# Patient Record
Sex: Female | Born: 1940 | Race: Black or African American | Hispanic: No | Smoking: Former smoker
Health system: Southern US, Community
[De-identification: ages and names within clinical notes are randomized; demographics above are authoritative.]

## PROBLEM LIST (undated history)

## (undated) DIAGNOSIS — J189 Pneumonia, unspecified organism: Secondary | ICD-10-CM

## (undated) DIAGNOSIS — E559 Vitamin D deficiency, unspecified: Secondary | ICD-10-CM

## (undated) DIAGNOSIS — D649 Anemia, unspecified: Secondary | ICD-10-CM

## (undated) DIAGNOSIS — M1990 Unspecified osteoarthritis, unspecified site: Secondary | ICD-10-CM

## (undated) DIAGNOSIS — I700 Atherosclerosis of aorta: Secondary | ICD-10-CM

## (undated) DIAGNOSIS — J302 Other seasonal allergic rhinitis: Secondary | ICD-10-CM

## (undated) DIAGNOSIS — C50919 Malignant neoplasm of unspecified site of unspecified female breast: Secondary | ICD-10-CM

## (undated) DIAGNOSIS — I10 Essential (primary) hypertension: Secondary | ICD-10-CM

## (undated) DIAGNOSIS — IMO0002 Reserved for concepts with insufficient information to code with codable children: Secondary | ICD-10-CM

## (undated) DIAGNOSIS — IMO0001 Reserved for inherently not codable concepts without codable children: Secondary | ICD-10-CM

## (undated) DIAGNOSIS — C569 Malignant neoplasm of unspecified ovary: Secondary | ICD-10-CM

## (undated) HISTORY — DX: Malignant neoplasm of unspecified site of unspecified female breast: C50.919

## (undated) HISTORY — PX: APPENDECTOMY: SHX54

## (undated) HISTORY — DX: Malignant neoplasm of unspecified ovary: C56.9

## (undated) HISTORY — DX: Vitamin D deficiency, unspecified: E55.9

## (undated) HISTORY — PX: TONSILLECTOMY: SUR1361

## (undated) HISTORY — DX: Other seasonal allergic rhinitis: J30.2

## (undated) HISTORY — PX: BLADDER SURGERY: SHX569

## (undated) HISTORY — PX: BREAST LUMPECTOMY: SHX2

## (undated) HISTORY — DX: Atherosclerosis of aorta: I70.0

---

## 1998-04-05 ENCOUNTER — Other Ambulatory Visit: Admission: RE | Admit: 1998-04-05 | Discharge: 1998-04-05 | Payer: Self-pay

## 1999-03-14 ENCOUNTER — Other Ambulatory Visit: Admission: RE | Admit: 1999-03-14 | Discharge: 1999-03-14 | Payer: Self-pay | Admitting: Internal Medicine

## 1999-06-13 ENCOUNTER — Encounter: Payer: Self-pay | Admitting: Internal Medicine

## 1999-06-13 ENCOUNTER — Ambulatory Visit (HOSPITAL_COMMUNITY): Admission: RE | Admit: 1999-06-13 | Discharge: 1999-06-13 | Payer: Self-pay | Admitting: Internal Medicine

## 2000-02-06 ENCOUNTER — Emergency Department (HOSPITAL_COMMUNITY): Admission: EM | Admit: 2000-02-06 | Discharge: 2000-02-06 | Payer: Self-pay | Admitting: Emergency Medicine

## 2000-02-07 ENCOUNTER — Encounter: Payer: Self-pay | Admitting: Emergency Medicine

## 2000-02-16 ENCOUNTER — Ambulatory Visit (HOSPITAL_COMMUNITY): Admission: RE | Admit: 2000-02-16 | Discharge: 2000-02-16 | Payer: Self-pay | Admitting: Gastroenterology

## 2000-03-13 ENCOUNTER — Other Ambulatory Visit: Admission: RE | Admit: 2000-03-13 | Discharge: 2000-03-13 | Payer: Self-pay | Admitting: Internal Medicine

## 2000-03-14 ENCOUNTER — Observation Stay (HOSPITAL_COMMUNITY): Admission: RE | Admit: 2000-03-14 | Discharge: 2000-03-15 | Payer: Self-pay | Admitting: Surgery

## 2000-03-14 ENCOUNTER — Encounter (INDEPENDENT_AMBULATORY_CARE_PROVIDER_SITE_OTHER): Payer: Self-pay | Admitting: Specialist

## 2000-03-14 ENCOUNTER — Encounter: Payer: Self-pay | Admitting: Surgery

## 2000-03-17 ENCOUNTER — Encounter: Payer: Self-pay | Admitting: Emergency Medicine

## 2000-03-17 ENCOUNTER — Inpatient Hospital Stay (HOSPITAL_COMMUNITY): Admission: EM | Admit: 2000-03-17 | Discharge: 2000-03-20 | Payer: Self-pay | Admitting: Emergency Medicine

## 2000-03-18 ENCOUNTER — Encounter: Payer: Self-pay | Admitting: Cardiovascular Disease

## 2000-03-18 ENCOUNTER — Encounter: Payer: Self-pay | Admitting: Internal Medicine

## 2000-03-19 ENCOUNTER — Encounter: Payer: Self-pay | Admitting: Internal Medicine

## 2000-05-15 ENCOUNTER — Ambulatory Visit (HOSPITAL_COMMUNITY): Admission: RE | Admit: 2000-05-15 | Discharge: 2000-05-15 | Payer: Self-pay | Admitting: Gastroenterology

## 2000-12-06 ENCOUNTER — Encounter: Payer: Self-pay | Admitting: Internal Medicine

## 2000-12-06 ENCOUNTER — Ambulatory Visit (HOSPITAL_COMMUNITY): Admission: RE | Admit: 2000-12-06 | Discharge: 2000-12-06 | Payer: Self-pay | Admitting: Internal Medicine

## 2001-04-15 ENCOUNTER — Other Ambulatory Visit: Admission: RE | Admit: 2001-04-15 | Discharge: 2001-04-15 | Payer: Self-pay | Admitting: Internal Medicine

## 2001-10-09 ENCOUNTER — Inpatient Hospital Stay (HOSPITAL_COMMUNITY): Admission: EM | Admit: 2001-10-09 | Discharge: 2001-10-12 | Payer: Self-pay | Admitting: Emergency Medicine

## 2001-10-10 ENCOUNTER — Encounter: Payer: Self-pay | Admitting: Internal Medicine

## 2002-04-03 ENCOUNTER — Ambulatory Visit (HOSPITAL_COMMUNITY): Admission: RE | Admit: 2002-04-03 | Discharge: 2002-04-03 | Payer: Self-pay | Admitting: Internal Medicine

## 2002-04-03 ENCOUNTER — Encounter: Payer: Self-pay | Admitting: Internal Medicine

## 2002-04-30 ENCOUNTER — Ambulatory Visit (HOSPITAL_COMMUNITY): Admission: RE | Admit: 2002-04-30 | Discharge: 2002-04-30 | Payer: Self-pay | Admitting: Gastroenterology

## 2002-04-30 ENCOUNTER — Encounter (INDEPENDENT_AMBULATORY_CARE_PROVIDER_SITE_OTHER): Payer: Self-pay | Admitting: Specialist

## 2004-09-18 HISTORY — PX: ABDOMINAL HYSTERECTOMY: SHX81

## 2005-03-13 ENCOUNTER — Inpatient Hospital Stay (HOSPITAL_COMMUNITY): Admission: AD | Admit: 2005-03-13 | Discharge: 2005-03-14 | Payer: Self-pay | Admitting: Internal Medicine

## 2005-03-15 ENCOUNTER — Ambulatory Visit: Payer: Self-pay | Admitting: Oncology

## 2005-04-04 ENCOUNTER — Ambulatory Visit: Admission: RE | Admit: 2005-04-04 | Discharge: 2005-04-04 | Payer: Self-pay | Admitting: Gynecology

## 2005-04-18 ENCOUNTER — Ambulatory Visit (HOSPITAL_COMMUNITY): Admission: RE | Admit: 2005-04-18 | Discharge: 2005-04-18 | Payer: Self-pay | Admitting: Surgery

## 2005-04-18 ENCOUNTER — Ambulatory Visit (HOSPITAL_BASED_OUTPATIENT_CLINIC_OR_DEPARTMENT_OTHER): Admission: RE | Admit: 2005-04-18 | Discharge: 2005-04-18 | Payer: Self-pay | Admitting: Surgery

## 2005-04-25 ENCOUNTER — Ambulatory Visit: Admission: RE | Admit: 2005-04-25 | Discharge: 2005-04-25 | Payer: Self-pay | Admitting: Gynecology

## 2005-05-02 ENCOUNTER — Ambulatory Visit: Payer: Self-pay | Admitting: Oncology

## 2005-06-19 ENCOUNTER — Ambulatory Visit: Payer: Self-pay | Admitting: Oncology

## 2005-06-30 ENCOUNTER — Ambulatory Visit: Admission: RE | Admit: 2005-06-30 | Discharge: 2005-06-30 | Payer: Self-pay | Admitting: Gynecology

## 2005-08-14 ENCOUNTER — Ambulatory Visit: Payer: Self-pay | Admitting: Oncology

## 2005-09-05 ENCOUNTER — Ambulatory Visit (HOSPITAL_COMMUNITY): Admission: RE | Admit: 2005-09-05 | Discharge: 2005-09-05 | Payer: Self-pay | Admitting: Oncology

## 2005-09-06 ENCOUNTER — Ambulatory Visit: Admission: RE | Admit: 2005-09-06 | Discharge: 2005-09-06 | Payer: Self-pay | Admitting: Gynecology

## 2005-10-04 ENCOUNTER — Ambulatory Visit (HOSPITAL_COMMUNITY): Admission: RE | Admit: 2005-10-04 | Discharge: 2005-10-04 | Payer: Self-pay | Admitting: Oncology

## 2005-10-04 ENCOUNTER — Ambulatory Visit: Payer: Self-pay | Admitting: Oncology

## 2005-12-22 ENCOUNTER — Ambulatory Visit: Admission: RE | Admit: 2005-12-22 | Discharge: 2005-12-22 | Payer: Self-pay | Admitting: Gynecology

## 2005-12-26 ENCOUNTER — Ambulatory Visit: Payer: Self-pay | Admitting: Oncology

## 2005-12-27 LAB — COMPREHENSIVE METABOLIC PANEL
ALT: 8 U/L (ref 0–40)
AST: 13 U/L (ref 0–37)
Albumin: 4 g/dL (ref 3.5–5.2)
Alkaline Phosphatase: 40 U/L (ref 39–117)
BUN: 14 mg/dL (ref 6–23)
CO2: 25 mEq/L (ref 19–32)
Calcium: 8.4 mg/dL (ref 8.4–10.5)
Chloride: 109 mEq/L (ref 96–112)
Creatinine, Ser: 0.8 mg/dL (ref 0.4–1.2)
Glucose, Bld: 94 mg/dL (ref 70–99)
Potassium: 3.4 mEq/L — ABNORMAL LOW (ref 3.5–5.3)
Sodium: 142 mEq/L (ref 135–145)
Total Bilirubin: 0.5 mg/dL (ref 0.3–1.2)
Total Protein: 6.9 g/dL (ref 6.0–8.3)

## 2005-12-27 LAB — CBC WITH DIFFERENTIAL/PLATELET
BASO%: 0.5 % (ref 0.0–2.0)
Basophils Absolute: 0 10*3/uL (ref 0.0–0.1)
EOS%: 1.1 % (ref 0.0–7.0)
Eosinophils Absolute: 0.1 10*3/uL (ref 0.0–0.5)
HCT: 33.6 % — ABNORMAL LOW (ref 34.8–46.6)
HGB: 11.5 g/dL — ABNORMAL LOW (ref 11.6–15.9)
LYMPH%: 33.2 % (ref 14.0–48.0)
MCH: 33.6 pg (ref 26.0–34.0)
MCHC: 34.2 g/dL (ref 32.0–36.0)
MCV: 98.1 fL (ref 81.0–101.0)
MONO#: 0.3 10*3/uL (ref 0.1–0.9)
MONO%: 5.3 % (ref 0.0–13.0)
NEUT#: 3 10*3/uL (ref 1.5–6.5)
NEUT%: 59.9 % (ref 39.6–76.8)
Platelets: 131 10*3/uL — ABNORMAL LOW (ref 145–400)
RBC: 3.43 10*6/uL — ABNORMAL LOW (ref 3.70–5.32)
RDW: 13 % (ref 11.3–14.5)
WBC: 5.1 10*3/uL (ref 3.9–10.0)
lymph#: 1.7 10*3/uL (ref 0.9–3.3)

## 2005-12-27 LAB — CA 125: CA 125: 7.7 U/mL (ref 0.0–30.2)

## 2006-02-08 ENCOUNTER — Ambulatory Visit: Payer: Self-pay | Admitting: Oncology

## 2006-02-13 LAB — COMPREHENSIVE METABOLIC PANEL
ALT: 10 U/L (ref 0–40)
AST: 15 U/L (ref 0–37)
Albumin: 4.1 g/dL (ref 3.5–5.2)
Alkaline Phosphatase: 43 U/L (ref 39–117)
BUN: 11 mg/dL (ref 6–23)
CO2: 25 mEq/L (ref 19–32)
Calcium: 8.7 mg/dL (ref 8.4–10.5)
Chloride: 105 mEq/L (ref 96–112)
Creatinine, Ser: 0.7 mg/dL (ref 0.4–1.2)
Glucose, Bld: 95 mg/dL (ref 70–99)
Potassium: 3.4 mEq/L — ABNORMAL LOW (ref 3.5–5.3)
Sodium: 139 mEq/L (ref 135–145)
Total Bilirubin: 0.5 mg/dL (ref 0.3–1.2)
Total Protein: 7.3 g/dL (ref 6.0–8.3)

## 2006-02-13 LAB — CBC WITH DIFFERENTIAL/PLATELET
BASO%: 0.7 % (ref 0.0–2.0)
Basophils Absolute: 0 10*3/uL (ref 0.0–0.1)
EOS%: 1 % (ref 0.0–7.0)
Eosinophils Absolute: 0.1 10*3/uL (ref 0.0–0.5)
HCT: 35 % (ref 34.8–46.6)
HGB: 12.2 g/dL (ref 11.6–15.9)
LYMPH%: 32.9 % (ref 14.0–48.0)
MCH: 34 pg (ref 26.0–34.0)
MCHC: 34.9 g/dL (ref 32.0–36.0)
MCV: 97.3 fL (ref 81.0–101.0)
MONO#: 0.4 10*3/uL (ref 0.1–0.9)
MONO%: 6.8 % (ref 0.0–13.0)
NEUT#: 3.2 10*3/uL (ref 1.5–6.5)
NEUT%: 58.6 % (ref 39.6–76.8)
Platelets: 127 10*3/uL — ABNORMAL LOW (ref 145–400)
RBC: 3.6 10*6/uL — ABNORMAL LOW (ref 3.70–5.32)
RDW: 13.1 % (ref 11.3–14.5)
WBC: 5.4 10*3/uL (ref 3.9–10.0)
lymph#: 1.8 10*3/uL (ref 0.9–3.3)

## 2006-02-13 LAB — CA 125: CA 125: 7.5 U/mL (ref 0.0–30.2)

## 2006-04-04 ENCOUNTER — Ambulatory Visit: Payer: Self-pay | Admitting: Oncology

## 2006-04-09 LAB — COMPREHENSIVE METABOLIC PANEL
ALT: 9 U/L (ref 0–40)
AST: 12 U/L (ref 0–37)
Albumin: 4 g/dL (ref 3.5–5.2)
Alkaline Phosphatase: 41 U/L (ref 39–117)
BUN: 15 mg/dL (ref 6–23)
CO2: 26 mEq/L (ref 19–32)
Calcium: 8.7 mg/dL (ref 8.4–10.5)
Chloride: 106 mEq/L (ref 96–112)
Creatinine, Ser: 0.75 mg/dL (ref 0.40–1.20)
Glucose, Bld: 85 mg/dL (ref 70–99)
Potassium: 3.7 mEq/L (ref 3.5–5.3)
Sodium: 143 mEq/L (ref 135–145)
Total Bilirubin: 0.5 mg/dL (ref 0.3–1.2)
Total Protein: 7.2 g/dL (ref 6.0–8.3)

## 2006-04-09 LAB — CBC WITH DIFFERENTIAL/PLATELET
BASO%: 0.5 % (ref 0.0–2.0)
Basophils Absolute: 0 10*3/uL (ref 0.0–0.1)
EOS%: 0.7 % (ref 0.0–7.0)
Eosinophils Absolute: 0 10*3/uL (ref 0.0–0.5)
HCT: 34.3 % — ABNORMAL LOW (ref 34.8–46.6)
HGB: 12 g/dL (ref 11.6–15.9)
LYMPH%: 31.7 % (ref 14.0–48.0)
MCH: 33.8 pg (ref 26.0–34.0)
MCHC: 35 g/dL (ref 32.0–36.0)
MCV: 96.6 fL (ref 81.0–101.0)
MONO#: 0.4 10*3/uL (ref 0.1–0.9)
MONO%: 6.7 % (ref 0.0–13.0)
NEUT#: 3.5 10*3/uL (ref 1.5–6.5)
NEUT%: 60.4 % (ref 39.6–76.8)
Platelets: 141 10*3/uL — ABNORMAL LOW (ref 145–400)
RBC: 3.56 10*6/uL — ABNORMAL LOW (ref 3.70–5.32)
RDW: 12.7 % (ref 11.3–14.5)
WBC: 5.7 10*3/uL (ref 3.9–10.0)
lymph#: 1.8 10*3/uL (ref 0.9–3.3)

## 2006-04-09 LAB — CA 125: CA 125: 8.5 U/mL (ref 0.0–30.2)

## 2006-07-06 ENCOUNTER — Ambulatory Visit: Payer: Self-pay | Admitting: Oncology

## 2006-07-13 ENCOUNTER — Ambulatory Visit: Admission: RE | Admit: 2006-07-13 | Discharge: 2006-07-13 | Payer: Self-pay | Admitting: Gynecology

## 2006-08-31 ENCOUNTER — Ambulatory Visit: Payer: Self-pay | Admitting: Oncology

## 2006-10-02 LAB — CBC WITH DIFFERENTIAL/PLATELET
BASO%: 1.1 % (ref 0.0–2.0)
Basophils Absolute: 0.1 10*3/uL (ref 0.0–0.1)
EOS%: 1.6 % (ref 0.0–7.0)
Eosinophils Absolute: 0.1 10*3/uL (ref 0.0–0.5)
HCT: 35.7 % (ref 34.8–46.6)
HGB: 12.5 g/dL (ref 11.6–15.9)
LYMPH%: 38.9 % (ref 14.0–48.0)
MCH: 33.1 pg (ref 26.0–34.0)
MCHC: 34.9 g/dL (ref 32.0–36.0)
MCV: 94.8 fL (ref 81.0–101.0)
MONO#: 0.4 10*3/uL (ref 0.1–0.9)
MONO%: 5.9 % (ref 0.0–13.0)
NEUT#: 3.1 10*3/uL (ref 1.5–6.5)
NEUT%: 52.5 % (ref 39.6–76.8)
Platelets: 155 10*3/uL (ref 145–400)
RBC: 3.76 10*6/uL (ref 3.70–5.32)
RDW: 11.6 % (ref 11.3–14.5)
WBC: 6 10*3/uL (ref 3.9–10.0)
lymph#: 2.3 10*3/uL (ref 0.9–3.3)

## 2006-10-02 LAB — COMPREHENSIVE METABOLIC PANEL
ALT: 10 U/L (ref 0–35)
AST: 12 U/L (ref 0–37)
Albumin: 3.9 g/dL (ref 3.5–5.2)
Alkaline Phosphatase: 46 U/L (ref 39–117)
BUN: 13 mg/dL (ref 6–23)
CO2: 26 mEq/L (ref 19–32)
Calcium: 9 mg/dL (ref 8.4–10.5)
Chloride: 107 mEq/L (ref 96–112)
Creatinine, Ser: 0.8 mg/dL (ref 0.40–1.20)
Glucose, Bld: 102 mg/dL — ABNORMAL HIGH (ref 70–99)
Potassium: 3.4 mEq/L — ABNORMAL LOW (ref 3.5–5.3)
Sodium: 141 mEq/L (ref 135–145)
Total Bilirubin: 0.5 mg/dL (ref 0.3–1.2)
Total Protein: 7.3 g/dL (ref 6.0–8.3)

## 2006-10-02 LAB — CA 125: CA 125: 9.5 U/mL (ref 0.0–30.2)

## 2006-10-19 ENCOUNTER — Encounter: Admission: RE | Admit: 2006-10-19 | Discharge: 2006-10-19 | Payer: Self-pay | Admitting: Internal Medicine

## 2006-11-22 ENCOUNTER — Ambulatory Visit: Payer: Self-pay | Admitting: Oncology

## 2007-01-07 ENCOUNTER — Ambulatory Visit: Payer: Self-pay | Admitting: Oncology

## 2007-01-08 LAB — CBC WITH DIFFERENTIAL/PLATELET
BASO%: 1 % (ref 0.0–2.0)
Basophils Absolute: 0.1 10*3/uL (ref 0.0–0.1)
EOS%: 1.8 % (ref 0.0–7.0)
Eosinophils Absolute: 0.1 10*3/uL (ref 0.0–0.5)
HCT: 34.6 % — ABNORMAL LOW (ref 34.8–46.6)
HGB: 12.3 g/dL (ref 11.6–15.9)
LYMPH%: 33.1 % (ref 14.0–48.0)
MCH: 33.2 pg (ref 26.0–34.0)
MCHC: 35.5 g/dL (ref 32.0–36.0)
MCV: 93.5 fL (ref 81.0–101.0)
MONO#: 0.4 10*3/uL (ref 0.1–0.9)
MONO%: 5.8 % (ref 0.0–13.0)
NEUT#: 3.8 10*3/uL (ref 1.5–6.5)
NEUT%: 58.2 % (ref 39.6–76.8)
Platelets: 156 10*3/uL (ref 145–400)
RBC: 3.7 10*6/uL (ref 3.70–5.32)
RDW: 11.8 % (ref 11.3–14.5)
WBC: 6.6 10*3/uL (ref 3.9–10.0)
lymph#: 2.2 10*3/uL (ref 0.9–3.3)

## 2007-01-08 LAB — COMPREHENSIVE METABOLIC PANEL
ALT: 10 U/L (ref 0–35)
AST: 12 U/L (ref 0–37)
Albumin: 4 g/dL (ref 3.5–5.2)
Alkaline Phosphatase: 47 U/L (ref 39–117)
BUN: 15 mg/dL (ref 6–23)
CO2: 24 mEq/L (ref 19–32)
Calcium: 8.8 mg/dL (ref 8.4–10.5)
Chloride: 107 mEq/L (ref 96–112)
Creatinine, Ser: 0.83 mg/dL (ref 0.40–1.20)
Glucose, Bld: 105 mg/dL — ABNORMAL HIGH (ref 70–99)
Potassium: 3.4 mEq/L — ABNORMAL LOW (ref 3.5–5.3)
Sodium: 141 mEq/L (ref 135–145)
Total Bilirubin: 0.5 mg/dL (ref 0.3–1.2)
Total Protein: 7.2 g/dL (ref 6.0–8.3)

## 2007-01-08 LAB — CA 125: CA 125: 7.9 U/mL (ref 0.0–30.2)

## 2007-02-08 ENCOUNTER — Ambulatory Visit: Admission: RE | Admit: 2007-02-08 | Discharge: 2007-02-08 | Payer: Self-pay | Admitting: Gynecology

## 2007-03-05 ENCOUNTER — Ambulatory Visit: Payer: Self-pay | Admitting: Oncology

## 2007-08-09 ENCOUNTER — Ambulatory Visit: Payer: Self-pay | Admitting: Oncology

## 2007-09-17 LAB — CBC WITH DIFFERENTIAL/PLATELET
BASO%: 1 % (ref 0.0–2.0)
Basophils Absolute: 0.1 10*3/uL (ref 0.0–0.1)
EOS%: 1.2 % (ref 0.0–7.0)
Eosinophils Absolute: 0.1 10*3/uL (ref 0.0–0.5)
HCT: 33.8 % — ABNORMAL LOW (ref 34.8–46.6)
HGB: 11.9 g/dL (ref 11.6–15.9)
LYMPH%: 39 % (ref 14.0–48.0)
MCH: 32.6 pg (ref 26.0–34.0)
MCHC: 35.4 g/dL (ref 32.0–36.0)
MCV: 92 fL (ref 81.0–101.0)
MONO#: 0.4 10*3/uL (ref 0.1–0.9)
MONO%: 6.2 % (ref 0.0–13.0)
NEUT#: 3.3 10*3/uL (ref 1.5–6.5)
NEUT%: 52.6 % (ref 39.6–76.8)
Platelets: 133 10*3/uL — ABNORMAL LOW (ref 145–400)
RBC: 3.67 10*6/uL — ABNORMAL LOW (ref 3.70–5.32)
RDW: 10.9 % — ABNORMAL LOW (ref 11.3–14.5)
WBC: 6.3 10*3/uL (ref 3.9–10.0)
lymph#: 2.5 10*3/uL (ref 0.9–3.3)

## 2007-09-17 LAB — COMPREHENSIVE METABOLIC PANEL
ALT: 8 U/L (ref 0–35)
AST: 13 U/L (ref 0–37)
Albumin: 3.8 g/dL (ref 3.5–5.2)
Alkaline Phosphatase: 52 U/L (ref 39–117)
BUN: 19 mg/dL (ref 6–23)
CO2: 21 mEq/L (ref 19–32)
Calcium: 8.7 mg/dL (ref 8.4–10.5)
Chloride: 109 mEq/L (ref 96–112)
Creatinine, Ser: 0.81 mg/dL (ref 0.40–1.20)
Glucose, Bld: 112 mg/dL — ABNORMAL HIGH (ref 70–99)
Potassium: 3.5 mEq/L (ref 3.5–5.3)
Sodium: 143 mEq/L (ref 135–145)
Total Bilirubin: 0.3 mg/dL (ref 0.3–1.2)
Total Protein: 7.2 g/dL (ref 6.0–8.3)

## 2007-09-17 LAB — CA 125: CA 125: 10.4 U/mL (ref 0.0–30.2)

## 2007-09-18 ENCOUNTER — Ambulatory Visit: Payer: Self-pay | Admitting: Oncology

## 2007-11-07 ENCOUNTER — Ambulatory Visit: Payer: Self-pay | Admitting: Oncology

## 2007-11-12 LAB — CBC WITH DIFFERENTIAL/PLATELET
BASO%: 0.4 % (ref 0.0–2.0)
Basophils Absolute: 0 10*3/uL (ref 0.0–0.1)
EOS%: 2 % (ref 0.0–7.0)
Eosinophils Absolute: 0.1 10*3/uL (ref 0.0–0.5)
HCT: 36 % (ref 34.8–46.6)
HGB: 12.6 g/dL (ref 11.6–15.9)
LYMPH%: 29.3 % (ref 14.0–48.0)
MCH: 32.6 pg (ref 26.0–34.0)
MCHC: 34.9 g/dL (ref 32.0–36.0)
MCV: 93.5 fL (ref 81.0–101.0)
MONO#: 0.3 10*3/uL (ref 0.1–0.9)
MONO%: 5.7 % (ref 0.0–13.0)
NEUT#: 3.6 10*3/uL (ref 1.5–6.5)
NEUT%: 62.6 % (ref 39.6–76.8)
Platelets: 124 10*3/uL — ABNORMAL LOW (ref 145–400)
RBC: 3.85 10*6/uL (ref 3.70–5.32)
RDW: 13.4 % (ref 11.3–14.5)
WBC: 5.7 10*3/uL (ref 3.9–10.0)
lymph#: 1.7 10*3/uL (ref 0.9–3.3)

## 2008-01-02 ENCOUNTER — Ambulatory Visit: Payer: Self-pay | Admitting: Oncology

## 2008-01-13 LAB — CBC WITH DIFFERENTIAL/PLATELET
BASO%: 0.8 % (ref 0.0–2.0)
Basophils Absolute: 0 10*3/uL (ref 0.0–0.1)
EOS%: 1.4 % (ref 0.0–7.0)
Eosinophils Absolute: 0.1 10*3/uL (ref 0.0–0.5)
HCT: 37.1 % (ref 34.8–46.6)
HGB: 12.5 g/dL (ref 11.6–15.9)
LYMPH%: 36.9 % (ref 14.0–48.0)
MCH: 31.7 pg (ref 26.0–34.0)
MCHC: 33.7 g/dL (ref 32.0–36.0)
MCV: 94.1 fL (ref 81.0–101.0)
MONO#: 0.4 10*3/uL (ref 0.1–0.9)
MONO%: 7.1 % (ref 0.0–13.0)
NEUT#: 3.3 10*3/uL (ref 1.5–6.5)
NEUT%: 53.9 % (ref 39.6–76.8)
Platelets: 130 10*3/uL — ABNORMAL LOW (ref 145–400)
RBC: 3.94 10*6/uL (ref 3.70–5.32)
RDW: 12.8 % (ref 11.3–14.5)
WBC: 6.1 10*3/uL (ref 3.9–10.0)
lymph#: 2.3 10*3/uL (ref 0.9–3.3)

## 2008-02-27 ENCOUNTER — Ambulatory Visit: Payer: Self-pay | Admitting: Oncology

## 2008-03-06 ENCOUNTER — Ambulatory Visit: Admission: RE | Admit: 2008-03-06 | Discharge: 2008-03-06 | Payer: Self-pay | Admitting: Gynecology

## 2008-03-06 LAB — CBC WITH DIFFERENTIAL/PLATELET
BASO%: 0.4 % (ref 0.0–2.0)
Basophils Absolute: 0 10*3/uL (ref 0.0–0.1)
EOS%: 1.7 % (ref 0.0–7.0)
Eosinophils Absolute: 0.1 10*3/uL (ref 0.0–0.5)
HCT: 35.2 % (ref 34.8–46.6)
HGB: 12.4 g/dL (ref 11.6–15.9)
LYMPH%: 30.9 % (ref 14.0–48.0)
MCH: 32.5 pg (ref 26.0–34.0)
MCHC: 35.1 g/dL (ref 32.0–36.0)
MCV: 92.6 fL (ref 81.0–101.0)
MONO#: 0.4 10*3/uL (ref 0.1–0.9)
MONO%: 5.9 % (ref 0.0–13.0)
NEUT#: 3.6 10*3/uL (ref 1.5–6.5)
NEUT%: 61.1 % (ref 39.6–76.8)
Platelets: 128 10*3/uL — ABNORMAL LOW (ref 145–400)
RBC: 3.8 10*6/uL (ref 3.70–5.32)
RDW: 13.4 % (ref 11.3–14.5)
WBC: 6 10*3/uL (ref 3.9–10.0)
lymph#: 1.8 10*3/uL (ref 0.9–3.3)

## 2008-03-06 LAB — COMPREHENSIVE METABOLIC PANEL
ALT: 9 U/L (ref 0–35)
AST: 14 U/L (ref 0–37)
Albumin: 4 g/dL (ref 3.5–5.2)
Alkaline Phosphatase: 55 U/L (ref 39–117)
BUN: 11 mg/dL (ref 6–23)
CO2: 22 mEq/L (ref 19–32)
Calcium: 8.7 mg/dL (ref 8.4–10.5)
Chloride: 108 mEq/L (ref 96–112)
Creatinine, Ser: 0.68 mg/dL (ref 0.40–1.20)
Glucose, Bld: 99 mg/dL (ref 70–99)
Potassium: 3.8 mEq/L (ref 3.5–5.3)
Sodium: 141 mEq/L (ref 135–145)
Total Bilirubin: 0.6 mg/dL (ref 0.3–1.2)
Total Protein: 7.4 g/dL (ref 6.0–8.3)

## 2008-03-06 LAB — CA 125: CA 125: 12.2 U/mL (ref 0.0–30.2)

## 2008-04-09 ENCOUNTER — Ambulatory Visit: Payer: Self-pay | Admitting: Oncology

## 2008-04-28 ENCOUNTER — Encounter: Admission: RE | Admit: 2008-04-28 | Discharge: 2008-04-28 | Payer: Self-pay | Admitting: Internal Medicine

## 2008-06-05 ENCOUNTER — Ambulatory Visit: Payer: Self-pay | Admitting: Oncology

## 2008-07-24 ENCOUNTER — Ambulatory Visit: Payer: Self-pay | Admitting: Oncology

## 2008-07-28 LAB — CBC WITH DIFFERENTIAL/PLATELET
BASO%: 0.4 % (ref 0.0–2.0)
Basophils Absolute: 0 10*3/uL (ref 0.0–0.1)
EOS%: 1.3 % (ref 0.0–7.0)
Eosinophils Absolute: 0.1 10*3/uL (ref 0.0–0.5)
HCT: 35.8 % (ref 34.8–46.6)
HGB: 12.6 g/dL (ref 11.6–15.9)
LYMPH%: 32.7 % (ref 14.0–48.0)
MCH: 33 pg (ref 26.0–34.0)
MCHC: 35.2 g/dL (ref 32.0–36.0)
MCV: 93.6 fL (ref 81.0–101.0)
MONO#: 0.4 10*3/uL (ref 0.1–0.9)
MONO%: 6.3 % (ref 0.0–13.0)
NEUT#: 3.6 10*3/uL (ref 1.5–6.5)
NEUT%: 59.3 % (ref 39.6–76.8)
Platelets: 150 10*3/uL (ref 145–400)
RBC: 3.83 10*6/uL (ref 3.70–5.32)
RDW: 12.8 % (ref 11.3–14.5)
WBC: 6.1 10*3/uL (ref 3.9–10.0)
lymph#: 2 10*3/uL (ref 0.9–3.3)

## 2008-07-28 LAB — COMPREHENSIVE METABOLIC PANEL
ALT: 9 U/L (ref 0–35)
AST: 13 U/L (ref 0–37)
Albumin: 3.9 g/dL (ref 3.5–5.2)
Alkaline Phosphatase: 53 U/L (ref 39–117)
BUN: 15 mg/dL (ref 6–23)
CO2: 21 mEq/L (ref 19–32)
Calcium: 8.7 mg/dL (ref 8.4–10.5)
Chloride: 108 mEq/L (ref 96–112)
Creatinine, Ser: 0.97 mg/dL (ref 0.40–1.20)
Glucose, Bld: 96 mg/dL (ref 70–99)
Potassium: 3.6 mEq/L (ref 3.5–5.3)
Sodium: 143 mEq/L (ref 135–145)
Total Bilirubin: 0.6 mg/dL (ref 0.3–1.2)
Total Protein: 7.5 g/dL (ref 6.0–8.3)

## 2008-07-28 LAB — CA 125: CA 125: 8 U/mL (ref 0.0–30.2)

## 2008-07-28 LAB — TECHNOLOGIST REVIEW

## 2008-08-26 ENCOUNTER — Ambulatory Visit (HOSPITAL_COMMUNITY): Admission: RE | Admit: 2008-08-26 | Discharge: 2008-08-26 | Payer: Self-pay | Admitting: Oncology

## 2008-09-21 ENCOUNTER — Ambulatory Visit: Payer: Self-pay | Admitting: Oncology

## 2008-11-04 ENCOUNTER — Encounter: Admission: RE | Admit: 2008-11-04 | Discharge: 2008-11-04 | Payer: Self-pay | Admitting: Radiology

## 2008-11-16 ENCOUNTER — Ambulatory Visit: Payer: Self-pay | Admitting: Oncology

## 2008-11-25 ENCOUNTER — Ambulatory Visit (HOSPITAL_COMMUNITY): Admission: RE | Admit: 2008-11-25 | Discharge: 2008-11-25 | Payer: Self-pay | Admitting: General Surgery

## 2008-11-25 ENCOUNTER — Encounter (INDEPENDENT_AMBULATORY_CARE_PROVIDER_SITE_OTHER): Payer: Self-pay | Admitting: General Surgery

## 2008-12-07 LAB — CBC WITH DIFFERENTIAL/PLATELET
BASO%: 0.3 % (ref 0.0–2.0)
Basophils Absolute: 0 10*3/uL (ref 0.0–0.1)
EOS%: 2.8 % (ref 0.0–7.0)
Eosinophils Absolute: 0.2 10*3/uL (ref 0.0–0.5)
HCT: 38.4 % (ref 34.8–46.6)
HGB: 13.1 g/dL (ref 11.6–15.9)
LYMPH%: 32.1 % (ref 14.0–49.7)
MCH: 31.3 pg (ref 25.1–34.0)
MCHC: 34.1 g/dL (ref 31.5–36.0)
MCV: 91.9 fL (ref 79.5–101.0)
MONO#: 0.4 10*3/uL (ref 0.1–0.9)
MONO%: 6.6 % (ref 0.0–14.0)
NEUT#: 3.6 10*3/uL (ref 1.5–6.5)
NEUT%: 58.2 % (ref 38.4–76.8)
Platelets: 117 10*3/uL — ABNORMAL LOW (ref 145–400)
RBC: 4.18 10*6/uL (ref 3.70–5.45)
RDW: 13.7 % (ref 11.2–14.5)
WBC: 6.2 10*3/uL (ref 3.9–10.3)
lymph#: 2 10*3/uL (ref 0.9–3.3)
nRBC: 0 % (ref 0–0)

## 2008-12-10 ENCOUNTER — Encounter (INDEPENDENT_AMBULATORY_CARE_PROVIDER_SITE_OTHER): Payer: Self-pay | Admitting: General Surgery

## 2008-12-10 ENCOUNTER — Ambulatory Visit (HOSPITAL_BASED_OUTPATIENT_CLINIC_OR_DEPARTMENT_OTHER): Admission: RE | Admit: 2008-12-10 | Discharge: 2008-12-10 | Payer: Self-pay | Admitting: General Surgery

## 2008-12-21 ENCOUNTER — Ambulatory Visit (HOSPITAL_COMMUNITY): Admission: RE | Admit: 2008-12-21 | Discharge: 2008-12-21 | Payer: Self-pay | Admitting: Oncology

## 2008-12-25 LAB — MORPHOLOGY: PLT EST: DECREASED

## 2008-12-25 LAB — CBC WITH DIFFERENTIAL/PLATELET
BASO%: 0.2 % (ref 0.0–2.0)
Basophils Absolute: 0 10*3/uL (ref 0.0–0.1)
EOS%: 1.2 % (ref 0.0–7.0)
Eosinophils Absolute: 0.1 10*3/uL (ref 0.0–0.5)
HCT: 34.2 % — ABNORMAL LOW (ref 34.8–46.6)
HGB: 11.9 g/dL (ref 11.6–15.9)
LYMPH%: 16.7 % (ref 14.0–49.7)
MCH: 32.6 pg (ref 25.1–34.0)
MCHC: 34.9 g/dL (ref 31.5–36.0)
MCV: 93.4 fL (ref 79.5–101.0)
MONO#: 0.4 10*3/uL (ref 0.1–0.9)
MONO%: 3.9 % (ref 0.0–14.0)
NEUT#: 8.3 10*3/uL — ABNORMAL HIGH (ref 1.5–6.5)
NEUT%: 78 % — ABNORMAL HIGH (ref 38.4–76.8)
Platelets: 122 10*3/uL — ABNORMAL LOW (ref 145–400)
RBC: 3.66 10*6/uL — ABNORMAL LOW (ref 3.70–5.45)
RDW: 13.6 % (ref 11.2–14.5)
WBC: 10.6 10*3/uL — ABNORMAL HIGH (ref 3.9–10.3)
lymph#: 1.8 10*3/uL (ref 0.9–3.3)

## 2008-12-27 LAB — COMPREHENSIVE METABOLIC PANEL
ALT: 8 U/L (ref 0–35)
AST: 11 U/L (ref 0–37)
Albumin: 3.7 g/dL (ref 3.5–5.2)
Alkaline Phosphatase: 58 U/L (ref 39–117)
BUN: 10 mg/dL (ref 6–23)
CO2: 23 mEq/L (ref 19–32)
Calcium: 8.6 mg/dL (ref 8.4–10.5)
Chloride: 107 mEq/L (ref 96–112)
Creatinine, Ser: 0.69 mg/dL (ref 0.40–1.20)
Glucose, Bld: 112 mg/dL — ABNORMAL HIGH (ref 70–99)
Potassium: 3.5 mEq/L (ref 3.5–5.3)
Sodium: 140 mEq/L (ref 135–145)
Total Bilirubin: 0.4 mg/dL (ref 0.3–1.2)
Total Protein: 6.8 g/dL (ref 6.0–8.3)

## 2008-12-27 LAB — CANCER ANTIGEN 27.29: CA 27.29: 27 U/mL (ref 0–39)

## 2008-12-27 LAB — PLATELET ANTIBODIES, DIRECT: Platelet IgG Ab, Direct: NEGATIVE

## 2008-12-29 ENCOUNTER — Ambulatory Visit: Admission: RE | Admit: 2008-12-29 | Discharge: 2009-02-14 | Payer: Self-pay | Admitting: Radiation Oncology

## 2009-01-25 ENCOUNTER — Ambulatory Visit: Payer: Self-pay | Admitting: Oncology

## 2009-01-27 LAB — MORPHOLOGY: PLT EST: DECREASED

## 2009-01-27 LAB — CBC WITH DIFFERENTIAL/PLATELET
BASO%: 0.4 % (ref 0.0–2.0)
Basophils Absolute: 0 10*3/uL (ref 0.0–0.1)
EOS%: 2.6 % (ref 0.0–7.0)
Eosinophils Absolute: 0.1 10*3/uL (ref 0.0–0.5)
HCT: 35.2 % (ref 34.8–46.6)
HGB: 12 g/dL (ref 11.6–15.9)
LYMPH%: 24.8 % (ref 14.0–49.7)
MCH: 31.3 pg (ref 25.1–34.0)
MCHC: 34.1 g/dL (ref 31.5–36.0)
MCV: 91.9 fL (ref 79.5–101.0)
MONO#: 0.5 10*3/uL (ref 0.1–0.9)
MONO%: 8.9 % (ref 0.0–14.0)
NEUT#: 3.5 10*3/uL (ref 1.5–6.5)
NEUT%: 63.3 % (ref 38.4–76.8)
Platelets: 107 10*3/uL — ABNORMAL LOW (ref 145–400)
RBC: 3.83 10*6/uL (ref 3.70–5.45)
RDW: 14.3 % (ref 11.2–14.5)
WBC: 5.5 10*3/uL (ref 3.9–10.3)
lymph#: 1.4 10*3/uL (ref 0.9–3.3)
nRBC: 0 % (ref 0–0)

## 2009-02-18 LAB — MORPHOLOGY: PLT EST: DECREASED

## 2009-02-18 LAB — CBC WITH DIFFERENTIAL/PLATELET
BASO%: 0.4 % (ref 0.0–2.0)
Basophils Absolute: 0 10*3/uL (ref 0.0–0.1)
EOS%: 2.5 % (ref 0.0–7.0)
Eosinophils Absolute: 0.1 10*3/uL (ref 0.0–0.5)
HCT: 33.8 % — ABNORMAL LOW (ref 34.8–46.6)
HGB: 11.6 g/dL (ref 11.6–15.9)
LYMPH%: 23.4 % (ref 14.0–49.7)
MCH: 31.5 pg (ref 25.1–34.0)
MCHC: 34.3 g/dL (ref 31.5–36.0)
MCV: 91.8 fL (ref 79.5–101.0)
MONO#: 0.4 10*3/uL (ref 0.1–0.9)
MONO%: 8.5 % (ref 0.0–14.0)
NEUT#: 2.9 10*3/uL (ref 1.5–6.5)
NEUT%: 65.2 % (ref 38.4–76.8)
Platelets: 111 10*3/uL — ABNORMAL LOW (ref 145–400)
RBC: 3.68 10*6/uL — ABNORMAL LOW (ref 3.70–5.45)
RDW: 14.3 % (ref 11.2–14.5)
WBC: 4.5 10*3/uL (ref 3.9–10.3)
lymph#: 1.1 10*3/uL (ref 0.9–3.3)

## 2009-02-18 LAB — COMPREHENSIVE METABOLIC PANEL
ALT: 10 U/L (ref 0–35)
AST: 13 U/L (ref 0–37)
Albumin: 3.7 g/dL (ref 3.5–5.2)
Alkaline Phosphatase: 49 U/L (ref 39–117)
BUN: 16 mg/dL (ref 6–23)
CO2: 26 mEq/L (ref 19–32)
Calcium: 8.6 mg/dL (ref 8.4–10.5)
Chloride: 110 mEq/L (ref 96–112)
Creatinine, Ser: 0.88 mg/dL (ref 0.40–1.20)
Glucose, Bld: 99 mg/dL (ref 70–99)
Potassium: 3.4 mEq/L — ABNORMAL LOW (ref 3.5–5.3)
Sodium: 142 mEq/L (ref 135–145)
Total Bilirubin: 0.5 mg/dL (ref 0.3–1.2)
Total Protein: 7.1 g/dL (ref 6.0–8.3)

## 2009-02-18 LAB — CA 125: CA 125: 8.1 U/mL (ref 0.0–30.2)

## 2009-03-03 ENCOUNTER — Ambulatory Visit: Admission: RE | Admit: 2009-03-03 | Discharge: 2009-03-03 | Payer: Self-pay | Admitting: Gynecology

## 2009-04-12 ENCOUNTER — Ambulatory Visit: Payer: Self-pay | Admitting: Oncology

## 2009-06-04 ENCOUNTER — Ambulatory Visit: Payer: Self-pay | Admitting: Oncology

## 2009-06-08 LAB — COMPREHENSIVE METABOLIC PANEL
ALT: 11 U/L (ref 0–35)
AST: 14 U/L (ref 0–37)
Albumin: 3.8 g/dL (ref 3.5–5.2)
Alkaline Phosphatase: 58 U/L (ref 39–117)
BUN: 14 mg/dL (ref 6–23)
CO2: 24 mEq/L (ref 19–32)
Calcium: 9 mg/dL (ref 8.4–10.5)
Chloride: 108 mEq/L (ref 96–112)
Creatinine, Ser: 0.77 mg/dL (ref 0.40–1.20)
Glucose, Bld: 96 mg/dL (ref 70–99)
Potassium: 3.7 mEq/L (ref 3.5–5.3)
Sodium: 143 mEq/L (ref 135–145)
Total Bilirubin: 0.6 mg/dL (ref 0.3–1.2)
Total Protein: 7.1 g/dL (ref 6.0–8.3)

## 2009-06-08 LAB — MORPHOLOGY
PLT EST: DECREASED
RBC Comments: NORMAL

## 2009-06-08 LAB — CBC WITH DIFFERENTIAL/PLATELET
BASO%: 0.3 % (ref 0.0–2.0)
Basophils Absolute: 0 10*3/uL (ref 0.0–0.1)
EOS%: 1.8 % (ref 0.0–7.0)
Eosinophils Absolute: 0.1 10*3/uL (ref 0.0–0.5)
HCT: 35.7 % (ref 34.8–46.6)
HGB: 12.4 g/dL (ref 11.6–15.9)
LYMPH%: 21.7 % (ref 14.0–49.7)
MCH: 33.5 pg (ref 25.1–34.0)
MCHC: 34.6 g/dL (ref 31.5–36.0)
MCV: 96.6 fL (ref 79.5–101.0)
MONO#: 0.3 10*3/uL (ref 0.1–0.9)
MONO%: 7.7 % (ref 0.0–14.0)
NEUT#: 3 10*3/uL (ref 1.5–6.5)
NEUT%: 68.5 % (ref 38.4–76.8)
Platelets: 123 10*3/uL — ABNORMAL LOW (ref 145–400)
RBC: 3.7 10*6/uL (ref 3.70–5.45)
RDW: 13.3 % (ref 11.2–14.5)
WBC: 4.4 10*3/uL (ref 3.9–10.3)
lymph#: 1 10*3/uL (ref 0.9–3.3)

## 2009-06-08 LAB — CA 125: CA 125: 8.4 U/mL (ref 0.0–30.2)

## 2009-06-15 ENCOUNTER — Encounter: Admission: RE | Admit: 2009-06-15 | Discharge: 2009-08-18 | Payer: Self-pay | Admitting: Oncology

## 2009-07-14 ENCOUNTER — Ambulatory Visit: Payer: Self-pay | Admitting: Oncology

## 2009-08-03 ENCOUNTER — Ambulatory Visit (HOSPITAL_COMMUNITY): Admission: RE | Admit: 2009-08-03 | Discharge: 2009-08-03 | Payer: Self-pay | Admitting: Oncology

## 2009-08-06 ENCOUNTER — Ambulatory Visit: Admission: RE | Admit: 2009-08-06 | Discharge: 2009-09-01 | Payer: Self-pay | Admitting: Radiation Oncology

## 2009-08-19 ENCOUNTER — Ambulatory Visit: Payer: Self-pay | Admitting: Oncology

## 2009-09-07 LAB — COMPREHENSIVE METABOLIC PANEL
ALT: 11 U/L (ref 0–35)
AST: 13 U/L (ref 0–37)
Albumin: 3.8 g/dL (ref 3.5–5.2)
Alkaline Phosphatase: 60 U/L (ref 39–117)
BUN: 14 mg/dL (ref 6–23)
CO2: 26 mEq/L (ref 19–32)
Calcium: 9.7 mg/dL (ref 8.4–10.5)
Chloride: 105 mEq/L (ref 96–112)
Creatinine, Ser: 0.84 mg/dL (ref 0.40–1.20)
Glucose, Bld: 109 mg/dL — ABNORMAL HIGH (ref 70–99)
Potassium: 3.7 mEq/L (ref 3.5–5.3)
Sodium: 141 mEq/L (ref 135–145)
Total Bilirubin: 0.5 mg/dL (ref 0.3–1.2)
Total Protein: 7.1 g/dL (ref 6.0–8.3)

## 2009-09-07 LAB — CBC WITH DIFFERENTIAL/PLATELET
BASO%: 0.2 % (ref 0.0–2.0)
Basophils Absolute: 0 10*3/uL (ref 0.0–0.1)
EOS%: 1.9 % (ref 0.0–7.0)
Eosinophils Absolute: 0.1 10*3/uL (ref 0.0–0.5)
HCT: 37 % (ref 34.8–46.6)
HGB: 12.6 g/dL (ref 11.6–15.9)
LYMPH%: 30 % (ref 14.0–49.7)
MCH: 32.6 pg (ref 25.1–34.0)
MCHC: 34.1 g/dL (ref 31.5–36.0)
MCV: 95.9 fL (ref 79.5–101.0)
MONO#: 0.4 10*3/uL (ref 0.1–0.9)
MONO%: 9.2 % (ref 0.0–14.0)
NEUT#: 2.4 10*3/uL (ref 1.5–6.5)
NEUT%: 58.7 % (ref 38.4–76.8)
Platelets: 134 10*3/uL — ABNORMAL LOW (ref 145–400)
RBC: 3.86 10*6/uL (ref 3.70–5.45)
RDW: 14 % (ref 11.2–14.5)
WBC: 4.1 10*3/uL (ref 3.9–10.3)
lymph#: 1.2 10*3/uL (ref 0.9–3.3)
nRBC: 0 % (ref 0–0)

## 2009-09-07 LAB — CA 125: CA 125: 10.7 U/mL (ref 0.0–30.2)

## 2009-09-10 LAB — WOUND CULTURE

## 2009-09-16 ENCOUNTER — Ambulatory Visit: Admission: RE | Admit: 2009-09-16 | Discharge: 2009-09-17 | Payer: Self-pay | Admitting: Radiation Oncology

## 2009-09-20 ENCOUNTER — Ambulatory Visit: Admission: RE | Admit: 2009-09-20 | Discharge: 2009-09-21 | Payer: Self-pay | Admitting: Radiation Oncology

## 2009-09-24 ENCOUNTER — Ambulatory Visit: Payer: Self-pay | Admitting: Oncology

## 2009-09-28 LAB — COMPREHENSIVE METABOLIC PANEL
ALT: 9 U/L (ref 0–35)
AST: 12 U/L (ref 0–37)
Albumin: 3.7 g/dL (ref 3.5–5.2)
Alkaline Phosphatase: 56 U/L (ref 39–117)
BUN: 18 mg/dL (ref 6–23)
CO2: 24 mEq/L (ref 19–32)
Calcium: 8.5 mg/dL (ref 8.4–10.5)
Chloride: 109 mEq/L (ref 96–112)
Creatinine, Ser: 0.83 mg/dL (ref 0.40–1.20)
Glucose, Bld: 104 mg/dL — ABNORMAL HIGH (ref 70–99)
Potassium: 3.3 mEq/L — ABNORMAL LOW (ref 3.5–5.3)
Sodium: 143 mEq/L (ref 135–145)
Total Bilirubin: 0.4 mg/dL (ref 0.3–1.2)
Total Protein: 6.5 g/dL (ref 6.0–8.3)

## 2009-09-28 LAB — CBC WITH DIFFERENTIAL/PLATELET
BASO%: 0.3 % (ref 0.0–2.0)
Basophils Absolute: 0 10*3/uL (ref 0.0–0.1)
EOS%: 1.3 % (ref 0.0–7.0)
Eosinophils Absolute: 0.1 10*3/uL (ref 0.0–0.5)
HCT: 38.2 % (ref 34.8–46.6)
HGB: 13.1 g/dL (ref 11.6–15.9)
LYMPH%: 16.5 % (ref 14.0–49.7)
MCH: 32.6 pg (ref 25.1–34.0)
MCHC: 34.3 g/dL (ref 31.5–36.0)
MCV: 95 fL (ref 79.5–101.0)
MONO#: 0.4 10*3/uL (ref 0.1–0.9)
MONO%: 5.5 % (ref 0.0–14.0)
NEUT#: 5.1 10*3/uL (ref 1.5–6.5)
NEUT%: 76.4 % (ref 38.4–76.8)
Platelets: 113 10*3/uL — ABNORMAL LOW (ref 145–400)
RBC: 4.02 10*6/uL (ref 3.70–5.45)
RDW: 13.6 % (ref 11.2–14.5)
WBC: 6.7 10*3/uL (ref 3.9–10.3)
lymph#: 1.1 10*3/uL (ref 0.9–3.3)

## 2009-09-28 LAB — CA 125: CA 125: 9.2 U/mL (ref 0.0–30.2)

## 2009-10-05 ENCOUNTER — Encounter: Admission: RE | Admit: 2009-10-05 | Discharge: 2009-10-05 | Payer: Self-pay | Admitting: Oncology

## 2009-10-05 ENCOUNTER — Encounter (HOSPITAL_BASED_OUTPATIENT_CLINIC_OR_DEPARTMENT_OTHER): Admission: RE | Admit: 2009-10-05 | Discharge: 2009-10-18 | Payer: Self-pay | Admitting: Internal Medicine

## 2009-11-22 ENCOUNTER — Ambulatory Visit: Payer: Self-pay | Admitting: Oncology

## 2010-01-17 ENCOUNTER — Ambulatory Visit: Payer: Self-pay | Admitting: Oncology

## 2010-01-18 LAB — CBC WITH DIFFERENTIAL/PLATELET
BASO%: 0.5 % (ref 0.0–2.0)
Basophils Absolute: 0 10*3/uL (ref 0.0–0.1)
EOS%: 2.3 % (ref 0.0–7.0)
Eosinophils Absolute: 0.1 10*3/uL (ref 0.0–0.5)
HCT: 36.3 % (ref 34.8–46.6)
HGB: 12.5 g/dL (ref 11.6–15.9)
LYMPH%: 34.2 % (ref 14.0–49.7)
MCH: 32.7 pg (ref 25.1–34.0)
MCHC: 34.4 g/dL (ref 31.5–36.0)
MCV: 95 fL (ref 79.5–101.0)
MONO#: 0.3 10*3/uL (ref 0.1–0.9)
MONO%: 7.6 % (ref 0.0–14.0)
NEUT#: 2.4 10*3/uL (ref 1.5–6.5)
NEUT%: 55.4 % (ref 38.4–76.8)
Platelets: 104 10*3/uL — ABNORMAL LOW (ref 145–400)
RBC: 3.82 10*6/uL (ref 3.70–5.45)
RDW: 13.8 % (ref 11.2–14.5)
WBC: 4.3 10*3/uL (ref 3.9–10.3)
lymph#: 1.5 10*3/uL (ref 0.9–3.3)
nRBC: 0 % (ref 0–0)

## 2010-01-18 LAB — COMPREHENSIVE METABOLIC PANEL
ALT: 10 U/L (ref 0–35)
AST: 13 U/L (ref 0–37)
Albumin: 3.9 g/dL (ref 3.5–5.2)
Alkaline Phosphatase: 55 U/L (ref 39–117)
BUN: 15 mg/dL (ref 6–23)
CO2: 22 mEq/L (ref 19–32)
Calcium: 8.9 mg/dL (ref 8.4–10.5)
Chloride: 107 mEq/L (ref 96–112)
Creatinine, Ser: 0.78 mg/dL (ref 0.40–1.20)
Glucose, Bld: 98 mg/dL (ref 70–99)
Potassium: 3.6 mEq/L (ref 3.5–5.3)
Sodium: 140 mEq/L (ref 135–145)
Total Bilirubin: 0.6 mg/dL (ref 0.3–1.2)
Total Protein: 6.8 g/dL (ref 6.0–8.3)

## 2010-01-18 LAB — CA 125: CA 125: 8.2 U/mL (ref 0.0–30.2)

## 2010-02-25 ENCOUNTER — Ambulatory Visit: Payer: Self-pay | Admitting: Oncology

## 2010-03-01 LAB — CBC WITH DIFFERENTIAL/PLATELET
BASO%: 0.4 % (ref 0.0–2.0)
Basophils Absolute: 0 10*3/uL (ref 0.0–0.1)
EOS%: 1.3 % (ref 0.0–7.0)
Eosinophils Absolute: 0.1 10*3/uL (ref 0.0–0.5)
HCT: 37.5 % (ref 34.8–46.6)
HGB: 12.9 g/dL (ref 11.6–15.9)
LYMPH%: 24.7 % (ref 14.0–49.7)
MCH: 32.7 pg (ref 25.1–34.0)
MCHC: 34.4 g/dL (ref 31.5–36.0)
MCV: 94.9 fL (ref 79.5–101.0)
MONO#: 0.4 10*3/uL (ref 0.1–0.9)
MONO%: 6.6 % (ref 0.0–14.0)
NEUT#: 3.6 10*3/uL (ref 1.5–6.5)
NEUT%: 67 % (ref 38.4–76.8)
Platelets: 124 10*3/uL — ABNORMAL LOW (ref 145–400)
RBC: 3.95 10*6/uL (ref 3.70–5.45)
RDW: 13.3 % (ref 11.2–14.5)
WBC: 5.4 10*3/uL (ref 3.9–10.3)
lymph#: 1.3 10*3/uL (ref 0.9–3.3)
nRBC: 0 % (ref 0–0)

## 2010-03-01 LAB — MORPHOLOGY: PLT EST: DECREASED

## 2010-03-09 ENCOUNTER — Ambulatory Visit: Admission: RE | Admit: 2010-03-09 | Discharge: 2010-03-09 | Payer: Self-pay | Admitting: Gynecology

## 2010-04-14 ENCOUNTER — Encounter: Admission: RE | Admit: 2010-04-14 | Discharge: 2010-04-14 | Payer: Self-pay | Admitting: Internal Medicine

## 2010-04-22 ENCOUNTER — Ambulatory Visit: Payer: Self-pay | Admitting: Oncology

## 2010-04-26 LAB — CBC WITH DIFFERENTIAL/PLATELET
BASO%: 0.2 % (ref 0.0–2.0)
Basophils Absolute: 0 10*3/uL (ref 0.0–0.1)
EOS%: 2.4 % (ref 0.0–7.0)
Eosinophils Absolute: 0.1 10*3/uL (ref 0.0–0.5)
HCT: 37.8 % (ref 34.8–46.6)
HGB: 12.9 g/dL (ref 11.6–15.9)
LYMPH%: 24.8 % (ref 14.0–49.7)
MCH: 32.5 pg (ref 25.1–34.0)
MCHC: 34.1 g/dL (ref 31.5–36.0)
MCV: 95.2 fL (ref 79.5–101.0)
MONO#: 0.3 10*3/uL (ref 0.1–0.9)
MONO%: 6.3 % (ref 0.0–14.0)
NEUT#: 3.4 10*3/uL (ref 1.5–6.5)
NEUT%: 66.3 % (ref 38.4–76.8)
Platelets: 111 10*3/uL — ABNORMAL LOW (ref 145–400)
RBC: 3.97 10*6/uL (ref 3.70–5.45)
RDW: 13.1 % (ref 11.2–14.5)
WBC: 5.1 10*3/uL (ref 3.9–10.3)
lymph#: 1.3 10*3/uL (ref 0.9–3.3)
nRBC: 0 % (ref 0–0)

## 2010-04-26 LAB — COMPREHENSIVE METABOLIC PANEL
ALT: 12 U/L (ref 0–35)
AST: 17 U/L (ref 0–37)
Albumin: 4.1 g/dL (ref 3.5–5.2)
Alkaline Phosphatase: 56 U/L (ref 39–117)
BUN: 12 mg/dL (ref 6–23)
CO2: 24 mEq/L (ref 19–32)
Calcium: 9.1 mg/dL (ref 8.4–10.5)
Chloride: 108 mEq/L (ref 96–112)
Creatinine, Ser: 0.84 mg/dL (ref 0.40–1.20)
Glucose, Bld: 103 mg/dL — ABNORMAL HIGH (ref 70–99)
Potassium: 3.7 mEq/L (ref 3.5–5.3)
Sodium: 141 mEq/L (ref 135–145)
Total Bilirubin: 0.6 mg/dL (ref 0.3–1.2)
Total Protein: 7.1 g/dL (ref 6.0–8.3)

## 2010-06-17 ENCOUNTER — Ambulatory Visit: Payer: Self-pay | Admitting: Oncology

## 2010-06-21 LAB — CBC WITH DIFFERENTIAL/PLATELET
BASO%: 0.4 % (ref 0.0–2.0)
Basophils Absolute: 0 10*3/uL (ref 0.0–0.1)
EOS%: 3.8 % (ref 0.0–7.0)
Eosinophils Absolute: 0.2 10*3/uL (ref 0.0–0.5)
HCT: 35.1 % (ref 34.8–46.6)
HGB: 11.9 g/dL (ref 11.6–15.9)
LYMPH%: 33.6 % (ref 14.0–49.7)
MCH: 32.6 pg (ref 25.1–34.0)
MCHC: 33.9 g/dL (ref 31.5–36.0)
MCV: 96.2 fL (ref 79.5–101.0)
MONO#: 0.3 10*3/uL (ref 0.1–0.9)
MONO%: 5.8 % (ref 0.0–14.0)
NEUT#: 2.7 10*3/uL (ref 1.5–6.5)
NEUT%: 56.4 % (ref 38.4–76.8)
Platelets: 112 10*3/uL — ABNORMAL LOW (ref 145–400)
RBC: 3.65 10*6/uL — ABNORMAL LOW (ref 3.70–5.45)
RDW: 13.9 % (ref 11.2–14.5)
WBC: 4.8 10*3/uL (ref 3.9–10.3)
lymph#: 1.6 10*3/uL (ref 0.9–3.3)
nRBC: 0 % (ref 0–0)

## 2010-06-21 LAB — COMPREHENSIVE METABOLIC PANEL
ALT: 8 U/L (ref 0–35)
AST: 13 U/L (ref 0–37)
Albumin: 3.8 g/dL (ref 3.5–5.2)
Alkaline Phosphatase: 52 U/L (ref 39–117)
BUN: 18 mg/dL (ref 6–23)
CO2: 26 mEq/L (ref 19–32)
Calcium: 8.4 mg/dL (ref 8.4–10.5)
Chloride: 110 mEq/L (ref 96–112)
Creatinine, Ser: 0.79 mg/dL (ref 0.40–1.20)
Glucose, Bld: 99 mg/dL (ref 70–99)
Potassium: 3.8 mEq/L (ref 3.5–5.3)
Sodium: 143 mEq/L (ref 135–145)
Total Bilirubin: 0.4 mg/dL (ref 0.3–1.2)
Total Protein: 6.6 g/dL (ref 6.0–8.3)

## 2010-06-21 LAB — CA 125: CA 125: 6.9 U/mL (ref 0.0–30.2)

## 2010-07-11 ENCOUNTER — Encounter: Admission: RE | Admit: 2010-07-11 | Discharge: 2010-07-11 | Payer: Self-pay | Admitting: Rheumatology

## 2010-08-12 ENCOUNTER — Ambulatory Visit: Payer: Self-pay | Admitting: Oncology

## 2010-08-16 LAB — COMPREHENSIVE METABOLIC PANEL
ALT: 8 U/L (ref 0–35)
AST: 12 U/L (ref 0–37)
Albumin: 3.7 g/dL (ref 3.5–5.2)
Alkaline Phosphatase: 50 U/L (ref 39–117)
BUN: 15 mg/dL (ref 6–23)
CO2: 27 mEq/L (ref 19–32)
Calcium: 8.8 mg/dL (ref 8.4–10.5)
Chloride: 110 mEq/L (ref 96–112)
Creatinine, Ser: 0.83 mg/dL (ref 0.40–1.20)
Glucose, Bld: 111 mg/dL — ABNORMAL HIGH (ref 70–99)
Potassium: 3.9 mEq/L (ref 3.5–5.3)
Sodium: 145 mEq/L (ref 135–145)
Total Bilirubin: 0.5 mg/dL (ref 0.3–1.2)
Total Protein: 6.7 g/dL (ref 6.0–8.3)

## 2010-08-16 LAB — CBC WITH DIFFERENTIAL/PLATELET
BASO%: 0.5 % (ref 0.0–2.0)
Basophils Absolute: 0 10*3/uL (ref 0.0–0.1)
EOS%: 2.3 % (ref 0.0–7.0)
Eosinophils Absolute: 0.1 10*3/uL (ref 0.0–0.5)
HCT: 36.2 % (ref 34.8–46.6)
HGB: 12.3 g/dL (ref 11.6–15.9)
LYMPH%: 32.4 % (ref 14.0–49.7)
MCH: 32.6 pg (ref 25.1–34.0)
MCHC: 34 g/dL (ref 31.5–36.0)
MCV: 96 fL (ref 79.5–101.0)
MONO#: 0.3 10*3/uL (ref 0.1–0.9)
MONO%: 6.3 % (ref 0.0–14.0)
NEUT#: 2.6 10*3/uL (ref 1.5–6.5)
NEUT%: 58.5 % (ref 38.4–76.8)
Platelets: 112 10*3/uL — ABNORMAL LOW (ref 145–400)
RBC: 3.77 10*6/uL (ref 3.70–5.45)
RDW: 13.1 % (ref 11.2–14.5)
WBC: 4.4 10*3/uL (ref 3.9–10.3)
lymph#: 1.4 10*3/uL (ref 0.9–3.3)
nRBC: 0 % (ref 0–0)

## 2010-10-07 ENCOUNTER — Ambulatory Visit (HOSPITAL_BASED_OUTPATIENT_CLINIC_OR_DEPARTMENT_OTHER): Payer: Medicare Other | Admitting: Oncology

## 2010-10-11 LAB — CBC WITH DIFFERENTIAL/PLATELET
BASO%: 0.4 % (ref 0.0–2.0)
Basophils Absolute: 0 10*3/uL (ref 0.0–0.1)
EOS%: 2.1 % (ref 0.0–7.0)
Eosinophils Absolute: 0.1 10*3/uL (ref 0.0–0.5)
HCT: 35.8 % (ref 34.8–46.6)
HGB: 12.2 g/dL (ref 11.6–15.9)
LYMPH%: 25.7 % (ref 14.0–49.7)
MCH: 32.6 pg (ref 25.1–34.0)
MCHC: 34 g/dL (ref 31.5–36.0)
MCV: 95.9 fL (ref 79.5–101.0)
MONO#: 0.3 10*3/uL (ref 0.1–0.9)
MONO%: 7.4 % (ref 0.0–14.0)
NEUT#: 2.7 10*3/uL (ref 1.5–6.5)
NEUT%: 64.4 % (ref 38.4–76.8)
Platelets: 101 10*3/uL — ABNORMAL LOW (ref 145–400)
RBC: 3.74 10*6/uL (ref 3.70–5.45)
RDW: 14.6 % — ABNORMAL HIGH (ref 11.2–14.5)
WBC: 4.3 10*3/uL (ref 3.9–10.3)
lymph#: 1.1 10*3/uL (ref 0.9–3.3)

## 2010-12-13 ENCOUNTER — Encounter (HOSPITAL_BASED_OUTPATIENT_CLINIC_OR_DEPARTMENT_OTHER): Payer: Medicare Other | Admitting: Oncology

## 2010-12-13 DIAGNOSIS — J189 Pneumonia, unspecified organism: Secondary | ICD-10-CM

## 2010-12-13 DIAGNOSIS — Z8543 Personal history of malignant neoplasm of ovary: Secondary | ICD-10-CM

## 2010-12-13 DIAGNOSIS — D696 Thrombocytopenia, unspecified: Secondary | ICD-10-CM

## 2010-12-13 DIAGNOSIS — C50419 Malignant neoplasm of upper-outer quadrant of unspecified female breast: Secondary | ICD-10-CM

## 2010-12-13 DIAGNOSIS — M19049 Primary osteoarthritis, unspecified hand: Secondary | ICD-10-CM

## 2010-12-13 LAB — CBC WITH DIFFERENTIAL/PLATELET
BASO%: 0.2 % (ref 0.0–2.0)
Basophils Absolute: 0 10*3/uL (ref 0.0–0.1)
EOS%: 2 % (ref 0.0–7.0)
Eosinophils Absolute: 0.1 10*3/uL (ref 0.0–0.5)
HCT: 36.2 % (ref 34.8–46.6)
HGB: 12.5 g/dL (ref 11.6–15.9)
LYMPH%: 32.1 % (ref 14.0–49.7)
MCH: 31.8 pg (ref 25.1–34.0)
MCHC: 34.5 g/dL (ref 31.5–36.0)
MCV: 92.1 fL (ref 79.5–101.0)
MONO#: 0.3 10*3/uL (ref 0.1–0.9)
MONO%: 5.6 % (ref 0.0–14.0)
NEUT#: 2.8 10*3/uL (ref 1.5–6.5)
NEUT%: 60.1 % (ref 38.4–76.8)
Platelets: 110 10*3/uL — ABNORMAL LOW (ref 145–400)
RBC: 3.93 10*6/uL (ref 3.70–5.45)
RDW: 13.8 % (ref 11.2–14.5)
WBC: 4.6 10*3/uL (ref 3.9–10.3)
lymph#: 1.5 10*3/uL (ref 0.9–3.3)
nRBC: 0 % (ref 0–0)

## 2010-12-13 LAB — MORPHOLOGY: PLT EST: DECREASED

## 2010-12-29 LAB — DIFFERENTIAL
Basophils Absolute: 0 10*3/uL (ref 0.0–0.1)
Basophils Relative: 1 % (ref 0–1)
Eosinophils Absolute: 0.1 10*3/uL (ref 0.0–0.7)
Eosinophils Relative: 3 % (ref 0–5)
Lymphocytes Relative: 38 % (ref 12–46)
Lymphs Abs: 1.9 10*3/uL (ref 0.7–4.0)
Monocytes Absolute: 0.3 10*3/uL (ref 0.1–1.0)
Monocytes Relative: 6 % (ref 3–12)
Neutro Abs: 2.7 10*3/uL (ref 1.7–7.7)
Neutrophils Relative %: 53 % (ref 43–77)

## 2010-12-29 LAB — CBC
HCT: 36 % (ref 36.0–46.0)
Hemoglobin: 12.7 g/dL (ref 12.0–15.0)
MCHC: 35.3 g/dL (ref 30.0–36.0)
MCV: 94.5 fL (ref 78.0–100.0)
Platelets: 112 10*3/uL — ABNORMAL LOW (ref 150–400)
RBC: 3.81 MIL/uL — ABNORMAL LOW (ref 3.87–5.11)
RDW: 13.8 % (ref 11.5–15.5)
WBC: 5.1 10*3/uL (ref 4.0–10.5)

## 2010-12-29 LAB — BASIC METABOLIC PANEL
BUN: 10 mg/dL (ref 6–23)
CO2: 25 mEq/L (ref 19–32)
Calcium: 8.7 mg/dL (ref 8.4–10.5)
Chloride: 109 mEq/L (ref 96–112)
Creatinine, Ser: 0.64 mg/dL (ref 0.4–1.2)
GFR calc Af Amer: >60 mL/min (ref >60)
GFR calc non Af Amer: >60 mL/min (ref >60)
Glucose, Bld: 99 mg/dL (ref 70–99)
Potassium: 3.4 mEq/L — ABNORMAL LOW (ref 3.5–5.1)
Sodium: 140 mEq/L (ref 135–145)

## 2010-12-29 LAB — POCT HEMOGLOBIN-HEMACUE: Hemoglobin: 13.6 g/dL (ref 12.0–15.0)

## 2010-12-29 LAB — APTT: aPTT: 32 seconds (ref 24–37)

## 2010-12-29 LAB — PROTIME-INR
INR: 0.9 (ref 0.00–1.49)
Prothrombin Time: 12.5 seconds (ref 11.6–15.2)

## 2011-01-31 NOTE — Consult Note (Signed)
NAME:  Monique Watson, VENARD NO.:  [id number]   MEDICAL RECORD NO.:  [id number]          PATIENT TYPE:  OUT   LOCATION:  GYN                          FACILITY:  [facility]   PHYSICIAN:  De Blanch, M.D.DATE OF BIRTH:  April 18, 1941   DATE OF CONSULTATION:  DATE OF DISCHARGE:                                 CONSULTATION   CHIEF COMPLAINT:  Ovarian cancer.   INTERVAL HISTORY:  Since her last visit, the patient has done well.  She  denies any GI or GU symptoms.  Has no pelvic pain, pressure, vaginal  bleeding or discharge.  She has been followed with normal CA125 as the  last being in December which was 10 units per mL.  Follow up CA125 is  scheduled for later today.  Since her last visit, she has had no  complaint.  Functional status is excellent.  She is working part-time.   HISTORY OF PRESENT ILLNESS:  Stage III C ovarian cancer, undergoing  initial debulking surgery in June 2006.  She had a complete surgical  resection with no gross residual disease and was then treated with 6  cycles of carboplatin and Taxol chemotherapy.  Preoperatively, her CA125  value was 2,665 units per mL.  On completion of chemotherapy, it was 8  units per mL and has remained in this low range since that time.   PAST MEDICAL HISTORY/MEDICAL ILLNESSES:  Hypertension.   PAST SURGICAL HISTORY:  1. Appendectomy.  2. Tubal ligation.  3. Bladder suspension.  4. Laparoscopic cholecystectomy.  5. Ovarian cancer debulking including TAH/BSO omentectomy 2006.   DRUG ALLERGIES:  NONE.   CURRENT MEDICATIONS:  1. Premarin 0.9 mg daily.  2. Diovan.   OBSTETRICAL HISTORY:  Gravida 2.   FAMILY HISTORY:  Mother with ovarian cancer.  The patient has a number  of paternal aunts with breast cancer.  Colon cancer is noted in the  patient's brother and maternal aunts.   SOCIAL HISTORY:  The patient is widowed.  She smokes 1 pack per day.  Does not drink.   REVIEW OF SYSTEMS:  A 10-point  comprehensive review of systems negative  except as noted above.   PHYSICAL EXAMINATION:  VITAL SIGNS:  Weight 181 pounds, blood pressure  130/70, pulse 80, respiratory rate 20.  GENERAL:  The patient is a healthy African-American female in no acute  distress.  HEENT:  Negative.  NECK:  Supple without thyromegaly.  There is no supraclavicular or  inguinal adenopathy.  ABDOMEN:  Soft, nontender.  No mass, organomegaly, ascites, hernia is  noted.  PELVIC:  EGBUS, vagina and urethrea are normal.  Cervix and uterus  surgically absent.  Adnexa without masses.  RECTAL/VAGINAL:  Confirmed.  LOWER EXTREMITIES:  Without edema or varicosities.   IMPRESSION:  Stage III C completely resected ovarian cancer 2006,  clinically free of disease.   PLAN:  1. CA125 will be obtained today.  2. The patient will return to see Dr. Darrold Span in 6 months.  3. Return to see [place] in 1 year.      De Blanch, M.D.  Electronically Signed  DC/MEDQ  D:  03/06/2008  T:  03/06/2008  Job:  [id number]   cc:   Lennis P. Darrold Span, M.D.  Fax: [phone]   Merlene Laughter. Renae Gloss, M.D.  Fax: [phone]   Telford Nab, R.N.  [address]  [place], [place] [address]

## 2011-01-31 NOTE — Op Note (Signed)
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   PROCEDURE:  Excision of left breast inferior and anterior margin,  sentinel lymph node.   SURGEON:  Lennie Muckle, MD   General endotracheal anesthesia.   FINDINGS:  Sentinel lymph node #1 with blue and measured 92, sentinel #2  is measured 25.  Touch prep of sentinel node was negative.   SPECIMEN:  Left breast tissue and sentinel lymph node x2.  Reexcision of  inferior margin and anterior margin.   No immediate complications.   No drains were placed.   INDICATION FOR PROCEDURE:  Monique Watson is a 70 year old female who had  findings of DCIS on biopsy and performed a needle excisional biopsy on  March 10, the margin was positive for a DCIS component in the inferior  margin.  The anterior margin was closed at 0.2 cm.  There was also  invasive ductal carcinoma.  I discussed with the patient, she needed  reexcision of the inferior margin as well as the sentinel lymph node due  to the invasive component.  Informed consent was obtained prior to the  procedure.   DETAILS OF PROCEDURE:  Monique Watson was identified in the preoperative  holding area.  I marked her left breast.  She was taken to operating  suite and placed in supine position.  After administration of general  endotracheal anesthesia, the left breast and axilla were prepped and  draped in usual sterile fashion.  A time-out procedure indicating the  patient and procedure were performed.  She received 2 g of Kefzol.  I  had injected blue dye just beneath the areola approximately 2 mL per  each  quadrant.  I then gently massaged the breast for approximately 4  minutes.  Using the Neoprobe, I located an area in the left axilla.  I  placed the incision just beneath the hairline.  I bluntly dissected with  electrocautery.  I continued the blunt dissection with a hemostat.  I  was able to find the lymph node and actually, I performed excision with  approximately 3 nodes.  The first node measured at 92 and was blue.  The  other node only measured approximately 25.  This was sent to pathology.  Touch prep revealed no evidence of malignant cells in sentinel #1.  The  base measured less than 4 on  count with a Neoprobe.  The area was  irrigated.  There was no bleeding and closed in layered fashion using a  3-0 Vicryl and then 4-0 Monocryl was used for skin.   I then turned my attention to the left breast.  I performed an excision  of the previous skin area due to some skin breakdown.  Then, a #15 blade  was utilized to excise the skin.  I then was able to find the previous  dissection pocket and I encountered the seroma.  Dark fluid was  encountered.  This was suctioned.  I then grasped the inferior margin of  the previous biopsy cavity and using electrocautery excised this intact.  I went deep on to the pectoralis muscle.  This was ornated with a long  lateral, short superior, and double short deep.  I then chose to  reexcise the anterior margin.  Using the mammogram as a guide, I  reexcised the area of 2'o clock superiorly.  This was also ornated with  a long lateral, short, superior, and double short deep.  The wound bed  was irrigated.  There was no evidence of bleeding.  I reapproximated  some of the deeper tissues with steri stips for final dressing.  A 30 mL  Marcaine was injected into the breast line and in the axilla.  The  patient was extubated and transported to postanesthesia care unit in  stable condition.  She will follow up with me in approximately 2-3  weeks.  I call her  with the final pathology.      Lennie Muckle, MD  Electronically Signed     ALA/MEDQ  D:  12/10/2008  T:  12/11/2008  Job:  [id number]   cc:   Jeralyn Ruths, MD  Linna Darner

## 2011-01-31 NOTE — Op Note (Signed)
NAMESITARA, CASHWELL                ACCOUNT NO.:  [id number]   MEDICAL RECORD NO.:  [id number]          PATIENT TYPE:  AMB   LOCATION:  SDS                          FACILITY:  [facility]   PHYSICIAN:  Lennie Muckle, MD      DATE OF BIRTH:  10/07/1940   DATE OF PROCEDURE:  11/25/2008  DATE OF DISCHARGE:  11/25/2008                               OPERATIVE REPORT   PREOPERATIVE DIAGNOSIS:  Ductal carcinoma in situ.   POSTOPERATIVE DIAGNOSIS:  Ductal carcinoma in situ.   PROCEDURE:  Needle-localized lumpectomy.   SURGEON:  Lennie Muckle, MD   No assistants.   General endotracheal anesthesia.   FINDINGS:  Wire within the specimen.  She did have a re-excision of the  superior portion of the breast of the resection margin.  Ms. Morrie Sheldon is a 70-  year-old female I had seen due to findings concerning on a recent  mammogram.  Biopsy did reveal high-grade DCIS.  There was a question of  an invasive component, but the pathology was not consistent with  invasive component.  I talked her about performing a needle-localized  lumpectomy.  Informed consent had been obtained prior to procedure.   DETAILS OF THE PROCEDURE:  Ms. Morrie Sheldon was identified in the preoperative  holding area.  She received IV antibiotics and the breast was marked by  me.  She was taken the operating room.  She had received wire  localization by Dr. Yolanda Bonine preoperatively.  This was trimmed to size  and her left breast was prepped and draped in usual sterile fashion.  Time-out procedure indicating patient and procedure was performed.  Using the mammogram and the needle as a guide, I placed an incision on  the breast in the vicinity of the wire.  I dissected and created flaps  and brought the wire into the operative view.  I then followed the wire  and performed excision of the area.  The specimen was oriented with  suture short superior, long lateral, and double short with deep.  Imaging did reveal the wire within the specimen but  closed margin  superiorly.  Therefore, I excised superior margin.  This was sent for  final pathology.  I then irrigated the tissues.  Electrocautery was used  to control small amount of oozing.  I then reapproximated the breast  after placing clips within the biopsy site.  The tissues were  reapproximated using 3-0  Vicryl, skin was closed with 4-0 Monocryl, and Dermabond was placed as  final dressing.  The patient was extubated, transferred to the  Postanesthesia Care Unit in stable condition.  I will call her with the  final pathology results.      Lennie Muckle, MD  Electronically Signed     ALA/MEDQ  D:  12/22/2008  T:  12/23/2008  Job:  [id number]   cc:   Jeralyn Ruths, MD

## 2011-01-31 NOTE — Consult Note (Signed)
NAME:  Monique Watson, Monique Watson NO.:  [id number]   MEDICAL RECORD NO.:  [id number]          PATIENT TYPE:  OUT   LOCATION:  GYN                          FACILITY:  [facility]   PHYSICIAN:  De Blanch, M.D.DATE OF BIRTH:  May 28, 1941   DATE OF CONSULTATION:  02/08/2007  DATE OF DISCHARGE:                                 CONSULTATION   CHIEF COMPLAINT:  Ovarian cancer.   HISTORY:  Since her last visit, the patient has done well.  She denies  any GI or GU symptoms.  She has no pelvic pain, pressure, vaginal bleed  or discharge.  Her CA-125 value on January 08, 2007 was 8 units per mL.  This is stable in comparison with CA-125 values dating back to August  2006.   Overall, the patient's functional status is good.  She is somewhat  depressed because of the loss of her husband unexpectedly in January  2008.   HISTORY OF PRESENT ILLNESS:  Stage IIIC ovarian cancer.  The patient  underwent initial debulking in June 2006.  She had complete surgical  resection with no gross residual disease.  She was then treated with six  cycles of carboplatin and Taxol chemotherapy.  Preoperatively, her CA-  125 value was 2,665 and at the completion of chemotherapy, it was 8  units per mL.   PAST MEDICAL HISTORY/MEDICAL ILLNESSES:  Hypertension.   PAST SURGICAL HISTORY:  1. Appendectomy.  2. Tubal ligation.  3. Bladder suspension.  4. Laparoscopic cholecystectomy.  5. Ovarian cancer debulking in 2006.   DRUG ALLERGIES:  None.   CURRENT MEDICATIONS:  1. Premarin 0.9 mg daily.  2. Diovan.   OBSTETRICAL HISTORY:  Gravida 2.   FAMILY HISTORY:  Mother with ovarian cancer.  Ten paternal aunts with  breast cancer.  Colon cancer diagnosed in the patient's brother and  maternal aunt.   SOCIAL HISTORY:  The patient is married but retired.  She is now a  widow.  She smokes one pack per day.   REVIEW OF SYSTEMS:  A 10-point comprehensive review of systems was  negative except as noted  above.   PHYSICAL EXAMINATION:  VITAL SIGNS:  Weight 182 pounds.  Blood pressure  130/70.  Pulse 80.  Respiratory rate 20.  GENERAL APPEARANCE:  The patient is a healthy African-American female in  no acute distress.  HEENT:  Negative.  NECK:  Supple without thyromegaly.  There is no supraclavicular or  inguinal adenopathy.  ABDOMEN:  Soft and nontender.  No mass, organomegaly, ascites or hernias  are noted.  PELVIC EXAM:  EG/BUS, vagina and urethra are normal.  Cervix and uterus  are surgically absent.  Adnexa without masses.  Rectovaginal exam  confirms.  LOWER EXTREMITIES:  Without edema or varicosities.   IMPRESSION:  Stage IIIC ovarian cancer, clinically free of disease.   PLAN:  The patient is now at two years of follow-up.  We will lengthen  her visits to six-month intervals.  She will return to see Dr. Melvyn Neth in  six months and return to see me in one year.  De Blanch, M.D.  Electronically Signed     DC/MEDQ  D:  02/08/2007  T:  02/08/2007  Job:  [id number]   cc:   Telford Nab, R.N.  [address]  [place], [place] [address]   Lennis P. Darrold Span, M.D.  Fax: [phone]   Merlene Laughter. Renae Gloss, M.D.  Fax: [phone]

## 2011-01-31 NOTE — Consult Note (Signed)
Monique Watson, TOM NO.:  [id number]   MEDICAL RECORD NO.:  [id number]          PATIENT TYPE:  OUT   LOCATION:  GYN                          [facility]ACILITY:  [facility]   PHYSICIAN:  De Blanch, M.D.DATE OF BIRTH:  03-20-1941   DATE OF CONSULTATION:  03/03/2009  DATE OF DISCHARGE:                                 CONSULTATION   CHIEF COMPLAINT:  Ovarian cancer.   INTERVAL HISTORY:  The patient returns for continuing followup of an  ovarian cancer initially diagnosed in 02/2005.  Since her last visit,  she has done well from a gynecologic point of view.  She denies any GI  or GU symptoms.  There is no pelvic pain, pressure, vaginal bleeding, or  discharge.  Her functional status is excellent.   On the other hand, she has recently been diagnosed with a microinvasive  breast cancer; undergoing lumpectomy and radiation therapy.  The patient  is currently receiving Arimidex.   Her CA-125 on 06/03 was 8.1 units/mL, which is stable from prior visits.  Overall she is doing quite well.   HISTORY OF PRESENT ILLNESS:  Stage IIIC ovarian cancer.  The patient  underwent initial debulking in 02/2005.  She had a complete surgical  resection and then was treated with 6 cycles of carboplatin and Taxol  chemotherapy.  Preoperatively her CA-125 was 2665, and at completion of  chemotherapy it was 8 units/mL.  She remains in remission as of this  visit.   PAST MEDICAL HISTORY/MEDICAL ILLNESSES:  1. Hypertension.  2. Microinvasive breast cancer.   PAST SURGICAL HISTORY:  1. Appendectomy.  2. Tubal ligation.  3. Bladder suspension.  4. Laparoscopic cholecystectomy.  5. Ovarian cancer debulking including TAH/BSO and omentectomy in 2006.  6. A 2010 lumpectomy.   DRUG ALLERGIES:  None.   CURRENT MEDICATIONS:  1. Arimidex.  2. Diovan.   OBSTETRICAL HISTORY:  Gravida 2.   FAMILY HISTORY:  Mother with ovarian cancer and aunts with breast  cancer.  Colon cancer is known  in the patient's brother and maternal  aunts.   SOCIAL HISTORY:  The patient is widowed.  She smokes 1 pack per day.  Does not drink.   REVIEW OF SYSTEMS:  A 10-point comprehensive review of systems is  negative except as noted above.   PHYSICAL EXAMINATION:  VITAL SIGNS:  Weight 184 pounds, blood pressure  138.70.  GENERAL:  The patient is a healthy African American female in no acute  distress.  HEENT:  Negative.  NECK:  Supple without thyromegaly.  There is no supraclavicular or  inguinal adenopathy.  ABDOMEN:  Soft, nontender.  No mass, organomegaly, or hernias noted.  PELVIC:  EG, BUS, vagina, bladder, and urethra are normal.  Cervix and  uterus surgically absent.  Adnexa without masses.  Rectovaginal exam  confirms.  EXTREMITIES:  Lower extremities without edema or varicosities.   IMPRESSION:  Stage IIIC ovarian cancer, 02/2005.  Clinically free of  disease.   PLAN:  The patient will see Dr. Darrold Span in 6 months and have a CA-125  obtained at that time.  She will return  to see me in 1 year.      De Blanch, M.D.  Electronically Signed     DC/MEDQ  D:  03/03/2009  T:  03/03/2009  Job:  [id number]   cc:   Merlene Laughter. Renae Gloss, M.D.  Fax: [phone]   Telford Nab, R.N.  [address]  [place], [place] [address]   Lennis P. Darrold Span, M.D.  Fax: [phone]

## 2011-02-03 NOTE — Discharge Summary (Signed)
Monique Watson, Monique Watson                ACCOUNT NO.:  [id number]   MEDICAL RECORD NO.:  [id number]          PATIENT TYPE:  INP   LOCATION:  6740                         FACILITY:  [facility]   PHYSICIAN:  Isidor Holts, M.D.  DATE OF BIRTH:  [birth year]   DATE OF ADMISSION:  03/13/2005  DATE OF DISCHARGE:  03/14/2005                                 DISCHARGE SUMMARY   PRIMARY MEDICAL DOCTOR:  Merlene Laughter. Renae Gloss, M.D.   GYNECOLOGIST/ONCOLOGIST:  De Blanch, M.D.   DISCHARGE DIAGNOSES:  1.  Ovarian neoplasm with elevated CA-125 tumor marker and ascites/possible      peritoneal involvement.  2.  History of hypertension.  3.  History of dyslipidemia.  4.  Anemia.  5.  Smoking history.  6.  Chronic obstructive pulmonary disease/bronchiectasis.   DISCHARGE MEDICATIONS:  1.  Diovan/HCT (160/25) one pill p.o. every day.  2.  Percocet (5/325) one pill p.o. p.r.n. q.6h. for pain, total of eight      pills dispensed.  3.  Estratab to discontinue Premarin.   PROCEDURES:  1.  Abdominal CT scan, dated March 09, 2005, at [facility].  This was      reported as showing large cystic/solid abdominal pelvic mass, favor to      reflect ovarian neoplasm, associated ascites suggested metastatic      peritoneal involvement, also small ventral uterine fundal leiomyoma.  2.  Head CT scan, dated March 14, 2005.  This showed no evidence of brain or      skull metastasis.  No acute intracranial abnormalities.  Minimal      insignificant bilateral ethmoid sinus disease.  3.  Chest CT scan, dated March 14, 2005.  This showed bilateral axillary      lymphadenopathy - nonspecific, mildly enlarged sub centimeter right      paracardiac lymph nodes worrisome for metastatic disease, moderate      ascites in the upper abdomen, omental soft tissue in upper abdomen which      is consistent with metastatic disease from ovarian cancer, emphysema,      bronchiectasis in both lungs - worse in right middle lobe,  nodular      interstitial disease particularly in the right upper lobe, no definite      pulmonary current hilar metastasis.  There is also nonspecific bilateral      pleural thickening.  No pleural effusions.   CONSULTATIONS:  De Blanch, M.D. gynecologist/oncologist.   ADMISSION HISTORY:  This is a 70 year old female, with known history of  hypertension, dyslipidemia, and anemia, who presented via direct admission  from her PMD's office, with complaints of increasing fatigue, lower  abdominal pain, weight loss of approximately 15-20 pounds over the past 1-2  months.  She was subsequently evaluated by her PMD and abdominal/pelvic CT  scan was arranged which was carried out, on March 09, 2005, and showed a  large pelvic tumor thought to be ovarian neoplasm.  She was referred for  workup and management and was subsequently admitted for further  evaluation/investigation and management.   CLINICAL COURSE:  1.  Ovarian neoplasm.  The  patient was admitted directly, with confirmed,      large heterogenous cystic/solid ovarian mass with significant ascites as      evidenced by CT scan findings of March 09, 2005, done at [facility].      These findings were buttressed by findings of clinical examination,      which revealed a large lower abdominal mass which was firm, nontender,      and approximately 18/52 gestational size, arising out of the pelvis.      There was no palpable hepatoegaly-splenomegaly.  The patient underwent      head CT and pelvic CT scan, on March 14, 2005, essentially to rule out      metastatic disease.  The findings are elucidated in detail in the      procedure list above.  GYN oncology consultation was deemed indicated,      and this was requested from Dr. De Blanch who evaluated the      patient in his office at [facility] on March 14, 2005.  Conclusion is the patient does indeed have ovarian cancer,      especially, in  view of the fact that her CA-125 tumor marker is elevated      at 2,665.0.  Recommendation, is that she will require exploratory      laparotomy and tumor debulking.  This,  because of practical constraints      will be performed at [facility].  This has been discussed with the      patient and she is agreeable.  As a matter of fact, she has already been      scheduled for pre-op workup at 10 a.m. on March 15, 2005.   1.  Hypertension.  This was adequately controlled with antihypertensive      medication, i.e., combined ARB and HCTZ during the course of her      hospital stay.   1.  Smoking history.  The patient is a smoker and chest CT scan findings      have demonstrated chronic changes secondary to cigarette smoking.  She      has been counseled about this, and while in the hospital she was      commenced on a Nicoderm patch.  She was, however, not short of breath      during the course of her hospital stay and therefore, did not require      treatment with bronchodilator nebulizers.   1.  History of anemia.  CBC at the time of initial evaluation revealed the      following findings:  WBC 8.5, hemoglobin 10.6, hematocrit 31.7,      platelets 241.   DISPOSITION:  The patient was discharged in satisfactory condition on March 14, 2005.   DIET:  No added salt.   ACTIVITY:  As tolerated.   WOUND CARE:  Not applicable.   PAIN MANAGEMENT:  See discharge medications above.   FOLLOWUP INSTRUCTIONS:  This will be per GYN oncology/surgery.  However, the  patient is to follow up routinely, with her PMD Dr. Andi Devon  following surgery.  She is to call for an appointment.  All of this has been  communicated to the patient and she has verbalized understanding.       CO/MEDQ  D:  03/14/2005  T:  03/14/2005  Job:  [id number]   cc:   Merlene Laughter. Renae Gloss, M.D.  [address]  [address]  [place]  [place] [address] Fax: [phone]   De Blanch, M.D.

## 2011-02-03 NOTE — Procedures (Signed)
[place]. [facility]  Patient:    Monique Watson, Monique Watson                       MRN: [id number] Proc. Date: 05/15/00 Adm. Date:  [id number] Attending:  Nelda Marseille CC:         Merlene Laughter. Renae Gloss, M.D.  Eulah Pont, M.D.   Procedure Report  OPERATION PERFORMED:  Esphagogastroduodenoscopy with stent removal.  INDICATIONS:  Patient with a bile leak status post stent placement doing fairly well with only some minimal upper tract symptoms; wants to repeat endoscopy and remove stent  Consent was signed after risks, benefits, methods and options were thoroughly discussed, both in the office and prior to any premeds given.  MEDICINES USED:  Demerol 60, Versed 6.  DESCRIPTION OF PROCEDURE:  The video endoscope was inserted by direct vision. The esophagus was normal.  The scope was inserted into the stomach and, unfortunately, some old food was seen which obscured almost entirely the greater curve.  The scope was inserted through a normal antrum and a normal pylorus and the old scar from previous ulcer disease was seen in the duodenal bulb and no active ulceration.  The scope, at this juncture, was withdrawn back to the stomach and quick retroflexion confirmed a hiatal hernia in the cardia.  The fundus was normal.  The rest of the stomach was obscured by the food.  It was too thick to wash in sections, so we elected to advance into the duodenum around the C-loop where the stent was seen, snared and the stent was withdrawn in the customary fashion, destroying it to the head of the scope and using both the snare and suction to maintain position.  The scope was withdrawn with the stent in tandem, then recovered.  The patient tolerated the procedure well and there was no obvious immediate complication.  ENDOSCOPIC SURGERY: 1. Small hiatal hernia. 2. Old food in the stomach, stomach not well evaluated. 3. Bulb with a questionable scar from previous ulcer disease,  probably, but    no active ulceration. 4. Status post stent removal using the snare.  PLAN: 1. Continue Prevacid p.r.n., since that seems to be helping. 2. GI follow up in two months to recheck symptoms.  We will give her some    hiatal hernia information and slowly advance diet. DD:  05/15/00 TD:  05/16/00 Job: [id number]

## 2011-02-07 ENCOUNTER — Other Ambulatory Visit: Payer: Self-pay | Admitting: Oncology

## 2011-02-07 ENCOUNTER — Encounter (HOSPITAL_BASED_OUTPATIENT_CLINIC_OR_DEPARTMENT_OTHER): Payer: Medicare Other | Admitting: Oncology

## 2011-02-07 DIAGNOSIS — C50419 Malignant neoplasm of upper-outer quadrant of unspecified female breast: Secondary | ICD-10-CM

## 2011-02-07 DIAGNOSIS — C569 Malignant neoplasm of unspecified ovary: Secondary | ICD-10-CM

## 2011-02-07 DIAGNOSIS — Z452 Encounter for adjustment and management of vascular access device: Secondary | ICD-10-CM

## 2011-02-07 LAB — CBC WITH DIFFERENTIAL/PLATELET
BASO%: 0.4 % (ref 0.0–2.0)
Basophils Absolute: 0 10*3/uL (ref 0.0–0.1)
EOS%: 2.9 % (ref 0.0–7.0)
Eosinophils Absolute: 0.1 10*3/uL (ref 0.0–0.5)
HCT: 34.5 % — ABNORMAL LOW (ref 34.8–46.6)
HGB: 12.1 g/dL (ref 11.6–15.9)
LYMPH%: 29.4 % (ref 14.0–49.7)
MCH: 33.4 pg (ref 25.1–34.0)
MCHC: 34.9 g/dL (ref 31.5–36.0)
MCV: 95.7 fL (ref 79.5–101.0)
MONO#: 0.3 10*3/uL (ref 0.1–0.9)
MONO%: 7.7 % (ref 0.0–14.0)
NEUT#: 2.5 10*3/uL (ref 1.5–6.5)
NEUT%: 59.6 % (ref 38.4–76.8)
Platelets: 107 10*3/uL — ABNORMAL LOW (ref 145–400)
RBC: 3.6 10*6/uL — ABNORMAL LOW (ref 3.70–5.45)
RDW: 13.2 % (ref 11.2–14.5)
WBC: 4.3 10*3/uL (ref 3.9–10.3)
lymph#: 1.3 10*3/uL (ref 0.9–3.3)

## 2011-02-07 LAB — CA 125: CA 125: 8.6 U/mL (ref 0.0–30.2)

## 2011-03-14 ENCOUNTER — Encounter: Payer: Medicare Other | Admitting: Oncology

## 2011-03-24 ENCOUNTER — Ambulatory Visit: Payer: Medicare Other | Attending: Gynecology | Admitting: Gynecology

## 2011-03-24 DIAGNOSIS — C569 Malignant neoplasm of unspecified ovary: Secondary | ICD-10-CM | POA: Insufficient documentation

## 2011-03-24 DIAGNOSIS — Z79899 Other long term (current) drug therapy: Secondary | ICD-10-CM | POA: Insufficient documentation

## 2011-03-24 DIAGNOSIS — F17200 Nicotine dependence, unspecified, uncomplicated: Secondary | ICD-10-CM | POA: Insufficient documentation

## 2011-03-24 DIAGNOSIS — I10 Essential (primary) hypertension: Secondary | ICD-10-CM | POA: Insufficient documentation

## 2011-03-24 DIAGNOSIS — Z853 Personal history of malignant neoplasm of breast: Secondary | ICD-10-CM | POA: Insufficient documentation

## 2011-03-28 NOTE — Consult Note (Signed)
  NAMECASI, WESTERFELD NO.:  [id number]  MEDICAL RECORD NO.:  [id number]  LOCATION:  GYN                          FACILITY:  [facility]  PHYSICIAN:  De Blanch, M.D.DATE OF BIRTH:  Sep 13, 1941  DATE OF CONSULTATION:  03/24/2011 DATE OF DISCHARGE:                                CONSULTATION   CHIEF COMPLAINT:  Ovarian cancer.  INTERVAL HISTORY:  The patient returns today for continuing followup. She is now 6 years since initial diagnosis of a stage IIIC ovarian cancer and remains free of disease.  Since her last visit, she has done well.  She denies any GI or GU symptoms.  She has no pelvic pain, pressure, vaginal bleeding, or discharge.  Functional status is excellent.  She had been followed by Dr. Darrold Span for breast cancer which apparently is also in remission.  Her CA-125 on Feb 07, 2011 was 9 units per mL.  HISTORY OF PRESENT ILLNESS:  The patient underwent initial debulking of a stage IIIC ovarian cancer in June of 2006.  She was optimally debulked, with complete resection of all visible tumor.  She subsequently received six cycles of carboplatin and Taxol chemotherapy. Preoperatively, her CA-125 was 2665 and fell to 8 units per mL at the completion of her therapy.  PAST MEDICAL HISTORY/MEDICAL ILLNESSES: 1. Hypertension. 2. Microinvasive breast cancer. 3. Ovarian cancer.  PAST SURGICAL HISTORY: 1. Appendectomy. 2. Tubal ligation. 3. Bladder suspension. 4. Laparoscopic cholecystectomy. 5. Ovarian cancer debulking. 6. Breast lumpectomy.  DRUG ALLERGIES:  None.  CURRENT MEDICATIONS:  Arimidex and Diovan.  OBSTETRICAL HISTORY:  Gravida 2.  FAMILY HISTORY:  Mother with ovarian, and aunts with breast cancer, colon cancers including the patient's brother and maternal aunts.  SOCIAL HISTORY:  The patient is widowed.  She smokes one pack per day. She does not drink.  REVIEW OF SYSTEMS:  Ten-point comprehensive review of systems  negative except as noted above.  PHYSICAL EXAMINATION:  VITAL SIGNS:  Weight 181 pounds, blood pressure 138/72, pulse 84. GENERAL:  The patient is a healthy African-American female, in no acute distress. HEENT:  Negative. NECK:  Supple without thyromegaly.  There is no supraclavicular or inguinal adenopathy. ABDOMEN:  Soft, nontender.  No mass, organomegaly, ascites, or hernias are noted. PELVIC:  EGBUS, vagina, bladder, urethra are normal.  Cervix and uterus are surgically absent.  No lesions noted.  Bimanual and rectovaginal exam reveal no masses, induration, or nodularity. LOWER EXTREMITIES:  Without edema or varicosities.  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 No evidence of recurrent disease.  PLAN:  The patient will continue to be followed by Dr. Darrold Span for her breast cancer and will have a CA-125 done at least annually.  I will plan on seeing the patient back again in 1 year for continuing followup.     De Blanch, M.D.     DC/MEDQ  D:  03/24/2011  T:  03/24/2011  Job:  [id number]  cc:   Telford Nab, R.N. [address] [place], [place] [address]  Merlene Laughter. Renae Gloss, M.D. Fax: [phone]  Reece Packer, M.D. Fax: [phone]  Electronically Signed by De Blanch M.D. on 03/28/2011 01:59:20 PM

## 2011-04-04 ENCOUNTER — Encounter (HOSPITAL_BASED_OUTPATIENT_CLINIC_OR_DEPARTMENT_OTHER): Payer: Medicare Other | Admitting: Oncology

## 2011-04-04 DIAGNOSIS — C569 Malignant neoplasm of unspecified ovary: Secondary | ICD-10-CM

## 2011-04-04 DIAGNOSIS — C50419 Malignant neoplasm of upper-outer quadrant of unspecified female breast: Secondary | ICD-10-CM

## 2011-04-04 DIAGNOSIS — Z452 Encounter for adjustment and management of vascular access device: Secondary | ICD-10-CM

## 2011-07-24 ENCOUNTER — Other Ambulatory Visit: Payer: Self-pay | Admitting: Oncology

## 2011-07-24 DIAGNOSIS — C569 Malignant neoplasm of unspecified ovary: Secondary | ICD-10-CM

## 2011-07-24 MED ORDER — HEPARIN SOD (PORK) LOCK FLUSH 100 UNIT/ML IV SOLN
500.0000 [IU] | Freq: Once | INTRAVENOUS | Status: DC
  Filled 2011-07-24: qty 5

## 2011-07-24 MED ORDER — SODIUM CHLORIDE 0.9 % IJ SOLN
10.0000 mL | INTRAMUSCULAR | Status: DC | PRN
  Filled 2011-07-24: qty 10

## 2011-07-30 ENCOUNTER — Other Ambulatory Visit: Payer: Self-pay | Admitting: Oncology

## 2011-07-30 DIAGNOSIS — C569 Malignant neoplasm of unspecified ovary: Secondary | ICD-10-CM

## 2011-07-30 MED ORDER — HEPARIN SOD (PORK) LOCK FLUSH 100 UNIT/ML IV SOLN
500.0000 [IU] | Freq: Once | INTRAVENOUS | Status: DC
  Filled 2011-07-30: qty 5

## 2011-07-30 MED ORDER — SODIUM CHLORIDE 0.9 % IJ SOLN
10.0000 mL | INTRAMUSCULAR | Status: DC | PRN
  Filled 2011-07-30: qty 10

## 2011-08-01 ENCOUNTER — Ambulatory Visit (HOSPITAL_BASED_OUTPATIENT_CLINIC_OR_DEPARTMENT_OTHER): Payer: Medicare Other

## 2011-08-01 DIAGNOSIS — Z452 Encounter for adjustment and management of vascular access device: Secondary | ICD-10-CM

## 2011-08-01 DIAGNOSIS — C50419 Malignant neoplasm of upper-outer quadrant of unspecified female breast: Secondary | ICD-10-CM

## 2011-08-01 DIAGNOSIS — C569 Malignant neoplasm of unspecified ovary: Secondary | ICD-10-CM

## 2011-08-01 MED ORDER — SODIUM CHLORIDE 0.9 % IJ SOLN
10.0000 mL | Freq: Once | INTRAMUSCULAR | Status: AC
  Administered 2011-08-01: 10 mL via INTRAVENOUS
  Filled 2011-08-01: qty 10

## 2011-08-01 MED ORDER — HEPARIN SOD (PORK) LOCK FLUSH 100 UNIT/ML IV SOLN
500.0000 [IU] | Freq: Once | INTRAVENOUS | Status: AC
  Administered 2011-08-01: 500 [IU] via INTRAVENOUS
  Filled 2011-08-01: qty 5

## 2011-09-25 ENCOUNTER — Other Ambulatory Visit: Payer: Self-pay | Admitting: *Deleted

## 2011-09-25 DIAGNOSIS — C50419 Malignant neoplasm of upper-outer quadrant of unspecified female breast: Secondary | ICD-10-CM

## 2011-09-25 MED ORDER — CLONIDINE HCL 0.1 MG PO TABS: 0.1000 mg | ORAL_TABLET | Freq: Every day | ORAL | Status: DC

## 2011-09-26 ENCOUNTER — Other Ambulatory Visit: Payer: Self-pay | Admitting: Oncology

## 2011-09-26 ENCOUNTER — Ambulatory Visit (HOSPITAL_BASED_OUTPATIENT_CLINIC_OR_DEPARTMENT_OTHER): Payer: Medicare Other

## 2011-09-26 ENCOUNTER — Other Ambulatory Visit (HOSPITAL_BASED_OUTPATIENT_CLINIC_OR_DEPARTMENT_OTHER): Payer: Medicare Other | Admitting: Lab

## 2011-09-26 DIAGNOSIS — C50419 Malignant neoplasm of upper-outer quadrant of unspecified female breast: Secondary | ICD-10-CM

## 2011-09-26 DIAGNOSIS — C569 Malignant neoplasm of unspecified ovary: Secondary | ICD-10-CM

## 2011-09-26 DIAGNOSIS — Z452 Encounter for adjustment and management of vascular access device: Secondary | ICD-10-CM

## 2011-09-26 LAB — CBC WITH DIFFERENTIAL/PLATELET
BASO%: 0.3 % (ref 0.0–2.0)
Basophils Absolute: 0 10*3/uL (ref 0.0–0.1)
EOS%: 2.1 % (ref 0.0–7.0)
Eosinophils Absolute: 0.1 10*3/uL (ref 0.0–0.5)
HCT: 42 % (ref 34.8–46.6)
HGB: 11.5 g/dL — ABNORMAL LOW (ref 11.6–15.9)
LYMPH%: 25 % (ref 14.0–49.7)
MCH: 32.2 pg (ref 25.1–34.0)
MCHC: 27.4 g/dL — ABNORMAL LOW (ref 31.5–36.0)
MCV: 117.6 fL — ABNORMAL HIGH (ref 79.5–101.0)
MONO#: 0.5 10*3/uL (ref 0.1–0.9)
MONO%: 8.2 % (ref 0.0–14.0)
NEUT#: 4.3 10*3/uL (ref 1.5–6.5)
NEUT%: 64.4 % (ref 38.4–76.8)
Platelets: 135 10*3/uL — ABNORMAL LOW (ref 145–400)
RBC: 3.57 10*6/uL — ABNORMAL LOW (ref 3.70–5.45)
RDW: 15.7 % — ABNORMAL HIGH (ref 11.2–14.5)
WBC: 6.6 10*3/uL (ref 3.9–10.3)
lymph#: 1.7 10*3/uL (ref 0.9–3.3)
nRBC: 0 % (ref 0–0)

## 2011-09-26 LAB — COMPREHENSIVE METABOLIC PANEL
ALT: 11 U/L (ref 0–35)
AST: 14 U/L (ref 0–37)
Albumin: 3.5 g/dL (ref 3.5–5.2)
Alkaline Phosphatase: 48 U/L (ref 39–117)
BUN: 11 mg/dL (ref 6–23)
CO2: 25 mEq/L (ref 19–32)
Calcium: 8.3 mg/dL — ABNORMAL LOW (ref 8.4–10.5)
Chloride: 109 mEq/L (ref 96–112)
Creatinine, Ser: 0.9 mg/dL (ref 0.50–1.10)
Glucose, Bld: 87 mg/dL (ref 70–99)
Potassium: 3.3 mEq/L — ABNORMAL LOW (ref 3.5–5.3)
Sodium: 145 mEq/L (ref 135–145)
Total Bilirubin: 0.4 mg/dL (ref 0.3–1.2)
Total Protein: 6.7 g/dL (ref 6.0–8.3)

## 2011-09-26 LAB — CA 125: CA 125: 10.4 U/mL (ref 0.0–30.2)

## 2011-09-26 MED ORDER — SODIUM CHLORIDE 0.9 % IJ SOLN
10.0000 mL | INTRAMUSCULAR | Status: DC | PRN
  Administered 2011-09-26: 10 mL via INTRAVENOUS
  Filled 2011-09-26: qty 10

## 2011-09-26 MED ORDER — HEPARIN SOD (PORK) LOCK FLUSH 100 UNIT/ML IV SOLN
500.0000 [IU] | Freq: Once | INTRAVENOUS | Status: AC
  Administered 2011-09-26: 500 [IU] via INTRAVENOUS
  Filled 2011-09-26: qty 5

## 2011-09-26 NOTE — Patient Instructions (Signed)
Call MD for problems 

## 2011-09-26 NOTE — Progress Notes (Signed)
Portacath: Right chest. Unable to get blood return.  Placement checked by 2 RN's.  Lab drawn from left antecubital.Right

## 2011-09-29 ENCOUNTER — Encounter: Payer: Self-pay | Admitting: Physician Assistant

## 2011-09-29 ENCOUNTER — Telehealth: Payer: Self-pay | Admitting: Oncology

## 2011-09-29 ENCOUNTER — Ambulatory Visit (HOSPITAL_BASED_OUTPATIENT_CLINIC_OR_DEPARTMENT_OTHER): Payer: Medicare Other | Admitting: Physician Assistant

## 2011-09-29 DIAGNOSIS — C50919 Malignant neoplasm of unspecified site of unspecified female breast: Secondary | ICD-10-CM | POA: Insufficient documentation

## 2011-09-29 DIAGNOSIS — C569 Malignant neoplasm of unspecified ovary: Secondary | ICD-10-CM | POA: Diagnosis not present

## 2011-09-29 DIAGNOSIS — Z23 Encounter for immunization: Secondary | ICD-10-CM

## 2011-09-29 DIAGNOSIS — E876 Hypokalemia: Secondary | ICD-10-CM | POA: Diagnosis not present

## 2011-09-29 HISTORY — DX: Malignant neoplasm of unspecified site of unspecified female breast: C50.919

## 2011-09-29 HISTORY — DX: Malignant neoplasm of unspecified ovary: C56.9

## 2011-09-29 MED ORDER — INFLUENZA VIRUS VACC SPLIT PF IM SUSP
0.5000 mL | INTRAMUSCULAR | Status: AC | PRN
  Administered 2011-09-29: 0.5 mL via INTRAMUSCULAR

## 2011-09-29 NOTE — Telephone Encounter (Signed)
Pt will be having mammogram @ [facility] on 10/31/11 0945am pt aware of appt.

## 2011-10-02 NOTE — Progress Notes (Signed)
No images are attached to the encounter. No scans are attached to the encounter. No scans are attached to the encounter. [facility] OFFICE PROGRESS NOTE  No primary provider on file. No primary provider on file.  DIAGNOSIS: 1. 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 IIIC ovarian carcinoma  PRIOR THERAPY: 1. status post lumpectomy followed by local radiation. She was intolerant to Femara and Aromasin due to severe arthralgias 2. status post surgical treatment for her ovarian carcinoma followed by adjuvant Taxol carboplatinum, status post 6 cycles  CURRENT THERAPY: Tamoxifen 20 mg by mouth every other day  INTERVAL HISTORY: Monique Watson 71 y.o. female returns for a scheduled regular office visit for followup of her history of breast cancer and history of ovarian carcinoma. Overall she's been doing well since her last office visit. She requests a flu shot today. She continues on over-the-counter potassium 1 tablet daily as instructed by her primary care physician, Dr. Andi Devon. She is due for her mammograms in early February. She still has her Port-A-Cath in place and is of a mind to keep it for now.   MEDICAL HISTORY: Past Medical History  Diagnosis Date  . Breast cancer 09/29/2011  . Ovarian cancer 09/29/2011    ALLERGIES:  is allergic to diovan.  MEDICATIONS:  Current Outpatient Prescriptions  Medication Sig Dispense Refill  . cloNIDine (CATAPRES) 0.1 MG tablet Take 1 tablet (0.1 mg total) by mouth daily.  30 tablet  0    SURGICAL HISTORY: History reviewed. No pertinent past surgical history.  REVIEW OF SYSTEMS:  A comprehensive review of systems was negative.   PHYSICAL EXAMINATION: General appearance: alert, cooperative, appears stated age and no distress Head: Normocephalic, without obvious abnormality, atraumatic Neck: no adenopathy, no carotid bruit, no JVD, supple, symmetrical, trachea midline and thyroid not  enlarged, symmetric, no tenderness/mass/nodules Lymph nodes: Cervical, supraclavicular, and axillary nodes normal. Resp: clear to auscultation bilaterally Cardio: regular rate and rhythm, S1, S2 normal, no murmur, click, rub or gallop GI: soft, non-tender; bowel sounds normal; no masses,  no organomegaly Extremities: extremities normal, atraumatic, no cyanosis or edema Neurologic: Alert and oriented X 3, normal strength and tone. Normal symmetric reflexes. Normal coordination and gait  ECOG PERFORMANCE STATUS: 0 - Asymptomatic  Blood pressure 131/73, pulse 87, temperature 98.6 F (37 C), height 5\' 6"  (1.676 m), weight 179 lb 4.8 oz (81.33 kg).  LABORATORY DATA: Lab Results  Component Value Date   WBC 6.6 09/26/2011   HGB 11.5* 09/26/2011   HCT 42.0 09/26/2011   MCV 117.6* 09/26/2011   PLT 135 Large & giant platelets* 09/26/2011      Chemistry      Component Value Date/Time   NA 145 09/26/2011 0927   K 3.3* 09/26/2011 0927   CL 109 09/26/2011 0927   CO2 25 09/26/2011 0927   BUN 11 09/26/2011 0927   CREATININE 0.90 09/26/2011 0927      Component Value Date/Time   CALCIUM 8.3* 09/26/2011 0927   ALKPHOS 48 09/26/2011 0927   AST 14 09/26/2011 0927   ALT 11 09/26/2011 0927   BILITOT 0.4 09/26/2011 0927       RADIOGRAPHIC STUDIES:  No results found.   ASSESSMENT/PLAN: The patient is a very pleasant 71 year old African American female with a history of multifocal T1 N0 ER/PR positive HER-2 negative left breast cancer as well as a history of IIIc ovarian carcinoma the patient was discussed with Dr. Darrold Span. 1. For her history of  breast cancer she will continue on tamoxifen. She will be scheduled for bilateral mammograms at [facility] approximately 10/28/2011. She will need MRI of the breasts within 3 months after her mammograms we will set this up from our office.  2. Regarding her history of her ovarian carcinoma treated adjuvantly with the chemotherapy as described above. She'll followup with Dr. Jimmey Ralph sooner  one of his associates in July of 2013. 3.We'll continue her Port-A-Cath flushes. We will schedule this for March 5, April 30, and June 25 labs from her Port-A-Cath consisting of a CBC differential C. met and CA 125. These labs will be available for 6 month followup for GYN oncology. 4. She'll followup with Dr. Darrold Span in 6 months 5. She is given a flu vaccine today. 6. regarding her hypokalemia she is asked to increase your over-the-counter potassium tablets to 2 a day for the next 10 days and then resume her usual 1 tablet daily. She is to followup with her primary care physician as scheduled for further followup     Dvid Pendry E, PA-C     All questions were answered. The patient knows to call the clinic with any problems, questions or concerns. We can certainly see the patient much sooner if necessary.

## 2011-10-25 ENCOUNTER — Telehealth: Payer: Self-pay | Admitting: Oncology

## 2011-10-25 NOTE — Telephone Encounter (Signed)
Talked to Monique Watson with [facility], pt will be schedule in July and she will call pt to notifiy her when schedule comes out.

## 2011-10-28 DIAGNOSIS — F17200 Nicotine dependence, unspecified, uncomplicated: Secondary | ICD-10-CM | POA: Diagnosis present

## 2011-10-28 DIAGNOSIS — R079 Chest pain, unspecified: Secondary | ICD-10-CM | POA: Diagnosis not present

## 2011-10-28 DIAGNOSIS — Z8543 Personal history of malignant neoplasm of ovary: Secondary | ICD-10-CM

## 2011-10-28 DIAGNOSIS — E876 Hypokalemia: Secondary | ICD-10-CM | POA: Diagnosis present

## 2011-10-28 DIAGNOSIS — I10 Essential (primary) hypertension: Secondary | ICD-10-CM | POA: Diagnosis not present

## 2011-10-28 DIAGNOSIS — J680 Bronchitis and pneumonitis due to chemicals, gases, fumes and vapors: Principal | ICD-10-CM | POA: Diagnosis present

## 2011-10-28 DIAGNOSIS — D696 Thrombocytopenia, unspecified: Secondary | ICD-10-CM | POA: Diagnosis present

## 2011-10-28 DIAGNOSIS — R0602 Shortness of breath: Secondary | ICD-10-CM | POA: Diagnosis not present

## 2011-10-28 DIAGNOSIS — J209 Acute bronchitis, unspecified: Secondary | ICD-10-CM | POA: Diagnosis present

## 2011-10-28 DIAGNOSIS — C50919 Malignant neoplasm of unspecified site of unspecified female breast: Secondary | ICD-10-CM | POA: Diagnosis present

## 2011-10-28 DIAGNOSIS — J440 Chronic obstructive pulmonary disease with acute lower respiratory infection: Secondary | ICD-10-CM | POA: Diagnosis not present

## 2011-10-28 DIAGNOSIS — T5991XA Toxic effect of unspecified gases, fumes and vapors, accidental (unintentional), initial encounter: Secondary | ICD-10-CM | POA: Diagnosis present

## 2011-10-29 ENCOUNTER — Encounter (HOSPITAL_COMMUNITY): Payer: Self-pay | Admitting: Emergency Medicine

## 2011-10-29 ENCOUNTER — Emergency Department (HOSPITAL_COMMUNITY): Payer: Medicare Other

## 2011-10-29 ENCOUNTER — Inpatient Hospital Stay (HOSPITAL_COMMUNITY)
Admission: EM | Admit: 2011-10-29 | Discharge: 2011-10-31 | DRG: 206 | Disposition: A | Discharge status: Home / Self Care | Payer: Medicare Other | Attending: Internal Medicine | Admitting: Internal Medicine

## 2011-10-29 ENCOUNTER — Other Ambulatory Visit: Payer: Self-pay

## 2011-10-29 DIAGNOSIS — F17200 Nicotine dependence, unspecified, uncomplicated: Secondary | ICD-10-CM | POA: Diagnosis not present

## 2011-10-29 DIAGNOSIS — C50919 Malignant neoplasm of unspecified site of unspecified female breast: Secondary | ICD-10-CM | POA: Diagnosis present

## 2011-10-29 DIAGNOSIS — J441 Chronic obstructive pulmonary disease with (acute) exacerbation: Secondary | ICD-10-CM | POA: Diagnosis not present

## 2011-10-29 DIAGNOSIS — J680 Bronchitis and pneumonitis due to chemicals, gases, fumes and vapors: Secondary | ICD-10-CM

## 2011-10-29 DIAGNOSIS — J84114 Acute interstitial pneumonitis: Secondary | ICD-10-CM | POA: Diagnosis not present

## 2011-10-29 DIAGNOSIS — J209 Acute bronchitis, unspecified: Secondary | ICD-10-CM

## 2011-10-29 DIAGNOSIS — E876 Hypokalemia: Secondary | ICD-10-CM | POA: Diagnosis present

## 2011-10-29 DIAGNOSIS — J438 Other emphysema: Secondary | ICD-10-CM | POA: Diagnosis not present

## 2011-10-29 DIAGNOSIS — R0602 Shortness of breath: Secondary | ICD-10-CM | POA: Diagnosis not present

## 2011-10-29 DIAGNOSIS — J449 Chronic obstructive pulmonary disease, unspecified: Secondary | ICD-10-CM

## 2011-10-29 DIAGNOSIS — D696 Thrombocytopenia, unspecified: Secondary | ICD-10-CM | POA: Diagnosis present

## 2011-10-29 DIAGNOSIS — C569 Malignant neoplasm of unspecified ovary: Secondary | ICD-10-CM

## 2011-10-29 DIAGNOSIS — R079 Chest pain, unspecified: Secondary | ICD-10-CM | POA: Diagnosis not present

## 2011-10-29 DIAGNOSIS — C50419 Malignant neoplasm of upper-outer quadrant of unspecified female breast: Secondary | ICD-10-CM

## 2011-10-29 DIAGNOSIS — J470 Bronchiectasis with acute lower respiratory infection: Secondary | ICD-10-CM | POA: Diagnosis present

## 2011-10-29 DIAGNOSIS — Z8543 Personal history of malignant neoplasm of ovary: Secondary | ICD-10-CM | POA: Diagnosis not present

## 2011-10-29 DIAGNOSIS — I10 Essential (primary) hypertension: Secondary | ICD-10-CM | POA: Diagnosis present

## 2011-10-29 HISTORY — DX: Essential (primary) hypertension: I10

## 2011-10-29 HISTORY — DX: Unspecified osteoarthritis, unspecified site: M19.90

## 2011-10-29 HISTORY — DX: Pneumonia, unspecified organism: J18.9

## 2011-10-29 LAB — DIFFERENTIAL
Basophils Absolute: 0 10*3/uL (ref 0.0–0.1)
Basophils Relative: 0 % (ref 0–1)
Eosinophils Absolute: 0 10*3/uL (ref 0.0–0.7)
Eosinophils Relative: 0 % (ref 0–5)
Lymphocytes Relative: 11 % — ABNORMAL LOW (ref 12–46)
Lymphs Abs: 1.5 10*3/uL (ref 0.7–4.0)
Monocytes Absolute: 0.9 10*3/uL (ref 0.1–1.0)
Monocytes Relative: 7 % (ref 3–12)
Neutro Abs: 10.8 10*3/uL — ABNORMAL HIGH (ref 1.7–7.7)
Neutrophils Relative %: 81 % — ABNORMAL HIGH (ref 43–77)

## 2011-10-29 LAB — CBC
HCT: 35.7 % — ABNORMAL LOW (ref 36.0–46.0)
Hemoglobin: 12.4 g/dL (ref 12.0–15.0)
MCH: 32.4 pg (ref 26.0–34.0)
MCHC: 34.7 g/dL (ref 30.0–36.0)
MCV: 93.2 fL (ref 78.0–100.0)
Platelets: 120 10*3/uL — ABNORMAL LOW (ref 150–400)
RBC: 3.83 MIL/uL — ABNORMAL LOW (ref 3.87–5.11)
RDW: 13.9 % (ref 11.5–15.5)
WBC: 13.3 10*3/uL — ABNORMAL HIGH (ref 4.0–10.5)

## 2011-10-29 LAB — BASIC METABOLIC PANEL
BUN: 10 mg/dL (ref 6–23)
CO2: 22 mEq/L (ref 19–32)
Calcium: 9.3 mg/dL (ref 8.4–10.5)
Chloride: 105 mEq/L (ref 96–112)
Creatinine, Ser: 0.72 mg/dL (ref 0.50–1.10)
GFR calc Af Amer: >90 mL/min (ref >90)
GFR calc non Af Amer: 84 mL/min — ABNORMAL LOW (ref >90)
Glucose, Bld: 103 mg/dL — ABNORMAL HIGH (ref 70–99)
Potassium: 3.2 mEq/L — ABNORMAL LOW (ref 3.5–5.1)
Sodium: 139 mEq/L (ref 135–145)

## 2011-10-29 LAB — PRO B NATRIURETIC PEPTIDE: Pro B Natriuretic peptide (BNP): 78.4 pg/mL (ref 0–125)

## 2011-10-29 LAB — LACTIC ACID, PLASMA: Lactic Acid, Venous: 2.2 mmol/L (ref 0.5–2.2)

## 2011-10-29 LAB — POCT I-STAT TROPONIN I: Troponin i, poc: 0 ng/mL (ref 0.00–0.08)

## 2011-10-29 LAB — D-DIMER, QUANTITATIVE (NOT AT ARMC): D-Dimer, Quant: 0.42 ug/mL-FEU (ref 0.00–0.48)

## 2011-10-29 MED ORDER — SODIUM CHLORIDE 0.9 % IV SOLN: 250.0000 mL | INTRAVENOUS | Status: DC | PRN

## 2011-10-29 MED ORDER — POTASSIUM CHLORIDE CRYS ER 20 MEQ PO TBCR
40.0000 meq | EXTENDED_RELEASE_TABLET | Freq: Once | ORAL | Status: AC
  Administered 2011-10-29: 40 meq via ORAL
  Filled 2011-10-29: qty 2

## 2011-10-29 MED ORDER — METHYLPREDNISOLONE SODIUM SUCC 125 MG IJ SOLR
60.0000 mg | Freq: Four times a day (QID) | INTRAMUSCULAR | Status: DC
  Administered 2011-10-29 – 2011-10-30 (×4): 60 mg via INTRAVENOUS
  Filled 2011-10-29 (×6): qty 0.96

## 2011-10-29 MED ORDER — DEXTROSE 5 % IV SOLN
1.0000 g | INTRAVENOUS | Status: DC
  Administered 2011-10-29: 1 g via INTRAVENOUS
  Filled 2011-10-29: qty 10

## 2011-10-29 MED ORDER — ACETAMINOPHEN 325 MG PO TABS
650.0000 mg | ORAL_TABLET | Freq: Four times a day (QID) | ORAL | Status: DC | PRN
  Administered 2011-10-31: 650 mg via ORAL
  Filled 2011-10-29: qty 2

## 2011-10-29 MED ORDER — ALUM & MAG HYDROXIDE-SIMETH 200-200-20 MG/5ML PO SUSP
30.0000 mL | Freq: Four times a day (QID) | ORAL | Status: DC | PRN
  Filled 2011-10-29: qty 30

## 2011-10-29 MED ORDER — METHYLPREDNISOLONE SODIUM SUCC 125 MG IJ SOLR
125.0000 mg | Freq: Once | INTRAMUSCULAR | Status: AC
  Administered 2011-10-29: 125 mg via INTRAVENOUS
  Filled 2011-10-29: qty 2

## 2011-10-29 MED ORDER — ALBUTEROL SULFATE (5 MG/ML) 0.5% IN NEBU
5.0000 mg | INHALATION_SOLUTION | Freq: Once | RESPIRATORY_TRACT | Status: AC
  Administered 2011-10-29: 5 mg via RESPIRATORY_TRACT
  Filled 2011-10-29: qty 1

## 2011-10-29 MED ORDER — SODIUM CHLORIDE 0.9 % IJ SOLN
3.0000 mL | Freq: Two times a day (BID) | INTRAMUSCULAR | Status: DC
  Administered 2011-10-29: 3 mL via INTRAVENOUS

## 2011-10-29 MED ORDER — FLUTICASONE PROPIONATE 50 MCG/ACT NA SUSP
2.0000 | Freq: Every day | NASAL | Status: DC | PRN
  Filled 2011-10-29: qty 16

## 2011-10-29 MED ORDER — FERROUS FUMARATE 325 (106 FE) MG PO TABS
1.0000 | ORAL_TABLET | ORAL | Status: DC
  Administered 2011-10-29: 106 mg via ORAL
  Filled 2011-10-29 (×2): qty 1

## 2011-10-29 MED ORDER — SODIUM CHLORIDE 0.9 % IJ SOLN
3.0000 mL | Freq: Two times a day (BID) | INTRAMUSCULAR | Status: DC
  Administered 2011-10-30 – 2011-10-31 (×3): 3 mL via INTRAVENOUS

## 2011-10-29 MED ORDER — SODIUM CHLORIDE 0.9 % IV SOLN
Freq: Once | INTRAVENOUS | Status: AC
  Administered 2011-10-29: 01:00:00 via INTRAVENOUS

## 2011-10-29 MED ORDER — TAMOXIFEN CITRATE 20 MG PO TABS
20.0000 mg | ORAL_TABLET | Freq: Every day | ORAL | Status: DC
  Administered 2011-10-29 – 2011-10-31 (×3): 20 mg via ORAL
  Filled 2011-10-29 (×5): qty 1

## 2011-10-29 MED ORDER — AMLODIPINE BESYLATE 2.5 MG PO TABS
2.5000 mg | ORAL_TABLET | Freq: Every day | ORAL | Status: DC
  Administered 2011-10-29 – 2011-10-31 (×3): 2.5 mg via ORAL
  Filled 2011-10-29 (×3): qty 1

## 2011-10-29 MED ORDER — ACETAMINOPHEN 650 MG RE SUPP: 650.0000 mg | Freq: Four times a day (QID) | RECTAL | Status: DC | PRN

## 2011-10-29 MED ORDER — CLONIDINE HCL 0.1 MG PO TABS
0.1000 mg | ORAL_TABLET | Freq: Every day | ORAL | Status: DC
  Administered 2011-10-29 – 2011-10-31 (×3): 0.1 mg via ORAL
  Filled 2011-10-29 (×3): qty 1

## 2011-10-29 MED ORDER — CHLORHEXIDINE GLUCONATE 0.12 % MT SOLN
15.0000 mL | Freq: Two times a day (BID) | OROMUCOSAL | Status: DC
  Administered 2011-10-29 – 2011-10-31 (×6): 15 mL via OROMUCOSAL
  Filled 2011-10-29 (×9): qty 15

## 2011-10-29 MED ORDER — ZOLPIDEM TARTRATE 5 MG PO TABS: 5.0000 mg | ORAL_TABLET | Freq: Every evening | ORAL | Status: DC | PRN

## 2011-10-29 MED ORDER — POTASSIUM CHLORIDE CRYS ER 10 MEQ PO TBCR
10.0000 meq | EXTENDED_RELEASE_TABLET | Freq: Every day | ORAL | Status: DC
  Administered 2011-10-29 – 2011-10-31 (×3): 10 meq via ORAL
  Filled 2011-10-29 (×3): qty 1

## 2011-10-29 MED ORDER — SODIUM CHLORIDE 0.9 % IJ SOLN
3.0000 mL | INTRAMUSCULAR | Status: DC | PRN
  Administered 2011-10-29: 3 mL via INTRAVENOUS

## 2011-10-29 MED ORDER — ALBUTEROL SULFATE (5 MG/ML) 0.5% IN NEBU
2.5000 mg | INHALATION_SOLUTION | Freq: Four times a day (QID) | RESPIRATORY_TRACT | Status: DC
  Administered 2011-10-29 – 2011-10-31 (×8): 2.5 mg via RESPIRATORY_TRACT
  Filled 2011-10-29 (×8): qty 0.5

## 2011-10-29 MED ORDER — IPRATROPIUM BROMIDE 0.02 % IN SOLN
0.5000 mg | Freq: Once | RESPIRATORY_TRACT | Status: AC
  Administered 2011-10-29: 0.5 mg via RESPIRATORY_TRACT
  Filled 2011-10-29: qty 2.5

## 2011-10-29 MED ORDER — ONDANSETRON HCL 4 MG PO TABS: 4.0000 mg | ORAL_TABLET | Freq: Four times a day (QID) | ORAL | Status: DC | PRN

## 2011-10-29 MED ORDER — AMLODIPINE BESYLATE 2.5 MG PO TABS
2.5000 mg | ORAL_TABLET | ORAL | Status: DC
  Filled 2011-10-29: qty 1

## 2011-10-29 MED ORDER — DEXTROSE 5 % IV SOLN
500.0000 mg | INTRAVENOUS | Status: DC
  Administered 2011-10-29: 500 mg via INTRAVENOUS
  Filled 2011-10-29: qty 500

## 2011-10-29 MED ORDER — METHYLPREDNISOLONE SODIUM SUCC 125 MG IJ SOLR: 125.0000 mg | Freq: Two times a day (BID) | INTRAMUSCULAR | Status: DC

## 2011-10-29 MED ORDER — AZITHROMYCIN 500 MG IV SOLR
500.0000 mg | INTRAVENOUS | Status: DC
  Administered 2011-10-29 – 2011-10-31 (×3): 500 mg via INTRAVENOUS
  Filled 2011-10-29 (×5): qty 500

## 2011-10-29 MED ORDER — VITAMIN E 180 MG (400 UNIT) PO CAPS
400.0000 [IU] | ORAL_CAPSULE | Freq: Two times a day (BID) | ORAL | Status: DC
  Administered 2011-10-29 – 2011-10-31 (×5): 400 [IU] via ORAL
  Filled 2011-10-29 (×6): qty 1

## 2011-10-29 MED ORDER — VITAMIN D 1000 UNITS PO TABS
2000.0000 [IU] | ORAL_TABLET | Freq: Every day | ORAL | Status: DC
  Administered 2011-10-29 – 2011-10-31 (×3): 2000 [IU] via ORAL
  Filled 2011-10-29 (×3): qty 2

## 2011-10-29 MED ORDER — HYDROMORPHONE HCL PF 1 MG/ML IJ SOLN: 0.5000 mg | INTRAMUSCULAR | Status: DC | PRN

## 2011-10-29 MED ORDER — BIOTENE DRY MOUTH MT LIQD
15.0000 mL | Freq: Two times a day (BID) | OROMUCOSAL | Status: DC
  Administered 2011-10-29 – 2011-10-31 (×6): 15 mL via OROMUCOSAL

## 2011-10-29 MED ORDER — OMEGA-3-ACID ETHYL ESTERS 1 G PO CAPS
1.0000 g | ORAL_CAPSULE | Freq: Every day | ORAL | Status: DC
  Administered 2011-10-29 – 2011-10-30 (×2): 1 g via ORAL
  Filled 2011-10-29 (×4): qty 1

## 2011-10-29 MED ORDER — CALCIUM CARBONATE-VITAMIN D 500-200 MG-UNIT PO TABS
1.0000 | ORAL_TABLET | Freq: Two times a day (BID) | ORAL | Status: DC
  Administered 2011-10-29 – 2011-10-31 (×5): 1 via ORAL
  Filled 2011-10-29 (×8): qty 1

## 2011-10-29 MED ORDER — ALBUTEROL SULFATE (5 MG/ML) 0.5% IN NEBU
2.5000 mg | INHALATION_SOLUTION | RESPIRATORY_TRACT | Status: DC | PRN
Start: 2011-10-29 — End: 2011-10-31

## 2011-10-29 MED ORDER — METHYLPREDNISOLONE SODIUM SUCC 125 MG IJ SOLR
125.0000 mg | Freq: Four times a day (QID) | INTRAMUSCULAR | Status: DC
  Administered 2011-10-29: 125 mg via INTRAVENOUS
  Filled 2011-10-29: qty 2

## 2011-10-29 MED ORDER — DEXTROSE 5 % IV SOLN
1.0000 g | INTRAVENOUS | Status: DC
  Administered 2011-10-30 – 2011-10-31 (×2): 1 g via INTRAVENOUS
  Filled 2011-10-29 (×3): qty 10

## 2011-10-29 MED ORDER — ONDANSETRON HCL 4 MG/2ML IJ SOLN: 4.0000 mg | Freq: Four times a day (QID) | INTRAMUSCULAR | Status: DC | PRN

## 2011-10-29 MED ORDER — OXYCODONE HCL 5 MG PO TABS: 5.0000 mg | ORAL_TABLET | ORAL | Status: DC | PRN

## 2011-10-29 MED ORDER — NICOTINE 14 MG/24HR TD PT24
14.0000 mg | MEDICATED_PATCH | Freq: Every day | TRANSDERMAL | Status: DC
  Administered 2011-10-29 – 2011-10-31 (×3): 14 mg via TRANSDERMAL
  Filled 2011-10-29 (×4): qty 1

## 2011-10-29 NOTE — ED Notes (Signed)
Patient complaining of shortness of breath; states that it started this morning and that it progressively has gotten worse.  Patient complaining of chest pain, which radiates down her sides and to her abdomen.  Describes pain as "soreness" and that it occurs only during inspiration.  Patient's respirations even, labored, and shallow.

## 2011-10-29 NOTE — ED Provider Notes (Signed)
History     CSN: [id number]  Arrival date & time 10/28/11  2357   First MD Initiated Contact with Patient 10/29/11 0044      Chief Complaint  Patient presents with  . Shortness of Breath    (Consider location/radiation/quality/duration/timing/severity/associated sxs/prior treatment) Patient is a 71 y.o. female presenting with shortness of breath. The history is provided by the patient and the spouse.  Shortness of Breath  Associated symptoms include shortness of breath.   Patient here with shortness of breath x24 hours has gotten progressively worse. Patient notes cough has been productive of yellow sputum. Also describes sharp chest pain worse with cough and inspiration dyspnea. on exertion, denies any lower extremity edema. No medications used prior to arrival. No prior history of heart failure. Denies any urinary symptoms. Past Medical History  Diagnosis Date  . Breast cancer 09/29/2011  . Ovarian cancer 09/29/2011    History reviewed. No pertinent past surgical history.  History reviewed. No pertinent family history.  History  Substance Use Topics  . Smoking status: Current Everyday Smoker -- 0.2 packs/day for 58 years  . Smokeless tobacco: Not on file  . Alcohol Use:     OB History    Grav Para Term Preterm Abortions TAB SAB Ect Mult Living                  Review of Systems  Respiratory: Positive for shortness of breath.   All other systems reviewed and are negative.    Allergies  Diovan and Uncoded nonscreenable allergen  Home Medications   Current Outpatient Rx  Name Route Sig Dispense Refill  . AMLODIPINE BESYLATE 2.5 MG PO TABS Oral Take 2.5 mg by mouth 2 (two) times a week.    Marland Kitchen CALCIUM 600 + D PO Oral Take 1 tablet by mouth 2 (two) times daily.    Marland Kitchen VITAMIN D3 2000 UNITS PO TABS Oral Take 1 capsule by mouth daily.    Marland Kitchen CLONIDINE HCL 0.1 MG PO TABS Oral Take 1 tablet (0.1 mg total) by mouth daily. 30 tablet 0  . FERROUS FUMARATE 325 (106 FE) MG PO  TABS Oral Take 1 tablet by mouth every 7 (seven) days. No certain day    . FLUTICASONE PROPIONATE 50 MCG/ACT NA SUSP Nasal Place 2 sprays into the nose daily as needed. For nasal drainage    . IBUPROFEN 800 MG PO TABS Oral Take 800 mg by mouth every 8 (eight) hours as needed. For pain    . LIDOCAINE-PRILOCAINE 2.5-2.5 % EX CREA Topical Apply 1 application topically as needed. For port    . LOSARTAN POTASSIUM 50 MG PO TABS Oral Take 50 mg by mouth daily.    Marland Kitchen FISH OIL CONCENTRATE 1000 MG PO CAPS Oral Take 1 capsule by mouth daily.    Marland Kitchen POTASSIUM CHLORIDE ER 8 MEQ PO CPCR Oral Take 8 mEq by mouth daily. 99 mg (OTC preparation)    . TAMOXIFEN CITRATE 20 MG PO TABS Oral Take 20 mg by mouth daily. One tablet by mouth every other day    . VITAMIN E 400 UNITS PO CAPS Oral Take 400 Units by mouth 2 (two) times daily.      BP 168/72  Pulse 99  Temp(Src) 98.5 F (36.9 C) (Oral)  Resp 22  Ht 5\' 6"  (1.676 m)  Wt 173 lb (78.472 kg)  BMI 27.92 kg/m2  SpO2 100%  Physical Exam  Nursing note and vitals reviewed. Constitutional: She is oriented to person,  place, and time. She appears well-developed and well-nourished.  Non-toxic appearance. No distress.  HENT:  Head: Normocephalic and atraumatic.  Eyes: Conjunctivae, EOM and lids are normal. Pupils are equal, round, and reactive to light.  Neck: Normal range of motion. Neck supple. No tracheal deviation present. No mass present.  Cardiovascular: Regular rhythm and normal heart sounds.  Tachycardia present.  Exam reveals no gallop.   No murmur heard. Pulmonary/Chest: Effort normal. No stridor. No respiratory distress. She has decreased breath sounds. She has wheezes. She has rhonchi. She has no rales.  Abdominal: Soft. Normal appearance and bowel sounds are normal. She exhibits no distension. There is no tenderness. There is no rebound and no CVA tenderness.  Musculoskeletal: Normal range of motion. She exhibits no edema and no tenderness.    Neurological: She is alert and oriented to person, place, and time. She has normal strength. No cranial nerve deficit or sensory deficit. GCS eye subscore is 4. GCS verbal subscore is 5. GCS motor subscore is 6.  Skin: Skin is warm and dry. No abrasion and no rash noted.  Psychiatric: Her behavior is normal. Her mood appears anxious. Her speech is rapid and/or pressured.    ED Course  Procedures (including critical care time)  Labs Reviewed - No data to display No results found.   No diagnosis found.    MDM  Patient given albuterol and Atrovent treatment. Also given Solu-Medrol. Suspect patient might have pneumonia she has low-grade temperature, light blood cell count is elevated--Will start patient on empiric antibiotics and patient will be admitted        Toy Baker, MD 10/29/11 [phone]

## 2011-10-29 NOTE — H&P (Addendum)
DATE OF ADMISSION:  10/29/2011  PCP:   Andi Devon MD  Chief Complaint:  SOB  HPI: Monique Watson is an 71 y.o. female who presents to the ED with complaint of worsening SOB after being exposed to fumes from a cleaning solution while the floors were being cleaned at her church.  When she left church afterward she could not catch her breath and had burning pain with each breath.  She reports having increased chest congestion and coughing for the past 3 days,but her symptoms worsened after being around the fumes.  She is a smoker and smoke 1/2 pack of cigarettes daily and has smoked sin the age of 42.  She was evaluated in the ED and diagnosed with a pneumonitis, and was placed on empiric antibiotic therapy and IV steroids and supplemental oxygen and referred for medical admission.    She has had the seasonal Influenzae vaccine and is up to date with her pneumovax vaccine.    Past Medical History  Diagnosis Date  . Breast cancer 09/29/2011  . Ovarian cancer 09/29/2011  . Arthritis   . Pneumonia   . Hypertension 10/29/2011    History reviewed. No pertinent past surgical history.  Medications:  HOME MEDS: Prior to Admission medications   Medication Sig Start Date End Date Taking? Authorizing Provider  amLODipine (NORVASC) 2.5 MG tablet Take 2.5 mg by mouth 2 (two) times a week.   Yes Historical Provider, MD  Calcium Carbonate-Vitamin D (CALCIUM 600 + D PO) Take 1 tablet by mouth 2 (two) times daily.   Yes Historical Provider, MD  Cholecalciferol (VITAMIN D3) 2000 UNITS TABS Take 1 capsule by mouth daily.   Yes Historical Provider, MD  cloNIDine (CATAPRES) 0.1 MG tablet Take 1 tablet (0.1 mg total) by mouth daily. 09/25/11 09/24/12 Yes Lennis Buzzy Han, MD  ferrous fumarate (HEMOCYTE - 106 MG FE) 325 (106 FE) MG TABS Take 1 tablet by mouth every 7 (seven) days. No certain day   Yes Historical Provider, MD  fluticasone (FLONASE) 50 MCG/ACT nasal spray Place 2 sprays into the nose daily as  needed. For nasal drainage   Yes Historical Provider, MD  ibuprofen (ADVIL,MOTRIN) 800 MG tablet Take 800 mg by mouth every 8 (eight) hours as needed. For pain   Yes Historical Provider, MD  lidocaine-prilocaine (EMLA) cream Apply 1 application topically as needed. For port   Yes Historical Provider, MD  losartan (COZAAR) 50 MG tablet Take 50 mg by mouth daily.   Yes Historical Provider, MD  Omega-3 Fatty Acids (FISH OIL CONCENTRATE) 1000 MG CAPS Take 1 capsule by mouth daily.   Yes Historical Provider, MD  Potassium Chloride CR (MICRO-K) 8 MEQ CPCR Take 8 mEq by mouth daily. 99 mg (OTC preparation)   Yes Historical Provider, MD  tamoxifen (NOLVADEX) 20 MG tablet Take 20 mg by mouth daily. One tablet by mouth every other day   Yes Historical Provider, MD  vitamin E (VITAMIN E) 400 UNIT capsule Take 400 Units by mouth 2 (two) times daily.   Yes Historical Provider, MD    Allergies:  Allergies  Allergen Reactions  . Diovan (Valsartan) Other (See Comments)    arthralgias  . Uncoded Nonscreenable Allergen     Blood pressure medication patient stated that she has a low tolerance for    Social History:   reports that she has been smoking.  She does not have any smokeless tobacco history on file. She reports that she drinks about .6 ounces of alcohol  per week. She reports that she does not use illicit drugs.  Family History: History reviewed. No pertinent family history.  Review of Systems:  The patient denies anorexia, fever, weight loss,, vision loss, decreased hearing, hoarseness, chest pain, syncope, dyspnea on exertion, peripheral edema, balance deficits, hemoptysis, abdominal pain, melena, hematochezia, severe indigestion/heartburn, hematuria, incontinence, genital sores, muscle weakness, suspicious skin lesions, transient blindness, difficulty walking, depression, unusual weight change, abnormal bleeding, enlarged lymph nodes, angioedema, and breast masses.   Physical Exam:  GEN:   Pleasant 71 year old well nourished and well developed African American Female examined  and in no acute distress; cooperative with exam Filed Vitals:   10/29/11 0300 10/29/11 0300 10/29/11 0400 10/29/11 0550  BP: 143/80 143/80 161/71 160/73  Pulse: 102 102 97 101  Temp:    97.3 F (36.3 C)  TempSrc:    Oral  Resp: 31 22 17 20   Height:      Weight:      SpO2: 97% 97% 100% 100%   Blood pressure 160/73, pulse 101, temperature 97.3 F (36.3 C), temperature source Oral, resp. rate 20, height 5\' 6"  (1.676 m), weight 78.472 kg (173 lb), SpO2 100.00%. PSYCH: She is alert and oriented x4; does not appear anxious does not appear depressed; affect is normal HEENT: Normocephalic and Atraumatic, Mucous membranes pink; PERRLA; EOM intact; Fundi:  Benign;  No scleral icterus, Nares: Patent, Oropharynx: Clear, Neck:  FROM, no cervical lymphadenopathy nor thyromegaly or carotid bruit; no JVD; Breasts:: Not examined CHEST WALL: No tenderness CHEST: Normal respiration, clear to auscultation bilaterally HEART: Regular rate and rhythm; no murmurs rubs or gallops BACK: No kyphosis or scoliosis; no CVA tenderness ABDOMEN: Positive Bowel Sounds, soft non-tender; no masses, no organomegaly.   Rectal Exam: Not done EXTREMITIES: No cyanosis, clubbing or edema; no ulcerations. Genitalia: not examined PULSES: 2+ and symmetric SKIN: Normal hydration no rash or ulceration CNS: Cranial nerves 2-12 grossly intact no focal neurologic deficit   Labs & Imaging Results for orders placed during the hospital encounter of 10/29/11 (from the past 48 hour(s))  LACTIC ACID, PLASMA     Status: Normal   Collection Time   10/29/11  1:06 AM      Component Value Range Comment   Lactic Acid, Venous 2.2  0.5 - 2.2 (mmol/L)   CBC     Status: Abnormal   Collection Time   10/29/11  1:07 AM      Component Value Range Comment   WBC 13.3 (*) 4.0 - 10.5 (K/uL)    RBC 3.83 (*) 3.87 - 5.11 (MIL/uL)    Hemoglobin 12.4  12.0 - 15.0  (g/dL)    HCT 21.3 (*) 08.6 - 46.0 (%)    MCV 93.2  78.0 - 100.0 (fL)    MCH 32.4  26.0 - 34.0 (pg)    MCHC 34.7  30.0 - 36.0 (g/dL)    RDW 57.8  46.9 - 62.9 (%)    Platelets 120 (*) 150 - 400 (K/uL)   DIFFERENTIAL     Status: Abnormal   Collection Time   10/29/11  1:07 AM      Component Value Range Comment   Neutrophils Relative 81 (*) 43 - 77 (%)    Neutro Abs 10.8 (*) 1.7 - 7.7 (K/uL)    Lymphocytes Relative 11 (*) 12 - 46 (%)    Lymphs Abs 1.5  0.7 - 4.0 (K/uL)    Monocytes Relative 7  3 - 12 (%)    Monocytes Absolute  0.9  0.1 - 1.0 (K/uL)    Eosinophils Relative 0  0 - 5 (%)    Eosinophils Absolute 0.0  0.0 - 0.7 (K/uL)    Basophils Relative 0  0 - 1 (%)    Basophils Absolute 0.0  0.0 - 0.1 (K/uL)   BASIC METABOLIC PANEL     Status: Abnormal   Collection Time   10/29/11  1:07 AM      Component Value Range Comment   Sodium 139  135 - 145 (mEq/L)    Potassium 3.2 (*) 3.5 - 5.1 (mEq/L)    Chloride 105  96 - 112 (mEq/L)    CO2 22  19 - 32 (mEq/L)    Glucose, Bld 103 (*) 70 - 99 (mg/dL)    BUN 10  6 - 23 (mg/dL)    Creatinine, Ser 4.09  0.50 - 1.10 (mg/dL)    Calcium 9.3  8.4 - 10.5 (mg/dL)    GFR calc non Af Amer 84 (*) >90 (mL/min)    GFR calc Af Amer >90  >90 (mL/min)   PRO B NATRIURETIC PEPTIDE     Status: Normal   Collection Time   10/29/11  1:07 AM      Component Value Range Comment   Pro B Natriuretic peptide (BNP) 78.4  0 - 125 (pg/mL)   D-DIMER, QUANTITATIVE     Status: Normal   Collection Time   10/29/11  1:07 AM      Component Value Range Comment   D-Dimer, Quant 0.42  0.00 - 0.48 (ug/mL-FEU)   POCT I-STAT TROPONIN I     Status: Normal   Collection Time   10/29/11  1:13 AM      Component Value Range Comment   Troponin i, poc 0.00  0.00 - 0.08 (ng/mL)    Comment 3             Dg Chest Port 1 View  10/29/2011  *RADIOLOGY REPORT*  Clinical Data: Chest pain on inspiration.  Shortness of breath.  PORTABLE CHEST - 1 VIEW  Comparison: 07/11/2010  Findings: Shallow  inspiration.  Heart size and pulmonary vascularity are normal for technique.  Since the previous study, there appears to be some interval development of nodular interstitial changes in the lung bases which might represent early pneumonitis or edema.  No focal airspace consolidation.  No blunting of costophrenic angles.  No pneumothorax.  Right central venous catheter remains unchanged in position.  Surgical clips in the left axilla.  IMPRESSION: Shallow inspiration.  Suggestion of developing interstitial changes in the lung bases which might represent interstitial pneumonitis or edema.  Original Report Authenticated By: Marlon Pel, M.D.      Assessment: Present on Admission:  .SOB (shortness of breath) .Acute bronchitis .Tobacco use disorder .Hypertension  Pneumonitis  Plan:    Telemetry monitoring IV Steroid Taper Empiric Antibiotics IV rocephin and Azithromycin Nebs, and Supplemental Oxygen Home Medications reconciled.   DVT prophylaxis Other plans as per orders.    CODE STATUS:      FULL CODE         Reef Achterberg C 10/29/2011, 8:21 AM

## 2011-10-29 NOTE — Progress Notes (Signed)
Subjective:   Chart reviewed. Patient indicates that she feels much better than on admission and is able to breathe better. She feels her dyspnea is 75% improved. She smokes half a pack of cigarettes per day and is requesting a nicotine patch.  Objective  Vital signs in last 24 hours: Filed Vitals:   10/29/11 0300 10/29/11 0400 10/29/11 0550 10/29/11 0919  BP: 143/80 161/71 160/73   Pulse: 102 97 101   Temp:   97.3 F (36.3 C)   TempSrc:   Oral   Resp: 22 17 20    Height:      Weight:      SpO2: 97% 100% 100% 100%   Weight change:   Intake/Output Summary (Last 24 hours) at 10/29/11 1332 Last data filed at 10/29/11 0900  Gross per 24 hour  Intake    240 ml  Output      0 ml  Net    240 ml    Physical Exam:  General Exam: Comfortable.  Respiratory System: Decreased breath sounds bilaterally with few bilateral expiratory rhonchi. No increased work of breathing.  Cardiovascular System: First and second heart sounds heard. Regular rate and rhythm. No JVD/murmurs. Telemetry shows sinus rhythm. Gastrointestinal System: Abdomen is non distended, soft and normal bowel sounds heard.  Central Nervous System: Alert and oriented. No focal neurological deficits.  Labs:  Basic Metabolic Panel:  Lab 10/29/11 1610  NA 139  K 3.2*  CL 105  CO2 22  GLUCOSE 103*  BUN 10  CREATININE 0.72  CALCIUM 9.3  ALB --  PHOS --   Liver Function Tests: No results found for this basename: AST:3,ALT:3,ALKPHOS:3,BILITOT:3,PROT:3,ALBUMIN:3 in the last 168 hours No results found for this basename: LIPASE:3,AMYLASE:3 in the last 168 hours No results found for this basename: AMMONIA:3 in the last 168 hours CBC:  Lab 10/29/11 0107  WBC 13.3*  NEUTROABS 10.8*  HGB 12.4  HCT 35.7*  MCV 93.2  PLT 120*   Cardiac Enzymes: No results found for this basename: CKTOTAL:5,CKMB:5,CKMBINDEX:5,TROPONINI:5 in the last 168 hours CBG: No results found for this basename: GLUCAP:5 in the last 168  hours  Iron Studies: No results found for this basename: IRON,TIBC,TRANSFERRIN,FERRITIN in the last 72 hours Studies/Results: Dg Chest Port 1 View  10/29/2011  *RADIOLOGY REPORT*  Clinical Data: Chest pain on inspiration.  Shortness of breath.  PORTABLE CHEST - 1 VIEW  Comparison: 07/11/2010  Findings: Shallow inspiration.  Heart size and pulmonary vascularity are normal for technique.  Since the previous study, there appears to be some interval development of nodular interstitial changes in the lung bases which might represent early pneumonitis or edema.  No focal airspace consolidation.  No blunting of costophrenic angles.  No pneumothorax.  Right central venous catheter remains unchanged in position.  Surgical clips in the left axilla.  IMPRESSION: Shallow inspiration.  Suggestion of developing interstitial changes in the lung bases which might represent interstitial pneumonitis or edema.  Original Report Authenticated By: Marlon Pel, M.D.   Medications:      . sodium chloride   Intravenous Once  . albuterol  2.5 mg Nebulization Q6H  . albuterol  5 mg Nebulization Once  . albuterol  5 mg Nebulization Once  . amLODipine  2.5 mg Oral 2 times weekly  . antiseptic oral rinse  15 mL Mouth Rinse q12n4p  . azithromycin  500 mg Intravenous Q24H  . azithromycin  500 mg Intravenous Q24H  . calcium-vitamin D  1 tablet Oral BID  . cefTRIAXone (  ROCEPHIN)  IV  1 g Intravenous Q24H  . cefTRIAXone (ROCEPHIN)  IV  1 g Intravenous Q24H  . chlorhexidine  15 mL Mouth Rinse BID  . cholecalciferol  2,000 Units Oral Daily  . cloNIDine  0.1 mg Oral Daily  . ferrous fumarate  1 tablet Oral Q7 days  . ipratropium  0.5 mg Nebulization Once  . methylPREDNISolone (SOLU-MEDROL) injection  125 mg Intravenous Once  . methylPREDNISolone (SOLU-MEDROL) injection  125 mg Intravenous Q6H  . methylPREDNISolone (SOLU-MEDROL) injection  125 mg Intravenous Q12H  . omega-3 acid ethyl esters  1 g Oral QHS  .  potassium chloride  10 mEq Oral Daily  . potassium chloride  40 mEq Oral Once  . sodium chloride  3 mL Intravenous Q12H  . sodium chloride  3 mL Intravenous Q12H  . tamoxifen  20 mg Oral Daily  . vitamin E  400 Units Oral BID    I  have reviewed scheduled and prn medications.     Problem/Plan: Principal Problem:  *SOB (shortness of breath) Active Problems:  Pneumonitis due to fumes and vapors  Acute bronchitis  Tobacco use disorder  Hypertension  1. Dyspnea secondary to acute exacerbation of COPD and? Pneumonitis from chemical inhalation: Clinically improved. Continue IV Solu-Medrol, bronchodilator nebulizations, IV Rocephin and Zithromax and oxygen. Monitor. 2. Hypokalemia: Repleted. Follow BMP tomorrow. 3. Tobacco abuse: Cessation counseled. Patient requests nicotine patch and will be provided. 4. Uncontrolled hypertension: Continue by mouth clonidine. We'll change amlodipine 2 daily. 5. Thrombocytopenia: Unclear etiology. Follow CBCs tomorrow. 6. History of breast and ovarian cancer: Continue tamoxifen.  Monique Watson 10/29/2011,1:32 PM  LOS: 0 days

## 2011-10-29 NOTE — ED Notes (Signed)
Per Dr. Freida Busman, we are holding the patient in the ER until the admitting MD sees the patient and writes admission orders.  Charge RN is aware.

## 2011-10-29 NOTE — ED Notes (Signed)
Called and gave report to [place].

## 2011-10-29 NOTE — ED Notes (Signed)
Admitting MD at bedside.

## 2011-10-30 ENCOUNTER — Other Ambulatory Visit: Payer: Self-pay

## 2011-10-30 DIAGNOSIS — R0602 Shortness of breath: Secondary | ICD-10-CM | POA: Diagnosis not present

## 2011-10-30 DIAGNOSIS — F17200 Nicotine dependence, unspecified, uncomplicated: Secondary | ICD-10-CM | POA: Diagnosis not present

## 2011-10-30 DIAGNOSIS — J441 Chronic obstructive pulmonary disease with (acute) exacerbation: Secondary | ICD-10-CM | POA: Diagnosis not present

## 2011-10-30 DIAGNOSIS — J84114 Acute interstitial pneumonitis: Secondary | ICD-10-CM | POA: Diagnosis not present

## 2011-10-30 DIAGNOSIS — C50919 Malignant neoplasm of unspecified site of unspecified female breast: Secondary | ICD-10-CM

## 2011-10-30 LAB — BASIC METABOLIC PANEL
BUN: 13 mg/dL (ref 6–23)
CO2: 22 mEq/L (ref 19–32)
Calcium: 10 mg/dL (ref 8.4–10.5)
Chloride: 109 mEq/L (ref 96–112)
Creatinine, Ser: 0.6 mg/dL (ref 0.50–1.10)
GFR calc Af Amer: >90 mL/min (ref >90)
GFR calc non Af Amer: 89 mL/min — ABNORMAL LOW (ref >90)
Glucose, Bld: 155 mg/dL — ABNORMAL HIGH (ref 70–99)
Potassium: 3.6 mEq/L (ref 3.5–5.1)
Sodium: 141 mEq/L (ref 135–145)

## 2011-10-30 LAB — CBC
HCT: 32.6 % — ABNORMAL LOW (ref 36.0–46.0)
Hemoglobin: 11.2 g/dL — ABNORMAL LOW (ref 12.0–15.0)
MCH: 32 pg (ref 26.0–34.0)
MCHC: 34.4 g/dL (ref 30.0–36.0)
MCV: 93.1 fL (ref 78.0–100.0)
Platelets: 124 10*3/uL — ABNORMAL LOW (ref 150–400)
RBC: 3.5 MIL/uL — ABNORMAL LOW (ref 3.87–5.11)
RDW: 14.1 % (ref 11.5–15.5)
WBC: 9.8 10*3/uL (ref 4.0–10.5)

## 2011-10-30 MED ORDER — PREDNISONE 50 MG PO TABS
50.0000 mg | ORAL_TABLET | Freq: Every day | ORAL | Status: DC
  Administered 2011-10-30 – 2011-10-31 (×2): 50 mg via ORAL
  Filled 2011-10-30 (×3): qty 1

## 2011-10-30 MED ORDER — TAMOXIFEN CITRATE 20 MG PO TABS: 20.0000 mg | ORAL_TABLET | Freq: Every day | ORAL | Status: DC

## 2011-10-30 NOTE — Progress Notes (Signed)
Patient ambulated on room air - o2 sats stayed 96-97% tolerated well.  Mervin Hack rn

## 2011-10-30 NOTE — Progress Notes (Signed)
Subjective:   Feels better, but still has some dyspnea on exertion and breathing is not yet at baseline. No chest pain. Cough productive of mild clear sputum.  Objective  Vital signs in last 24 hours: Filed Vitals:   10/30/11 0916 10/30/11 1000 10/30/11 1400 10/30/11 1449  BP:  144/63 134/56   Pulse:  100 73   Temp:  98.6 F (37 C) 98.9 F (37.2 C)   TempSrc:  Oral Oral   Resp:  20 20   Height:      Weight:      SpO2: 95% 98% 100% 97%   Weight change: 1.728 kg (3 lb 12.9 oz)  Intake/Output Summary (Last 24 hours) at 10/30/11 1607 Last data filed at 10/30/11 1500  Gross per 24 hour  Intake   1250 ml  Output   2800 ml  Net  -1550 ml    Physical Exam:  General Exam: Comfortable.  Respiratory System: Improving breath sounds bilaterally with few bilateral expiratory rhonchi. No increased work of breathing. Occasional basal crackles the  Cardiovascular System: First and second heart sounds heard. Regular rate and rhythm. No JVD/murmurs. Telemetry shows sinus rhythm. Gastrointestinal System: Abdomen is non distended, soft and normal bowel sounds heard.  Central Nervous System: Alert and oriented. No focal neurological deficits.  Labs:  Basic Metabolic Panel:  Lab 10/30/11 7829 10/29/11 0107  NA 141 139  K 3.6 3.2*  CL 109 105  CO2 22 22  GLUCOSE 155* 103*  BUN 13 10  CREATININE 0.60 0.72  CALCIUM 10.0 9.3  ALB -- --  PHOS -- --   Liver Function Tests: No results found for this basename: AST:3,ALT:3,ALKPHOS:3,BILITOT:3,PROT:3,ALBUMIN:3 in the last 168 hours No results found for this basename: LIPASE:3,AMYLASE:3 in the last 168 hours No results found for this basename: AMMONIA:3 in the last 168 hours CBC:  Lab 10/30/11 0530 10/29/11 0107  WBC 9.8 13.3*  NEUTROABS -- 10.8*  HGB 11.2* 12.4  HCT 32.6* 35.7*  MCV 93.1 93.2  PLT 124* 120*   Cardiac Enzymes: No results found for this basename: CKTOTAL:5,CKMB:5,CKMBINDEX:5,TROPONINI:5 in the last 168  hours CBG: No results found for this basename: GLUCAP:5 in the last 168 hours  Iron Studies: No results found for this basename: IRON,TIBC,TRANSFERRIN,FERRITIN in the last 72 hours Studies/Results: Dg Chest Port 1 View  10/29/2011  *RADIOLOGY REPORT*  Clinical Data: Chest pain on inspiration.  Shortness of breath.  PORTABLE CHEST - 1 VIEW  Comparison: 07/11/2010  Findings: Shallow inspiration.  Heart size and pulmonary vascularity are normal for technique.  Since the previous study, there appears to be some interval development of nodular interstitial changes in the lung bases which might represent early pneumonitis or edema.  No focal airspace consolidation.  No blunting of costophrenic angles.  No pneumothorax.  Right central venous catheter remains unchanged in position.  Surgical clips in the left axilla.  IMPRESSION: Shallow inspiration.  Suggestion of developing interstitial changes in the lung bases which might represent interstitial pneumonitis or edema.  Original Report Authenticated By: Marlon Pel, M.D.   Medications:      . albuterol  2.5 mg Nebulization Q6H  . amLODipine  2.5 mg Oral Daily  . antiseptic oral rinse  15 mL Mouth Rinse q12n4p  . azithromycin  500 mg Intravenous Q24H  . calcium-vitamin D  1 tablet Oral BID  . cefTRIAXone (ROCEPHIN)  IV  1 g Intravenous Q24H  . chlorhexidine  15 mL Mouth Rinse BID  . cholecalciferol  2,000 Units  Oral Daily  . cloNIDine  0.1 mg Oral Daily  . ferrous fumarate  1 tablet Oral Q7 days  . methylPREDNISolone (SOLU-MEDROL) injection  60 mg Intravenous Q6H  . nicotine  14 mg Transdermal Daily  . omega-3 acid ethyl esters  1 g Oral QHS  . potassium chloride  10 mEq Oral Daily  . sodium chloride  3 mL Intravenous Q12H  . sodium chloride  3 mL Intravenous Q12H  . tamoxifen  20 mg Oral Daily  . vitamin E  400 Units Oral BID    I  have reviewed scheduled and prn medications.     Problem/Plan: Principal Problem:  *SOB  (shortness of breath) Active Problems:  Pneumonitis due to fumes and vapors  Acute bronchitis  Tobacco use disorder  Hypertension  1. Dyspnea secondary to acute exacerbation of COPD and? Pneumonitis from chemical inhalation: Clinically improved. We'll change steroids to by mouth and continue bronchodilator nebulizations, IV Rocephin and Zithromax and oxygen. Monitor. 2. Hypokalemia: Repleted. 3. Tobacco abuse: Cessation counseled. Continue nicotine patch 4. Uncontrolled hypertension: Continue by mouth clonidine and amlodipine. 5. Thrombocytopenia: Unclear etiology. Stable.? Secondary to tamoxifen. 6. Anemia: Follow CBC tomorrow 7. History of breast and ovarian cancer: Continue tamoxifen.  Disposition: Possible discharge in the next 24-48 hours.   HONGALGI,ANAND 10/30/2011,4:07 PM  LOS: 1 day

## 2011-10-31 ENCOUNTER — Other Ambulatory Visit: Payer: Self-pay | Admitting: Physician Assistant

## 2011-10-31 DIAGNOSIS — R0602 Shortness of breath: Secondary | ICD-10-CM | POA: Diagnosis not present

## 2011-10-31 DIAGNOSIS — F17200 Nicotine dependence, unspecified, uncomplicated: Secondary | ICD-10-CM | POA: Diagnosis not present

## 2011-10-31 DIAGNOSIS — J441 Chronic obstructive pulmonary disease with (acute) exacerbation: Secondary | ICD-10-CM | POA: Diagnosis not present

## 2011-10-31 LAB — CBC
HCT: 34.1 % — ABNORMAL LOW (ref 36.0–46.0)
Hemoglobin: 11.5 g/dL — ABNORMAL LOW (ref 12.0–15.0)
MCH: 31.7 pg (ref 26.0–34.0)
MCHC: 33.7 g/dL (ref 30.0–36.0)
MCV: 93.9 fL (ref 78.0–100.0)
Platelets: 135 10*3/uL — ABNORMAL LOW (ref 150–400)
RBC: 3.63 MIL/uL — ABNORMAL LOW (ref 3.87–5.11)
RDW: 14 % (ref 11.5–15.5)
WBC: 10.7 10*3/uL — ABNORMAL HIGH (ref 4.0–10.5)

## 2011-10-31 MED ORDER — TAMOXIFEN CITRATE 20 MG PO TABS: 20.0000 mg | ORAL_TABLET | Freq: Every day | ORAL | Status: DC

## 2011-10-31 MED ORDER — PREDNISONE 10 MG PO TABS: ORAL_TABLET | ORAL | Status: AC

## 2011-10-31 MED ORDER — NICOTINE 14 MG/24HR TD PT24: 1.0000 | MEDICATED_PATCH | Freq: Every day | TRANSDERMAL | Status: AC

## 2011-10-31 MED ORDER — ALBUTEROL 90 MCG/ACT IN AERS: 2.0000 | INHALATION_SPRAY | RESPIRATORY_TRACT | Status: DC | PRN

## 2011-10-31 NOTE — Progress Notes (Signed)
Discharge instructions were given,questions were answer,telemetry box and intravenous were d/c.pt. Ready to go home

## 2011-10-31 NOTE — Discharge Summary (Signed)
Discharge Summary  Monique Watson MR#: [id number]  DOB:December 15, 1940  Date of Admission: 10/29/2011 Date of Discharge: 10/31/2011  Patient's PCP: Alva Garnet., MD, MD  Attending Physician:Katara Griner  Consults: none.  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 She however complained of increased chest congestion and coughing 3 days prior to that but symptoms were made worse by the fumes. She presented to the emergency room where chest x-ray was suggestive of pneumonitis. Patient was admitted for further evaluation and management.  Discharge Medications Current Discharge Medication List    START taking these medications   Details  albuterol (PROVENTIL,VENTOLIN) 90 MCG/ACT inhaler Inhale 2 puffs into the lungs every 4 (four) hours as needed for wheezing or shortness of breath. Qty: 17 g, Refills: 1    nicotine (NICODERM CQ - DOSED IN MG/24 HOURS) 14 mg/24hr patch Place 1 patch onto the skin daily. Qty: 28 patch, Refills: 0    predniSONE (DELTASONE) 10 MG tablet 4 tablets daily for 2 days, then 3 tablets daily for 2 days, then 2 tablets daily for 2 days, then 1 tablet daily for 2 days, then stop. Qty: 20 tablet, Refills: 0    tamoxifen (NOLVADEX) 20 MG tablet Take 1 tablet (20 mg total) by mouth daily.   Associated Diagnoses: Breast cancer      CONTINUE these medications which have NOT CHANGED   Details  Calcium Carbonate-Vitamin D (CALCIUM 600 + D PO) Take 1 tablet by mouth 2 (two) times daily.    Cholecalciferol (VITAMIN D3) 2000 UNITS TABS  Take 1 capsule by mouth daily.    cloNIDine (CATAPRES) 0.1 MG tablet Take 1 tablet (0.1 mg total) by mouth daily. Qty: 30 tablet, Refills: 0   Associated Diagnoses: Malignant neoplasm of upper-outer quadrant of female breast    ferrous fumarate (HEMOCYTE - 106 MG FE) 325 (106 FE) MG TABS Take 1 tablet by mouth every 7 (seven) days. No certain day    fluticasone (FLONASE) 50 MCG/ACT nasal spray Place 2 sprays into the nose daily as needed. For nasal drainage    ibuprofen (ADVIL,MOTRIN) 800 MG tablet Take 800 mg by mouth every 8 (eight) hours as needed. For pain    lidocaine-prilocaine (EMLA) cream Apply 1 application topically as needed. For port    losartan (COZAAR) 50 MG tablet Take 50 mg by mouth daily.    Omega-3 Fatty Acids (FISH OIL CONCENTRATE) 1000 MG CAPS Take 1 capsule by mouth daily.    Potassium Chloride CR (MICRO-K) 8 MEQ CPCR Take 8 mEq by mouth daily. 99 mg (OTC preparation)    vitamin E (VITAMIN E) 400 UNIT capsule Take 400 Units by mouth 2 (two) times daily.      STOP taking these medications     amLODipine (NORVASC) 2.5 MG tablet Comments:  Reason for Stopping:          Hospital Course: 1. Dyspnea secondary to acute exacerbation of COPD and? Pneumonitis from chemical inhalation: Clinically improved. Patient will complete a total of 7 days of antibiotics and a steroid taper. She is advised to seek immediate medical attention if there is any decline in her condition.  2. Hypokalemia: Repleted. 3. Tobacco abuse: Cessation counseled. Continue nicotine patch as per patient's request. 4. Uncontrolled hypertension: Continue by mouth clonidine and ARB. Patient says that she has not been taking amlodipine because it causes joint pains. 5. 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 Stable.? Secondary to tamoxifen. 6. Anemia: Stable 7. History of breast and ovarian cancer: Continue tamoxifen which she says she takes 20 mg daily.   Day of Discharge BP 126/65   Pulse 85  Temp(Src) 98.5 F (36.9 C) (Oral)  Resp 20  Ht 5\' 6"  (1.676 m)  Wt 80.2 kg (176 lb 12.9 oz)  BMI 28.54 kg/m2  SpO2 95%  General Exam: Comfortable.  Respiratory System: Improving breath sounds bilaterally with few bilateral expiratory rhonchi. No increased work of breathing. Occasional basal crackles the  Cardiovascular System: First and second heart sounds heard. Regular rate and rhythm. No JVD/murmurs. Telemetry shows sinus rhythm.  Gastrointestinal System: Abdomen is non distended, soft and normal bowel sounds heard.  Central Nervous System: Alert and oriented. No focal neurological deficits.  Results for orders placed during the hospital encounter of 10/29/11 (from the past 48 hour(s))  BASIC METABOLIC PANEL     Status: Abnormal   Collection Time   10/30/11  5:30 AM      Component Value Range Comment   Sodium 141  135 - 145 (mEq/L)    Potassium 3.6  3.5 - 5.1 (mEq/L)    Chloride 109  96 - 112 (mEq/L)    CO2 22  19 - 32 (mEq/L)    Glucose, Bld 155 (*) 70 - 99 (mg/dL)    BUN 13  6 - 23 (mg/dL)    Creatinine, Ser 1.61  0.50 - 1.10 (mg/dL)    Calcium 09.6  8.4 - 10.5 (mg/dL)    GFR calc non Af Amer 89 (*) >90 (mL/min)    GFR calc Af Amer >90  >90 (mL/min)   CBC     Status: Abnormal   Collection Time   10/30/11  5:30 AM      Component Value Range Comment   WBC 9.8  4.0 - 10.5 (K/uL)    RBC 3.50 (*) 3.87 - 5.11 (MIL/uL)    Hemoglobin 11.2 (*) 12.0 - 15.0 (g/dL)    HCT 04.5 (*) 40.9 - 46.0 (%)    MCV 93.1  78.0 - 100.0 (fL)    MCH 32.0  26.0 - 34.0 (pg)    MCHC 34.4  30.0 - 36.0 (g/dL)    RDW 81.1  91.4 - 78.2 (%)    Platelets 124 (*) 150 - 400 (K/uL)   CBC     Status: Abnormal   Collection Time   10/31/11  5:55 AM      Component Value Range Comment   WBC 10.7 (*) 4.0 - 10.5 (K/uL)    RBC 3.63 (*) 3.87 - 5.11 (MIL/uL)    Hemoglobin 11.5 (*) 12.0 - 15.0 (g/dL)    HCT 95.6 (*) 21.3 - 46.0 (%)    MCV 93.9  78.0 - 100.0 (fL)    MCH 31.7  26.0 - 34.0 (pg)    MCHC  33.7  30.0 - 36.0 (g/dL)    RDW 08.6  57.8 - 46.9 (%)    Platelets 135 (*) 150 - 400 (K/uL)    Labs: 1. ProBNP 78.4. 2. Venous lactate 2.2 3. Blood cultures are negative to date.  Dg Chest Port 1 View  10/29/2011  *RADIOLOGY REPORT*  Clinical Data: Chest pain on inspiration.  Shortness of breath.  PORTABLE CHEST - 1 VIEW  Comparison: 07/11/2010  Findings: Shallow inspiration.  Heart size and pulmonary vascularity are normal for technique.  Since the previous study, there appears to be some interval development of nodular interstitial changes in the lung bases which might represent early pneumonitis or edema.  No focal airspace consolidation.  No blunting of costophrenic angles.  No pneumothorax.  Right central venous catheter remains unchanged in position.  Surgical clips in the left axilla.  IMPRESSION: Shallow inspiration.  Suggestion of developing interstitial changes in the lung bases which might represent interstitial pneumonitis or edema.  Original Report Authenticated By: Marlon Pel, M.D.     Disposition: discharged home in stable   Diet: Heart Healthy.  Activity: increase activity gradually.   Follow-up Appts: Discharge Orders    Future Appointments: Provider: Department: Dept Phone: Center:   11/21/2011 9:00 AM [facility] Nurse [facility] [phone] None   01/16/2012 9:00 AM [facility] [phone] None   03/12/2012 8:00 AM Devonne Doughty Dory Larsen [facility] [phone] None   03/12/2012 8:30 AM [facility] [phone] None   03/29/2012 9:30 AM Lennis Buzzy Han, MD [facility] [phone] None     Future Orders Please Complete By Expires   Diet - low sodium heart healthy      Increase activity slowly      Call MD for:  difficulty breathing, headache or visual disturbances         TESTS THAT NEED FOLLOW-UP None  Time spent on discharge, talking to the patient, and coordinating care: 30  mins.   SignedMarcellus Scott, MD 10/31/2011, 6:23 PM

## 2011-10-31 NOTE — Telephone Encounter (Signed)
TAMOXIFEN 20MG  DAILY. PHARMACY NOTIFIED

## 2011-10-31 NOTE — Progress Notes (Signed)
10/31/2011 Wenda Vanschaick SPARKS Case Management Note [phone]  Utilization review completed.  

## 2011-11-04 LAB — CULTURE, BLOOD (ROUTINE X 2)
Culture  Setup Time: 201302101114
Culture  Setup Time: 201302101114
Culture: NO GROWTH
Culture: NO GROWTH

## 2011-11-08 DIAGNOSIS — Z853 Personal history of malignant neoplasm of breast: Secondary | ICD-10-CM | POA: Diagnosis not present

## 2011-12-03 ENCOUNTER — Other Ambulatory Visit: Payer: Self-pay | Admitting: Oncology

## 2011-12-03 ENCOUNTER — Encounter: Payer: Self-pay | Admitting: Oncology

## 2011-12-03 DIAGNOSIS — C50919 Malignant neoplasm of unspecified site of unspecified female breast: Secondary | ICD-10-CM

## 2011-12-03 DIAGNOSIS — Z853 Personal history of malignant neoplasm of breast: Secondary | ICD-10-CM

## 2011-12-03 NOTE — Progress Notes (Signed)
Mammograms done at [facility] 11-08-2011. Request sent to schedulers for breast MRI within 3 mo of mammograms. Last breast MRI Jan 2011. Breast ca diagnosis 2010.

## 2011-12-06 ENCOUNTER — Telehealth: Payer: Self-pay | Admitting: Oncology

## 2011-12-06 ENCOUNTER — Telehealth: Payer: Self-pay

## 2011-12-06 ENCOUNTER — Other Ambulatory Visit: Payer: Self-pay | Admitting: Oncology

## 2011-12-06 NOTE — Telephone Encounter (Signed)
S/w louise regarding this pt's mri breast diagnosis needed to be changed to state hx of breast cancer per [facility] from the [facility]

## 2011-12-06 NOTE — Telephone Encounter (Signed)
FAXED SIGNED ORDER FOR BREAST MRI DATED 12-06-11 TO THE [facility].  SENT A COPY TO BE SCANNED INTO PT.'S EMR.

## 2011-12-06 NOTE — Telephone Encounter (Signed)
lmonvm of Monique Watson from the breast center regarding the pt needing a breast mri before may 20th

## 2011-12-08 ENCOUNTER — Ambulatory Visit: Payer: Medicare Other | Admitting: Gynecology

## 2012-01-15 ENCOUNTER — Telehealth: Payer: Self-pay

## 2012-01-15 NOTE — Telephone Encounter (Signed)
SPOKE WITH MS. Knierim AND TOLD HER THAT KATHY FROM THE [facility] HAS BEEN TRYING TO REACH HER TO SET UP BREAST MRI.   MS. Antonelli THOUGHT IT WAS HER MAMMOGRAM THAT WAS BEING SCHEDULED AND SHE HAD THAT DONE IN FEB.  MS. Castillo WILL CALL CATHY AT [facility]. PHONE [phone].  LM AT NUMBER ABOVE FOR CATHY EXPLAINING ABOVE.

## 2012-01-16 ENCOUNTER — Ambulatory Visit (HOSPITAL_BASED_OUTPATIENT_CLINIC_OR_DEPARTMENT_OTHER): Payer: Medicare Other

## 2012-01-16 VITALS — BP 114/79 | HR 70 | Temp 98.2°F

## 2012-01-16 DIAGNOSIS — C50919 Malignant neoplasm of unspecified site of unspecified female breast: Secondary | ICD-10-CM | POA: Diagnosis not present

## 2012-01-16 DIAGNOSIS — Z452 Encounter for adjustment and management of vascular access device: Secondary | ICD-10-CM | POA: Diagnosis not present

## 2012-01-16 MED ORDER — SODIUM CHLORIDE 0.9 % IJ SOLN
10.0000 mL | INTRAMUSCULAR | Status: DC | PRN
  Filled 2012-01-16: qty 10

## 2012-01-16 MED ORDER — HEPARIN SOD (PORK) LOCK FLUSH 100 UNIT/ML IV SOLN
500.0000 [IU] | Freq: Once | INTRAVENOUS | Status: DC
  Filled 2012-01-16: qty 5

## 2012-01-22 ENCOUNTER — Other Ambulatory Visit: Payer: Self-pay | Admitting: *Deleted

## 2012-01-22 DIAGNOSIS — C50419 Malignant neoplasm of upper-outer quadrant of unspecified female breast: Secondary | ICD-10-CM

## 2012-01-22 MED ORDER — CLONIDINE HCL 0.1 MG PO TABS: 0.1000 mg | ORAL_TABLET | Freq: Every day | ORAL | Status: DC

## 2012-01-24 ENCOUNTER — Ambulatory Visit
Admission: RE | Admit: 2012-01-24 | Discharge: 2012-01-24 | Disposition: A | Discharge status: Home / Self Care | Payer: Medicare Other | Source: Ambulatory Visit | Attending: Oncology | Admitting: Oncology

## 2012-01-24 DIAGNOSIS — Z8041 Family history of malignant neoplasm of ovary: Secondary | ICD-10-CM | POA: Diagnosis not present

## 2012-01-24 DIAGNOSIS — N6459 Other signs and symptoms in breast: Secondary | ICD-10-CM | POA: Diagnosis not present

## 2012-01-24 DIAGNOSIS — Z853 Personal history of malignant neoplasm of breast: Secondary | ICD-10-CM

## 2012-01-24 MED ORDER — GADOBENATE DIMEGLUMINE 529 MG/ML IV SOLN: 16.0000 mL | Freq: Once | INTRAVENOUS | Status: AC | PRN

## 2012-02-05 ENCOUNTER — Telehealth: Payer: Self-pay | Admitting: *Deleted

## 2012-02-05 NOTE — Telephone Encounter (Signed)
NOTIFIED PT. PER DR.LIVESAY HER BREAST MRI WAS FINE. PT. VOICES UNDERSTANDING.

## 2012-02-21 ENCOUNTER — Telehealth: Payer: Self-pay | Admitting: *Deleted

## 2012-02-21 NOTE — Telephone Encounter (Signed)
[facility] faxed request to dispense 90 day supply for clonidine.  Request granted.  Faxed authorization to [phone].

## 2012-02-22 ENCOUNTER — Other Ambulatory Visit: Payer: Self-pay | Admitting: *Deleted

## 2012-02-22 DIAGNOSIS — C50919 Malignant neoplasm of unspecified site of unspecified female breast: Secondary | ICD-10-CM

## 2012-02-22 MED ORDER — TAMOXIFEN CITRATE 20 MG PO TABS: 20.0000 mg | ORAL_TABLET | Freq: Every day | ORAL | Status: DC

## 2012-02-23 ENCOUNTER — Other Ambulatory Visit: Payer: Self-pay | Admitting: *Deleted

## 2012-02-23 DIAGNOSIS — C50419 Malignant neoplasm of upper-outer quadrant of unspecified female breast: Secondary | ICD-10-CM

## 2012-02-23 MED ORDER — CLONIDINE HCL 0.1 MG PO TABS: ORAL_TABLET | ORAL | Status: DC

## 2012-03-05 DIAGNOSIS — E559 Vitamin D deficiency, unspecified: Secondary | ICD-10-CM | POA: Diagnosis not present

## 2012-03-05 DIAGNOSIS — B372 Candidiasis of skin and nail: Secondary | ICD-10-CM | POA: Diagnosis not present

## 2012-03-05 DIAGNOSIS — C50919 Malignant neoplasm of unspecified site of unspecified female breast: Secondary | ICD-10-CM | POA: Diagnosis not present

## 2012-03-05 DIAGNOSIS — I10 Essential (primary) hypertension: Secondary | ICD-10-CM | POA: Diagnosis not present

## 2012-03-05 DIAGNOSIS — R7309 Other abnormal glucose: Secondary | ICD-10-CM | POA: Diagnosis not present

## 2012-03-05 DIAGNOSIS — Z124 Encounter for screening for malignant neoplasm of cervix: Secondary | ICD-10-CM | POA: Diagnosis not present

## 2012-03-05 DIAGNOSIS — Z01419 Encounter for gynecological examination (general) (routine) without abnormal findings: Secondary | ICD-10-CM | POA: Diagnosis not present

## 2012-03-05 DIAGNOSIS — Z0000 Encounter for general adult medical examination without abnormal findings: Secondary | ICD-10-CM | POA: Diagnosis not present

## 2012-03-12 ENCOUNTER — Other Ambulatory Visit (HOSPITAL_BASED_OUTPATIENT_CLINIC_OR_DEPARTMENT_OTHER): Payer: Medicare Other | Admitting: Lab

## 2012-03-12 ENCOUNTER — Ambulatory Visit (HOSPITAL_BASED_OUTPATIENT_CLINIC_OR_DEPARTMENT_OTHER): Payer: Medicare Other

## 2012-03-12 VITALS — BP 160/80 | HR 79 | Temp 97.4°F

## 2012-03-12 DIAGNOSIS — C569 Malignant neoplasm of unspecified ovary: Secondary | ICD-10-CM

## 2012-03-12 DIAGNOSIS — Z452 Encounter for adjustment and management of vascular access device: Secondary | ICD-10-CM

## 2012-03-12 DIAGNOSIS — C50419 Malignant neoplasm of upper-outer quadrant of unspecified female breast: Secondary | ICD-10-CM | POA: Diagnosis not present

## 2012-03-12 DIAGNOSIS — C50919 Malignant neoplasm of unspecified site of unspecified female breast: Secondary | ICD-10-CM | POA: Diagnosis not present

## 2012-03-12 LAB — CBC WITH DIFFERENTIAL/PLATELET
BASO%: 0.5 % (ref 0.0–2.0)
Basophils Absolute: 0 10*3/uL (ref 0.0–0.1)
EOS%: 1.2 % (ref 0.0–7.0)
Eosinophils Absolute: 0.1 10*3/uL (ref 0.0–0.5)
HCT: 35.8 % (ref 34.8–46.6)
HGB: 12.1 g/dL (ref 11.6–15.9)
LYMPH%: 14.2 % (ref 14.0–49.7)
MCH: 32.2 pg (ref 25.1–34.0)
MCHC: 33.8 g/dL (ref 31.5–36.0)
MCV: 95.3 fL (ref 79.5–101.0)
MONO#: 0.6 10*3/uL (ref 0.1–0.9)
MONO%: 6.5 % (ref 0.0–14.0)
NEUT#: 6.8 10*3/uL — ABNORMAL HIGH (ref 1.5–6.5)
NEUT%: 77.6 % — ABNORMAL HIGH (ref 38.4–76.8)
Platelets: 100 10*3/uL — ABNORMAL LOW (ref 145–400)
RBC: 3.76 10*6/uL (ref 3.70–5.45)
RDW: 14.3 % (ref 11.2–14.5)
WBC: 8.8 10*3/uL (ref 3.9–10.3)
lymph#: 1.2 10*3/uL (ref 0.9–3.3)

## 2012-03-12 LAB — COMPREHENSIVE METABOLIC PANEL
ALT: 10 U/L (ref 0–35)
AST: 15 U/L (ref 0–37)
Albumin: 3.7 g/dL (ref 3.5–5.2)
Alkaline Phosphatase: 47 U/L (ref 39–117)
BUN: 14 mg/dL (ref 6–23)
CO2: 27 mEq/L (ref 19–32)
Calcium: 8.6 mg/dL (ref 8.4–10.5)
Chloride: 108 mEq/L (ref 96–112)
Creatinine, Ser: 0.77 mg/dL (ref 0.50–1.10)
Glucose, Bld: 113 mg/dL — ABNORMAL HIGH (ref 70–99)
Potassium: 3.6 mEq/L (ref 3.5–5.3)
Sodium: 143 mEq/L (ref 135–145)
Total Bilirubin: 0.6 mg/dL (ref 0.3–1.2)
Total Protein: 6.5 g/dL (ref 6.0–8.3)

## 2012-03-12 LAB — CA 125: CA 125: 9.9 U/mL (ref 0.0–30.2)

## 2012-03-12 MED ORDER — HEPARIN SOD (PORK) LOCK FLUSH 100 UNIT/ML IV SOLN
500.0000 [IU] | Freq: Once | INTRAVENOUS | Status: AC
  Administered 2012-03-12: 500 [IU] via INTRAVENOUS
  Filled 2012-03-12: qty 5

## 2012-03-12 MED ORDER — SODIUM CHLORIDE 0.9 % IJ SOLN
10.0000 mL | INTRAMUSCULAR | Status: DC | PRN
  Administered 2012-03-12: 10 mL via INTRAVENOUS
  Filled 2012-03-12: qty 10

## 2012-03-14 ENCOUNTER — Telehealth: Payer: Self-pay | Admitting: Oncology

## 2012-03-14 NOTE — Telephone Encounter (Signed)
Moved 7/12 appt to 7/9 @ 1:30 pm due to call day. appt r/s'd per LL. S/w pt she is aware.

## 2012-03-26 ENCOUNTER — Ambulatory Visit (HOSPITAL_BASED_OUTPATIENT_CLINIC_OR_DEPARTMENT_OTHER): Payer: Medicare Other | Admitting: Oncology

## 2012-03-26 ENCOUNTER — Encounter: Payer: Self-pay | Admitting: Oncology

## 2012-03-26 ENCOUNTER — Telehealth: Payer: Self-pay | Admitting: Oncology

## 2012-03-26 VITALS — BP 164/81 | HR 73 | Temp 97.7°F | Ht 66.0 in | Wt 182.0 lb

## 2012-03-26 DIAGNOSIS — Z8543 Personal history of malignant neoplasm of ovary: Secondary | ICD-10-CM

## 2012-03-26 DIAGNOSIS — D696 Thrombocytopenia, unspecified: Secondary | ICD-10-CM | POA: Diagnosis not present

## 2012-03-26 DIAGNOSIS — H547 Unspecified visual loss: Secondary | ICD-10-CM

## 2012-03-26 DIAGNOSIS — C50919 Malignant neoplasm of unspecified site of unspecified female breast: Secondary | ICD-10-CM

## 2012-03-26 DIAGNOSIS — C569 Malignant neoplasm of unspecified ovary: Secondary | ICD-10-CM

## 2012-03-26 NOTE — Telephone Encounter (Signed)
gv pt appt schedule for July 2013 thru February 2014 including appt w/Dr. Stanford Breed for 04/12/12 and with Dr. Randon Goldsmith (ophthalmology) 7/11 @ 10 am. Pt also given mammo appt for 11/12/12 @ 10 am. mammo appt was already on schedule and appt for ophthalmology scheduled w/lois. Staff message sent to Tiffany to send notes to DR. Lyles @ [phone].

## 2012-03-26 NOTE — Progress Notes (Signed)
OFFICE PROGRESS NOTE Date of Visit 03-26-12 Physicians K.Renae Gloss, D.ClarkePearson  INTERVAL HISTORY:  Patient is seen, alone for visit, in scheduled 6 month follow up of her history of left breast carcinoma for which she continues tamoxifen, also history of IIIC ovarian carcinoma.  The breast cancer was diagnosed in March 2010, multifocal T1N0 at lumpectomy and sentinel node evaluation, ER/PR positive and HER 2 negative. She had local radiation, was intolerant to Femara and Aromasin due to severe arthralgias, and has been on tamoxifen since Aug 2011, planned for 5 years. Last bilateral mammograms were at [facility] 11-08-11 without mammographic findings of concern, and last breast MRI was 01-24-12 without findings of concern in breasts or nodal areas.  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 Surgery was TAH/BSO/omentectomy and radical complete debulking including anterior rectal resection with anastomosis. She is due back for yearly exam by Dr Yolande Jolly, which we will set up.  Patient still has PAC in, which she prefers to keep still. This is flushed at our office every 8 wks, last done 03-12-12.  Patient reports that she was hospitalized with pneumonia in 10-2011. She is not smoking now, which is still a struggle for her despite nicotine 14 patch, and she may do better with the 21 strength. She denies any illness since Feb. She is not aware of any changes on breast self exam and continues to tolerate tamoxifen without difficulty. She has had no symptoms of blood clots and no neurologic concerns. She has good appetite, bowels are moving normally, no abdominal or pelvic pain or distension. She denies any bleeding. She would like referral to ophthalmology, as she has only had glasses from optometry. She has had no problems with PAC. Remainder  of 10 point Review of Systems negative.  Patient's son had renal transplant in [place] also in 10-2011. Objective:  Vital signs in last 24 hours:  BP 164/81  Pulse 73  Temp 97.7 F (36.5 C) (Oral)  Ht 5\' 6"  (1.676 m)  Wt 182 lb (82.555 kg)  BMI 29.38 kg/m2 Easily ambulatory, occasional NP cough, NAD   HEENT:mucous membranes moist, pharynx normal without lesions. PERRL. No JVD LymphaticsCervical, supraclavicular, and axillary nodes normal.No inguinal adenopathy Resp: clear to percussion. Diminished breath sounds and few scattered minimal crackles bilaterally Cardio: regular rate and rhythm GI: soft, non-tender; bowel sounds normal; no masses,  no organomegaly Not distended Extremities: extremities normal, atraumatic, no cyanosis or edema Neuro:nonfocal Portacath-without erythema or tenderness Breasts: left with mild skin thickening and hyperpigmentation in radiation field, no drainage tho tiny tract still apparent at areola. No dominant mass, nothing in axilla, no skin changes of concern for recurrence. No swelling LUE. Right breast without dominant mass, skin or nipple findings of concern and right axilla/ UE not remarkable Skin without rash, petechiae, ecchymoses  Lab Results: From 03-12-12 CBC/ diff with WBC 8.8, ANC 6.8, Hgb 12.1 and plt 100.  Platelet count is slightly lower than her usual range  CMET also from 03-12-12 entirely normal other than glucose 113.  Studies/Results: Mammograms from [facility] 11-08-11 Breast MRI 01-24-12 noted CXR 10-29-11 Medications: I have reviewed the patient's current medications.  Assessment/Plan: 1. Multifocal left breast cancer: history as above, continuing tamoxifen planned for 5 years, thru Aug 2016.  2. IIIC ovarian carcinoma: history as above, now out ~ 7 years and clinically doing well 3.PAC in,  which needs flush every 8 wks 4.long tobacco: she has stopped smoking now, see above. Counseling done. 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 6.environmental allergies 7. Vision changes: referral to ophthalmology  Patient followed discussion well and is in agreement with plan.  Reece Packer, MD   03/26/2012, 2:38 PM

## 2012-03-26 NOTE — Patient Instructions (Signed)
Try 21 mg nicotine patch

## 2012-03-28 DIAGNOSIS — H25019 Cortical age-related cataract, unspecified eye: Secondary | ICD-10-CM | POA: Diagnosis not present

## 2012-03-28 DIAGNOSIS — H5200 Hypermetropia, unspecified eye: Secondary | ICD-10-CM | POA: Diagnosis not present

## 2012-03-28 DIAGNOSIS — H52209 Unspecified astigmatism, unspecified eye: Secondary | ICD-10-CM | POA: Diagnosis not present

## 2012-03-28 DIAGNOSIS — H2510 Age-related nuclear cataract, unspecified eye: Secondary | ICD-10-CM | POA: Diagnosis not present

## 2012-03-29 ENCOUNTER — Ambulatory Visit: Payer: Medicare Other | Admitting: Oncology

## 2012-04-12 ENCOUNTER — Ambulatory Visit: Payer: Medicare Other | Attending: Gynecology | Admitting: Gynecology

## 2012-04-12 ENCOUNTER — Encounter: Payer: Self-pay | Admitting: Gynecology

## 2012-04-12 VITALS — BP 118/62 | HR 70 | Temp 98.5°F | Resp 16 | Ht 66.0 in | Wt 180.3 lb

## 2012-04-12 DIAGNOSIS — C569 Malignant neoplasm of unspecified ovary: Secondary | ICD-10-CM

## 2012-04-12 DIAGNOSIS — Z79899 Other long term (current) drug therapy: Secondary | ICD-10-CM | POA: Insufficient documentation

## 2012-04-12 DIAGNOSIS — Z9221 Personal history of antineoplastic chemotherapy: Secondary | ICD-10-CM | POA: Diagnosis not present

## 2012-04-12 DIAGNOSIS — F17200 Nicotine dependence, unspecified, uncomplicated: Secondary | ICD-10-CM | POA: Insufficient documentation

## 2012-04-12 NOTE — Progress Notes (Signed)
Consult Note: Gyn-Onc   Monique Watson 71 y.o. female  Chief Complaint  Patient presents with  . Ovarian Cancer    Follow up    Interval History: The patient returns today as previously scheduled for followup. Since her last visit she's done well except as she's had a URI for the last 2 weeks. CA 125 on 03/12/2012 was 9.9 units per mL (stable) she specifically denies any GI or GU symptoms. She has no pelvic pain pressure vaginal bleeding or discharge.  HPI: Patient underwent debulking for a stage IIIC ovarian cancer in June of 2006. She was optimally debulked with complete resection of all visible tumor. She then received 6 cycles of carboplatin and Taxol chemotherapy. preoperatively her CA 125 was 2665 units per mL and dropped promptly during the chemotherapy. She's been followed since that time with no evidence recurrent disease.  Review of Systems:10 point review of systems is negative as noted above.   Vitals: Blood pressure 118/62, pulse 70, temperature 98.5 F (36.9 C), temperature source Oral, resp. rate 16, height 5\' 6"  (1.676 m), weight 180 lb 4.8 oz (81.784 kg).  Physical Exam: General : The patient is a healthy woman in no acute distress.  HEENT: normocephalic, extraoccular movements normal; neck is supple without thyromegally  Lynphnodes: Supraclavicular and inguinal nodes not enlarged  Abdomen: Soft, non-tender, no ascites, no organomegally, no masses, no hernias  Pelvic:  EGBUS: Normal female  Vagina: Normal, no lesions  Urethra and Bladder: Normal, non-tender  Cervix: Surgically absent  Uterus: Surgically absent  Bi-manual examination: Non-tender; no adenxal masses or nodularity  Rectal: normal sphincter tone, no masses, no blood  Lower extremities: No edema or varicosities. Normal range of motion    Assessment/Plan: Stage III C. ovarian cancer (completely resected) status post 6 cycles of carboplatin and Taxol chemotherapy. The patient is clinically free of  disease and has normal tumor markers. She will return to see Warner Mccreedy NP in followup in one year. We will obtain a CA 125  prior to that visit.  Allergies  Allergen Reactions  . Diovan (Valsartan) Other (See Comments)    arthralgias  . Uncoded Nonscreenable Allergen     Blood pressure medication patient stated that she has a low tolerance for    Past Medical History  Diagnosis Date  . Breast cancer 09/29/2011  . Ovarian cancer 09/29/2011  . Arthritis   . Pneumonia   . Hypertension 10/29/2011    No past surgical history on file.  Current Outpatient Prescriptions  Medication Sig Dispense Refill  . Calcium Carbonate-Vitamin D (CALCIUM 600 + D PO) Take 1 tablet by mouth 2 (two) times daily.      . Cholecalciferol (VITAMIN D3) 2000 UNITS TABS Take 1 capsule by mouth daily.      . cloNIDine (CATAPRES) 0.1 MG tablet TAKE ONE TABLET AT BEDTIME  90 tablet  0  . ferrous fumarate (HEMOCYTE - 106 MG FE) 325 (106 FE) MG TABS Take 1 tablet by mouth every 7 (seven) days. No certain day      . ibuprofen (ADVIL,MOTRIN) 800 MG tablet Take 800 mg by mouth every 8 (eight) hours as needed. For pain      . lidocaine-prilocaine (EMLA) cream Apply 1 application topically as needed. For port      . losartan (COZAAR) 50 MG tablet Take 50 mg by mouth daily.      . Omega-3 Fatty Acids (FISH OIL CONCENTRATE) 1000 MG CAPS Take 1 capsule by mouth daily.      Marland Kitchen  Potassium Chloride CR (MICRO-K) 8 MEQ CPCR Take 8 mEq by mouth daily. 99 mg (OTC preparation)      . tamoxifen (NOLVADEX) 20 MG tablet Take 1 tablet (20 mg total) by mouth daily.  30 tablet  5  . vitamin E (VITAMIN E) 400 UNIT capsule Take 400 Units by mouth 2 (two) times daily.        History   Social History  . Marital Status: Widowed    Spouse Name: N/A    Number of Children: N/A  . Years of Education: N/A   Occupational History  . Not on file.   Social History Main Topics  . Smoking status: Current Everyday Smoker -- 0.2 packs/day for 58  years  . Smokeless tobacco: Not on file   Comment: not smoking while using nicotine patch 03-26-12  . Alcohol Use: 0.6 oz/week    1 Shots of liquor per week  . Drug Use: No  . Sexually Active: Yes    Birth Control/ Protection: Post-menopausal   Other Topics Concern  . Not on file   Social History Narrative  . No narrative on file    No family history on file.    CLARKE-PEARSON,Sagan Maselli L, MD 04/12/2012, 2:00 PM

## 2012-04-12 NOTE — Patient Instructions (Addendum)
Return to see [place] in one year. We'll obtain a CA 125 just prior to that visit.

## 2012-04-16 DIAGNOSIS — H2510 Age-related nuclear cataract, unspecified eye: Secondary | ICD-10-CM | POA: Diagnosis not present

## 2012-04-16 DIAGNOSIS — IMO0002 Reserved for concepts with insufficient information to code with codable children: Secondary | ICD-10-CM | POA: Diagnosis not present

## 2012-04-16 DIAGNOSIS — H25019 Cortical age-related cataract, unspecified eye: Secondary | ICD-10-CM | POA: Diagnosis not present

## 2012-05-06 ENCOUNTER — Ambulatory Visit (HOSPITAL_BASED_OUTPATIENT_CLINIC_OR_DEPARTMENT_OTHER): Payer: Medicare Other

## 2012-05-06 VITALS — BP 175/85 | HR 78 | Temp 97.4°F | Resp 20

## 2012-05-06 DIAGNOSIS — C50419 Malignant neoplasm of upper-outer quadrant of unspecified female breast: Secondary | ICD-10-CM

## 2012-05-06 DIAGNOSIS — C50919 Malignant neoplasm of unspecified site of unspecified female breast: Secondary | ICD-10-CM

## 2012-05-06 DIAGNOSIS — Z452 Encounter for adjustment and management of vascular access device: Secondary | ICD-10-CM | POA: Diagnosis not present

## 2012-05-06 MED ORDER — SODIUM CHLORIDE 0.9 % IJ SOLN
10.0000 mL | INTRAMUSCULAR | Status: DC | PRN
  Administered 2012-05-06: 10 mL via INTRAVENOUS
  Filled 2012-05-06: qty 10

## 2012-05-06 MED ORDER — HEPARIN SOD (PORK) LOCK FLUSH 100 UNIT/ML IV SOLN
500.0000 [IU] | Freq: Once | INTRAVENOUS | Status: AC
  Administered 2012-05-06: 500 [IU] via INTRAVENOUS
  Filled 2012-05-06: qty 5

## 2012-05-14 DIAGNOSIS — Z800 Family history of malignant neoplasm of digestive organs: Secondary | ICD-10-CM | POA: Diagnosis not present

## 2012-05-14 DIAGNOSIS — K5900 Constipation, unspecified: Secondary | ICD-10-CM | POA: Diagnosis not present

## 2012-05-14 DIAGNOSIS — Z1211 Encounter for screening for malignant neoplasm of colon: Secondary | ICD-10-CM | POA: Diagnosis not present

## 2012-05-14 DIAGNOSIS — Z86010 Personal history of colonic polyps: Secondary | ICD-10-CM | POA: Diagnosis not present

## 2012-05-21 DIAGNOSIS — H2589 Other age-related cataract: Secondary | ICD-10-CM | POA: Diagnosis not present

## 2012-05-21 DIAGNOSIS — H2510 Age-related nuclear cataract, unspecified eye: Secondary | ICD-10-CM | POA: Diagnosis not present

## 2012-05-21 DIAGNOSIS — IMO0002 Reserved for concepts with insufficient information to code with codable children: Secondary | ICD-10-CM | POA: Diagnosis not present

## 2012-05-21 DIAGNOSIS — H25019 Cortical age-related cataract, unspecified eye: Secondary | ICD-10-CM | POA: Diagnosis not present

## 2012-07-01 ENCOUNTER — Ambulatory Visit (HOSPITAL_BASED_OUTPATIENT_CLINIC_OR_DEPARTMENT_OTHER): Payer: Medicare Other

## 2012-07-01 VITALS — BP 164/89 | Temp 98.2°F | Resp 18

## 2012-07-01 DIAGNOSIS — C50419 Malignant neoplasm of upper-outer quadrant of unspecified female breast: Secondary | ICD-10-CM | POA: Diagnosis not present

## 2012-07-01 DIAGNOSIS — Z95828 Presence of other vascular implants and grafts: Secondary | ICD-10-CM

## 2012-07-01 DIAGNOSIS — Z452 Encounter for adjustment and management of vascular access device: Secondary | ICD-10-CM

## 2012-07-01 MED ORDER — HEPARIN SOD (PORK) LOCK FLUSH 100 UNIT/ML IV SOLN
250.0000 [IU] | INTRAVENOUS | Status: DC | PRN
  Filled 2012-07-01: qty 5

## 2012-07-01 MED ORDER — SODIUM CHLORIDE 0.9 % IJ SOLN
10.0000 mL | INTRAMUSCULAR | Status: AC | PRN
  Administered 2012-07-01: 10 mL
  Filled 2012-07-01: qty 10

## 2012-07-01 MED ORDER — SODIUM CHLORIDE 0.9 % IJ SOLN
10.0000 mL | INTRAMUSCULAR | Status: DC | PRN
  Filled 2012-07-01: qty 10

## 2012-07-01 MED ORDER — HEPARIN SOD (PORK) LOCK FLUSH 100 UNIT/ML IV SOLN
500.0000 [IU] | INTRAVENOUS | Status: DC | PRN
  Filled 2012-07-01: qty 5

## 2012-07-01 MED ORDER — HEPARIN SOD (PORK) LOCK FLUSH 100 UNIT/ML IV SOLN
500.0000 [IU] | Freq: Once | INTRAVENOUS | Status: AC
  Administered 2012-07-01: 500 [IU] via INTRAVENOUS
  Filled 2012-07-01: qty 5

## 2012-07-05 ENCOUNTER — Other Ambulatory Visit: Payer: Self-pay | Admitting: *Deleted

## 2012-07-05 DIAGNOSIS — C569 Malignant neoplasm of unspecified ovary: Secondary | ICD-10-CM

## 2012-07-05 DIAGNOSIS — C50419 Malignant neoplasm of upper-outer quadrant of unspecified female breast: Secondary | ICD-10-CM

## 2012-07-05 MED ORDER — CLONIDINE HCL 0.1 MG PO TABS: ORAL_TABLET | ORAL | Status: DC

## 2012-07-17 DIAGNOSIS — K6389 Other specified diseases of intestine: Secondary | ICD-10-CM | POA: Diagnosis not present

## 2012-07-17 DIAGNOSIS — D126 Benign neoplasm of colon, unspecified: Secondary | ICD-10-CM | POA: Diagnosis not present

## 2012-07-17 DIAGNOSIS — Z1211 Encounter for screening for malignant neoplasm of colon: Secondary | ICD-10-CM | POA: Diagnosis not present

## 2012-07-17 DIAGNOSIS — Z800 Family history of malignant neoplasm of digestive organs: Secondary | ICD-10-CM | POA: Diagnosis not present

## 2012-10-21 ENCOUNTER — Other Ambulatory Visit: Payer: Medicare Other | Admitting: Lab

## 2012-10-23 ENCOUNTER — Telehealth: Payer: Self-pay | Admitting: Oncology

## 2012-10-24 ENCOUNTER — Ambulatory Visit: Payer: Medicare Other

## 2012-10-24 ENCOUNTER — Other Ambulatory Visit (HOSPITAL_BASED_OUTPATIENT_CLINIC_OR_DEPARTMENT_OTHER): Payer: Medicare Other | Admitting: Lab

## 2012-10-24 VITALS — BP 144/83 | HR 82 | Temp 97.5°F

## 2012-10-24 DIAGNOSIS — C50919 Malignant neoplasm of unspecified site of unspecified female breast: Secondary | ICD-10-CM

## 2012-10-24 DIAGNOSIS — C50419 Malignant neoplasm of upper-outer quadrant of unspecified female breast: Secondary | ICD-10-CM

## 2012-10-24 LAB — COMPREHENSIVE METABOLIC PANEL (CC13)
ALT: 12 U/L (ref 0–55)
AST: 15 U/L (ref 5–34)
Albumin: 3 g/dL — ABNORMAL LOW (ref 3.5–5.0)
Alkaline Phosphatase: 58 U/L (ref 40–150)
BUN: 11.2 mg/dL (ref 7.0–26.0)
CO2: 24 mEq/L (ref 22–29)
Calcium: 8.8 mg/dL (ref 8.4–10.4)
Chloride: 112 mEq/L — ABNORMAL HIGH (ref 98–107)
Creatinine: 0.8 mg/dL (ref 0.6–1.1)
Glucose: 103 mg/dl — ABNORMAL HIGH (ref 70–99)
Potassium: 3.7 mEq/L (ref 3.5–5.1)
Sodium: 144 mEq/L (ref 136–145)
Total Bilirubin: 0.54 mg/dL (ref 0.20–1.20)
Total Protein: 7.3 g/dL (ref 6.4–8.3)

## 2012-10-24 LAB — CBC WITH DIFFERENTIAL/PLATELET
BASO%: 0.8 % (ref 0.0–2.0)
Basophils Absolute: 0 10*3/uL (ref 0.0–0.1)
EOS%: 6.7 % (ref 0.0–7.0)
Eosinophils Absolute: 0.4 10*3/uL (ref 0.0–0.5)
HCT: 36.9 % (ref 34.8–46.6)
HGB: 12.5 g/dL (ref 11.6–15.9)
LYMPH%: 27.2 % (ref 14.0–49.7)
MCH: 31.8 pg (ref 25.1–34.0)
MCHC: 33.7 g/dL (ref 31.5–36.0)
MCV: 94.1 fL (ref 79.5–101.0)
MONO#: 0.5 10*3/uL (ref 0.1–0.9)
MONO%: 8.1 % (ref 0.0–14.0)
NEUT#: 3.6 10*3/uL (ref 1.5–6.5)
NEUT%: 57.2 % (ref 38.4–76.8)
Platelets: 135 10*3/uL — ABNORMAL LOW (ref 145–400)
RBC: 3.92 10*6/uL (ref 3.70–5.45)
RDW: 13.6 % (ref 11.2–14.5)
WBC: 6.4 10*3/uL (ref 3.9–10.3)
lymph#: 1.7 10*3/uL (ref 0.9–3.3)

## 2012-10-24 MED ORDER — HEPARIN SOD (PORK) LOCK FLUSH 100 UNIT/ML IV SOLN
500.0000 [IU] | Freq: Once | INTRAVENOUS | Status: AC
  Administered 2012-10-24: 500 [IU] via INTRAVENOUS
  Filled 2012-10-24: qty 5

## 2012-10-24 MED ORDER — SODIUM CHLORIDE 0.9 % IJ SOLN
10.0000 mL | INTRAMUSCULAR | Status: DC | PRN
  Administered 2012-10-24: 10 mL via INTRAVENOUS
  Filled 2012-10-24: qty 10

## 2012-11-10 ENCOUNTER — Other Ambulatory Visit: Payer: Self-pay | Admitting: Oncology

## 2012-11-12 DIAGNOSIS — Z853 Personal history of malignant neoplasm of breast: Secondary | ICD-10-CM | POA: Diagnosis not present

## 2012-11-15 ENCOUNTER — Telehealth: Payer: Self-pay | Admitting: Oncology

## 2012-11-15 ENCOUNTER — Ambulatory Visit (HOSPITAL_BASED_OUTPATIENT_CLINIC_OR_DEPARTMENT_OTHER): Payer: Medicare Other | Admitting: Oncology

## 2012-11-15 ENCOUNTER — Encounter: Payer: Self-pay | Admitting: Oncology

## 2012-11-15 VITALS — BP 177/89 | HR 97 | Temp 99.5°F | Resp 20 | Ht 66.0 in | Wt 182.0 lb

## 2012-11-15 DIAGNOSIS — C50419 Malignant neoplasm of upper-outer quadrant of unspecified female breast: Secondary | ICD-10-CM

## 2012-11-15 DIAGNOSIS — D696 Thrombocytopenia, unspecified: Secondary | ICD-10-CM | POA: Diagnosis not present

## 2012-11-15 DIAGNOSIS — C50912 Malignant neoplasm of unspecified site of left female breast: Secondary | ICD-10-CM

## 2012-11-15 DIAGNOSIS — Z8543 Personal history of malignant neoplasm of ovary: Secondary | ICD-10-CM | POA: Diagnosis not present

## 2012-11-15 NOTE — Progress Notes (Signed)
OFFICE PROGRESS NOTE   11/15/2012   Physicians:K.Renae Gloss, D.ClarkePearson   INTERVAL HISTORY:   Patient is seen, alone for visit, in scheduled follow up of her history of left breast cancer, on tamoxifen since Aug 2011; she also has a remote history of IIIC ovarian cancer (laterality not documented in this or previous EMR). Patient still has PAC in, placed by Dr Lurene Shadow in 2006, which she prefers to keep still as she has very poor peripheral IV access. This is flushed at our office every 8 wks, last done 10-24-12.  The breast cancer was diagnosed in March 2010, multifocal T1N0 at lumpectomy and sentinel node evaluation, ER/PR positive and HER 2 negative. She had local radiation, was intolerant to Femara and Aromasin due to severe arthralgias, and has been on tamoxifen since Aug 2011, planned for 5 years. Last bilateral mammograms were at [facility] 11-12-12 with copy of that report still pending, and last breast MRI was 01-24-12 without findings of concern in breasts or nodal areas.  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 Surgery was TAH/BSO/omentectomy and radical complete debulking including anterior rectal resection with anastomosis. She had 6 cycles of adjuvant taxol/ carboplatin from 04-18-2005 thru 08-14-2005. She saw Dr Yolande Jolly last in July 2013, with plan to see gyn oncology NP in one year.  Patient tells me that she has been feeling very well recently. She has had no infectious illness including bronchitis thru winter, did not take flu vaccine. She is still smoking "when friends come over and we drink" and we have discussed, again. She denies increased SOB or cough. She denies any new or different pain. She feels better when bowels move daily. She is not aware of any changes in breasts. She has had no problems with PAC and  understands that it can be taken out if she would like. She has had no bleeding. She denies swelling LE. Appetite and energy are good. Remainder of 10 point Review of Systems negative.  Objective:  Vital signs in last 24 hours:  BP 177/89  Pulse 97  Temp(Src) 99.5 F (37.5 C) (Oral)  Resp 20  Ht 5\' 6"  (1.676 m)  Wt 182 lb (82.555 kg)  BMI 29.39 kg/m2 Weight is stable. Alert, easily mobile, looks comfortable.   HEENT:PERRLA, sclera clear, anicteric and oropharynx clear, no lesions Voice a little hoarse as is her baseline. LymphaticsCervical, supraclavicular, and axillary nodes normal. no inguinal adenopathy. Resp: clear to percussion, diminished BS bilaterally, few expiratory wheezes R base posteriorly other wise clear Cardio: regular rate and rhythm GI: soft, non-tender; bowel sounds normal; no masses,  no organomegaly. Surgical incisions well healed. Extremities: extremities normal, atraumatic, no cyanosis or edema Neuro:no focal deficits Breast: Left with well healed lumpectomy scar, no dominant mass or nipple findings, hyperpigmentation in RT field, nothing in left axilla and no swelling LUE. Right breast with minimal irregularities, no dominant mass, no skin or nipple findings of concern and axilla bengin Portacath-without erythema or tenderness Skin without rash or ecchymosis  Lab Results:  Results for orders placed in visit on 10/24/12  CBC WITH DIFFERENTIAL      Result Value Range   WBC 6.4  3.9 - 10.3 10e3/uL   NEUT# 3.6  1.5 - 6.5 10e3/uL   HGB 12.5  11.6 - 15.9 g/dL   HCT 30.8  65.7 - 84.6 %  Platelets 135 (*) 145 - 400 10e3/uL   MCV 94.1  79.5 - 101.0 fL   MCH 31.8  25.1 - 34.0 pg   MCHC 33.7  31.5 - 36.0 g/dL   RBC 1.61  0.96 - 0.45 10e6/uL   RDW 13.6  11.2 - 14.5 %   lymph# 1.7  0.9 - 3.3 10e3/uL   MONO# 0.5  0.1 - 0.9 10e3/uL   Eosinophils Absolute 0.4  0.0 - 0.5 10e3/uL   Basophils Absolute 0.0  0.0 - 0.1 10e3/uL   NEUT% 57.2  38.4 - 76.8 %   LYMPH%  27.2  14.0 - 49.7 %   MONO% 8.1  0.0 - 14.0 %   EOS% 6.7  0.0 - 7.0 %   BASO% 0.8  0.0 - 2.0 %  COMPREHENSIVE METABOLIC PANEL (CC13)      Result Value Range   Sodium 144  136 - 145 mEq/L   Potassium 3.7  3.5 - 5.1 mEq/L   Chloride 112 (*) 98 - 107 mEq/L   CO2 24  22 - 29 mEq/L   Glucose 103 (*) 70 - 99 mg/dl   BUN 40.9  7.0 - 81.1 mg/dL   Creatinine 0.8  0.6 - 1.1 mg/dL   Total Bilirubin 9.14  0.20 - 1.20 mg/dL   Alkaline Phosphatase 58  40 - 150 U/L   AST 15  5 - 34 U/L   ALT 12  0 - 55 U/L   Total Protein 7.3  6.4 - 8.3 g/dL   Albumin 3.0 (*) 3.5 - 5.0 g/dL   Calcium 8.8  8.4 - 78.2 mg/dL   Platelets above improved, mild chronic thrombocytopenia.  Studies/Results:  Mammograms [facility] earlier this week: report pending  Medications: I have reviewed the patient's current medications. She will continue present tamoxifen.  Assessment/Plan:  1. Multifocal left breast cancer: history as above, continuing tamoxifen planned for 5 years, thru Aug 2016. I will see her back in 6 months, or sooner if needed. Mammograms just done at [facility] 2. IIIC ovarian carcinoma: history as above, now out ~ 7.5 years and clinically doing well. Message sent to gyn oncology re scheduling to NP in Aug. 3.PAC in, which needs flush every 8 wks  4.long tobacco: she stopped smoking after last visit, but has resumed intermittently. Counseling done.  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 CT Abd 2010 had normal spleen; note frequent Etoh, which could be suppressing bone marrow also. Will follow.  6.environmental allergies 7.patient declined flu vaccine   LIVESAY,LENNIS P, MD   11/15/2012, 11:10 AM

## 2012-11-15 NOTE — Telephone Encounter (Signed)
Gave pt appt for lab on May 2014 and lab and MD on August 2014

## 2012-11-15 NOTE — Patient Instructions (Signed)
Portacath flush every 8 weeks  Stop smoking!

## 2013-01-22 ENCOUNTER — Other Ambulatory Visit: Payer: Self-pay

## 2013-01-22 DIAGNOSIS — C50919 Malignant neoplasm of unspecified site of unspecified female breast: Secondary | ICD-10-CM

## 2013-01-22 MED ORDER — TAMOXIFEN CITRATE 20 MG PO TABS: 20.0000 mg | ORAL_TABLET | Freq: Every day | ORAL | Status: DC

## 2013-01-22 NOTE — Telephone Encounter (Signed)
Told Monique Watson that the Tamoxifen was called into [facility] at [facility].  She is to continue the Tamoxifen for 5 years, which will be through August of 2016.  Pt. Verbalized understanding.

## 2013-02-05 DIAGNOSIS — H20029 Recurrent acute iridocyclitis, unspecified eye: Secondary | ICD-10-CM | POA: Diagnosis not present

## 2013-02-13 ENCOUNTER — Ambulatory Visit (HOSPITAL_BASED_OUTPATIENT_CLINIC_OR_DEPARTMENT_OTHER): Payer: Medicare Other

## 2013-02-13 VITALS — BP 157/80 | HR 75 | Temp 97.4°F | Resp 18

## 2013-02-13 DIAGNOSIS — C50919 Malignant neoplasm of unspecified site of unspecified female breast: Secondary | ICD-10-CM

## 2013-02-13 DIAGNOSIS — C50912 Malignant neoplasm of unspecified site of left female breast: Secondary | ICD-10-CM

## 2013-02-13 MED ORDER — HEPARIN SOD (PORK) LOCK FLUSH 100 UNIT/ML IV SOLN
500.0000 [IU] | Freq: Once | INTRAVENOUS | Status: AC
Start: 2013-02-13 — End: 2013-02-13
  Administered 2013-02-13: 500 [IU] via INTRAVENOUS
  Filled 2013-02-13: qty 5

## 2013-02-13 MED ORDER — SODIUM CHLORIDE 0.9 % IJ SOLN
10.0000 mL | INTRAMUSCULAR | Status: DC | PRN
  Administered 2013-02-13: 10 mL via INTRAVENOUS
  Filled 2013-02-13: qty 10

## 2013-02-13 NOTE — Patient Instructions (Signed)
Call MD for problems or concerns 

## 2013-03-18 DIAGNOSIS — R7309 Other abnormal glucose: Secondary | ICD-10-CM | POA: Diagnosis not present

## 2013-03-18 DIAGNOSIS — F17200 Nicotine dependence, unspecified, uncomplicated: Secondary | ICD-10-CM | POA: Diagnosis not present

## 2013-03-18 DIAGNOSIS — C50919 Malignant neoplasm of unspecified site of unspecified female breast: Secondary | ICD-10-CM | POA: Diagnosis not present

## 2013-03-18 DIAGNOSIS — Z0000 Encounter for general adult medical examination without abnormal findings: Secondary | ICD-10-CM | POA: Diagnosis not present

## 2013-03-18 DIAGNOSIS — E559 Vitamin D deficiency, unspecified: Secondary | ICD-10-CM | POA: Diagnosis not present

## 2013-03-18 DIAGNOSIS — I10 Essential (primary) hypertension: Secondary | ICD-10-CM | POA: Diagnosis not present

## 2013-03-26 ENCOUNTER — Other Ambulatory Visit: Payer: Self-pay | Admitting: *Deleted

## 2013-03-26 DIAGNOSIS — C50919 Malignant neoplasm of unspecified site of unspecified female breast: Secondary | ICD-10-CM

## 2013-03-26 DIAGNOSIS — C569 Malignant neoplasm of unspecified ovary: Secondary | ICD-10-CM

## 2013-03-26 DIAGNOSIS — C50412 Malignant neoplasm of upper-outer quadrant of left female breast: Secondary | ICD-10-CM

## 2013-03-26 MED ORDER — CLONIDINE HCL 0.1 MG PO TABS: ORAL_TABLET | ORAL | Status: DC

## 2013-03-26 MED ORDER — TAMOXIFEN CITRATE 20 MG PO TABS: 20.0000 mg | ORAL_TABLET | Freq: Every day | ORAL | Status: DC

## 2013-04-08 ENCOUNTER — Telehealth: Payer: Self-pay | Admitting: Oncology

## 2013-04-08 NOTE — Telephone Encounter (Signed)
pt needed to r/s flush....Done

## 2013-04-10 ENCOUNTER — Ambulatory Visit (HOSPITAL_BASED_OUTPATIENT_CLINIC_OR_DEPARTMENT_OTHER): Payer: Medicare Other

## 2013-04-10 VITALS — BP 176/88 | HR 84 | Temp 99.2°F | Resp 18

## 2013-04-10 DIAGNOSIS — C569 Malignant neoplasm of unspecified ovary: Secondary | ICD-10-CM | POA: Diagnosis not present

## 2013-04-10 DIAGNOSIS — Z452 Encounter for adjustment and management of vascular access device: Secondary | ICD-10-CM

## 2013-04-10 MED ORDER — HEPARIN SOD (PORK) LOCK FLUSH 100 UNIT/ML IV SOLN
500.0000 [IU] | Freq: Once | INTRAVENOUS | Status: AC
  Administered 2013-04-10: 500 [IU] via INTRAVENOUS
  Filled 2013-04-10: qty 5

## 2013-04-10 MED ORDER — SODIUM CHLORIDE 0.9 % IJ SOLN
10.0000 mL | INTRAMUSCULAR | Status: DC | PRN
  Administered 2013-04-10: 10 mL via INTRAVENOUS
  Filled 2013-04-10: qty 10

## 2013-04-10 NOTE — Patient Instructions (Signed)
Call MD for problems 

## 2013-04-29 ENCOUNTER — Ambulatory Visit: Payer: Medicare Other | Admitting: Oncology

## 2013-05-06 ENCOUNTER — Telehealth: Payer: Self-pay | Admitting: Oncology

## 2013-05-06 NOTE — Telephone Encounter (Signed)
, °

## 2013-05-13 ENCOUNTER — Ambulatory Visit: Payer: Medicare Other

## 2013-05-13 ENCOUNTER — Other Ambulatory Visit: Payer: Medicare Other | Admitting: Lab

## 2013-05-13 ENCOUNTER — Other Ambulatory Visit: Payer: Self-pay | Admitting: Oncology

## 2013-05-14 ENCOUNTER — Ambulatory Visit: Payer: Medicare Other | Attending: Oncology | Admitting: Oncology

## 2013-05-14 ENCOUNTER — Ambulatory Visit (HOSPITAL_BASED_OUTPATIENT_CLINIC_OR_DEPARTMENT_OTHER): Payer: Medicare Other

## 2013-05-14 ENCOUNTER — Telehealth (HOSPITAL_COMMUNITY): Payer: Self-pay | Admitting: *Deleted

## 2013-05-14 ENCOUNTER — Other Ambulatory Visit (HOSPITAL_BASED_OUTPATIENT_CLINIC_OR_DEPARTMENT_OTHER): Payer: Medicare Other | Admitting: Lab

## 2013-05-14 VITALS — BP 180/77 | HR 71 | Temp 98.2°F

## 2013-05-14 VITALS — BP 188/82 | HR 74 | Temp 98.6°F | Resp 20 | Ht 66.0 in | Wt 178.1 lb

## 2013-05-14 DIAGNOSIS — C50912 Malignant neoplasm of unspecified site of left female breast: Secondary | ICD-10-CM

## 2013-05-14 DIAGNOSIS — F17200 Nicotine dependence, unspecified, uncomplicated: Secondary | ICD-10-CM | POA: Diagnosis not present

## 2013-05-14 DIAGNOSIS — Z452 Encounter for adjustment and management of vascular access device: Secondary | ICD-10-CM

## 2013-05-14 DIAGNOSIS — D696 Thrombocytopenia, unspecified: Secondary | ICD-10-CM | POA: Diagnosis not present

## 2013-05-14 DIAGNOSIS — C50919 Malignant neoplasm of unspecified site of unspecified female breast: Secondary | ICD-10-CM | POA: Diagnosis not present

## 2013-05-14 DIAGNOSIS — Z8543 Personal history of malignant neoplasm of ovary: Secondary | ICD-10-CM | POA: Diagnosis not present

## 2013-05-14 LAB — CBC WITH DIFFERENTIAL/PLATELET
BASO%: 0.2 % (ref 0.0–2.0)
Basophils Absolute: 0 10*3/uL (ref 0.0–0.1)
EOS%: 2.1 % (ref 0.0–7.0)
Eosinophils Absolute: 0.1 10*3/uL (ref 0.0–0.5)
HCT: 35.5 % (ref 34.8–46.6)
HGB: 12.3 g/dL (ref 11.6–15.9)
LYMPH%: 21.3 % (ref 14.0–49.7)
MCH: 33.1 pg (ref 25.1–34.0)
MCHC: 34.6 g/dL (ref 31.5–36.0)
MCV: 95.4 fL (ref 79.5–101.0)
MONO#: 0.5 10*3/uL (ref 0.1–0.9)
MONO%: 7.3 % (ref 0.0–14.0)
NEUT#: 4.3 10*3/uL (ref 1.5–6.5)
NEUT%: 69.1 % (ref 38.4–76.8)
Platelets: 132 10*3/uL — ABNORMAL LOW (ref 145–400)
RBC: 3.72 10*6/uL (ref 3.70–5.45)
RDW: 13.5 % (ref 11.2–14.5)
WBC: 6.2 10*3/uL (ref 3.9–10.3)
lymph#: 1.3 10*3/uL (ref 0.9–3.3)
nRBC: 0 % (ref 0–0)

## 2013-05-14 LAB — COMPREHENSIVE METABOLIC PANEL (CC13)
ALT: 12 U/L (ref 0–55)
AST: 15 U/L (ref 5–34)
Albumin: 3 g/dL — ABNORMAL LOW (ref 3.5–5.0)
Alkaline Phosphatase: 46 U/L (ref 40–150)
BUN: 10.7 mg/dL (ref 7.0–26.0)
CO2: 24 mEq/L (ref 22–29)
Calcium: 8.4 mg/dL (ref 8.4–10.4)
Chloride: 109 mEq/L (ref 98–109)
Creatinine: 0.7 mg/dL (ref 0.6–1.1)
Glucose: 95 mg/dl (ref 70–140)
Potassium: 3.4 mEq/L — ABNORMAL LOW (ref 3.5–5.1)
Sodium: 140 mEq/L (ref 136–145)
Total Bilirubin: 0.51 mg/dL (ref 0.20–1.20)
Total Protein: 7.2 g/dL (ref 6.4–8.3)

## 2013-05-14 MED ORDER — SODIUM CHLORIDE 0.9 % IJ SOLN
10.0000 mL | INTRAMUSCULAR | Status: DC | PRN
  Administered 2013-05-14: 10 mL via INTRAVENOUS
  Filled 2013-05-14: qty 10

## 2013-05-14 MED ORDER — HEPARIN SOD (PORK) LOCK FLUSH 100 UNIT/ML IV SOLN
500.0000 [IU] | Freq: Once | INTRAVENOUS | Status: AC
  Administered 2013-05-14: 500 [IU] via INTRAVENOUS
  Filled 2013-05-14: qty 5

## 2013-05-14 NOTE — Patient Instructions (Signed)
Please consider stopping smoking again, with your husband!  Dr Darrold Span will ask [facility] staff to let you know about stop smoking programs and aids

## 2013-05-14 NOTE — Progress Notes (Signed)
OFFICE PROGRESS NOTE   05/14/2013   Physicians:K.Renae Gloss, D.ClarkePearson   INTERVAL HISTORY:  Patient is seen, together with husband, in continuing attention to her history of left breast cancer for which she continues tamoxifen (intolerant to aromatase inhibitors); she also has remote history of IIIC ovarian cancer. Last mammograms were at [facility] 11-12-12 with scattered densities and no mammographic findings of concern (not tomo); last breast MRI was 01-24-2012 with no findings of concern. She has PAC still in due to extremely difficuly peripheral IV access, flushed today.   ONCOLOGIC HISTORY The breast cancer was diagnosed in March 2010, multifocal T1N0 at lumpectomy and sentinel node evaluation, ER/PR positive and HER 2 negative. She had local radiation, was intolerant to Femara and Aromasin due to severe arthralgias, and has been on tamoxifen since Aug 2011, planned for 5 years. Last bilateral mammograms were at [facility] 11-12-12 and last breast MRI was 01-24-12 without findings of concern in breasts or nodal areas.  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 Surgery was TAH/BSO/omentectomy and radical complete debulking including anterior rectal resection with anastomosis. She had 6 cycles of adjuvant taxol/ carboplatin from 04-18-2005 thru 08-14-2005. She saw Dr Yolande Jolly last in July 2013 and is for yearly follow up at that office.   Review of systems as above, also: No infectious illness including sinusitis or lower respiratory infection thru summer. Good energy, good appetite. Denies increased SOB, chest pain, increased cough. No pain. Bowels moving regularly. No changes on breast self exam. Remainder of 10 point Review of Systems negative.  Objective:  Vital signs in last 24 hours:  BP 188/82  Pulse 74  Temp(Src) 98.6 F  (37 C) (Oral)  Resp 20  Ht 5\' 6"  (1.676 m)  Wt 178 lb 1.6 oz (80.786 kg)  BMI 28.76 kg/m2  Alert, oriented and appropriate. Ambulatory without difficulty. Looks comfortable. Husband very supportive.   HEENT:PERRL, sclerae not icteric. Oral mucosa moist without lesions, posterior pharynx clear. Neck supple.  Lymphatics no cervical,suraclavicular, axillary or inguinal adenopathy Resp: diminished breath sounds thruout and hyperresonant to percussion, otherwise clear to auscultation\ Back nontender to palpation. Cardio: regular rate and rhythm. No gallop. GI: soft, nontender, not distended, no mass or organomegaly. Surgical incision not remarkable. Extremities: without pitting edema, cords, tenderness Neuro: no peripheral neuropathy. Otherwise nonfocal Skin without rash, ecchymosis, petechiae Breasts: without dominant mass, skin or nipple findings on right. Left breast with hyperpigmentation in RT field, well healed lumpectomy scar, no dominant mass or other findings of concern. Axillae benign. Portacath-without erythema or tenderness  Lab Results:  Results for orders placed in visit on 05/14/13  CBC WITH DIFFERENTIAL      Result Value Range   WBC 6.2  3.9 - 10.3 10e3/uL   NEUT# 4.3  1.5 - 6.5 10e3/uL   HGB 12.3  11.6 - 15.9 g/dL   HCT 03.4  74.2 - 59.5 %   Platelets 132 (*) 145 - 400 10e3/uL   MCV 95.4  79.5 - 101.0 fL   MCH 33.1  25.1 - 34.0 pg   MCHC 34.6  31.5 - 36.0 g/dL   RBC 6.38  7.56 - 4.33 10e6/uL   RDW 13.5  11.2 - 14.5 %   lymph# 1.3  0.9 - 3.3 10e3/uL   MONO# 0.5  0.1 - 0.9 10e3/uL   Eosinophils Absolute 0.1  0.0 - 0.5  10e3/uL   Basophils Absolute 0.0  0.0 - 0.1 10e3/uL   NEUT% 69.1  38.4 - 76.8 %   LYMPH% 21.3  14.0 - 49.7 %   MONO% 7.3  0.0 - 14.0 %   EOS% 2.1  0.0 - 7.0 %   BASO% 0.2  0.0 - 2.0 %   nRBC 0  0 - 0 %  COMPREHENSIVE METABOLIC PANEL (CC13)      Result Value Range   Sodium 140  136 - 145 mEq/L   Potassium 3.4 (*) 3.5 - 5.1 mEq/L   Chloride 109   98 - 109 mEq/L   CO2 24  22 - 29 mEq/L   Glucose 95  70 - 140 mg/dl   BUN 16.1  7.0 - 09.6 mg/dL   Creatinine 0.7  0.6 - 1.1 mg/dL   Total Bilirubin 0.45  0.20 - 1.20 mg/dL   Alkaline Phosphatase 46  40 - 150 U/L   AST 15  5 - 34 U/L   ALT 12  0 - 55 U/L   Total Protein 7.2  6.4 - 8.3 g/dL   Albumin 3.0 (*) 3.5 - 5.0 g/dL   Calcium 8.4  8.4 - 40.9 mg/dL   Platelets above stable  Studies/Results:  No results found. Last CXR in this EMR 10-2011  Medications: I have reviewed the patient's current medications. She had shingles vaccine since she was here last.  We have discussed smoking cessation strategies for patient and husband, and I have asked support staff at [facility] to be in touch with them re available programs to help with this.  We have discussed keeping PAC in vs removing it. As her peripheral IV access is extremely difficult, she prefers to keep the [facility] and continue to have it flushed regularly for now.  Assessment/Plan: 1. Multifocal left breast cancer: history as above, continuing tamoxifen planned for 5 years, thru Aug 2016. I will see her back in 6 months, or sooner if needed. Mammograms at [facility]  2. IIIC ovarian carcinoma: history as above, now out ~ 8 years and clinically doing well. She is due back to gyn oncology which we will schedule.  3.PAC in, which needs flush every 8 wks. This was placed by Dr Lurene Shadow in 2006. Extremely difficult peripheral IV access including for blood draws. 4.long tobacco, which she resumed after stopping "cold [place]" when son had renal transplant [place] of [place]. Counseling done for patient and husband and [facility] lung navigator to follow up with additional resources. 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 CT Abd 2010 had normal spleen; note hx Etoh, which could be suppressing bone marrow also. Will follow.  6.environmental allergies 7.shingles vaccine done ~ July 2014. Flu vaccine would be  medically appropriate, however she has refused these in past.  I will see her back in 6 mo coordinating with PAC flush TIme spent including >50% discussion 25 min  Gidget Quizhpi P, MD   05/14/2013, 10:35 AM

## 2013-05-14 NOTE — Telephone Encounter (Signed)
Attempted to call patient to give her information about upcoming smoking cessation classes and information about the [employer]. No one answered phone. Left voicemail for patient to call me back.

## 2013-05-15 ENCOUNTER — Encounter: Payer: Self-pay | Admitting: Oncology

## 2013-05-16 ENCOUNTER — Telehealth: Payer: Self-pay | Admitting: *Deleted

## 2013-05-16 NOTE — Telephone Encounter (Signed)
Called left VM message to call Annabelle Harman at [phone].  I will send smoking cessation information in the mail.

## 2013-05-20 ENCOUNTER — Encounter (HOSPITAL_COMMUNITY): Payer: Self-pay | Admitting: Emergency Medicine

## 2013-05-20 ENCOUNTER — Emergency Department (HOSPITAL_COMMUNITY)
Admission: EM | Admit: 2013-05-20 | Discharge: 2013-05-20 | Disposition: A | Discharge status: Home / Self Care | Payer: Medicare Other | Attending: Emergency Medicine | Admitting: Emergency Medicine

## 2013-05-20 ENCOUNTER — Emergency Department (HOSPITAL_COMMUNITY): Payer: Medicare Other

## 2013-05-20 DIAGNOSIS — Z79899 Other long term (current) drug therapy: Secondary | ICD-10-CM | POA: Insufficient documentation

## 2013-05-20 DIAGNOSIS — I10 Essential (primary) hypertension: Secondary | ICD-10-CM | POA: Insufficient documentation

## 2013-05-20 DIAGNOSIS — Z8701 Personal history of pneumonia (recurrent): Secondary | ICD-10-CM | POA: Insufficient documentation

## 2013-05-20 DIAGNOSIS — F17200 Nicotine dependence, unspecified, uncomplicated: Secondary | ICD-10-CM | POA: Diagnosis not present

## 2013-05-20 DIAGNOSIS — Z853 Personal history of malignant neoplasm of breast: Secondary | ICD-10-CM | POA: Diagnosis not present

## 2013-05-20 DIAGNOSIS — R109 Unspecified abdominal pain: Secondary | ICD-10-CM | POA: Insufficient documentation

## 2013-05-20 DIAGNOSIS — K5900 Constipation, unspecified: Secondary | ICD-10-CM | POA: Diagnosis not present

## 2013-05-20 DIAGNOSIS — M129 Arthropathy, unspecified: Secondary | ICD-10-CM | POA: Diagnosis not present

## 2013-05-20 DIAGNOSIS — R110 Nausea: Secondary | ICD-10-CM | POA: Diagnosis not present

## 2013-05-20 DIAGNOSIS — Z8543 Personal history of malignant neoplasm of ovary: Secondary | ICD-10-CM | POA: Insufficient documentation

## 2013-05-20 LAB — URINALYSIS, ROUTINE W REFLEX MICROSCOPIC
Bilirubin Urine: NEGATIVE
Glucose, UA: NEGATIVE mg/dL
Hgb urine dipstick: NEGATIVE
Ketones, ur: NEGATIVE mg/dL
Nitrite: NEGATIVE
Protein, ur: NEGATIVE mg/dL
Specific Gravity, Urine: 1.019 (ref 1.005–1.030)
Urobilinogen, UA: 0.2 mg/dL (ref 0.0–1.0)
pH: 7 (ref 5.0–8.0)

## 2013-05-20 LAB — POCT I-STAT, CHEM 8
BUN: 12 mg/dL (ref 6–23)
Calcium, Ion: 1.15 mmol/L (ref 1.13–1.30)
Chloride: 108 mEq/L (ref 96–112)
Creatinine, Ser: 1 mg/dL (ref 0.50–1.10)
Glucose, Bld: 105 mg/dL — ABNORMAL HIGH (ref 70–99)
HCT: 39 % (ref 36.0–46.0)
Hemoglobin: 13.3 g/dL (ref 12.0–15.0)
Potassium: 3.1 mEq/L — ABNORMAL LOW (ref 3.5–5.1)
Sodium: 143 mEq/L (ref 135–145)
TCO2: 23 mmol/L (ref 0–100)

## 2013-05-20 LAB — CBC WITH DIFFERENTIAL/PLATELET
Basophils Absolute: 0 10*3/uL (ref 0.0–0.1)
Basophils Relative: 0 % (ref 0–1)
Eosinophils Absolute: 0.2 10*3/uL (ref 0.0–0.7)
Eosinophils Relative: 4 % (ref 0–5)
HCT: 36.1 % (ref 36.0–46.0)
Hemoglobin: 13 g/dL (ref 12.0–15.0)
Lymphocytes Relative: 31 % (ref 12–46)
Lymphs Abs: 2 10*3/uL (ref 0.7–4.0)
MCH: 33.8 pg (ref 26.0–34.0)
MCHC: 36 g/dL (ref 30.0–36.0)
MCV: 93.8 fL (ref 78.0–100.0)
Monocytes Absolute: 0.4 10*3/uL (ref 0.1–1.0)
Monocytes Relative: 6 % (ref 3–12)
Neutro Abs: 3.7 10*3/uL (ref 1.7–7.7)
Neutrophils Relative %: 58 % (ref 43–77)
Platelets: 138 10*3/uL — ABNORMAL LOW (ref 150–400)
RBC: 3.85 MIL/uL — ABNORMAL LOW (ref 3.87–5.11)
RDW: 13.2 % (ref 11.5–15.5)
WBC: 6.4 10*3/uL (ref 4.0–10.5)

## 2013-05-20 LAB — URINE MICROSCOPIC-ADD ON

## 2013-05-20 MED ORDER — POTASSIUM CHLORIDE CRYS ER 20 MEQ PO TBCR
40.0000 meq | EXTENDED_RELEASE_TABLET | Freq: Once | ORAL | Status: AC
  Administered 2013-05-20: 40 meq via ORAL
  Filled 2013-05-20: qty 2

## 2013-05-20 MED ORDER — ONDANSETRON HCL 4 MG/2ML IJ SOLN
4.0000 mg | Freq: Once | INTRAMUSCULAR | Status: AC
  Administered 2013-05-20: 4 mg via INTRAVENOUS
  Filled 2013-05-20: qty 2

## 2013-05-20 MED ORDER — KETOROLAC TROMETHAMINE 30 MG/ML IJ SOLN
30.0000 mg | Freq: Once | INTRAMUSCULAR | Status: AC
  Administered 2013-05-20: 30 mg via INTRAVENOUS
  Filled 2013-05-20: qty 1

## 2013-05-20 MED ORDER — HYDROCODONE-ACETAMINOPHEN 5-325 MG PO TABS: 1.0000 | ORAL_TABLET | Freq: Four times a day (QID) | ORAL | Status: DC | PRN

## 2013-05-20 MED ORDER — FENTANYL CITRATE 0.05 MG/ML IJ SOLN
50.0000 ug | Freq: Once | INTRAMUSCULAR | Status: AC
  Administered 2013-05-20: 50 ug via INTRAVENOUS
  Filled 2013-05-20: qty 2

## 2013-05-20 NOTE — ED Notes (Signed)
Patient ambulated without difficulty to restroom and back.

## 2013-05-20 NOTE — ED Provider Notes (Signed)
CSN: [id number]     Arrival date & time 05/20/13  0310 History   First MD Initiated Contact with Patient 05/20/13 [phone]     No chief complaint on file.  (Consider location/radiation/quality/duration/timing/severity/associated sxs/prior Treatment) Patient is a 73 y.o. female presenting with flank pain. The history is provided by the patient. No language interpreter was used.  Flank Pain This is a new problem. The current episode started more than 2 days ago. The problem occurs daily. The problem has been gradually worsening. Pertinent negatives include no chest pain, no headaches and no shortness of breath. Nothing aggravates the symptoms. Nothing relieves the symptoms. She has tried nothing for the symptoms. The treatment provided no relief.  Also has had nausea today.  Denies urinary symptoms.  Some constipation took laxatives x 2 days without relief.    Past Medical History  Diagnosis Date  . Breast cancer 09/29/2011  . Ovarian cancer 09/29/2011  . Arthritis   . Pneumonia   . Hypertension 10/29/2011   No past surgical history on file. No family history on file. History  Substance Use Topics  . Smoking status: Current Every Day Smoker -- 0.25 packs/day for 58 years  . Smokeless tobacco: Not on file     Comment: not smoking while using nicotine patch 03-26-12  . Alcohol Use: 0.6 oz/week    1 Shots of liquor per week   OB History   Grav Para Term Preterm Abortions TAB SAB Ect Mult Living                 Review of Systems  Respiratory: Negative for shortness of breath.   Cardiovascular: Negative for chest pain.  Genitourinary: Positive for flank pain.  Neurological: Negative for headaches.  All other systems reviewed and are negative.    Allergies  Diovan and Uncoded nonscreenable allergen  Home Medications   Current Outpatient Rx  Name  Route  Sig  Dispense  Refill  . Calcium Carbonate-Vitamin D (CALCIUM 600 + D PO)   Oral   Take 1 tablet by mouth 2 (two) times daily.          . Cholecalciferol (VITAMIN D3) 2000 UNITS TABS   Oral   Take 1 capsule by mouth daily.         . cloNIDine (CATAPRES) 0.1 MG tablet   Oral   Take 0.1 mg by mouth at bedtime.         . ferrous fumarate (HEMOCYTE - 106 MG FE) 325 (106 FE) MG TABS   Oral   Take 1 tablet by mouth every 7 (seven) days. No certain day         . ibuprofen (ADVIL,MOTRIN) 800 MG tablet   Oral   Take 800 mg by mouth every 8 (eight) hours as needed. For pain         . lidocaine-prilocaine (EMLA) cream   Topical   Apply 1 application topically as needed. For port         . losartan (COZAAR) 50 MG tablet   Oral   Take 50 mg by mouth daily.         . Omega-3 Fatty Acids (FISH OIL CONCENTRATE) 1000 MG CAPS   Oral   Take 1 capsule by mouth daily.         . Potassium 99 MG TABS   Oral   Take 1 tablet by mouth daily.         Marland Kitchen senna (SENOKOT) 8.6 MG tablet   Oral  Take 0.5 tablets by mouth every other day.         . tamoxifen (NOLVADEX) 20 MG tablet   Oral   Take 1 tablet (20 mg total) by mouth daily.   90 tablet   1     90 day authorized.  Dr. Jama Flavors is no longe ...   . vitamin E (VITAMIN E) 400 UNIT capsule   Oral   Take 400 Units by mouth 2 (two) times daily.          There were no vitals taken for this visit. Physical Exam  Constitutional: She is oriented to person, place, and time. She appears well-developed and well-nourished. No distress.  HENT:  Head: Normocephalic and atraumatic.  Mouth/Throat: Oropharynx is clear and moist.  Eyes: Conjunctivae are normal. Pupils are equal, round, and reactive to light.  Neck: Normal range of motion. Neck supple.  Cardiovascular: Normal rate, regular rhythm and intact distal pulses.   Pulmonary/Chest: Effort normal and breath sounds normal. She has no wheezes. She has no rales.  Abdominal: Soft. Bowel sounds are normal. There is no tenderness. There is no rebound and no guarding.  Musculoskeletal: Normal range  of motion.  Neurological: She is alert and oriented to person, place, and time.  Skin: Skin is warm and dry.  Psychiatric: She has a normal mood and affect.    ED Course  Procedures (including critical care time) Labs Review Labs Reviewed  CBC WITH DIFFERENTIAL  URINALYSIS, ROUTINE W REFLEX MICROSCOPIC   Imaging Review No results found.  MDM  CT without abnomality.  Symptoms may reflect cramping or gas from laxatives taken. Will d/c with short course of pain medication and close follow up and strict return precautions    Matilde Pottenger K Tenille Morrill-Rasch, MD 05/20/13 0630

## 2013-06-04 ENCOUNTER — Telehealth: Payer: Self-pay | Admitting: *Deleted

## 2013-06-04 NOTE — Telephone Encounter (Signed)
Left vm message to call me regarding smoking cessation

## 2013-06-05 ENCOUNTER — Telehealth: Payer: Self-pay | Admitting: *Deleted

## 2013-06-05 NOTE — Telephone Encounter (Signed)
Spoke with pt regarding smoking cessation.  She states she would like to quit and attend a class.  I sent her information on smoking cessation class.  I also gave her my phone number to call with assistance.

## 2013-07-15 ENCOUNTER — Encounter (INDEPENDENT_AMBULATORY_CARE_PROVIDER_SITE_OTHER): Payer: Self-pay

## 2013-07-15 ENCOUNTER — Ambulatory Visit (HOSPITAL_BASED_OUTPATIENT_CLINIC_OR_DEPARTMENT_OTHER): Payer: Medicare Other

## 2013-07-15 VITALS — BP 169/69 | HR 71 | Temp 97.8°F | Resp 20

## 2013-07-15 DIAGNOSIS — C50919 Malignant neoplasm of unspecified site of unspecified female breast: Secondary | ICD-10-CM

## 2013-07-15 DIAGNOSIS — C50419 Malignant neoplasm of upper-outer quadrant of unspecified female breast: Secondary | ICD-10-CM | POA: Diagnosis not present

## 2013-07-15 DIAGNOSIS — Z452 Encounter for adjustment and management of vascular access device: Secondary | ICD-10-CM | POA: Diagnosis not present

## 2013-07-15 MED ORDER — SODIUM CHLORIDE 0.9 % IJ SOLN
10.0000 mL | INTRAMUSCULAR | Status: DC | PRN
  Administered 2013-07-15: 10 mL via INTRAVENOUS
  Filled 2013-07-15: qty 10

## 2013-07-15 MED ORDER — HEPARIN SOD (PORK) LOCK FLUSH 100 UNIT/ML IV SOLN
500.0000 [IU] | Freq: Once | INTRAVENOUS | Status: AC
  Administered 2013-07-15: 500 [IU] via INTRAVENOUS
  Filled 2013-07-15: qty 5

## 2013-07-15 NOTE — Patient Instructions (Signed)
Implanted Port Instructions  An implanted port is a central line that has a round shape and is placed under the skin. It is used for long-term IV (intravenous) access for:  · Medicine.  · Fluids.  · Liquid nutrition, such as TPN (total parenteral nutrition).  · Blood samples.  Ports can be placed:  · In the chest area just below the collarbone (this is the most common place.)  · In the arms.  · In the belly (abdomen) area.  · In the legs.  PARTS OF THE PORT  A port has 2 main parts:  · The reservoir. The reservoir is round, disc-shaped, and will be a small, raised area under your skin.  · The reservoir is the part where a needle is inserted (accessed) to either give medicines or to draw blood.  · The catheter. The catheter is a long, slender tube that extends from the reservoir. The catheter is placed into a large vein.  · Medicine that is inserted into the reservoir goes into the catheter and then into the vein.  INSERTION OF THE PORT  · The port is surgically placed in either an operating room or in a procedural area (interventional radiology).  · Medicine may be given to help you relax during the procedure.  · The skin where the port will be inserted is numbed (local anesthetic).  · 1 or 2 small cuts (incisions) will be made in the skin to insert the port.  · The port can be used after it has been inserted.  INCISION SITE CARE  · The incision site may have small adhesive strips on it. This helps keep the incision site closed. Sometimes, no adhesive strips are placed. Instead of adhesive strips, a special kind of surgical glue is used to keep the incision closed.  · If adhesive strips were placed on the incision sites, do not take them off. They will fall off on their own.  · The incision site may be sore for 1 to 2 days. Pain medicine can help.  · Do not get the incision site wet. Bathe or shower as directed by your caregiver.  · The incision site should heal in 5 to 7 days. A small scar may form after the  incision has healed.  ACCESSING THE PORT  Special steps must be taken to access the port:  · Before the port is accessed, a numbing cream can be placed on the skin. This helps numb the skin over the port site.  · A sterile technique is used to access the port.  · The port is accessed with a needle. Only "non-coring" port needles should be used to access the port. Once the port is accessed, a blood return should be checked. This helps ensure the port is in the vein and is not clogged (clotted).  · If your caregiver believes your port should remain accessed, a clear (transparent) bandage will be placed over the needle site. The bandage and needle will need to be changed every week or as directed by your caregiver.  · Keep the bandage covering the needle clean and dry. Do not get it wet. Follow your caregiver's instructions on how to take a shower or bath when the port is accessed.  · If your port does not need to stay accessed, no bandage is needed over the port.  FLUSHING THE PORT  Flushing the port keeps it from getting clogged. How often the port is flushed depends on:  · If a   constant infusion is running. If a constant infusion is running, the port may not need to be flushed.  · If intermittent medicines are given.  · If the port is not being used.  For intermittent medicines:  · The port will need to be flushed:  · After medicines have been given.  · After blood has been drawn.  · As part of routine maintenance.  · A port is normally flushed with:  · Normal saline.  · Heparin.  · Follow your caregiver's advice on how often, how much, and the type of flush to use on your port.  IMPORTANT PORT INFORMATION  · Tell your caregiver if you are allergic to heparin.  · After your port is placed, you will get a manufacturer's information card. The card has information about your port. Keep this card with you at all times.  · There are many types of ports available. Know what kind of port you have.  · In case of an  emergency, it may be helpful to wear a medical alert bracelet. This can help alert health care workers that you have a port.  · The port can stay in for as long as your caregiver believes it is necessary.  · When it is time for the port to come out, surgery will be done to remove it. The surgery will be similar to how the port was put in.  · If you are in the hospital or clinic:  · Your port will be taken care of and flushed by a nurse.  · If you are at home:  · A home health care nurse may give medicines and take care of the port.  · You or a family member can get special training and directions for giving medicine and taking care of the port at home.  SEEK IMMEDIATE MEDICAL CARE IF:   · Your port does not flush or you are unable to get a blood return.  · New drainage or pus is coming from the incision.  · A bad smell is coming from the incision site.  · You develop swelling or increased redness at the incision site.  · You develop increased swelling or pain at the port site.  · You develop swelling or pain in the surrounding skin near the port.  · You have an oral temperature above 102° F (38.9° C), not controlled by medicine.  MAKE SURE YOU:   · Understand these instructions.  · Will watch your condition.  · Will get help right away if you are not doing well or get worse.  Document Released: 09/04/2005 Document Revised: 11/27/2011 Document Reviewed: 11/26/2008  [employer]® Patient Information ©2014 [employer].

## 2013-08-28 DIAGNOSIS — R7309 Other abnormal glucose: Secondary | ICD-10-CM | POA: Diagnosis not present

## 2013-08-28 DIAGNOSIS — Z79899 Other long term (current) drug therapy: Secondary | ICD-10-CM | POA: Diagnosis not present

## 2013-08-28 DIAGNOSIS — E663 Overweight: Secondary | ICD-10-CM | POA: Diagnosis not present

## 2013-08-28 DIAGNOSIS — Z23 Encounter for immunization: Secondary | ICD-10-CM | POA: Diagnosis not present

## 2013-08-28 DIAGNOSIS — I10 Essential (primary) hypertension: Secondary | ICD-10-CM | POA: Diagnosis not present

## 2013-08-28 DIAGNOSIS — E559 Vitamin D deficiency, unspecified: Secondary | ICD-10-CM | POA: Diagnosis not present

## 2013-08-28 DIAGNOSIS — F17200 Nicotine dependence, unspecified, uncomplicated: Secondary | ICD-10-CM | POA: Diagnosis not present

## 2013-08-28 DIAGNOSIS — M899 Disorder of bone, unspecified: Secondary | ICD-10-CM | POA: Diagnosis not present

## 2013-09-01 ENCOUNTER — Telehealth: Payer: Self-pay | Admitting: Oncology

## 2013-09-14 ENCOUNTER — Other Ambulatory Visit: Payer: Self-pay | Admitting: Internal Medicine

## 2013-09-30 DIAGNOSIS — L723 Sebaceous cyst: Secondary | ICD-10-CM | POA: Diagnosis not present

## 2013-09-30 DIAGNOSIS — D239 Other benign neoplasm of skin, unspecified: Secondary | ICD-10-CM | POA: Diagnosis not present

## 2013-09-30 DIAGNOSIS — L821 Other seborrheic keratosis: Secondary | ICD-10-CM | POA: Diagnosis not present

## 2013-10-07 ENCOUNTER — Other Ambulatory Visit: Payer: Self-pay | Admitting: Internal Medicine

## 2013-10-11 ENCOUNTER — Other Ambulatory Visit: Payer: Self-pay | Admitting: Internal Medicine

## 2013-10-13 ENCOUNTER — Other Ambulatory Visit: Payer: Self-pay | Admitting: Internal Medicine

## 2013-10-26 ENCOUNTER — Other Ambulatory Visit: Payer: Self-pay | Admitting: Oncology

## 2013-11-03 ENCOUNTER — Telehealth: Payer: Self-pay | Admitting: Oncology

## 2013-11-03 NOTE — Telephone Encounter (Signed)
, °

## 2013-11-04 ENCOUNTER — Other Ambulatory Visit: Payer: Medicare Other

## 2013-11-05 ENCOUNTER — Other Ambulatory Visit: Payer: Self-pay | Admitting: Oncology

## 2013-11-06 ENCOUNTER — Ambulatory Visit (HOSPITAL_BASED_OUTPATIENT_CLINIC_OR_DEPARTMENT_OTHER): Payer: Medicare Other

## 2013-11-06 ENCOUNTER — Telehealth: Payer: Self-pay | Admitting: *Deleted

## 2013-11-06 VITALS — BP 170/71 | HR 80 | Temp 97.0°F

## 2013-11-06 DIAGNOSIS — C50919 Malignant neoplasm of unspecified site of unspecified female breast: Secondary | ICD-10-CM

## 2013-11-06 DIAGNOSIS — Z95828 Presence of other vascular implants and grafts: Secondary | ICD-10-CM

## 2013-11-06 DIAGNOSIS — Z452 Encounter for adjustment and management of vascular access device: Secondary | ICD-10-CM

## 2013-11-06 MED ORDER — SODIUM CHLORIDE 0.9 % IJ SOLN
10.0000 mL | INTRAMUSCULAR | Status: DC | PRN
  Administered 2013-11-06: 10 mL via INTRAVENOUS
  Filled 2013-11-06: qty 10

## 2013-11-06 MED ORDER — HEPARIN SOD (PORK) LOCK FLUSH 100 UNIT/ML IV SOLN
500.0000 [IU] | Freq: Once | INTRAVENOUS | Status: AC
  Administered 2013-11-06: 500 [IU] via INTRAVENOUS
  Filled 2013-11-06: qty 5

## 2013-11-06 NOTE — Telephone Encounter (Signed)
Spoke with patient. Pt needs to have port flushed today, will come in today at 10:45.

## 2013-11-06 NOTE — Telephone Encounter (Signed)
Message copied by Patton Salles on Thu Nov 06, 2013  9:32 AM ------      Message from: Evlyn Clines P      Created: Wed Nov 05, 2013 10:27 PM       MD appointment was moved from 2-20 to 3-19.       She has PAC. I cannot tell when it was flushed last (IV assessment is no longer on my Doc Flowsheets, which is where I know to look for this).      RN please see when PAC last flushed in case this is needed prior to 3-19.  She does have flush with the 3-19 visit if that is correct.      Thank you      Cc LA, TH ------

## 2013-11-06 NOTE — Patient Instructions (Signed)

## 2013-11-07 ENCOUNTER — Other Ambulatory Visit: Payer: Medicare Other

## 2013-11-07 ENCOUNTER — Ambulatory Visit: Payer: Medicare Other | Admitting: Oncology

## 2013-11-10 ENCOUNTER — Telehealth: Payer: Self-pay | Admitting: *Deleted

## 2013-11-10 NOTE — Telephone Encounter (Signed)
Pt called for an appt in July- discussed Md's schedule is not available/out and to call back in about 1-2 months for an appt. Pt verbalized she would call back.

## 2013-11-11 DIAGNOSIS — L821 Other seborrheic keratosis: Secondary | ICD-10-CM | POA: Diagnosis not present

## 2013-11-11 DIAGNOSIS — D239 Other benign neoplasm of skin, unspecified: Secondary | ICD-10-CM | POA: Diagnosis not present

## 2013-11-24 ENCOUNTER — Encounter: Payer: Self-pay | Admitting: *Deleted

## 2013-11-24 ENCOUNTER — Other Ambulatory Visit: Payer: Self-pay | Admitting: Oncology

## 2013-11-30 ENCOUNTER — Other Ambulatory Visit: Payer: Self-pay | Admitting: Oncology

## 2013-12-04 ENCOUNTER — Other Ambulatory Visit (HOSPITAL_BASED_OUTPATIENT_CLINIC_OR_DEPARTMENT_OTHER): Payer: Medicare Other

## 2013-12-04 ENCOUNTER — Ambulatory Visit (HOSPITAL_BASED_OUTPATIENT_CLINIC_OR_DEPARTMENT_OTHER): Payer: Medicare Other

## 2013-12-04 ENCOUNTER — Encounter: Payer: Self-pay | Admitting: Oncology

## 2013-12-04 ENCOUNTER — Ambulatory Visit (HOSPITAL_BASED_OUTPATIENT_CLINIC_OR_DEPARTMENT_OTHER): Payer: Medicare Other | Admitting: Oncology

## 2013-12-04 ENCOUNTER — Telehealth: Payer: Self-pay | Admitting: Oncology

## 2013-12-04 VITALS — BP 154/62 | HR 79 | Temp 99.0°F

## 2013-12-04 VITALS — BP 169/78 | HR 78 | Temp 98.7°F | Resp 18 | Ht 66.0 in | Wt 177.0 lb

## 2013-12-04 DIAGNOSIS — C50912 Malignant neoplasm of unspecified site of left female breast: Secondary | ICD-10-CM

## 2013-12-04 DIAGNOSIS — Z1231 Encounter for screening mammogram for malignant neoplasm of breast: Secondary | ICD-10-CM

## 2013-12-04 DIAGNOSIS — Z8543 Personal history of malignant neoplasm of ovary: Secondary | ICD-10-CM

## 2013-12-04 DIAGNOSIS — C50919 Malignant neoplasm of unspecified site of unspecified female breast: Secondary | ICD-10-CM

## 2013-12-04 DIAGNOSIS — Z95828 Presence of other vascular implants and grafts: Secondary | ICD-10-CM

## 2013-12-04 LAB — CBC WITH DIFFERENTIAL/PLATELET
BASO%: 0.6 % (ref 0.0–2.0)
Basophils Absolute: 0 10*3/uL (ref 0.0–0.1)
EOS%: 0.8 % (ref 0.0–7.0)
Eosinophils Absolute: 0.1 10*3/uL (ref 0.0–0.5)
HCT: 36.8 % (ref 34.8–46.6)
HGB: 12.3 g/dL (ref 11.6–15.9)
LYMPH%: 24.6 % (ref 14.0–49.7)
MCH: 32.8 pg (ref 25.1–34.0)
MCHC: 33.4 g/dL (ref 31.5–36.0)
MCV: 98.2 fL (ref 79.5–101.0)
MONO#: 0.5 10*3/uL (ref 0.1–0.9)
MONO%: 7.4 % (ref 0.0–14.0)
NEUT#: 4.5 10*3/uL (ref 1.5–6.5)
NEUT%: 66.6 % (ref 38.4–76.8)
Platelets: 102 10*3/uL — ABNORMAL LOW (ref 145–400)
RBC: 3.75 10*6/uL (ref 3.70–5.45)
RDW: 13.5 % (ref 11.2–14.5)
WBC: 6.8 10*3/uL (ref 3.9–10.3)
lymph#: 1.7 10*3/uL (ref 0.9–3.3)

## 2013-12-04 LAB — COMPREHENSIVE METABOLIC PANEL (CC13)
ALT: 9 U/L (ref 0–55)
AST: 15 U/L (ref 5–34)
Albumin: 3.2 g/dL — ABNORMAL LOW (ref 3.5–5.0)
Alkaline Phosphatase: 53 U/L (ref 40–150)
Anion Gap: 11 mEq/L (ref 3–11)
BUN: 10.6 mg/dL (ref 7.0–26.0)
CO2: 24 mEq/L (ref 22–29)
Calcium: 9.2 mg/dL (ref 8.4–10.4)
Chloride: 108 mEq/L (ref 98–109)
Creatinine: 0.7 mg/dL (ref 0.6–1.1)
Glucose: 109 mg/dl (ref 70–140)
Potassium: 3.4 mEq/L — ABNORMAL LOW (ref 3.5–5.1)
Sodium: 142 mEq/L (ref 136–145)
Total Bilirubin: 0.47 mg/dL (ref 0.20–1.20)
Total Protein: 7.3 g/dL (ref 6.4–8.3)

## 2013-12-04 MED ORDER — HEPARIN SOD (PORK) LOCK FLUSH 100 UNIT/ML IV SOLN
500.0000 [IU] | Freq: Once | INTRAVENOUS | Status: AC
  Administered 2013-12-04: 500 [IU] via INTRAVENOUS
  Filled 2013-12-04: qty 5

## 2013-12-04 MED ORDER — CLONIDINE HCL 0.1 MG PO TABS: 0.1000 mg | ORAL_TABLET | Freq: Every day | ORAL | Status: DC

## 2013-12-04 MED ORDER — SODIUM CHLORIDE 0.9 % IJ SOLN
10.0000 mL | INTRAMUSCULAR | Status: DC | PRN
  Administered 2013-12-04: 10 mL via INTRAVENOUS
  Filled 2013-12-04: qty 10

## 2013-12-04 NOTE — Telephone Encounter (Signed)
, °

## 2013-12-04 NOTE — Progress Notes (Signed)
OFFICE PROGRESS NOTE   12/04/2013   Physicians: K.Karlton Lemon, D.ClarkePearson  INTERVAL HISTORY:  Patient is seen, alone for visit, in scheduled follow up of left breast cancer for which she continues tamoxifen (intolerant to AI) and also remote history of IIIC ovarian cancer. Most recent mammograms were at [facility] in 10-2012, ordered now and patient aware. Last breast MRI was 01-2013. Last CT AP was 05-20-2013; she is to see Dr Josephina Shih in ~ July. Patient has PAC in, which is kept flushed thru this office; peripheral venous access is extremely poor such that patient prefers to keep the PAC.  She continues to smoke "only when I drink", so < 1/2 pack/ week. She was able to avoid smoking entirely during recent several day trip out of town, and is proud of this. I have encouraged her to continue efforts to stop entirely.  She has no complaints that seem referable to the cancer history, prior treatment or present tamoxifen, which she has taken since Aug 2011.  Dr Karlton Lemon is changing practice organization and patient does not feel she can afford the yearly fee, tho she appreciates Dr Roland Earl care very much. She is looking into other primary care options; note she is having some problems with regular medications (see below).  ONCOLOGIC HISTORY Oncology History   Multifocal T1N0 left breast at lumpectomy and sentinel node evaluation March 2010. ER PR +, Her2 negative. Treated with RT and hormonal blockade     Breast cancer   09/29/2011 Initial Diagnosis Breast cancer   The breast cancer was diagnosed in March 2010, multifocal T1N0 at lumpectomy and sentinel node evaluation, ER/PR positive and HER 2 negative. She had local radiation, was intolerant to Femara and Aromasin due to severe arthralgias, and has been on tamoxifen since Aug 2011, planned for at least 5 years. Last bilateral mammograms were at [facility] 11-12-12 and last breast MRI was 01-2013, without findings of concern in breasts or nodal areas.    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 Surgery was TAH/BSO/omentectomy and radical complete debulking including anterior rectal resection with anastomosis. She had 6 cycles of adjuvant taxol/ carboplatin from 04-18-2005 thru 08-14-2005. She is followed yearly by gyn oncology.  Review of systems as above, also: No recent infectious illness. Denies increased SOB or other respiratory symptoms. No noted changes on breast self exam. No abdominal or pelvic pain. Bowels ok. No swelling LE. No bleeding. Remainder of 10 point Review of Systems negative.  Objective:  Vital signs in last 24 hours:  BP 169/78  Pulse 78  Temp(Src) 98.7 F (37.1 C) (Oral)  Resp 18  Ht '5\' 6"'  (1.676 m)  Wt 177 lb (80.287 kg)  BMI 28.58 kg/m2  Weight is stable  Alert, oriented and appropriate. Ambulatory without difficulty.    HEENT:PERRL, sclerae not icteric. Oral mucosa moist without lesions, posterior pharynx clear.  Neck supple. No JVD.  Lymphatics:no cervical,suraclavicular, axillary or inguinal adenopathy Resp: diminished BS thruout otherwise clear to auscultation bilaterally and hyperresonant to percussion bilaterally Cardio: regular rate and rhythm. No gallop. GI: soft, nontender, not distended, no mass or organomegaly. Normally active bowel sounds. Surgical incision not remarkable. Musculoskeletal/ Extremities: without pitting edema, cords, tenderness Neuro: no peripheral neuropathy. Otherwise nonfocal Skin without rash, ecchymosis, petechiae Breasts: right without dominant mass, skin or nipple findings. Left with unremarkable lumpectomy scar and no other findings of  concern. Axillae benign. Portacath-without erythema or tenderness  Lab Results:  Results for orders placed in visit on 12/04/13  CBC WITH DIFFERENTIAL      Result Value  Ref Range   WBC 6.8  3.9 - 10.3 10e3/uL   NEUT# 4.5  1.5 - 6.5 10e3/uL   HGB 12.3  11.6 - 15.9 g/dL   HCT 36.8  34.8 - 46.6 %   Platelets 102 (*) 145 - 400 10e3/uL   MCV 98.2  79.5 - 101.0 fL   MCH 32.8  25.1 - 34.0 pg   MCHC 33.4  31.5 - 36.0 g/dL   RBC 3.75  3.70 - 5.45 10e6/uL   RDW 13.5  11.2 - 14.5 %   lymph# 1.7  0.9 - 3.3 10e3/uL   MONO# 0.5  0.1 - 0.9 10e3/uL   Eosinophils Absolute 0.1  0.0 - 0.5 10e3/uL   Basophils Absolute 0.0  0.0 - 0.1 10e3/uL   NEUT% 66.6  38.4 - 76.8 %   LYMPH% 24.6  14.0 - 49.7 %   MONO% 7.4  0.0 - 14.0 %   EOS% 0.8  0.0 - 7.0 %   BASO% 0.6  0.0 - 2.0 %  COMPREHENSIVE METABOLIC PANEL (ZO10)      Result Value Ref Range   Sodium 142  136 - 145 mEq/L   Potassium 3.4 (*) 3.5 - 5.1 mEq/L   Chloride 108  98 - 109 mEq/L   CO2 24  22 - 29 mEq/L   Glucose 109  70 - 140 mg/dl   BUN 10.6  7.0 - 26.0 mg/dL   Creatinine 0.7  0.6 - 1.1 mg/dL   Total Bilirubin 0.47  0.20 - 1.20 mg/dL   Alkaline Phosphatase 53  40 - 150 U/L   AST 15  5 - 34 U/L   ALT 9  0 - 55 U/L   Total Protein 7.3  6.4 - 8.3 g/dL   Albumin 3.2 (*) 3.5 - 5.0 g/dL   Calcium 9.2  8.4 - 10.4 mg/dL   Anion Gap 11  3 - 11 mEq/L     Studies/Results:  No results found.  Medications: I have reviewed the patient's current medications. Cozaar has caused joint pain which improves with she omits that drug, tho she takes it still intermittently depending on blood pressure at home. She needs refill on clonidine, which we have done given uncertainty about PCP for now.    Assessment/Plan: 1. Multifocal left breast cancer: history as above, continuing tamoxifen planned for 5 years, thru Aug 2016. I will see her back in 6 months, or sooner if needed. Mammograms at [facility] now 2. IIIC ovarian carcinoma: history as above, now out ~ 9 years and clinically doing well. She is due back to gyn oncology which we will schedule.  I do not believe that she has wanted genetics counseling. 3.PAC in, which needs  flush every 8 wks. This was placed by Dr Bubba Camp in 2006. Extremely difficult peripheral IV access including for blood draws.  4.long tobacco,nearly DCd. 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 CT Abd 2010 had normal spleen. Follow.  6.environmental allergies  7.shingles vaccine done ~ July 2014. Flu vaccine refused in past. 8.hypertension: states up to ~ 180/79 at home when she stops Cozaar. She is not sure what other antihypertensives have been used, but does not recall HCTZ or B blocker   Patient has had questions  answered and is in agreement with recommendations and plan. TIme spent 25 min including >50% counseling and coordination of care.    Gordy Levan, MD   12/04/2013, 8:39 PM

## 2013-12-05 ENCOUNTER — Telehealth: Payer: Self-pay

## 2013-12-05 NOTE — Telephone Encounter (Signed)
Message copied by Baruch Merl on Fri Dec 05, 2013  6:40 PM ------      Message from: Gordy Levan      Created: Fri Dec 05, 2013  8:29 AM       Labs seen and need follow up: patient is to let us know if she is able to set up new MD herself or if she needs other referral, and may need other BP med until she gets to that new MD. When you speak to her, let her know K just slightly low at 3.4, so she needs to continue potassium ------

## 2013-12-05 NOTE — Telephone Encounter (Signed)
Reviewed potassium level as noted below by Dr. [name].  Monique Watson will call our office if she ahs difficulty getting a new physician or needs refill on BP med prior to PCP visit.

## 2013-12-09 DIAGNOSIS — Z803 Family history of malignant neoplasm of breast: Secondary | ICD-10-CM | POA: Diagnosis not present

## 2013-12-09 DIAGNOSIS — Z1231 Encounter for screening mammogram for malignant neoplasm of breast: Secondary | ICD-10-CM | POA: Diagnosis not present

## 2014-02-04 ENCOUNTER — Ambulatory Visit (HOSPITAL_BASED_OUTPATIENT_CLINIC_OR_DEPARTMENT_OTHER): Payer: Medicare Other

## 2014-02-04 VITALS — BP 170/85 | HR 79 | Temp 97.8°F

## 2014-02-04 DIAGNOSIS — C50919 Malignant neoplasm of unspecified site of unspecified female breast: Secondary | ICD-10-CM

## 2014-02-04 DIAGNOSIS — Z452 Encounter for adjustment and management of vascular access device: Secondary | ICD-10-CM | POA: Diagnosis not present

## 2014-02-04 DIAGNOSIS — Z95828 Presence of other vascular implants and grafts: Secondary | ICD-10-CM

## 2014-02-04 MED ORDER — HEPARIN SOD (PORK) LOCK FLUSH 100 UNIT/ML IV SOLN
500.0000 [IU] | Freq: Once | INTRAVENOUS | Status: AC
  Administered 2014-02-04: 500 [IU] via INTRAVENOUS
  Filled 2014-02-04: qty 5

## 2014-02-04 MED ORDER — SODIUM CHLORIDE 0.9 % IJ SOLN
10.0000 mL | INTRAMUSCULAR | Status: DC | PRN
  Administered 2014-02-04: 10 mL via INTRAVENOUS
  Filled 2014-02-04: qty 10

## 2014-02-04 NOTE — Patient Instructions (Signed)

## 2014-03-11 DIAGNOSIS — Z961 Presence of intraocular lens: Secondary | ICD-10-CM | POA: Diagnosis not present

## 2014-04-01 ENCOUNTER — Ambulatory Visit (HOSPITAL_BASED_OUTPATIENT_CLINIC_OR_DEPARTMENT_OTHER): Payer: Medicare Other

## 2014-04-01 VITALS — BP 155/72 | HR 70 | Temp 98.0°F

## 2014-04-01 DIAGNOSIS — C50919 Malignant neoplasm of unspecified site of unspecified female breast: Secondary | ICD-10-CM

## 2014-04-01 DIAGNOSIS — Z452 Encounter for adjustment and management of vascular access device: Secondary | ICD-10-CM

## 2014-04-01 DIAGNOSIS — Z95828 Presence of other vascular implants and grafts: Secondary | ICD-10-CM

## 2014-04-01 MED ORDER — HEPARIN SOD (PORK) LOCK FLUSH 100 UNIT/ML IV SOLN
500.0000 [IU] | Freq: Once | INTRAVENOUS | Status: AC
  Administered 2014-04-01: 500 [IU] via INTRAVENOUS
  Filled 2014-04-01: qty 5

## 2014-04-01 MED ORDER — SODIUM CHLORIDE 0.9 % IJ SOLN
10.0000 mL | INTRAMUSCULAR | Status: DC | PRN
  Administered 2014-04-01: 10 mL via INTRAVENOUS
  Filled 2014-04-01: qty 10

## 2014-04-01 NOTE — Patient Instructions (Signed)

## 2014-04-17 ENCOUNTER — Ambulatory Visit: Payer: Medicare Other | Attending: Gynecology | Admitting: Gynecology

## 2014-06-03 ENCOUNTER — Telehealth: Payer: Self-pay | Admitting: Oncology

## 2014-06-03 ENCOUNTER — Ambulatory Visit (HOSPITAL_BASED_OUTPATIENT_CLINIC_OR_DEPARTMENT_OTHER): Payer: Medicare Other

## 2014-06-03 VITALS — BP 143/75 | HR 76 | Temp 98.1°F

## 2014-06-03 DIAGNOSIS — Z95828 Presence of other vascular implants and grafts: Secondary | ICD-10-CM

## 2014-06-03 DIAGNOSIS — Z452 Encounter for adjustment and management of vascular access device: Secondary | ICD-10-CM

## 2014-06-03 DIAGNOSIS — C50919 Malignant neoplasm of unspecified site of unspecified female breast: Secondary | ICD-10-CM | POA: Diagnosis not present

## 2014-06-03 MED ORDER — HEPARIN SOD (PORK) LOCK FLUSH 100 UNIT/ML IV SOLN
500.0000 [IU] | Freq: Once | INTRAVENOUS | Status: AC
  Administered 2014-06-03: 500 [IU] via INTRAVENOUS
  Filled 2014-06-03: qty 5

## 2014-06-03 MED ORDER — SODIUM CHLORIDE 0.9 % IJ SOLN
10.0000 mL | INTRAMUSCULAR | Status: DC | PRN
  Administered 2014-06-03: 10 mL via INTRAVENOUS
  Filled 2014-06-03: qty 10

## 2014-06-03 NOTE — Patient Instructions (Signed)

## 2014-06-03 NOTE — Telephone Encounter (Signed)
, °

## 2014-06-09 DIAGNOSIS — R0789 Other chest pain: Secondary | ICD-10-CM | POA: Diagnosis not present

## 2014-06-09 DIAGNOSIS — F17200 Nicotine dependence, unspecified, uncomplicated: Secondary | ICD-10-CM | POA: Diagnosis not present

## 2014-06-09 DIAGNOSIS — I10 Essential (primary) hypertension: Secondary | ICD-10-CM | POA: Diagnosis not present

## 2014-06-09 DIAGNOSIS — E559 Vitamin D deficiency, unspecified: Secondary | ICD-10-CM | POA: Diagnosis not present

## 2014-06-09 DIAGNOSIS — Z136 Encounter for screening for cardiovascular disorders: Secondary | ICD-10-CM | POA: Diagnosis not present

## 2014-06-09 DIAGNOSIS — Z131 Encounter for screening for diabetes mellitus: Secondary | ICD-10-CM | POA: Diagnosis not present

## 2014-06-09 DIAGNOSIS — D649 Anemia, unspecified: Secondary | ICD-10-CM | POA: Diagnosis not present

## 2014-06-09 DIAGNOSIS — E876 Hypokalemia: Secondary | ICD-10-CM | POA: Diagnosis not present

## 2014-06-10 ENCOUNTER — Telehealth: Payer: Self-pay | Admitting: *Deleted

## 2014-06-10 NOTE — Telephone Encounter (Signed)
Received copy of labs from [place] at [place]. Copy given to Dr. [name] for review and original sent to HIM to be scanned into patients chart.

## 2014-07-06 ENCOUNTER — Other Ambulatory Visit: Payer: Self-pay | Admitting: *Deleted

## 2014-07-06 DIAGNOSIS — C50912 Malignant neoplasm of unspecified site of left female breast: Secondary | ICD-10-CM

## 2014-07-06 DIAGNOSIS — C569 Malignant neoplasm of unspecified ovary: Secondary | ICD-10-CM

## 2014-07-06 MED ORDER — CLONIDINE HCL 0.1 MG PO TABS: 0.1000 mg | ORAL_TABLET | Freq: Every day | ORAL | Status: DC

## 2014-07-06 MED ORDER — TAMOXIFEN CITRATE 20 MG PO TABS: 20.0000 mg | ORAL_TABLET | Freq: Every day | ORAL | Status: DC

## 2014-07-12 ENCOUNTER — Other Ambulatory Visit: Payer: Self-pay | Admitting: Oncology

## 2014-07-12 DIAGNOSIS — C50912 Malignant neoplasm of unspecified site of left female breast: Secondary | ICD-10-CM

## 2014-07-12 DIAGNOSIS — C569 Malignant neoplasm of unspecified ovary: Secondary | ICD-10-CM

## 2014-07-15 ENCOUNTER — Other Ambulatory Visit (HOSPITAL_BASED_OUTPATIENT_CLINIC_OR_DEPARTMENT_OTHER): Payer: Medicare Other

## 2014-07-15 ENCOUNTER — Telehealth: Payer: Self-pay | Admitting: Oncology

## 2014-07-15 ENCOUNTER — Encounter: Payer: Self-pay | Admitting: Oncology

## 2014-07-15 ENCOUNTER — Ambulatory Visit (HOSPITAL_BASED_OUTPATIENT_CLINIC_OR_DEPARTMENT_OTHER): Payer: Medicare Other | Admitting: Oncology

## 2014-07-15 ENCOUNTER — Ambulatory Visit: Payer: Medicare Other

## 2014-07-15 VITALS — BP 146/69 | HR 80 | Temp 98.6°F | Resp 18 | Ht 66.0 in | Wt 185.2 lb

## 2014-07-15 DIAGNOSIS — C50812 Malignant neoplasm of overlapping sites of left female breast: Secondary | ICD-10-CM | POA: Diagnosis not present

## 2014-07-15 DIAGNOSIS — Z23 Encounter for immunization: Secondary | ICD-10-CM | POA: Diagnosis not present

## 2014-07-15 DIAGNOSIS — I10 Essential (primary) hypertension: Secondary | ICD-10-CM

## 2014-07-15 DIAGNOSIS — Z8543 Personal history of malignant neoplasm of ovary: Secondary | ICD-10-CM | POA: Diagnosis not present

## 2014-07-15 DIAGNOSIS — C569 Malignant neoplasm of unspecified ovary: Secondary | ICD-10-CM | POA: Diagnosis not present

## 2014-07-15 DIAGNOSIS — C50912 Malignant neoplasm of unspecified site of left female breast: Secondary | ICD-10-CM

## 2014-07-15 DIAGNOSIS — Z95828 Presence of other vascular implants and grafts: Secondary | ICD-10-CM

## 2014-07-15 DIAGNOSIS — D696 Thrombocytopenia, unspecified: Secondary | ICD-10-CM | POA: Diagnosis not present

## 2014-07-15 LAB — CBC WITH DIFFERENTIAL/PLATELET
BASO%: 0.8 % (ref 0.0–2.0)
Basophils Absolute: 0 10*3/uL (ref 0.0–0.1)
EOS%: 1.1 % (ref 0.0–7.0)
Eosinophils Absolute: 0.1 10*3/uL (ref 0.0–0.5)
HCT: 38.4 % (ref 34.8–46.6)
HGB: 12.6 g/dL (ref 11.6–15.9)
LYMPH%: 29.7 % (ref 14.0–49.7)
MCH: 32.3 pg (ref 25.1–34.0)
MCHC: 32.8 g/dL (ref 31.5–36.0)
MCV: 98.4 fL (ref 79.5–101.0)
MONO#: 0.4 10*3/uL (ref 0.1–0.9)
MONO%: 6.7 % (ref 0.0–14.0)
NEUT#: 3.4 10*3/uL (ref 1.5–6.5)
NEUT%: 61.7 % (ref 38.4–76.8)
Platelets: 113 10*3/uL — ABNORMAL LOW (ref 145–400)
RBC: 3.9 10*6/uL (ref 3.70–5.45)
RDW: 13 % (ref 11.2–14.5)
WBC: 5.5 10*3/uL (ref 3.9–10.3)
lymph#: 1.6 10*3/uL (ref 0.9–3.3)

## 2014-07-15 LAB — COMPREHENSIVE METABOLIC PANEL (CC13)
ALT: 15 U/L (ref 0–55)
AST: 16 U/L (ref 5–34)
Albumin: 3.2 g/dL — ABNORMAL LOW (ref 3.5–5.0)
Alkaline Phosphatase: 47 U/L (ref 40–150)
Anion Gap: 7 mEq/L (ref 3–11)
BUN: 13.2 mg/dL (ref 7.0–26.0)
CO2: 26 mEq/L (ref 22–29)
Calcium: 8.8 mg/dL (ref 8.4–10.4)
Chloride: 110 mEq/L — ABNORMAL HIGH (ref 98–109)
Creatinine: 0.8 mg/dL (ref 0.6–1.1)
Glucose: 112 mg/dl (ref 70–140)
Potassium: 3.9 mEq/L (ref 3.5–5.1)
Sodium: 143 mEq/L (ref 136–145)
Total Bilirubin: 0.57 mg/dL (ref 0.20–1.20)
Total Protein: 7.2 g/dL (ref 6.4–8.3)

## 2014-07-15 MED ORDER — INFLUENZA VAC SPLIT QUAD 0.5 ML IM SUSY
0.5000 mL | PREFILLED_SYRINGE | Freq: Once | INTRAMUSCULAR | Status: AC
  Administered 2014-07-15: 0.5 mL via INTRAMUSCULAR
  Filled 2014-07-15: qty 0.5

## 2014-07-15 MED ORDER — SODIUM CHLORIDE 0.9 % IJ SOLN
10.0000 mL | INTRAMUSCULAR | Status: DC | PRN
  Administered 2014-07-15: 10 mL via INTRAVENOUS
  Filled 2014-07-15: qty 10

## 2014-07-15 MED ORDER — HEPARIN SOD (PORK) LOCK FLUSH 100 UNIT/ML IV SOLN
500.0000 [IU] | Freq: Once | INTRAVENOUS | Status: AC
  Administered 2014-07-15: 500 [IU] via INTRAVENOUS
  Filled 2014-07-15: qty 5

## 2014-07-15 NOTE — Progress Notes (Signed)
OFFICE PROGRESS NOTE   07/15/2014   Physicians: D.ClarkePearson, E.Barnes, T.Brackbill (new consult 07-20-14)  INTERVAL HISTORY:  Patient is seen, alone for visit, in scheduled follow up of left breast cancer for which she continues adjuvant tamoxifen, this to complete 5 years in Aug 2016. Last bilateral tomo mammograms were at [facility] 12-09-13, no mammographic findings of concern and scattered fibroglandular densities.  She also has history of IIIC ovarian cancer 03-2005, not known recurrent. She has yearly follow up with Dr Josephina Shih next 07-17-14. She has chronic mild thrombocytopenia, not symptomatic. She still has PAC, flushed at this office.  Patient has not noticed any changes in breasts bilaterally. She continues with significant hot flashes, which hopefully will improve after she is off tamoxifen.  She denies abdominal or pelvic discomfort. Bowels are moving well with stool softener.   PCP has recently changed to Dr Drema Dallas.  Patient has stopped smoking as of ~ 4 weeks ago, is using nicotine patches which are helpful. She has been "eating constantly" since she stopped smoking, discussed drinking water instead of eating, as she needs more hydration also with the hot flashes.  She had one episode of chest pain a few weeks ago, which occurred as she was driving home from church, was associated with SOB and with nausea, lasted ~ 3 hours before resolving. She did not seek medical attention then. She told Dr Drema Dallas about this afterwards, and has been referred to cardiology upcoming. I have told patient that she needs to go directly to ED if symptoms recur prior to cardiology visit.     PAC in, which she prefers to keep due to very poor peripheral IV access. Flushed today. Flu vaccine given today. Referral to genetics counselors done today.  ONCOLOGIC HISTORY Oncology History   Multifocal T1N0 left breast at lumpectomy and sentinel node evaluation March 2010. ER PR +, Her2 negative. Treated  with RT and hormonal blockade     Breast cancer   09/29/2011 Initial Diagnosis Breast cancer  The breast cancer was diagnosed in March 2010, multifocal T1N0 at lumpectomy and sentinel node evaluation, ER/PR positive and HER 2 negative. She had local radiation, was intolerant to Femara and Aromasin due to severe arthralgias, and has been on tamoxifen since Aug 2011, planned for at least 5 years. Last bilateral mammograms were at [facility] 12-09-13 and last breast MRI was 01-2013, without findings of concern in breasts or nodal areas.  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 Surgery was TAH/BSO/omentectomy and radical complete debulking including anterior rectal resection with anastomosis. She had 6 cycles of adjuvant taxol/ carboplatin from 04-18-2005 thru 08-14-2005. She is followed yearly by gyn oncology. Last imaging was CT AP 05-2013.   Review of systems as above, also: No fever or symptoms of infection. No increased SOB or cough. No new or different pain. No bleeding or unusual bruising. No LE swelling.  Remainder of 10 point Review of Systems negative.  Objective:  Vital signs in last 24 hours:  BP 146/69  Pulse 80  Temp(Src) 98.6 F (37 C) (Oral)  Resp 18  Ht '5\' 6"'  (1.676 m)  Wt 185 lb 3 oz (84 kg)  BMI 29.90 kg/m2 weight is up 8 lbs from 11-2013.  Alert, oriented and appropriate. Ambulatory without difficulty.  No alopecia. Diaphoretic with hot flash during exam.  HEENT:PERRL, sclerae not icteric. Oral  mucosa moist without lesions, posterior pharynx clear.  Neck supple. No JVD.  Lymphatics:no cervical,suraclavicular, axillary or inguinal adenopathy Resp: diminished BS thruout otherwise clear to auscultation bilaterally and no dullness to percussion bilaterally Cardio: regular rate and rhythm. No gallop. GI: soft,  nontender, not distended, no mass or organomegaly. Normally active bowel sounds. Surgical incision not remarkable. Musculoskeletal/ Extremities: without pitting edema, cords, tenderness Neuro: no peripheral neuropathy. Otherwise nonfocal Skin without rash, ecchymosis, petechiae Breasts: Left with well healed lumpectomy scar, otherwise bilaterally without dominant mass, skin or nipple findings. Axillae benign. Portacath-without erythema or tenderness  Lab Results:  Results for orders placed in visit on 07/15/14  CBC WITH DIFFERENTIAL      Result Value Ref Range   WBC 5.5  3.9 - 10.3 10e3/uL   NEUT# 3.4  1.5 - 6.5 10e3/uL   HGB 12.6  11.6 - 15.9 g/dL   HCT 38.4  34.8 - 46.6 %   Platelets 113 (*) 145 - 400 10e3/uL   MCV 98.4  79.5 - 101.0 fL   MCH 32.3  25.1 - 34.0 pg   MCHC 32.8  31.5 - 36.0 g/dL   RBC 3.90  3.70 - 5.45 10e6/uL   RDW 13.0  11.2 - 14.5 %   lymph# 1.6  0.9 - 3.3 10e3/uL   MONO# 0.4  0.1 - 0.9 10e3/uL   Eosinophils Absolute 0.1  0.0 - 0.5 10e3/uL   Basophils Absolute 0.0  0.0 - 0.1 10e3/uL   NEUT% 61.7  38.4 - 76.8 %   LYMPH% 29.7  14.0 - 49.7 %   MONO% 6.7  0.0 - 14.0 %   EOS% 1.1  0.0 - 7.0 %   BASO% 0.8  0.0 - 2.0 %  CBC stable including platelets as above.  CMET available after visit normal with exception of chloride 110 and albumin 3.2  CA 125 available after visit 11 by new lab methodology; result using old lab method for comparison still pending at time of this transcription.  Studies/Results:  No results found.  Medications: I have reviewed the patient's current medications.  DISCUSSION: patient has not had genetics evaluation, but is open to doing that particularly as we have discussed fact that this could impact her daughter's medical care and her own follow up. Referral made to genetics counselors. Encouraged her to continue off cigarettes.   Assessment/Plan:  1. Multifocal left breast cancer: history as above, continuing tamoxifen planned for 5  years, thru Aug 2016. I will see her back in 6 months, or sooner if needed. Mammograms at [facility] in late march. Genetics counseling 2. IIIC ovarian carcinoma: history as above, now out ~ 9 years and clinically doing well. She is due back to gyn oncology upcoming. Genetics counseling 3.PAC in, which needs flush every 8 wks. This was placed by Dr Bubba Camp in 2006. Extremely difficult peripheral IV access including for blood draws. Keep this for now. 4.long tobacco recently [facility]!! 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 CT Abd 2010 had normal spleen. Follow.  6.environmental allergies  7.shingles vaccine done ~ July 2014. Flu vaccine given today  8.hypertension. Recent episode of chest pain, cardiology evaluation upcoming per Dr Drema Dallas. 9.weight gain since cigarettes DCd. Discussed.  All questions answered. Will continue PAC flushes, will keep appointments with gyn oncology and cardiology, mammograms in March and I will see her in ~ 8 months, which will be close to 5 years  on tamoxifen Time spent 25 min including >50% discussion and coordination of care  cc this note Dr Laqueta Linden, MD   07/15/2014, 9:38 AM

## 2014-07-15 NOTE — Patient Instructions (Signed)

## 2014-07-16 LAB — CA 125: CA 125: 11 U/mL (ref <35)

## 2014-07-16 LAB — CA 125(PREVIOUS METHOD): CA 125: 12.6 U/mL (ref 0.0–30.2)

## 2014-07-17 ENCOUNTER — Encounter: Payer: Self-pay | Admitting: Gynecology

## 2014-07-17 ENCOUNTER — Ambulatory Visit: Payer: Medicare Other | Attending: Gynecology | Admitting: Gynecology

## 2014-07-17 VITALS — BP 177/76 | HR 76 | Temp 98.5°F | Resp 20 | Ht 66.0 in | Wt 186.5 lb

## 2014-07-17 DIAGNOSIS — I10 Essential (primary) hypertension: Secondary | ICD-10-CM | POA: Insufficient documentation

## 2014-07-17 DIAGNOSIS — F17210 Nicotine dependence, cigarettes, uncomplicated: Secondary | ICD-10-CM | POA: Insufficient documentation

## 2014-07-17 DIAGNOSIS — C569 Malignant neoplasm of unspecified ovary: Secondary | ICD-10-CM | POA: Diagnosis not present

## 2014-07-17 DIAGNOSIS — Z8543 Personal history of malignant neoplasm of ovary: Secondary | ICD-10-CM

## 2014-07-17 DIAGNOSIS — Z9221 Personal history of antineoplastic chemotherapy: Secondary | ICD-10-CM | POA: Diagnosis not present

## 2014-07-17 DIAGNOSIS — Z853 Personal history of malignant neoplasm of breast: Secondary | ICD-10-CM | POA: Diagnosis not present

## 2014-07-17 NOTE — Progress Notes (Signed)
Consult Note: Gyn-Onc   Monique Watson 73 y.o. female  Chief Complaint  Patient presents with  . Ovarian Cancer     Assessment: Stage IIIc ovarian cancer 2006. Clinically free of disease.  Plan the patient return to see me in one year. She's encouraged to continue having annual mammograms.  Interval History: The patient returns today as previously scheduled for followup. Since her last visit she's done well . She specifically denies any GI or GU symptoms. She has no pelvic pain pressure vaginal bleeding or discharge. Functional status is excellent.   HPI: Patient underwent debulking for a stage IIIC ovarian cancer in June of 2006. She was optimally debulked with complete resection of all visible tumor. She then received 6 cycles of carboplatin and Taxol chemotherapy. preoperatively her CA 125 was 2665 units per mL and dropped promptly during the chemotherapy. She's been followed since that time with no evidence recurrent disease.  Review of Systems:10 point review of systems is negative as noted above.    Vitals: Blood pressure 177/76, pulse 76, temperature 98.5 F (36.9 C), temperature source Oral, resp. rate 20, height 5\' 6"  (1.676 m), weight 186 lb 8 oz (84.596 kg).  Physical Exam: General : The patient is a healthy woman in no acute distress.  HEENT: normocephalic, extraoccular movements normal; neck is supple without thyromegally  Lynphnodes: Supraclavicular and inguinal nodes not enlarged  Abdomen: Soft, non-tender, no ascites, no organomegally, no masses, no hernias  Pelvic:  EGBUS: Normal female  Vagina: Normal, no lesions  Urethra and Bladder: Normal, non-tender  Cervix: Surgically absent  Uterus: Surgically absent  Bi-manual examination: Non-tender; no adenxal masses or nodularity  Rectal: normal sphincter tone, no masses, no blood  Lower extremities: No edema or varicosities. Normal range of motion      Allergies  Allergen Reactions  . Diovan [Valsartan] Other  (See Comments)    arthralgias  . Uncoded Nonscreenable Allergen     Blood pressure medication patient stated that she has a low tolerance for    Past Medical History  Diagnosis Date  . Breast cancer 09/29/2011  . Ovarian cancer 09/29/2011  . Arthritis   . Pneumonia   . Hypertension 10/29/2011    No past surgical history on file.  Current Outpatient Prescriptions  Medication Sig Dispense Refill  . Calcium Carbonate-Vitamin D (CALCIUM 600 + D PO) Take 1 tablet by mouth 2 (two) times daily.      . Cholecalciferol (VITAMIN D3) 2000 UNITS TABS Take 1 capsule by mouth once a week.       . cloNIDine (CATAPRES) 0.1 MG tablet Take 1 tablet (0.1 mg total) by mouth at bedtime.  30 tablet  0  . docusate sodium (COLACE) 100 MG capsule Take 100 mg by mouth daily.      . ferrous fumarate (HEMOCYTE - 106 MG FE) 325 (106 FE) MG TABS Take 1 tablet by mouth every 7 (seven) days. No certain day      . ibuprofen (ADVIL,MOTRIN) 800 MG tablet Take 800 mg by mouth every 8 (eight) hours as needed. For pain      . lidocaine-prilocaine (EMLA) cream Apply 1 application topically as needed. For port      . Omega-3 Fatty Acids (FISH OIL CONCENTRATE) 1000 MG CAPS Take 1 capsule by mouth daily.      . Potassium 99 MG TABS Take 2 tablets by mouth daily.       . tamoxifen (NOLVADEX) 20 MG tablet Take 1 tablet (20 mg  total) by mouth daily.  90 tablet  0  . vitamin E (VITAMIN E) 400 UNIT capsule Take 400 Units by mouth 2 (two) times daily.       No current facility-administered medications for this visit.    History   Social History  . Marital Status: Widowed    Spouse Name: N/A    Number of Children: N/A  . Years of Education: N/A   Occupational History  . Not on file.   Social History Main Topics  . Smoking status: Current Every Day Smoker -- 0.25 packs/day for 58 years  . Smokeless tobacco: Not on file     Comment: not smoking while using nicotine patch 03-26-12  . Alcohol Use: 0.6 oz/week    1 Shots of  liquor per week  . Drug Use: No  . Sexual Activity: Yes    Birth Control/ Protection: Post-menopausal   Other Topics Concern  . Not on file   Social History Narrative  . No narrative on file    No family history on file.    CLARKE-PEARSON,Ruslan Mccabe L, MD 07/17/2014, 10:22 AM

## 2014-07-17 NOTE — Patient Instructions (Signed)
Follow up in 1 year.

## 2014-07-18 ENCOUNTER — Telehealth: Payer: Self-pay

## 2014-07-18 NOTE — Telephone Encounter (Signed)
Message copied by Baruch Merl on Sat Jul 18, 2014  2:50 PM ------      Message from: Gordy Levan      Created: Fri Jul 17, 2014 10:51 AM       Labs seen and need follow up: please let her know CA 125 is in good low range at 11 ------

## 2014-07-18 NOTE — Telephone Encounter (Signed)
Told Ms. Monique Watson the results of her ca-125 level from 07-15-14 as noted below by Dr. [name].

## 2014-07-20 ENCOUNTER — Ambulatory Visit (INDEPENDENT_AMBULATORY_CARE_PROVIDER_SITE_OTHER): Payer: Medicare Other | Admitting: Cardiology

## 2014-07-20 ENCOUNTER — Encounter: Payer: Self-pay | Admitting: Cardiology

## 2014-07-20 VITALS — BP 136/72 | HR 69 | Ht 66.0 in | Wt 186.0 lb

## 2014-07-20 DIAGNOSIS — C50912 Malignant neoplasm of unspecified site of left female breast: Secondary | ICD-10-CM | POA: Diagnosis not present

## 2014-07-20 DIAGNOSIS — R0609 Other forms of dyspnea: Secondary | ICD-10-CM

## 2014-07-20 DIAGNOSIS — R079 Chest pain, unspecified: Secondary | ICD-10-CM

## 2014-07-20 DIAGNOSIS — I10 Essential (primary) hypertension: Secondary | ICD-10-CM | POA: Diagnosis not present

## 2014-07-20 DIAGNOSIS — R0789 Other chest pain: Secondary | ICD-10-CM

## 2014-07-20 DIAGNOSIS — R0600 Dyspnea, unspecified: Secondary | ICD-10-CM | POA: Insufficient documentation

## 2014-07-20 DIAGNOSIS — Z720 Tobacco use: Secondary | ICD-10-CM

## 2014-07-20 NOTE — Patient Instructions (Signed)
Your physician recommends that you continue on your current medications as directed. Please refer to the Current Medication list given to you today.  Your physician has requested that you have a lexiscan myoview. For further information please visit [email or web address]. Please follow instruction sheet, as given.  Follow up as needed  

## 2014-07-20 NOTE — Progress Notes (Signed)
Monique Watson Date of Birth:  11/27/1940 [facility] [address] [place], [place]  [address] [phone]        Fax   [phone]   History of Present Illness: This pleasant 73 year old African-American woman is seen by me for the first time today.  She is a medical patient of Dr. [name] and Dr. [name] is her oncologist.  The patient is being seen because of an episode of chest discomfort which occurred approximately a month ago.  She was riding in a car as a passenger.  She began experiencing substernal chest discomfort associated with tingling of her right hand.  She became nauseated but did not vomit.  She does not recall being short of breath.  She was quite concerned and telephoned her daughter instructing her not to leave the house until the patient arrived.  After arrival at home the pain persisted for a while.  She did take an antiacid with eventual relief.  She has not had any subsequent episodes. The patient is not diabetic.  She has a history of hypertension.  She is a current smoker trying to quit.she does not have any history of hypercholesterolemia. Last chest x-ray was in 2013 and showed normal heart size at that time. The family history is negative for known premature coronary disease.  Her mother died of ovarian cancer.  Her father died of unknown cause. Her medical history is of note in that she has a past history of multifocal left breast cancer and she will continue tamoxifen through August 2016.  She also has a history of stage III C ovarian carcinoma and is now about 9 years clinically doing well.  Current Outpatient Prescriptions  Medication Sig Dispense Refill  . Calcium Carbonate-Vitamin D (CALCIUM 600 + D PO) Take 1 tablet by mouth 2 (two) times daily.    . Cholecalciferol (VITAMIN D3) 2000 UNITS TABS Take 1 capsule by mouth once a week.     . cloNIDine (CATAPRES) 0.1 MG tablet Take 1 tablet (0.1 mg total) by mouth at bedtime. 30  tablet 0  . docusate sodium (COLACE) 100 MG capsule Take 100 mg by mouth daily.    . ferrous fumarate (HEMOCYTE - 106 MG FE) 325 (106 FE) MG TABS Take 1 tablet by mouth every 7 (seven) days. No certain day    . ibuprofen (ADVIL,MOTRIN) 800 MG tablet Take 800 mg by mouth every 8 (eight) hours as needed. For pain    . lidocaine-prilocaine (EMLA) cream Apply 1 application topically as needed. For port    . Omega-3 Fatty Acids (FISH OIL CONCENTRATE) 1000 MG CAPS Take 1 capsule by mouth daily.    . Potassium 99 MG TABS Take 2 tablets by mouth daily.     . tamoxifen (NOLVADEX) 20 MG tablet Take 1 tablet (20 mg total) by mouth daily. 90 tablet 0  . vitamin E (VITAMIN E) 400 UNIT capsule Take 400 Units by mouth 2 (two) times daily.     No current facility-administered medications for this visit.    Allergies  Allergen Reactions  . Diovan [Valsartan] Other (See Comments)    arthralgias  . Uncoded Nonscreenable Allergen     Blood pressure medication patient stated that she has a low tolerance for    Patient Active Problem List   Diagnosis Date Noted  . DOE (dyspnea on exertion) 07/20/2014  . Breast cancer, left breast 11/15/2012  . SOB (shortness of breath) 10/29/2011  . Pneumonitis  due to fumes and vapors 10/29/2011  . Acute bronchitis 10/29/2011  . Tobacco use disorder 10/29/2011  . Hypertension 10/29/2011  . Breast cancer 09/29/2011  . Ovarian cancer 09/29/2011    History  Smoking status  . Current Every Day Smoker -- 0.25 packs/day for 58 years  Smokeless tobacco  . Not on file    Comment: not smoking while using nicotine patch 03-26-12    History  Alcohol Use  . 0.6 oz/week  . 1 Shots of liquor per week    No family history on file.  Review of Systems: Constitutional: no fever chills diaphoresis or fatigue or change in weight.  Head and neck: no hearing loss, no epistaxis, no photophobia or visual disturbance. Respiratory: positive for exertional dyspnea Cardiovascular:  No chest pain peripheral edema, palpitations. Gastrointestinal: No abdominal distention, no abdominal pain, no change in bowel habits hematochezia or melena. Genitourinary: No dysuria, no frequency, no urgency, no nocturia. Musculoskeletal:No arthralgias, no back pain, no gait disturbance or myalgias. Neurological: No dizziness, no headaches, no numbness, no seizures, no syncope, no weakness, no tremors. Hematologic: No lymphadenopathy, no easy bruising. Psychiatric: No confusion, no hallucinations, no sleep disturbance.    Physical Exam: Filed Vitals:   07/20/14 1009  BP: 136/72  Pulse: 69  The patient appears to be in no distress.  Head and neck exam reveals that the pupils are equal and reactive.  The extraocular movements are full.  There is no scleral icterus.  Mouth and pharynx are benign.  No lymphadenopathy.  No carotid bruits.  The jugular venous pressure is normal.  Thyroid is not enlarged or tender.  Chest is clear to percussion and auscultation.  No rales or rhonchi.  Expansion of the chest is symmetrical.she has a Port-A-Cath in the right anterior chest  Heart reveals no abnormal lift or heave.  First and second heart sounds are normal.  There is no murmur gallop rub or click.  The abdomen is soft and nontender.  Bowel sounds are normoactive.  There is no hepatosplenomegaly or mass.  There are no abdominal bruits.  Extremities reveal no phlebitis or edema.  Pedal pulses are good.  There is no cyanosis or clubbing.  Neurologic exam is normal strength and no lateralizing weakness.  No sensory deficits.  Integument reveals no rash  EKG shows normal sinus rhythm and prominent R waves in the right precordial leads.  Assessment / Plan: 1. Chest pain, uncertain etiology 2.  Risk factors for ischemic heart disease include ongoing smoking, and history of hypertension. 3.  Dyspnea on exertion 4.  Tobacco abuse  Disposition: I have asked the patient to return for a Lexiscan  Myoview stress test. I have encouraged the patient to stop smoking completely. Many thanks for the opportunity to see this pleasant woman with you.  I will be in touch with you regarding the results of the stress test Return here when necessary.

## 2014-07-29 ENCOUNTER — Ambulatory Visit (HOSPITAL_COMMUNITY): Payer: Medicare Other | Attending: Internal Medicine | Admitting: Radiology

## 2014-07-29 DIAGNOSIS — R079 Chest pain, unspecified: Secondary | ICD-10-CM | POA: Insufficient documentation

## 2014-07-29 DIAGNOSIS — I10 Essential (primary) hypertension: Secondary | ICD-10-CM | POA: Insufficient documentation

## 2014-07-29 DIAGNOSIS — R0609 Other forms of dyspnea: Secondary | ICD-10-CM | POA: Insufficient documentation

## 2014-07-29 DIAGNOSIS — R0789 Other chest pain: Secondary | ICD-10-CM | POA: Diagnosis not present

## 2014-07-29 DIAGNOSIS — R0600 Dyspnea, unspecified: Secondary | ICD-10-CM

## 2014-07-29 MED ORDER — TECHNETIUM TC 99M SESTAMIBI GENERIC - CARDIOLITE
33.0000 | Freq: Once | INTRAVENOUS | Status: AC | PRN
  Administered 2014-07-29: 33 via INTRAVENOUS

## 2014-07-29 MED ORDER — REGADENOSON 0.4 MG/5ML IV SOLN
0.4000 mg | Freq: Once | INTRAVENOUS | Status: AC
  Administered 2014-07-29: 0.4 mg via INTRAVENOUS

## 2014-07-29 MED ORDER — TECHNETIUM TC 99M SESTAMIBI GENERIC - CARDIOLITE
11.0000 | Freq: Once | INTRAVENOUS | Status: AC | PRN
Start: 2014-07-29 — End: 2014-07-29
  Administered 2014-07-29: 11 via INTRAVENOUS

## 2014-07-29 NOTE — Progress Notes (Signed)
[name] [address] [place], [place] [address] [phone]    Cardiology Nuclear Med Study  Monique Watson is a 73 y.o. female     MRN : [id number]     DOB: 12-Dec-1940  Procedure Date: 07/29/2014  Nuclear Med Background Indication for Stress Test:  Evaluation for Ischemia History:  Breast CA '10 Cardiac Risk Factors: Hypertension  Symptoms:  Chest Pain and DOE   Nuclear Pre-Procedure Caffeine/Decaff Intake:  None> 12 hrs NPO After: 7:00pm   Lungs:  clear O2 Sat: 95% on room air. IV 0.9% NS with Angio Cath:  22g  IV Site: R Antecubital x 1, tolerated well IV Started by:  Irven Baltimore, RN  Chest Size (in):  36 Cup Size: B  Height: 5\' 6"  (1.676 m)  Weight:  184 lb (83.462 kg)  BMI:  Body mass index is 29.71 kg/(m^2). Tech Comments:  Patient took Clonidine this am. Irven Baltimore, RN.    Nuclear Med Study 1 or 2 day study: 1 day  Stress Test Type:  [name]  Reading MD: N/A  Order Authorizing Provider:  Darlin Coco, MD  Resting Radionuclide: Technetium 27m Sestamibi  Resting Radionuclide Dose: 11.0 mCi   Stress Radionuclide:  Technetium 32m Sestamibi  Stress Radionuclide Dose: 33.0 mCi           Stress Protocol Rest HR: 64 Stress HR: 96  Rest BP: 158/58 Stress BP: 187/83  Exercise Time (min): n/a METS: n/a   Predicted Max HR: 145 bpm % Max HR: 66.21 bpm Rate Pressure Product: 17952   Dose of Adenosine (mg):  n/a Dose of Lexiscan: 0.4 mg  Dose of Atropine (mg): n/a Dose of Dobutamine: n/a mcg/kg/min (at max HR)  Stress Test Technologist: Perrin Maltese, EMT-P  Nuclear Technologist:  Earl Many, CNMT     Rest Procedure:  Myocardial perfusion imaging was performed at rest 45 minutes following the intravenous administration of Technetium 28m Sestamibi. Rest ECG: NSR - Normal EKG  Stress Procedure:  The patient received IV Lexiscan 0.4 mg over 15-seconds.  Technetium 50m Sestamibi injected at 30-seconds. This patient had  sob and felt weird with the Lexiscan injection. Quantitative spect images were obtained after a 45 minute delay. Stress ECG: No significant change from baseline ECG  QPS Raw Data Images:  Normal; no motion artifact; normal heart/lung ratio. Stress Images:  Small, mild mid anterior perfusion defect. Rest Images:  Small, mild mid anterior perfusion defect. Subtraction (SDS):  Fixed small, mild mid anterior perfusion defect. Transient Ischemic Dilatation (Normal <1.22):  1.05 Lung/Heart Ratio (Normal <0.45):  0.27  Quantitative Gated Spect Images QGS EDV:  80 ml QGS ESV:  32 ml  Impression Exercise Capacity:  Lexiscan with no exercise. BP Response:  Hypertensive blood pressure response. Clinical Symptoms:  Dyspnea ECG Impression:  No significant ST segment change suggestive of ischemia. Comparison with Prior Nuclear Study: No images to compare  Overall Impression:  Low risk stress nuclear study with a fixed, small mild mid anterior perfusion defect that is likely due to soft tissue attenuation given normal wall motion.  No ischemia. .  LV Ejection Fraction: 61%.  LV Wall Motion:  NL LV Function; NL Wall Motion   Loralie Champagne 07/29/2014

## 2014-07-30 ENCOUNTER — Telehealth: Payer: Self-pay | Admitting: *Deleted

## 2014-07-30 NOTE — Telephone Encounter (Signed)
Advised patient of results.  

## 2014-07-30 NOTE — Telephone Encounter (Signed)
-----   Message from Darlin Coco, MD sent at 07/30/2014  1:54 PM EST ----- Please report.  Stress test normal. EF normal. No ischemia.  Send report to Dr. Drema Dallas and Dr. [name]

## 2014-08-14 ENCOUNTER — Telehealth: Payer: Self-pay | Admitting: Oncology

## 2014-08-31 ENCOUNTER — Other Ambulatory Visit: Payer: Medicare Other

## 2014-08-31 ENCOUNTER — Ambulatory Visit (HOSPITAL_BASED_OUTPATIENT_CLINIC_OR_DEPARTMENT_OTHER): Payer: Medicare Other | Admitting: Genetic Counselor

## 2014-08-31 ENCOUNTER — Encounter: Payer: Self-pay | Admitting: Genetic Counselor

## 2014-08-31 DIAGNOSIS — Z803 Family history of malignant neoplasm of breast: Secondary | ICD-10-CM | POA: Diagnosis not present

## 2014-08-31 DIAGNOSIS — C569 Malignant neoplasm of unspecified ovary: Secondary | ICD-10-CM | POA: Diagnosis not present

## 2014-08-31 DIAGNOSIS — Z8041 Family history of malignant neoplasm of ovary: Secondary | ICD-10-CM

## 2014-08-31 DIAGNOSIS — Z315 Encounter for genetic counseling: Secondary | ICD-10-CM

## 2014-08-31 DIAGNOSIS — Z800 Family history of malignant neoplasm of digestive organs: Secondary | ICD-10-CM

## 2014-08-31 DIAGNOSIS — C50912 Malignant neoplasm of unspecified site of left female breast: Secondary | ICD-10-CM | POA: Diagnosis not present

## 2014-08-31 NOTE — Progress Notes (Signed)
REFERRING PROVIDER: Gerrit Heck, MD [place], [place] [address]   Evlyn Clines, MD [employer].  [place], [place] [address]  PRIMARY PROVIDER:  Gerrit Heck, MD  PRIMARY REASON FOR VISIT:  1. Breast cancer, left breast   2. Ovarian cancer, unspecified laterality   3. Family history of ovarian cancer   4. Family history of colon cancer   5. Family history of breast cancer      HISTORY OF PRESENT ILLNESS:   Ms. Trostel, a 73 y.o. female, was seen for a [place] cancer genetics consultation at the request of Dr. [name] due to a personal and family history of cancer.  Ms. Fairburn presents to clinic today to discuss the possibility of a hereditary predisposition to cancer, genetic testing, and to further clarify her future cancer risks, as well as potential cancer risks for family members.   CANCER HISTORY:  Oncology History   Multifocal T1N0 left breast at lumpectomy and sentinel node evaluation March 2010. ER PR +, Her2 negative. Treated with RT and hormonal blockade     Breast cancer   09/29/2011 Initial Diagnosis Breast cancer     HISTORY OF PRESENT ILLNESS: In 2006, at the age of 60, Ms. Dinius was diagnosed with ovarian cancer. This was treated with surgery and chemotherapy.  In 2010, at the age of 37, Ms. 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  The tumor was ER+/PR+/Her2-.  This was treated with lumpectomy and radiation.   Ms. Vinje has had several colonoscopies which have found both tubular adenoma's and hyperplastic polyps.  She is on a 3-5 year colonoscopy schedule.  She is here on the referral of Dr. [name] to determine if the cancer found in Ms. Cumming, as well as that of her family, is the result of a hereditary cancer syndrome.    HORMONAL RISK FACTORS:  Menarche was at age 61.  First live birth at age 37.  OCP use for approximately 20 years.  Ovaries intact: no.  Hysterectomy: yes.  Menopausal status:  postmenopausal.  HRT use: "multiple" years. Colonoscopy: yes; abnormal. Mammogram within the last year: yes. Number of breast biopsies: 3. Up to date with pelvic exams:  yes. Any excessive radiation exposure in the past:  no  Past Medical History  Diagnosis Date  . Breast cancer 09/29/2011  . Ovarian cancer 09/29/2011  . Arthritis   . Pneumonia   . Hypertension 10/29/2011    History reviewed. No pertinent past surgical history.  History   Social History  . Marital Status: Widowed    Spouse Name: N/A    Number of Children: 2  . Years of Education: N/A   Social History Main Topics  . Smoking status: Current Every Day Smoker -- 0.25 packs/day for 58 years  . Smokeless tobacco: None     Comment: not smoking while using nicotine patch 03-26-12  . Alcohol Use: 0.6 oz/week    1 Shots of liquor per week  . Drug Use: No  . Sexual Activity: Yes    Birth Control/ Protection: Post-menopausal   Other Topics Concern  . None   Social History Narrative     FAMILY HISTORY:  We obtained a detailed, 4-generation family history.  Significant diagnoses are listed below: Family History  Problem Relation Age of Onset  . Ovarian cancer Mother 83  . Colon cancer Brother 67  . Breast cancer Paternal Aunt     6-7 with breast cancer; one bilateral,  several premenopausal  . Pancreatitis Brother 32  . Colon cancer Paternal Aunt   . Cancer Cousin     NOS   Ms. Mezquita comes from a large family.  She has two full brothers, and four maternal half brothers and two maternal half sisters.  Her mother and father each had 50 brothers and sisters.  Several of her mother's siblings died in their 37s and those that are living are in their 80s-90s.  Her father had 9 sisters.  One had colon cancer, 6-7 had breast cancer and one died of old age.  Those with breast cancer, some had premenopausal breast cancer and one had bilateral breast cancer.    Patient's maternal ancestors are of African American descent,  and paternal ancestors are of African American descent. There is no reported Ashkenazi Jewish ancestry. There is no known consanguinity.  GENETIC COUNSELING ASSESSMENT: KEIANA TAVELLA is a 73 y.o. female with a personal history of breast and ovarian cancer and family history of breast, ovarian and colon cancer which somewhat suggestive of a hereditary cancer syndrome and predisposition to cancer. We, therefore, discussed and recommended the following at today's visit.   DISCUSSION: We reviewed the characteristics, features and inheritance patterns of hereditary cancer syndromes. We also discussed genetic testing, including the appropriate family members to test, the process of testing, insurance coverage and turn-around-time for results. Based on Ms. Leib's personal and family history of cancer we reviewed several hereditary cancer syndromes including BRCA mutations as well as Lynch syndrome.  We discussed the implications of a negative, positive and/or variant of uncertain significant result. Based on her test results, we may recommend testing for other family members including her children, siblings and nieces/nephews.  We recommended Ms. Ishee pursue genetic testing for the [facility]vaNext gene panel.  The genes on this test include: ATM, BARD1, BRCA1, BRCA2, BRIP1, CDH1, CHEK2, EPCAM, MLH1, MRE11A, MSH2, MSH6, MUTYH, NBN, NF1, PALB2, PMS2, PTEN, RAD50, RAD51C, RAD51D, SMARCA4, STK11, and TP53.    PLAN: After considering the risks, benefits, and limitations,Ms. Puig  provided informed consent to pursue genetic testing and the blood sample was sent to [facility] for analysis of the [place]. Results should be available within approximately 3-4 weeks' time, at which point they will be disclosed by telephone to Ms. Fromme, as will any additional recommendations warranted by these results. Ms. Alperin will receive a summary of her genetic counseling visit and a copy of her results once available. This  information will also be available in Epic. We encouraged Ms. Speyer to remain in contact with cancer genetics annually so that we can continuously update the family history and inform her of any changes in cancer genetics and testing that may be of benefit for her family. Ms. Arrey questions were answered to her satisfaction today. Our contact information was provided should additional questions or concerns arise.  Lastly, we encouraged Ms. Kapral to remain in contact with cancer genetics annually so that we can continuously update the family history and inform her of any changes in cancer genetics and testing that may be of benefit for this family.   Ms.  Majewski questions were answered to her satisfaction today. Our contact information was provided should additional questions or concerns arise. Thank you for the referral and allowing [place] to share in the care of your patient.   Glenys Snader P. Florene Glen, [place], [facility] Certified Genetic Counselor [place] [email or web address] phone: [phone]  The patient was seen for a total of 55 minutes in face-to-face genetic  counseling.  This patient was discussed with Drs. Magrinat, Lindi Adie and/or Burr Medico who agrees with the above.    _______________________________________________________________________ For Office Staff:  Number of people involved in session: 1 Was an Intern/ student involved with case: no

## 2014-10-07 ENCOUNTER — Encounter: Payer: Self-pay | Admitting: Genetic Counselor

## 2014-10-07 ENCOUNTER — Telehealth: Payer: Self-pay | Admitting: Genetic Counselor

## 2014-10-07 DIAGNOSIS — Z1509 Genetic susceptibility to other malignant neoplasm: Secondary | ICD-10-CM

## 2014-10-07 DIAGNOSIS — Z1501 Genetic susceptibility to malignant neoplasm of breast: Secondary | ICD-10-CM | POA: Insufficient documentation

## 2014-10-07 DIAGNOSIS — Z1379 Encounter for other screening for genetic and chromosomal anomalies: Secondary | ICD-10-CM | POA: Insufficient documentation

## 2014-10-07 NOTE — Telephone Encounter (Signed)
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  Monique Watson is sick and needs to see her PCP.  She would like to schedule next week for results visit.  Scheduled for Monday at 3 PM.

## 2014-10-08 ENCOUNTER — Inpatient Hospital Stay (HOSPITAL_COMMUNITY)
Admission: EM | Admit: 2014-10-08 | Discharge: 2014-10-11 | DRG: 195 | Disposition: A | Discharge status: Home Health | Payer: Medicare Other | Attending: Family Medicine | Admitting: Family Medicine

## 2014-10-08 ENCOUNTER — Encounter (HOSPITAL_COMMUNITY): Payer: Self-pay

## 2014-10-08 DIAGNOSIS — C50912 Malignant neoplasm of unspecified site of left female breast: Secondary | ICD-10-CM | POA: Diagnosis present

## 2014-10-08 DIAGNOSIS — J189 Pneumonia, unspecified organism: Principal | ICD-10-CM

## 2014-10-08 DIAGNOSIS — C50919 Malignant neoplasm of unspecified site of unspecified female breast: Secondary | ICD-10-CM | POA: Diagnosis not present

## 2014-10-08 DIAGNOSIS — D630 Anemia in neoplastic disease: Secondary | ICD-10-CM | POA: Diagnosis present

## 2014-10-08 DIAGNOSIS — R7302 Impaired glucose tolerance (oral): Secondary | ICD-10-CM | POA: Diagnosis present

## 2014-10-08 DIAGNOSIS — Z8543 Personal history of malignant neoplasm of ovary: Secondary | ICD-10-CM | POA: Diagnosis not present

## 2014-10-08 DIAGNOSIS — E876 Hypokalemia: Secondary | ICD-10-CM | POA: Diagnosis present

## 2014-10-08 DIAGNOSIS — Z803 Family history of malignant neoplasm of breast: Secondary | ICD-10-CM | POA: Diagnosis not present

## 2014-10-08 DIAGNOSIS — Z8041 Family history of malignant neoplasm of ovary: Secondary | ICD-10-CM | POA: Diagnosis not present

## 2014-10-08 DIAGNOSIS — J449 Chronic obstructive pulmonary disease, unspecified: Secondary | ICD-10-CM | POA: Diagnosis present

## 2014-10-08 DIAGNOSIS — R062 Wheezing: Secondary | ICD-10-CM | POA: Diagnosis not present

## 2014-10-08 DIAGNOSIS — Z79899 Other long term (current) drug therapy: Secondary | ICD-10-CM | POA: Diagnosis not present

## 2014-10-08 DIAGNOSIS — I10 Essential (primary) hypertension: Secondary | ICD-10-CM | POA: Diagnosis not present

## 2014-10-08 DIAGNOSIS — R0602 Shortness of breath: Secondary | ICD-10-CM | POA: Diagnosis not present

## 2014-10-08 DIAGNOSIS — Z87891 Personal history of nicotine dependence: Secondary | ICD-10-CM

## 2014-10-08 DIAGNOSIS — Z79810 Long term (current) use of selective estrogen receptor modulators (SERMs): Secondary | ICD-10-CM | POA: Diagnosis not present

## 2014-10-08 DIAGNOSIS — R079 Chest pain, unspecified: Secondary | ICD-10-CM | POA: Diagnosis not present

## 2014-10-08 LAB — BRAIN NATRIURETIC PEPTIDE: B Natriuretic Peptide: 71.9 pg/mL (ref 0.0–100.0)

## 2014-10-08 LAB — COMPREHENSIVE METABOLIC PANEL
ALT: 15 U/L (ref 0–35)
AST: 21 U/L (ref 0–37)
Albumin: 2.7 g/dL — ABNORMAL LOW (ref 3.5–5.2)
Alkaline Phosphatase: 51 U/L (ref 39–117)
Anion gap: 8 (ref 5–15)
BUN: 9 mg/dL (ref 6–23)
CO2: 25 mmol/L (ref 19–32)
Calcium: 7.9 mg/dL — ABNORMAL LOW (ref 8.4–10.5)
Chloride: 106 mEq/L (ref 96–112)
Creatinine, Ser: 0.73 mg/dL (ref 0.50–1.10)
GFR calc Af Amer: >90 mL/min (ref >90)
GFR calc non Af Amer: 82 mL/min — ABNORMAL LOW (ref >90)
Glucose, Bld: 153 mg/dL — ABNORMAL HIGH (ref 70–99)
Potassium: 2.4 mmol/L — CL (ref 3.5–5.1)
Sodium: 139 mmol/L (ref 135–145)
Total Bilirubin: 0.4 mg/dL (ref 0.3–1.2)
Total Protein: 6.9 g/dL (ref 6.0–8.3)

## 2014-10-08 LAB — CBC WITH DIFFERENTIAL/PLATELET
Basophils Absolute: 0 10*3/uL (ref 0.0–0.1)
Basophils Relative: 0 % (ref 0–1)
Eosinophils Absolute: 0 10*3/uL (ref 0.0–0.7)
Eosinophils Relative: 0 % (ref 0–5)
HCT: 26.6 % — ABNORMAL LOW (ref 36.0–46.0)
Hemoglobin: 9.2 g/dL — ABNORMAL LOW (ref 12.0–15.0)
Lymphocytes Relative: 15 % (ref 12–46)
Lymphs Abs: 1.7 10*3/uL (ref 0.7–4.0)
MCH: 33.6 pg (ref 26.0–34.0)
MCHC: 34.6 g/dL (ref 30.0–36.0)
MCV: 97.1 fL (ref 78.0–100.0)
Monocytes Absolute: 0.6 10*3/uL (ref 0.1–1.0)
Monocytes Relative: 5 % (ref 3–12)
Neutro Abs: 8.5 10*3/uL — ABNORMAL HIGH (ref 1.7–7.7)
Neutrophils Relative %: 79 % — ABNORMAL HIGH (ref 43–77)
Platelets: 170 10*3/uL (ref 150–400)
RBC: 2.74 MIL/uL — ABNORMAL LOW (ref 3.87–5.11)
RDW: 13.3 % (ref 11.5–15.5)
WBC: 10.8 10*3/uL — ABNORMAL HIGH (ref 4.0–10.5)

## 2014-10-08 LAB — I-STAT CHEM 8, ED
BUN: 11 mg/dL (ref 6–23)
Calcium, Ion: 1.09 mmol/L — ABNORMAL LOW (ref 1.13–1.30)
Chloride: 104 mEq/L (ref 96–112)
Creatinine, Ser: 0.8 mg/dL (ref 0.50–1.10)
Glucose, Bld: 110 mg/dL — ABNORMAL HIGH (ref 70–99)
HCT: 33 % — ABNORMAL LOW (ref 36.0–46.0)
Hemoglobin: 11.2 g/dL — ABNORMAL LOW (ref 12.0–15.0)
Potassium: 3.3 mmol/L — ABNORMAL LOW (ref 3.5–5.1)
Sodium: 140 mmol/L (ref 135–145)
TCO2: 23 mmol/L (ref 0–100)

## 2014-10-08 LAB — I-STAT TROPONIN, ED: Troponin i, poc: 0 ng/mL (ref 0.00–0.08)

## 2014-10-08 LAB — LACTIC ACID, PLASMA: Lactic Acid, Venous: 1.3 mmol/L (ref 0.5–2.0)

## 2014-10-08 LAB — D-DIMER, QUANTITATIVE: D-Dimer, Quant: 1.03 ug/mL-FEU — ABNORMAL HIGH (ref 0.00–0.48)

## 2014-10-08 MED ORDER — DEXTROSE 5 % IV SOLN
500.0000 mg | INTRAVENOUS | Status: DC
  Administered 2014-10-09: 500 mg via INTRAVENOUS
  Filled 2014-10-08 (×2): qty 500

## 2014-10-08 MED ORDER — DOCUSATE SODIUM 100 MG PO CAPS
100.0000 mg | ORAL_CAPSULE | Freq: Every day | ORAL | Status: DC
  Administered 2014-10-09 – 2014-10-11 (×3): 100 mg via ORAL
  Filled 2014-10-08 (×3): qty 1

## 2014-10-08 MED ORDER — OMEGA-3-ACID ETHYL ESTERS 1 G PO CAPS
1000.0000 mg | ORAL_CAPSULE | Freq: Every day | ORAL | Status: DC
  Administered 2014-10-09 – 2014-10-11 (×3): 1000 mg via ORAL
  Filled 2014-10-08 (×3): qty 1

## 2014-10-08 MED ORDER — FERROUS FUMARATE 325 (106 FE) MG PO TABS
1.0000 | ORAL_TABLET | ORAL | Status: DC
  Administered 2014-10-09: 106 mg via ORAL
  Filled 2014-10-08 (×2): qty 1

## 2014-10-08 MED ORDER — TAMOXIFEN CITRATE 10 MG PO TABS
20.0000 mg | ORAL_TABLET | Freq: Every day | ORAL | Status: DC
Start: 2014-10-09 — End: 2014-10-11
  Administered 2014-10-09 – 2014-10-11 (×3): 20 mg via ORAL
  Filled 2014-10-08 (×4): qty 2

## 2014-10-08 MED ORDER — ALBUTEROL SULFATE (2.5 MG/3ML) 0.083% IN NEBU
5.0000 mg | INHALATION_SOLUTION | Freq: Once | RESPIRATORY_TRACT | Status: AC
  Administered 2014-10-08: 5 mg via RESPIRATORY_TRACT
  Filled 2014-10-08: qty 6

## 2014-10-08 MED ORDER — POTASSIUM CHLORIDE 10 MEQ/100ML IV SOLN
10.0000 meq | INTRAVENOUS | Status: AC
  Administered 2014-10-09 (×5): 10 meq via INTRAVENOUS
  Filled 2014-10-08 (×5): qty 100

## 2014-10-08 MED ORDER — SODIUM CHLORIDE 0.9 % IJ SOLN
3.0000 mL | Freq: Two times a day (BID) | INTRAMUSCULAR | Status: DC
Start: 2014-10-08 — End: 2014-10-11
  Administered 2014-10-08 – 2014-10-10 (×2): 3 mL via INTRAVENOUS

## 2014-10-08 MED ORDER — ENOXAPARIN SODIUM 40 MG/0.4ML ~~LOC~~ SOLN
40.0000 mg | Freq: Every day | SUBCUTANEOUS | Status: DC
  Administered 2014-10-08 – 2014-10-09 (×2): 40 mg via SUBCUTANEOUS
  Filled 2014-10-08 (×3): qty 0.4

## 2014-10-08 MED ORDER — IPRATROPIUM BROMIDE 0.02 % IN SOLN
0.5000 mg | Freq: Once | RESPIRATORY_TRACT | Status: AC
  Administered 2014-10-08: 0.5 mg via RESPIRATORY_TRACT
  Filled 2014-10-08: qty 2.5

## 2014-10-08 MED ORDER — AZITHROMYCIN 250 MG PO TABS
500.0000 mg | ORAL_TABLET | Freq: Once | ORAL | Status: AC
  Administered 2014-10-08: 500 mg via ORAL
  Filled 2014-10-08: qty 2

## 2014-10-08 MED ORDER — CEFTRIAXONE SODIUM IN DEXTROSE 20 MG/ML IV SOLN
1.0000 g | INTRAVENOUS | Status: DC
  Administered 2014-10-09: 1 g via INTRAVENOUS
  Filled 2014-10-08 (×2): qty 50

## 2014-10-08 MED ORDER — DEXTROSE 5 % IV SOLN
1.0000 g | Freq: Once | INTRAVENOUS | Status: AC
  Administered 2014-10-08: 1 g via INTRAVENOUS
  Filled 2014-10-08: qty 10

## 2014-10-08 MED ORDER — VITAMIN D 1000 UNITS PO TABS
1000.0000 [IU] | ORAL_TABLET | ORAL | Status: DC
Start: 2014-10-09 — End: 2014-10-11
  Administered 2014-10-09: 1000 [IU] via ORAL
  Filled 2014-10-08: qty 1

## 2014-10-08 MED ORDER — CLONIDINE HCL 0.1 MG PO TABS
0.1000 mg | ORAL_TABLET | Freq: Every day | ORAL | Status: DC
  Administered 2014-10-08 – 2014-10-10 (×3): 0.1 mg via ORAL
  Filled 2014-10-08 (×4): qty 1

## 2014-10-08 NOTE — ED Notes (Signed)
Dr. Hal Hope at bedside, has [facility] chest x-ray reading while pt being transported to floor.

## 2014-10-08 NOTE — ED Provider Notes (Signed)
CSN: [id number]     Arrival date & time 10/08/14  1813 History   First MD Initiated Contact with Patient 10/08/14 [place]     Chief Complaint  Patient presents with  . Pneumonia     (Consider location/radiation/quality/duration/timing/severity/associated sxs/prior Treatment) Patient is a 74 y.o. female presenting with pneumonia. The history is provided by the patient.  Pneumonia This is a new problem. Episode onset: 3 weeks. The problem occurs constantly. The problem has been gradually worsening. Associated symptoms include chest pain and shortness of breath. Pertinent negatives include no abdominal pain. Associated symptoms comments: Occasional nausea but no vomiting. Fever up to 101. Productive cough with green and yellow sputum. No hematemesis. No abdominal pain or urinary symptoms. Shortness of breath is with activity. The symptoms are aggravated by walking. The symptoms are relieved by rest. She has tried rest for the symptoms. The treatment provided no relief.    Past Medical History  Diagnosis Date  . Breast cancer 09/29/2011  . Ovarian cancer 09/29/2011  . Arthritis   . Pneumonia   . Hypertension 10/29/2011   Past Surgical History  Procedure Laterality Date  . Abdominal hysterectomy    . Breast lumpectomy    . Tonsillectomy    . Appendectomy     Family History  Problem Relation Age of Onset  . Ovarian cancer Mother 74  . Colon cancer Brother 68  . Breast cancer Paternal Aunt     6-7 with breast cancer; one bilateral, several premenopausal  . Pancreatitis Brother 54  . Colon cancer Paternal Aunt   . Cancer Cousin     NOS   History  Substance Use Topics  . Smoking status: Former Smoker -- 0.25 packs/day for 58 years  . Smokeless tobacco: Not on file     Comment: not smoking while using nicotine patch 03-26-12  . Alcohol Use: 0.6 oz/week    1 Shots of liquor per week   OB History    No data available     Review of Systems  Constitutional: Positive for fever and  chills.  Respiratory: Positive for shortness of breath.   Cardiovascular: Positive for chest pain.       Sharp chest pain with breathing  Gastrointestinal: Positive for nausea. Negative for vomiting, abdominal pain and diarrhea.  All other systems reviewed and are negative.     Allergies  Arimidex; Diovan; and Uncoded nonscreenable allergen  Home Medications   Prior to Admission medications   Medication Sig Start Date End Date Taking? Authorizing Provider  Calcium Carbonate-Vitamin D (CALCIUM 600 + D PO) Take 1 tablet by mouth 2 (two) times daily.    Historical Provider, MD  Cholecalciferol (VITAMIN D3) 2000 UNITS TABS Take 1 capsule by mouth once a week.     Historical Provider, MD  cloNIDine (CATAPRES) 0.1 MG tablet Take 1 tablet (0.1 mg total) by mouth at bedtime. 07/06/14   Lennis Marion Downer, MD  docusate sodium (COLACE) 100 MG capsule Take 100 mg by mouth daily.    Historical Provider, MD  ferrous fumarate (HEMOCYTE - 106 MG FE) 325 (106 FE) MG TABS Take 1 tablet by mouth every 7 (seven) days. No certain day    Historical Provider, MD  ibuprofen (ADVIL,MOTRIN) 800 MG tablet Take 800 mg by mouth every 8 (eight) hours as needed. For pain    Historical Provider, MD  lidocaine-prilocaine (EMLA) cream Apply 1 application topically as needed. For port    Historical Provider, MD  Omega-3 Fatty Acids (FISH OIL  CONCENTRATE) 1000 MG CAPS Take 1 capsule by mouth daily.    Historical Provider, MD  Potassium 99 MG TABS Take 2 tablets by mouth daily.     Historical Provider, MD  tamoxifen (NOLVADEX) 20 MG tablet Take 1 tablet (20 mg total) by mouth daily. 07/06/14   Lennis Marion Downer, MD  vitamin E (VITAMIN E) 400 UNIT capsule Take 400 Units by mouth 2 (two) times daily.    Historical Provider, MD   BP 177/83 mmHg  Pulse 93  Temp(Src) 98.9 F (37.2 C) (Oral)  Resp 24  SpO2 98% Physical Exam  Constitutional: She is oriented to person, place, and time. She appears well-developed and  well-nourished. She appears distressed.  HENT:  Head: Normocephalic and atraumatic.  Eyes: EOM are normal. Pupils are equal, round, and reactive to light.  Cardiovascular: Normal rate, regular rhythm, normal heart sounds and intact distal pulses.  Exam reveals no friction rub.   No murmur heard. Pulmonary/Chest: Effort normal. Tachypnea noted. She has wheezes. She has rhonchi. She has no rales.  Abdominal: Soft. Bowel sounds are normal. She exhibits no distension. There is no tenderness. There is no rebound and no guarding.  Musculoskeletal: Normal range of motion. She exhibits no tenderness.  No edema  Neurological: She is alert and oriented to person, place, and time. No cranial nerve deficit.  Skin: Skin is warm and dry. No rash noted.  Psychiatric: She has a normal mood and affect. Her behavior is normal.  Nursing note and vitals reviewed.   ED Course  Procedures (including critical care time) Labs Review Labs Reviewed  I-STAT CHEM 8, ED - Abnormal; Notable for the following:    Potassium 3.3 (*)    Glucose, Bld 110 (*)    Calcium, Ion 1.09 (*)    Hemoglobin 11.2 (*)    HCT 33.0 (*)    All other components within normal limits  I-STAT TROPOININ, ED    Imaging Review No results found.   EKG Interpretation   Date/Time:  Thursday October 08 2014 18:54:41 EST Ventricular Rate:  91 PR Interval:  128 QRS Duration: 95 QT Interval:  371 QTC Calculation: 456 R Axis:   4 Text Interpretation:  Sinus rhythm Probable left atrial enlargement  Abnormal R-wave progression, early transition No significant change since  last tracing Confirmed by Maryan Rued  MD, Loree Fee ([id number]) on 10/08/2014  7:00:07 PM      MDM   Final diagnoses:  CAP (community acquired pneumonia)    Patient with infectious symptoms over the last 3 weeks that's worsened to where she went to her PCP today and was found to have multifocal pneumonia. Patient complains of shortness of breath with activity and  appears slightly short of breath on exam satting normally. Last time patient had an pneumonia and was admitted was approximately 3 years ago. Prior history of breast and ovarian cancer but all in remission at this time. Will treat patient with albuterol, Atrovent, Rocephin and azithromycin for community-acquired pneumonia and admit for further care. She had labs including a CBC done prior to arrival which had a white count of 10,000 but otherwise was within normal limits and also had a chest x-ray which showed a multifocal pneumonia. Here we'll send a chem 8 and troponin. EKG was unchanged from prior  7:15 PM istat and trop wnl.  Will admit for further care.  Blanchie Dessert, MD 10/08/14 [phone]

## 2014-10-08 NOTE — H&P (Signed)
[facility] History and Physical  TAMMATHA COBB YKD:[id number] DOB: June 05, 1941 DOA: 10/08/2014  Referring physician: ER physician. PCP: Gerrit Heck, MD   Chief Complaint: Shortness of breath.  HPI: Monique Watson is a 74 y.o. female with history of hypertension, breast and ovarian cancer on tamoxifen was referred to the ER after patient had gone to her PCP and chest x-ray done by her PCP showed multifocal pneumonia. Patient states over the last couple of weeks patient has been having shortness of breath with productive cough which is gradually worsening and had subjective feeling of fever and chills. Denies any recent travel or sick contacts. Patient has pleuritic type of chest pain. Patient was started on empiric antibiotics and admitted for community-acquired pneumonia. Patient denies any nausea vomiting or abdominal pain.   Review of Systems: As presented in the history of presenting illness, rest negative.  Past Medical History  Diagnosis Date  . Breast cancer 09/29/2011  . Ovarian cancer 09/29/2011  . Arthritis   . Pneumonia   . Hypertension 10/29/2011   Past Surgical History  Procedure Laterality Date  . Abdominal hysterectomy    . Breast lumpectomy    . Tonsillectomy    . Appendectomy     Social History:  reports that she has quit smoking. She does not have any smokeless tobacco history on file. She reports that she drinks about 0.6 oz of alcohol per week. She reports that she does not use illicit drugs. Where does patient live home. Can patient participate in ADLs? Yes.  Allergies  Allergen Reactions  . Arimidex [Anastrozole] Other (See Comments)    Body aches per patient. Irven Baltimore, RN.  Marland Kitchen Diovan [Valsartan] Other (See Comments)    arthralgias  . Uncoded Nonscreenable Allergen     Blood pressure medication patient stated that she has a low tolerance for    Family History:  Family History  Problem Relation Age of Onset  . Ovarian cancer  Mother 23  . Colon cancer Brother 59  . Breast cancer Paternal Aunt     6-7 with breast cancer; one bilateral, several premenopausal  . Pancreatitis Brother 2  . Colon cancer Paternal Aunt   . Cancer Cousin     NOS      Prior to Admission medications   Medication Sig Start Date End Date Taking? Authorizing Provider  Calcium Carbonate-Vitamin D (CALCIUM 600 + D PO) Take 1 tablet by mouth 2 (two) times daily.   Yes Historical Provider, MD  Cholecalciferol (VITAMIN D3) 2000 UNITS TABS Take 1 capsule by mouth once a week.    Yes Historical Provider, MD  docusate sodium (COLACE) 100 MG capsule Take 100 mg by mouth daily.   Yes Historical Provider, MD  ferrous fumarate (HEMOCYTE - 106 MG FE) 325 (106 FE) MG TABS Take 1 tablet by mouth every 7 (seven) days. No certain day   Yes Historical Provider, MD  ibuprofen (ADVIL,MOTRIN) 800 MG tablet Take 800 mg by mouth every 8 (eight) hours as needed. For pain   Yes Historical Provider, MD  lidocaine-prilocaine (EMLA) cream Apply 1 application topically as needed. For port   Yes Historical Provider, MD  Omega-3 Fatty Acids (FISH OIL CONCENTRATE) 1000 MG CAPS Take 1,000 mg by mouth daily.    Yes Historical Provider, MD  Potassium 99 MG TABS Take 2 tablets by mouth daily.    Yes Historical Provider, MD  tamoxifen (NOLVADEX) 20 MG tablet Take 1 tablet (20 mg total) by mouth daily. 07/06/14  Yes Lennis Marion Downer, MD  vitamin E (VITAMIN E) 400 UNIT capsule Take 400 Units by mouth 2 (two) times daily.   Yes Historical Provider, MD  cloNIDine (CATAPRES) 0.1 MG tablet Take 1 tablet (0.1 mg total) by mouth at bedtime. Patient not taking: Reported on 10/08/2014 07/06/14   Gordy Levan, MD    Physical Exam: Filed Vitals:   10/08/14 1829 10/08/14 1831 10/08/14 1939 10/08/14 2015  BP: 173/94 177/83 190/69   Pulse: 98 93 90 88  Temp: 98.9 F (37.2 C) 98.9 F (37.2 C) 99.2 F (37.3 C)   TempSrc: Oral Oral Oral   Resp: 22 24 29 26   SpO2: 99% 98% 100% 100%      General:  Moderately built and nourished.  Eyes: Anicteric no pallor.  ENT: No discharge from the ears eyes nose and mouth.  Neck: No mass felt.  Cardiovascular: S1 and S2 heard.  Respiratory: No rhonchi or crepitations.  Abdomen: Soft nontender bowel sounds present.  Skin: No rash.  Musculoskeletal: No edema.  Psychiatric: Appears normal.  Neurologic: Alert awake oriented to time place and person. Moves all extremities.  Labs on Admission:  Basic Metabolic Panel:  Recent Labs Lab 10/08/14 1903  NA 140  K 3.3*  CL 104  GLUCOSE 110*  BUN 11  CREATININE 0.80   Liver Function Tests: No results for input(s): AST, ALT, ALKPHOS, BILITOT, PROT, ALBUMIN in the last 168 hours. No results for input(s): LIPASE, AMYLASE in the last 168 hours. No results for input(s): AMMONIA in the last 168 hours. CBC:  Recent Labs Lab 10/08/14 1903  HGB 11.2*  HCT 33.0*   Cardiac Enzymes: No results for input(s): CKTOTAL, CKMB, CKMBINDEX, TROPONINI in the last 168 hours.  BNP (last 3 results) No results for input(s): PROBNP in the last 8760 hours. CBG: No results for input(s): GLUCAP in the last 168 hours.  Radiological Exams on Admission: No results found.  EKG: Independently reviewed. Normal sinus rhythm.  Assessment/Plan Principal Problem:   Pneumonia Active Problems:   Breast cancer   Essential hypertension   CAP (community acquired pneumonia)   1. Community acquired pneumonia - patient's chest x-ray was done as outpatient which showed multifocal pneumonia. I have ordered repeat chest x-ray in a.m. to see progression. Check influenza PCR urine for strep antigen and Legionella antigen. HIV status. Continue with ceftriaxone and Zithromax. 2. Hypertension - patient is on clonidine at bedtime. Has not been taking it last 1 week as patient ran out of medication. 3. Chronic anemia - follow CBC and further workup as outpatient if there is not significant for  hemoglobin. 4. Hypokalemia - I have ordered a repeat metabolic panel and if low needs replacement. Check magnesium levels. 5. History of breast cancer and ovarian cancer on tamoxifen.   DVT Prophylaxis Lovenox.  Code Status: Full code.  Family Communication: Family at the bedside.  Disposition Plan: Admit to inpatient.    Manasvi Dickard N. [facility] Pager [phone].  If 7PM-7AM, please contact night-coverage [email or web address] Password [email or web address] 10/08/2014, 10:30 PM

## 2014-10-08 NOTE — ED Notes (Signed)
Pt c/o increasing SOB, coughing, and generalized body aches x 3 weeks.  Pain score 6/10.  Pt was seen at PCP this afternoon and dx w/ possible PNA.  Hx of breast and ovarian CA.  Pt is currently not receiving treatments

## 2014-10-08 NOTE — Progress Notes (Signed)
Critical lab value potassium 2.4. On call provider notified. No new orders at this time. Will continue to monitor.

## 2014-10-09 ENCOUNTER — Inpatient Hospital Stay (HOSPITAL_COMMUNITY): Payer: Medicare Other

## 2014-10-09 DIAGNOSIS — E876 Hypokalemia: Secondary | ICD-10-CM

## 2014-10-09 DIAGNOSIS — C50919 Malignant neoplasm of unspecified site of unspecified female breast: Secondary | ICD-10-CM

## 2014-10-09 LAB — MAGNESIUM: Magnesium: 1.8 mg/dL (ref 1.5–2.5)

## 2014-10-09 LAB — COMPREHENSIVE METABOLIC PANEL
ALT: 11 U/L (ref 0–35)
AST: 22 U/L (ref 0–37)
Albumin: 2.6 g/dL — ABNORMAL LOW (ref 3.5–5.2)
Alkaline Phosphatase: 55 U/L (ref 39–117)
Anion gap: 4 — ABNORMAL LOW (ref 5–15)
BUN: 8 mg/dL (ref 6–23)
CO2: 28 mmol/L (ref 19–32)
Calcium: 8.1 mg/dL — ABNORMAL LOW (ref 8.4–10.5)
Chloride: 108 mmol/L (ref 96–112)
Creatinine, Ser: 0.66 mg/dL (ref 0.50–1.10)
GFR calc Af Amer: >90 mL/min (ref >90)
GFR calc non Af Amer: 84 mL/min — ABNORMAL LOW (ref >90)
Glucose, Bld: 125 mg/dL — ABNORMAL HIGH (ref 70–99)
Potassium: 3.3 mmol/L — ABNORMAL LOW (ref 3.5–5.1)
Sodium: 140 mmol/L (ref 135–145)
Total Bilirubin: 0.3 mg/dL (ref 0.3–1.2)
Total Protein: 6.8 g/dL (ref 6.0–8.3)

## 2014-10-09 LAB — EXPECTORATED SPUTUM ASSESSMENT W GRAM STAIN, RFLX TO RESP C

## 2014-10-09 LAB — INFLUENZA PANEL BY PCR (TYPE A & B)
H1N1 flu by pcr: NOT DETECTED
Influenza A By PCR: NEGATIVE
Influenza B By PCR: NEGATIVE

## 2014-10-09 LAB — EXPECTORATED SPUTUM ASSESSMENT W REFEX TO RESP CULTURE

## 2014-10-09 LAB — STREP PNEUMONIAE URINARY ANTIGEN: Strep Pneumo Urinary Antigen: NEGATIVE

## 2014-10-09 MED ORDER — OSELTAMIVIR PHOSPHATE 75 MG PO CAPS
75.0000 mg | ORAL_CAPSULE | Freq: Every day | ORAL | Status: DC
  Administered 2014-10-09: 75 mg via ORAL
  Filled 2014-10-09 (×2): qty 1

## 2014-10-09 MED ORDER — MOMETASONE FURO-FORMOTEROL FUM 100-5 MCG/ACT IN AERO
2.0000 | INHALATION_SPRAY | Freq: Two times a day (BID) | RESPIRATORY_TRACT | Status: DC
  Administered 2014-10-09 – 2014-10-11 (×5): 2 via RESPIRATORY_TRACT
  Filled 2014-10-09: qty 8.8

## 2014-10-09 MED ORDER — TIOTROPIUM BROMIDE MONOHYDRATE 18 MCG IN CAPS
18.0000 ug | ORAL_CAPSULE | Freq: Every day | RESPIRATORY_TRACT | Status: DC
  Administered 2014-10-09 – 2014-10-11 (×3): 18 ug via RESPIRATORY_TRACT
  Filled 2014-10-09: qty 5

## 2014-10-09 MED ORDER — SODIUM CHLORIDE 0.9 % IV SOLN
INTRAVENOUS | Status: DC
  Administered 2014-10-09: 01:00:00 via INTRAVENOUS

## 2014-10-09 MED ORDER — POTASSIUM CHLORIDE CRYS ER 20 MEQ PO TBCR
40.0000 meq | EXTENDED_RELEASE_TABLET | Freq: Every day | ORAL | Status: DC
  Administered 2014-10-09: 40 meq via ORAL
  Filled 2014-10-09 (×2): qty 2

## 2014-10-09 MED ORDER — ALBUTEROL SULFATE (2.5 MG/3ML) 0.083% IN NEBU
5.0000 mg | INHALATION_SOLUTION | RESPIRATORY_TRACT | Status: DC | PRN
  Administered 2014-10-10: 5 mg via RESPIRATORY_TRACT
  Filled 2014-10-09: qty 6

## 2014-10-09 NOTE — Progress Notes (Signed)
Monique Watson [id number]:[id number] DOB: 16-Apr-1941 DOA: 10/08/2014 PCP: Gerrit Heck, MD  Brief narrative: 74 y/o ? 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 on Tamoxifen since 2011 admittedfrom PCP office 2/2 to multifocal PNA, MOd hypokalemia  Past medical history-As per Problem list Chart reviewed as below- none  Consultants:  none  Procedures:  none  Antibiotics:  Ceftriaxone 1/22  Azithromycin 1/22   Subjective   Fair feels ~ 25% better no n/v/cp/diarr Had breakfast Still a little SOB + sputum Doesn't use nebs typically    Objective    Interim History:   Telemetry:    Objective: Filed Vitals:   10/08/14 1939 10/08/14 2015 10/08/14 2220 10/09/14 0535  BP: 190/69  178/69 128/62  Pulse: 90 88 91 78  Temp: 99.2 F (37.3 C)  99.7 F (37.6 C) 98 F (36.7 C)  TempSrc: Oral  Oral Oral  Resp: _0 Height:   _1  (1.676 m)   Weight:   82.827 kg (182 lb 9.6 oz)   SpO2: 100% 100% 100% 100%    Intake/Output Summary (Last 24 hours) at 10/09/14 1111 Last data filed at 10/09/14 1009  Gross per 24 hour  Intake    600 ml  Output    200 ml  Net    400 ml    Exam:  General: eomi, ncat, no ict/pallor Cardiovascular: si sii no m/r/g Respiratory: whesoft nt nd ezy + coarse crackles post basally Abdomen: soft Skin no le edema Neuro intact  Data Reviewed: Basic Metabolic Panel:  Recent Labs Lab 10/08/14 1903 10/08/14 2300  NA 140 139  K 3.3* 2.4*  CL 104 106  CO2  --  25  GLUCOSE 110* 153*  BUN 11 9  CREATININE 0.80 0.73  CALCIUM  --  7.9*  MG  --  1.8   Liver Function Tests:  Recent Labs Lab 10/08/14 2300  AST 21  ALT 15  ALKPHOS 51  BILITOT 0.4  PROT 6.9  ALBUMIN 2.7*   No results for input(s): LIPASE, AMYLASE in the last 168 hours. No results for input(s): AMMONIA in the last 168 hours. CBC:  Recent Labs Lab 10/08/14 1903  10/08/14 2300  WBC  --  10.8*  NEUTROABS  --  8.5*  HGB 11.2* 9.2*  HCT 33.0* 26.6*  MCV  --  97.1  PLT  --  170   Cardiac Enzymes: No results for input(s): CKTOTAL, CKMB, CKMBINDEX, TROPONINI in the last 168 hours. BNP: Invalid input(s): POCBNP CBG: No results for input(s): GLUCAP in the last 168 hours.  Recent Results (from the past 240 hour(s))  Culture, sputum-assessment     Status: None   Collection Time: 10/09/14 12:46 AM  Result Value Ref Range Status   Specimen Description SPUTUM  Final   Special Requests NONE  Final   Sputum evaluation   Final    THIS SPECIMEN IS ACCEPTABLE. RESPIRATORY CULTURE REPORT TO FOLLOW.   Report Status 10/09/2014 FINAL  Final  Culture, respiratory (NON-Expectorated)     Status: None (Preliminary result)   Collection Time: 10/09/14 12:46 AM  Result Value Ref Range Status   Specimen Description SPUTUM  Final   Special Requests NONE  Final   Gram Stain   Final    FEW WBC PRESENT,BOTH PMN AND MONONUCLEAR RARE SQUAMOUS EPITHELIAL CELLS PRESENT FEW GRAM NEGATIVE RODS Performed at [employer] [facility]  PENDING  Incomplete   Report Status PENDING  Incomplete     Studies:              All Imaging reviewed and is as per above notation   Scheduled Meds: . azithromycin  500 mg Intravenous Q24H  . cefTRIAXone (ROCEPHIN)  IV  1 g Intravenous Q24H  . cholecalciferol  1,000 Units Oral Weekly  . cloNIDine  0.1 mg Oral QHS  . docusate sodium  100 mg Oral Daily  . enoxaparin (LOVENOX) injection  40 mg Subcutaneous QHS  . ferrous fumarate  1 tablet Oral Q7 days  . omega-3 acid ethyl esters  1,000 mg Oral Daily  . oseltamivir  75 mg Oral Daily  . sodium chloride  3 mL Intravenous Q12H  . tamoxifen  20 mg Oral Daily   Continuous Infusions: . sodium chloride 10 mL/hr at 10/09/14 0129     Assessment/Plan: 1. Multi-focal PNA-continue CAP coverage Ceftriaxone/Azithro.  CCB + diff am.  OOB, Flutter valve for sputum.  Follow Inf PCR-D/c  tamiflu if neg 2. 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  Continue Albuterol nebs q4 prn.  Congratulated on smoking cessation-no role steroids right now.  D/c oxygen-keep sats 90-92%.  Desat walking screen requested 3. Breast/Ovarian Ca-Op interval surveillance per Oncology-no contraindication to prophylactic Tamoxifen 4. Elevated Dimer-2/2 to PNa-not tachycardic nor hypoxic nor having CP-Would not Ct chest. 5. AoCD/Malignancy-continue Ferrous fumarate 1 tab q7 d 6. Impaired gluc tol-Consider OP A1c  Code Status: Full Family Communication:  None bedsdie Disposition Plan: inpatient ~ 48 more hours?   [name], MD  [facility] Pager [phone] 10/09/2014, 11:11 AM    LOS: 1 day

## 2014-10-10 LAB — CBC WITH DIFFERENTIAL/PLATELET
Basophils Absolute: 0 10*3/uL (ref 0.0–0.1)
Basophils Relative: 0 % (ref 0–1)
Eosinophils Absolute: 0.2 10*3/uL (ref 0.0–0.7)
Eosinophils Relative: 4 % (ref 0–5)
HCT: 28.9 % — ABNORMAL LOW (ref 36.0–46.0)
Hemoglobin: 9.9 g/dL — ABNORMAL LOW (ref 12.0–15.0)
Lymphocytes Relative: 32 % (ref 12–46)
Lymphs Abs: 1.6 10*3/uL (ref 0.7–4.0)
MCH: 33.6 pg (ref 26.0–34.0)
MCHC: 34.3 g/dL (ref 30.0–36.0)
MCV: 98 fL (ref 78.0–100.0)
Monocytes Absolute: 0.5 10*3/uL (ref 0.1–1.0)
Monocytes Relative: 9 % (ref 3–12)
Neutro Abs: 2.7 10*3/uL (ref 1.7–7.7)
Neutrophils Relative %: 55 % (ref 43–77)
Platelets: 162 10*3/uL (ref 150–400)
RBC: 2.95 MIL/uL — ABNORMAL LOW (ref 3.87–5.11)
RDW: 13.4 % (ref 11.5–15.5)
WBC: 5.1 10*3/uL (ref 4.0–10.5)

## 2014-10-10 LAB — HIV ANTIBODY (ROUTINE TESTING W REFLEX)
HIV 1/O/2 Abs-Index Value: <1 (ref <1.00)
HIV-1/HIV-2 Ab: NONREACTIVE

## 2014-10-10 MED ORDER — POTASSIUM CHLORIDE CRYS ER 20 MEQ PO TBCR
40.0000 meq | EXTENDED_RELEASE_TABLET | Freq: Two times a day (BID) | ORAL | Status: DC
  Administered 2014-10-10 – 2014-10-11 (×3): 40 meq via ORAL
  Filled 2014-10-10 (×3): qty 2

## 2014-10-10 MED ORDER — LEVOFLOXACIN 500 MG PO TABS
500.0000 mg | ORAL_TABLET | Freq: Every day | ORAL | Status: DC
  Administered 2014-10-10 – 2014-10-11 (×2): 500 mg via ORAL
  Filled 2014-10-10 (×4): qty 1

## 2014-10-10 MED ORDER — ALBUTEROL SULFATE (2.5 MG/3ML) 0.083% IN NEBU
5.0000 mg | INHALATION_SOLUTION | Freq: Two times a day (BID) | RESPIRATORY_TRACT | Status: DC
  Administered 2014-10-10 – 2014-10-11 (×2): 5 mg via RESPIRATORY_TRACT
  Filled 2014-10-10 (×2): qty 6

## 2014-10-10 NOTE — Progress Notes (Addendum)
Monique Watson [id number] DOB: November 08, 1940 DOA: 10/08/2014 PCP: Gerrit Heck, MD  Brief narrative: 74 y/o ? 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 on Tamoxifen since 2011 admittedfrom PCP office 2/2 to multifocal PNA, MOd hypokalemia  Past medical history-As per Problem list Chart reviewed as below- none  Consultants:  none  Procedures:  none  Antibiotics:  Ceftriaxone 1/22  Azithromycin 1/22   Subjective   Fair feels ~ 75% better Mild sputum-green tol diet Ambulant No cp    Objective    Interim History:   Telemetry:    Objective: Filed Vitals:   10/09/14 1946 10/09/14 2105 10/10/14 0552 10/10/14 1107  BP:  135/61 138/92   Pulse:  83 72   Temp:  98.2 F (36.8 C) 98 F (36.7 C)   TempSrc:  Oral Oral   Resp:  20 20   Height:      Weight:      SpO2: 93% 96% 100% 97%    Intake/Output Summary (Last 24 hours) at 10/10/14 1400 Last data filed at 10/10/14 0553  Gross per 24 hour  Intake    300 ml  Output   1100 ml  Net   -800 ml    Exam:  General: eomi, ncat, no ict/pallor Cardiovascular: si sii no m/r/g Respiratory: clearer than prior Abdomen: soft Skin no le edema Neuro intact  Data Reviewed: Basic Metabolic Panel:  Recent Labs Lab 10/08/14 1903 10/08/14 2300 10/09/14 1200  NA 140 139 140  K 3.3* 2.4* 3.3*  CL 104 106 108  CO2  --  25 28  GLUCOSE 110* 153* 125*  BUN '11 9 8  ' CREATININE 0.80 0.73 0.66  CALCIUM  --  7.9* 8.1*  MG  --  1.8  --    Liver Function Tests:  Recent Labs Lab 10/08/14 2300 10/09/14 1200  AST 21 22  ALT 15 11  ALKPHOS 51 55  BILITOT 0.4 0.3  PROT 6.9 6.8  ALBUMIN 2.7* 2.6*   No results for input(s): LIPASE, AMYLASE in the last 168 hours. No results for input(s): AMMONIA in the last 168 hours. CBC:  Recent Labs Lab 10/08/14 1903 10/08/14 2300 10/10/14 0530  WBC  --  10.8* 5.1  NEUTROABS   --  8.5* 2.7  HGB 11.2* 9.2* 9.9*  HCT 33.0* 26.6* 28.9*  MCV  --  97.1 98.0  PLT  --  170 162   Cardiac Enzymes: No results for input(s): CKTOTAL, CKMB, CKMBINDEX, TROPONINI in the last 168 hours. BNP: Invalid input(s): POCBNP CBG: No results for input(s): GLUCAP in the last 168 hours.  Recent Results (from the past 240 hour(s))  Culture, sputum-assessment     Status: None   Collection Time: 10/09/14 12:46 AM  Result Value Ref Range Status   Specimen Description SPUTUM  Final   Special Requests NONE  Final   Sputum evaluation   Final    THIS SPECIMEN IS ACCEPTABLE. RESPIRATORY CULTURE REPORT TO FOLLOW.   Report Status 10/09/2014 FINAL  Final  Culture, respiratory (NON-Expectorated)     Status: None (Preliminary result)   Collection Time: 10/09/14 12:46 AM  Result Value Ref Range Status   Specimen Description SPUTUM  Final   Special Requests NONE  Final   Gram Stain   Final    FEW WBC PRESENT,BOTH PMN AND MONONUCLEAR RARE SQUAMOUS EPITHELIAL CELLS PRESENT FEW GRAM NEGATIVE RODS Performed at [facility]  Culture   Final    MODERATE GRAM NEGATIVE RODS Performed at [facility]    Report Status PENDING  Incomplete     Studies:              All Imaging reviewed and is as per above notation   Scheduled Meds: . albuterol  5 mg Nebulization BID  . cholecalciferol  1,000 Units Oral Weekly  . cloNIDine  0.1 mg Oral QHS  . docusate sodium  100 mg Oral Daily  . enoxaparin (LOVENOX) injection  40 mg Subcutaneous QHS  . ferrous fumarate  1 tablet Oral Q7 days  . levofloxacin  500 mg Oral QAC breakfast  . mometasone-formoterol  2 puff Inhalation BID  . omega-3 acid ethyl esters  1,000 mg Oral Daily  . potassium chloride  40 mEq Oral BID  . sodium chloride  3 mL Intravenous Q12H  . tamoxifen  20 mg Oral Daily  . tiotropium  18 mcg Inhalation Daily   Continuous Infusions: . sodium chloride 10 mL/hr at 10/09/14 0129      Assessment/Plan: 1. Multi-focal PNA-continue CAP coverage Ceftriaxone/Azithro.  Sputum cult shows few Gr - rods.   Get am CBC/Diff.  OOB, Flutter valve for sputum.  Flu pcr neg 2. 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  Continue Albuterol nebs q4 prn.  Congratulated on smoking cessation-no role steroids right now.  D/c oxygen-keep sats 90-92%.  Desat walking screen  3. Breast/Ovarian Ca-Op interval surveillance per Oncology-no contraindication to prophylactic Tamoxifen 4. Elevated Dimer-2/2 to PNa-not tachycardic nor hypoxic nor having CP-Would not Ct chest. 5. AoCD/Malignancy-continue Ferrous fumarate 1 tab q7 d 6. Htn-continue Clonidine 0.1 qhs 7. Impaired gluc tol-Consider OP A1c  Code Status: Full Family Communication:  None bedside Disposition Plan: inpatient ~ d/c am if afebrile   Monique Griffes, MD  [facility] Pager [phone] 10/10/2014, 2:00 PM    LOS: 2 days

## 2014-10-11 LAB — CBC WITH DIFFERENTIAL/PLATELET
Basophils Absolute: 0 10*3/uL (ref 0.0–0.1)
Basophils Relative: 0 % (ref 0–1)
Eosinophils Absolute: 0.2 10*3/uL (ref 0.0–0.7)
Eosinophils Relative: 3 % (ref 0–5)
HCT: 27.2 % — ABNORMAL LOW (ref 36.0–46.0)
Hemoglobin: 9.7 g/dL — ABNORMAL LOW (ref 12.0–15.0)
Lymphocytes Relative: 28 % (ref 12–46)
Lymphs Abs: 1.7 10*3/uL (ref 0.7–4.0)
MCH: 36.5 pg — ABNORMAL HIGH (ref 26.0–34.0)
MCHC: 35.7 g/dL (ref 30.0–36.0)
MCV: 102.3 fL — ABNORMAL HIGH (ref 78.0–100.0)
Monocytes Absolute: 0.5 10*3/uL (ref 0.1–1.0)
Monocytes Relative: 9 % (ref 3–12)
Neutro Abs: 3.6 10*3/uL (ref 1.7–7.7)
Neutrophils Relative %: 60 % (ref 43–77)
Platelets: 181 10*3/uL (ref 150–400)
RBC: 2.66 MIL/uL — ABNORMAL LOW (ref 3.87–5.11)
RDW: 13.9 % (ref 11.5–15.5)
WBC: 6.1 10*3/uL (ref 4.0–10.5)

## 2014-10-11 LAB — CULTURE, RESPIRATORY W GRAM STAIN

## 2014-10-11 LAB — BASIC METABOLIC PANEL
Anion gap: 4 — ABNORMAL LOW (ref 5–15)
BUN: 10 mg/dL (ref 6–23)
CO2: 26 mmol/L (ref 19–32)
Calcium: 8.3 mg/dL — ABNORMAL LOW (ref 8.4–10.5)
Chloride: 112 mmol/L (ref 96–112)
Creatinine, Ser: 0.8 mg/dL (ref 0.50–1.10)
GFR calc Af Amer: 82 mL/min — ABNORMAL LOW (ref >90)
GFR calc non Af Amer: 70 mL/min — ABNORMAL LOW (ref >90)
Glucose, Bld: 111 mg/dL — ABNORMAL HIGH (ref 70–99)
Potassium: 4 mmol/L (ref 3.5–5.1)
Sodium: 142 mmol/L (ref 135–145)

## 2014-10-11 LAB — CULTURE, RESPIRATORY

## 2014-10-11 MED ORDER — TIOTROPIUM BROMIDE MONOHYDRATE 18 MCG IN CAPS: 18.0000 ug | ORAL_CAPSULE | Freq: Every day | RESPIRATORY_TRACT | Status: DC

## 2014-10-11 MED ORDER — HEPARIN SOD (PORK) LOCK FLUSH 100 UNIT/ML IV SOLN
500.0000 [IU] | INTRAVENOUS | Status: DC | PRN
  Filled 2014-10-11: qty 5

## 2014-10-11 MED ORDER — ALBUTEROL SULFATE HFA 108 (90 BASE) MCG/ACT IN AERS: 2.0000 | INHALATION_SPRAY | Freq: Four times a day (QID) | RESPIRATORY_TRACT | Status: DC | PRN

## 2014-10-11 MED ORDER — LEVOFLOXACIN 500 MG PO TABS: 500.0000 mg | ORAL_TABLET | Freq: Every day | ORAL | Status: DC

## 2014-10-11 NOTE — Discharge Summary (Signed)
Physician Discharge Summary  Monique Watson XNT:[id number] DOB: Jun 05, 1941 DOA: 10/08/2014  PCP: Gerrit Heck, MD  Admit date: 10/08/2014 Discharge date: 10/11/2014  Time spent: 25 minutes  Recommendations for Outpatient Follow-up:  1. Recommend CBC + Diff in 1 week 2. Recommend rpt CXR 1 mo denote clearing PNA 3. Please follow-up results of sputum culture-Levaquin should cover most organisms from a respiratory pathogen standpoint 4. Would consider as an outpatient HbA1c depending on blood sugar checks 5. Complete 3 more days ending 10/14/14 Levaquin-this should complete 6-7 days total of antibiotics 6. Needs interval follow-up with oncologist Dr. [name] 7. I have recommended patient follow-up with a pulmonologist of in her long smoking history to get PFTs etc. etc. 8. No home health needs identified  Discharge Diagnoses:  Principal Problem:   Pneumonia Active Problems:   Breast cancer   Essential hypertension   CAP (community acquired pneumonia)   Discharge Condition: Good  Diet recommendation: Low-salt heart healthy  Filed Weights   10/08/14 2220  Weight: 82.827 kg (182 lb 9.6 oz)    History of present illness:  74 y/o ? 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 Multi-focal initially was placed on CAP coverage Ceftriaxone/Azithro--this was transitioned to oral Levaquin 1/22 to complete on 1/26. Sputum cult shows few Gr - rods. White count steadily decreased during hospital stay from 10.8 to 6.1 on day of discharge.  OOB, Flutter valve for sputum. Flu pcr neg 2. 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 Continue Albuterol nebs q4 prn. Congratulated on smoking cessation-no role steroids this admission.  I have counseled the patient follow-up with pulmonologist as an outpatient for routine COPD per and have given her a name of her doctor 3. Breast/Ovarian Ca-Op interval surveillance per Oncology-no contraindication to prophylactic Tamoxifen 4. Elevated Dimer-2/2 to PNa-not tachycardic nor hypoxic nor having CP-Would not Ct chest. 5. AoCD/Malignancy-continue Ferrous fumarate 1 tab q7 d 6. Htn-continue Clonidine 0.1 qhs 7. Impaired gluc tol-Consider OP A1c  Consultants: 8. none  Procedures:  none  Antibiotics:  Ceftriaxone 1/21--> 1/22  Azithromycin 1/21--> 1/22 Levaquin 1/23 ?? 1/26  Discharge Exam: Filed Vitals:   10/11/14 0908  BP:   Pulse: 72  Temp:   Resp: 16   Alert pleasant oriented no fever no chills no other issues Tolerating diet No diarrhea No vomiting General: EOMI NCAT Cardiovascular: S1-S2 no murmur rub or gallop Respiratory: Clinically clear no rails no rhonchi  Discharge Instructions   Discharge Instructions    Diet - low sodium heart healthy    Complete by:  As directed      Discharge instructions    Complete by:  As directed   Continue levaquin until all gone Use Dulera inhaler x 2 per day I will rx a Albuterol inhaler which is a rescue inhaler-use when u are short of breath Go and see Dr. [name] as scheduled See Dr. Drema Dallas in ~ 1 week     Increase activity slowly    Complete by:  As directed           Current Discharge Medication List    START taking these medications  Details  albuterol (PROVENTIL HFA;VENTOLIN HFA) 108 (90 BASE) MCG/ACT inhaler Inhale 2 puffs into the lungs every 6 (six) hours as needed for wheezing or shortness of breath. Qty: 1 Inhaler, Refills: 2    levofloxacin (LEVAQUIN) 500 MG tablet Take 1 tablet (500 mg total) by mouth daily before breakfast. Qty: 3 tablet, Refills: 0    tiotropium (SPIRIVA) 18  MCG inhalation capsule Place 1 capsule (18 mcg total) into inhaler and inhale daily. Qty: 30 capsule, Refills: 12      CONTINUE these medications which have NOT CHANGED   Details  Calcium Carbonate-Vitamin D (CALCIUM 600 + D PO) Take 1 tablet by mouth 2 (two) times daily.    Cholecalciferol (VITAMIN D3) 2000 UNITS TABS Take 1 capsule by mouth once a week.     docusate sodium (COLACE) 100 MG capsule Take 100 mg by mouth daily.   Associated Diagnoses: Breast cancer; Other screening mammogram    ferrous fumarate (HEMOCYTE - 106 MG FE) 325 (106 FE) MG TABS Take 1 tablet by mouth every 7 (seven) days. No certain day    Omega-3 Fatty Acids (FISH OIL CONCENTRATE) 1000 MG CAPS Take 1,000 mg by mouth daily.     Potassium 99 MG TABS Take 2 tablets by mouth daily.     tamoxifen (NOLVADEX) 20 MG tablet Take 1 tablet (20 mg total) by mouth daily. Qty: 90 tablet, Refills: 0   Associated Diagnoses: Breast cancer, left breast; Ovarian cancer, unspecified laterality; Breast cancer, left; Breast cancer, female, left    vitamin E (VITAMIN E) 400 UNIT capsule Take 400 Units by mouth 2 (two) times daily.    cloNIDine (CATAPRES) 0.1 MG tablet Take 1 tablet (0.1 mg total) by mouth at bedtime. Qty: 30 tablet, Refills: 0   Associated Diagnoses: Breast cancer, left; Breast cancer, female, left; Breast cancer, left breast; Ovarian cancer, unspecified laterality      STOP taking these medications     ibuprofen (ADVIL,MOTRIN) 800 MG tablet      lidocaine-prilocaine (EMLA) cream        Allergies  Allergen Reactions  . Arimidex [Anastrozole] Other (See Comments)    Body aches per patient. Irven Baltimore, RN.  Marland Kitchen Diovan [Valsartan] Other (See Comments)    arthralgias  . Uncoded Nonscreenable Allergen     Blood pressure medication patient stated that she has a low tolerance for   Follow-up Information    Follow up with Gerrit Heck, MD. Go in 1 week.   Specialty:  Family Medicine   Contact  information:   [place] [address] [phone]       Follow up with [facility], MD. Go in 2 weeks.   Specialty:  Pulmonary Disease   Contact information:   [place] [address] [phone]        The results of significant diagnostics from this hospitalization (including imaging, microbiology, ancillary and laboratory) are listed below for reference.    Significant Diagnostic Studies: Dg Chest Port 1 View  10/09/2014   CLINICAL DATA:  Pneumonia  EXAM: PORTABLE CHEST - 1 VIEW  COMPARISON:  10/08/2014  FINDINGS: Right subclavian Port-A-Cath is in stable position, tip at the lower SVC.  Heart size is stable and normal. There is prominence of the hila which is at least partly technical given appearance on two-view chest from yesterday. Airspace disease in the right upper lobe and bilateral bases is stable.  IMPRESSION: Stable multi focal pneumonia.   Electronically Signed  By: Jorje Guild M.D.   On: 10/09/2014 06:12    Microbiology: Recent Results (from the past 240 hour(s))  Culture, sputum-assessment     Status: None   Collection Time: 10/09/14 12:46 AM  Result Value Ref Range Status   Specimen Description SPUTUM  Final   Special Requests NONE  Final   Sputum evaluation   Final    THIS SPECIMEN IS ACCEPTABLE. RESPIRATORY CULTURE REPORT TO FOLLOW.   Report Status 10/09/2014 FINAL  Final  Culture, respiratory (NON-Expectorated)     Status: None (Preliminary result)   Collection Time: 10/09/14 12:46 AM  Result Value Ref Range Status   Specimen Description SPUTUM  Final   Special Requests NONE  Final   Gram Stain   Final    FEW WBC PRESENT,BOTH PMN AND MONONUCLEAR RARE SQUAMOUS EPITHELIAL CELLS PRESENT FEW GRAM NEGATIVE RODS Performed at [facility]    Culture   Final    MODERATE GRAM NEGATIVE RODS Performed at [facility]    Report Status PENDING  Incomplete     Labs: Basic Metabolic Panel:  Recent  Labs Lab 10/08/14 1903 10/08/14 2300 10/09/14 1200 10/11/14 0445  NA 140 139 140 142  K 3.3* 2.4* 3.3* 4.0  CL 104 106 108 112  CO2  --  '25 28 26  ' GLUCOSE 110* 153* 125* 111*  BUN '11 9 8 10  ' CREATININE 0.80 0.73 0.66 0.80  CALCIUM  --  7.9* 8.1* 8.3*  MG  --  1.8  --   --    Liver Function Tests:  Recent Labs Lab 10/08/14 2300 10/09/14 1200  AST 21 22  ALT 15 11  ALKPHOS 51 55  BILITOT 0.4 0.3  PROT 6.9 6.8  ALBUMIN 2.7* 2.6*   No results for input(s): LIPASE, AMYLASE in the last 168 hours. No results for input(s): AMMONIA in the last 168 hours. CBC:  Recent Labs Lab 10/08/14 1903 10/08/14 2300 10/10/14 0530 10/11/14 0445  WBC  --  10.8* 5.1 6.1  NEUTROABS  --  8.5* 2.7 3.6  HGB 11.2* 9.2* 9.9* 9.7*  HCT 33.0* 26.6* 28.9* 27.2*  MCV  --  97.1 98.0 102.3*  PLT  --  170 162 181   Cardiac Enzymes: No results for input(s): CKTOTAL, CKMB, CKMBINDEX, TROPONINI in the last 168 hours. BNP: BNP (last 3 results) No results for input(s): PROBNP in the last 8760 hours. CBG: No results for input(s): GLUCAP in the last 168 hours.     SignedNita Sells  [facility] 10/11/2014, 10:09 AM

## 2014-10-12 ENCOUNTER — Encounter: Payer: Medicare Other | Admitting: Genetic Counselor

## 2014-10-14 ENCOUNTER — Telehealth: Payer: Self-pay | Admitting: Genetic Counselor

## 2014-10-14 LAB — LEGIONELLA ANTIGEN, URINE

## 2014-10-14 NOTE — Telephone Encounter (Signed)
VM is full and i could not leave a message.

## 2014-10-16 DIAGNOSIS — Z8679 Personal history of other diseases of the circulatory system: Secondary | ICD-10-CM | POA: Diagnosis not present

## 2014-10-16 DIAGNOSIS — E559 Vitamin D deficiency, unspecified: Secondary | ICD-10-CM | POA: Diagnosis not present

## 2014-10-16 DIAGNOSIS — J189 Pneumonia, unspecified organism: Secondary | ICD-10-CM | POA: Diagnosis not present

## 2014-10-16 DIAGNOSIS — E876 Hypokalemia: Secondary | ICD-10-CM | POA: Diagnosis not present

## 2014-10-29 ENCOUNTER — Other Ambulatory Visit: Payer: Self-pay | Admitting: Oncology

## 2014-10-29 ENCOUNTER — Telehealth: Payer: Self-pay | Admitting: Oncology

## 2014-10-29 ENCOUNTER — Telehealth: Payer: Self-pay | Admitting: Genetic Counselor

## 2014-10-29 NOTE — Telephone Encounter (Signed)
perpof oto sch pt appt-cld pt * left message of time & date of appt

## 2014-10-29 NOTE — Telephone Encounter (Signed)
Called patient to r/s appointment.  LM on VM asking that she call back.

## 2014-11-06 ENCOUNTER — Telehealth: Payer: Self-pay | Admitting: Genetic Counselor

## 2014-11-06 NOTE — Telephone Encounter (Signed)
LM on VM to please call to r/s her genetics appointment.

## 2014-11-12 ENCOUNTER — Encounter: Payer: Self-pay | Admitting: Genetic Counselor

## 2014-11-16 ENCOUNTER — Ambulatory Visit (HOSPITAL_BASED_OUTPATIENT_CLINIC_OR_DEPARTMENT_OTHER): Payer: Medicare Other

## 2014-11-16 VITALS — BP 151/70 | HR 81 | Temp 98.4°F

## 2014-11-16 DIAGNOSIS — Z452 Encounter for adjustment and management of vascular access device: Secondary | ICD-10-CM

## 2014-11-16 DIAGNOSIS — C50812 Malignant neoplasm of overlapping sites of left female breast: Secondary | ICD-10-CM | POA: Diagnosis not present

## 2014-11-16 DIAGNOSIS — R030 Elevated blood-pressure reading, without diagnosis of hypertension: Secondary | ICD-10-CM | POA: Diagnosis not present

## 2014-11-16 DIAGNOSIS — J189 Pneumonia, unspecified organism: Secondary | ICD-10-CM | POA: Diagnosis not present

## 2014-11-16 DIAGNOSIS — Z95828 Presence of other vascular implants and grafts: Secondary | ICD-10-CM

## 2014-11-16 MED ORDER — HEPARIN SOD (PORK) LOCK FLUSH 100 UNIT/ML IV SOLN
500.0000 [IU] | Freq: Once | INTRAVENOUS | Status: AC
  Administered 2014-11-16: 500 [IU] via INTRAVENOUS
  Filled 2014-11-16: qty 5

## 2014-11-16 MED ORDER — SODIUM CHLORIDE 0.9 % IJ SOLN
10.0000 mL | INTRAMUSCULAR | Status: DC | PRN
  Administered 2014-11-16: 10 mL via INTRAVENOUS
  Filled 2014-11-16: qty 10

## 2014-11-16 NOTE — Patient Instructions (Signed)

## 2014-11-27 ENCOUNTER — Encounter: Payer: Self-pay | Admitting: Oncology

## 2014-11-27 ENCOUNTER — Telehealth: Payer: Self-pay | Admitting: Genetic Counselor

## 2014-11-27 NOTE — Telephone Encounter (Signed)
LM on VM for Monique Watson, indicating that I was calling to r/s Monique Watson's genetic counseling appointnment on 4/14 at 9 AM.  Left message with my phone number to call back and r/s.

## 2014-11-27 NOTE — Telephone Encounter (Signed)
Could not leave a message on VM as the VM was full.  I am not here on 4/14 to see her and need to r/s.

## 2014-12-10 ENCOUNTER — Telehealth: Payer: Self-pay | Admitting: Genetic Counselor

## 2014-12-10 NOTE — Telephone Encounter (Signed)
Called pt and attempted to reschedule gen counseling appt per Roma Kayser.  Left message.

## 2014-12-16 DIAGNOSIS — Z1231 Encounter for screening mammogram for malignant neoplasm of breast: Secondary | ICD-10-CM | POA: Diagnosis not present

## 2014-12-16 DIAGNOSIS — Z853 Personal history of malignant neoplasm of breast: Secondary | ICD-10-CM | POA: Diagnosis not present

## 2014-12-17 ENCOUNTER — Encounter: Payer: Self-pay | Admitting: Oncology

## 2014-12-17 NOTE — Progress Notes (Signed)
Medical Oncology  Report received from [facility] of bilateral tomo screening mammograms done 12-16-14, no mammographic evidence of malignancy with benign calcifications on left and scattered fibroglandular density tissue.  Report to be scanned into this EMR  L.[name], MD

## 2014-12-30 ENCOUNTER — Other Ambulatory Visit: Payer: Self-pay

## 2014-12-30 ENCOUNTER — Other Ambulatory Visit: Payer: Self-pay | Admitting: Oncology

## 2014-12-30 DIAGNOSIS — C569 Malignant neoplasm of unspecified ovary: Secondary | ICD-10-CM

## 2014-12-30 DIAGNOSIS — C50912 Malignant neoplasm of unspecified site of left female breast: Secondary | ICD-10-CM

## 2014-12-31 ENCOUNTER — Other Ambulatory Visit: Payer: Medicare Other

## 2014-12-31 ENCOUNTER — Encounter: Payer: Self-pay | Admitting: Oncology

## 2014-12-31 ENCOUNTER — Ambulatory Visit: Payer: Medicare Other | Admitting: Genetic Counselor

## 2014-12-31 ENCOUNTER — Ambulatory Visit (HOSPITAL_BASED_OUTPATIENT_CLINIC_OR_DEPARTMENT_OTHER): Payer: Medicare Other

## 2014-12-31 ENCOUNTER — Telehealth: Payer: Self-pay | Admitting: Oncology

## 2014-12-31 ENCOUNTER — Other Ambulatory Visit (HOSPITAL_BASED_OUTPATIENT_CLINIC_OR_DEPARTMENT_OTHER): Payer: Medicare Other

## 2014-12-31 ENCOUNTER — Ambulatory Visit (HOSPITAL_BASED_OUTPATIENT_CLINIC_OR_DEPARTMENT_OTHER): Payer: Medicare Other | Admitting: Oncology

## 2014-12-31 VITALS — BP 160/71 | HR 74 | Temp 97.9°F | Resp 18 | Ht 66.0 in | Wt 188.8 lb

## 2014-12-31 DIAGNOSIS — Z452 Encounter for adjustment and management of vascular access device: Secondary | ICD-10-CM

## 2014-12-31 DIAGNOSIS — Z170 Estrogen receptor positive status [ER+]: Secondary | ICD-10-CM | POA: Diagnosis not present

## 2014-12-31 DIAGNOSIS — Z1509 Genetic susceptibility to other malignant neoplasm: Principal | ICD-10-CM

## 2014-12-31 DIAGNOSIS — Z860100 Personal history of colon polyps, unspecified: Secondary | ICD-10-CM

## 2014-12-31 DIAGNOSIS — Z8543 Personal history of malignant neoplasm of ovary: Secondary | ICD-10-CM

## 2014-12-31 DIAGNOSIS — C569 Malignant neoplasm of unspecified ovary: Secondary | ICD-10-CM | POA: Diagnosis not present

## 2014-12-31 DIAGNOSIS — D696 Thrombocytopenia, unspecified: Secondary | ICD-10-CM

## 2014-12-31 DIAGNOSIS — Z95828 Presence of other vascular implants and grafts: Secondary | ICD-10-CM

## 2014-12-31 DIAGNOSIS — Z1501 Genetic susceptibility to malignant neoplasm of breast: Secondary | ICD-10-CM

## 2014-12-31 DIAGNOSIS — Z87891 Personal history of nicotine dependence: Secondary | ICD-10-CM

## 2014-12-31 DIAGNOSIS — C50912 Malignant neoplasm of unspecified site of left female breast: Secondary | ICD-10-CM

## 2014-12-31 DIAGNOSIS — J3089 Other allergic rhinitis: Secondary | ICD-10-CM

## 2014-12-31 DIAGNOSIS — Z15068 Genetic susceptibility to other malignant neoplasm of digestive system: Secondary | ICD-10-CM

## 2014-12-31 LAB — COMPREHENSIVE METABOLIC PANEL (CC13)
ALT: 17 U/L (ref 0–55)
AST: 19 U/L (ref 5–34)
Albumin: 3.2 g/dL — ABNORMAL LOW (ref 3.5–5.0)
Alkaline Phosphatase: 57 U/L (ref 40–150)
Anion Gap: 9 mEq/L (ref 3–11)
BUN: 14.6 mg/dL (ref 7.0–26.0)
CO2: 25 mEq/L (ref 22–29)
Calcium: 8.3 mg/dL — ABNORMAL LOW (ref 8.4–10.4)
Chloride: 109 mEq/L (ref 98–109)
Creatinine: 1 mg/dL (ref 0.6–1.1)
EGFR: 61 mL/min/{1.73_m2} — ABNORMAL LOW (ref >90)
Glucose: 110 mg/dl (ref 70–140)
Potassium: 3.8 mEq/L (ref 3.5–5.1)
Sodium: 143 mEq/L (ref 136–145)
Total Bilirubin: 0.4 mg/dL (ref 0.20–1.20)
Total Protein: 7.3 g/dL (ref 6.4–8.3)

## 2014-12-31 LAB — CBC WITH DIFFERENTIAL/PLATELET
BASO%: 0.6 % (ref 0.0–2.0)
Basophils Absolute: 0 10*3/uL (ref 0.0–0.1)
EOS%: 2.5 % (ref 0.0–7.0)
Eosinophils Absolute: 0.1 10*3/uL (ref 0.0–0.5)
HCT: 37.9 % (ref 34.8–46.6)
HGB: 12.6 g/dL (ref 11.6–15.9)
LYMPH%: 28.6 % (ref 14.0–49.7)
MCH: 30.7 pg (ref 25.1–34.0)
MCHC: 33.3 g/dL (ref 31.5–36.0)
MCV: 92.2 fL (ref 79.5–101.0)
MONO#: 0.5 10*3/uL (ref 0.1–0.9)
MONO%: 9.3 % (ref 0.0–14.0)
NEUT#: 3.3 10*3/uL (ref 1.5–6.5)
NEUT%: 59 % (ref 38.4–76.8)
Platelets: 109 10*3/uL — ABNORMAL LOW (ref 145–400)
RBC: 4.11 10*6/uL (ref 3.70–5.45)
RDW: 13.8 % (ref 11.2–14.5)
WBC: 5.5 10*3/uL (ref 3.9–10.3)
lymph#: 1.6 10*3/uL (ref 0.9–3.3)

## 2014-12-31 MED ORDER — SODIUM CHLORIDE 0.9 % IJ SOLN
10.0000 mL | INTRAMUSCULAR | Status: DC | PRN
  Administered 2014-12-31: 10 mL via INTRAVENOUS
  Filled 2014-12-31: qty 10

## 2014-12-31 MED ORDER — HEPARIN SOD (PORK) LOCK FLUSH 100 UNIT/ML IV SOLN
500.0000 [IU] | Freq: Once | INTRAVENOUS | Status: AC
  Administered 2014-12-31: 500 [IU] via INTRAVENOUS
  Filled 2014-12-31: qty 5

## 2014-12-31 NOTE — Patient Instructions (Signed)

## 2014-12-31 NOTE — Telephone Encounter (Signed)
Received a pof about adding lab to flush today and the flush room is booked.  I added her to after dr [name] appointment

## 2014-12-31 NOTE — Progress Notes (Signed)
OFFICE PROGRESS NOTE   December 31, 2014   Physicians:D.ClarkePearson, E.Barnes, T.Brackbill , J.Mann  INTERVAL HISTORY:  Patient is seen, alone for visit, in scheduled follow up of left breast cancer, for which she continues tamoxifen, and history of ovarian cancer. Since I saw her last, genetic testing 08-2014 has identified BRCA 2 abnormality. She is to meet today with genetics counselor to discuss. She has 6 living siblings and 72 neices/ nephews; family history is + for breast, ovarian and colon cancers.   Most recent bilateral tomo mammograms were at [facility] 12-16-14, with scattered densities and no mammographic findings of concern. She had breast MRI in 01-2012; with new information of BRCA 2 mutation, she should have breast MRI within 3 months from most recent mammograms. She is claustrophobic, will try in the [employer] open scanner ([place] per breast navigator now).  She has PAC, which is being kept due to very poor peripheral venous access. She has had no problems with PAC. She has had variable mild thrombocytopenia x years.  Patient is symptomatic from environmental allergies, but otherwise has felt well, with no complaints or concerns that seem referable to cancer history or that treatment. Appetite and energy are good. She has no lower respiratory symptoms. She has no abdominal or pelvic discomfort. Bowels are moving regularly. She has had no bleeding or unusual bruising.  She saw Dr Mare Ferrari [phone], stress test and EF normal. She remains nonsmoking, tho this is challenging at times. Encouraged to continue. She was hospitalized for pneumonia 1-21 thru 10-11-14   [facility] in, flushed today Flu vaccine done Last colonoscopy 06-2012 by Dr Collene Mares, with 4 small sessile polyps, repeat planned in 5 years  ONCOLOGIC HISTORY The breast cancer was diagnosed in March 2010, multifocal T1N0 at lumpectomy and sentinel node evaluation, ER/PR positive and HER 2 negative. She had local  radiation, was intolerant to Femara and Aromasin due to severe arthralgias, and has been on tamoxifen since Aug 2011, planned for at least 5 years. Last bilateral mammograms were at [facility] 12-09-13 and last breast MRI was 01-2013, without findings of concern in breasts or nodal areas.  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 Surgery was TAH/BSO/omentectomy and radical complete debulking including anterior rectal resection with anastomosis. She had 6 cycles of adjuvant taxol/ carboplatin from 04-18-2005 thru 08-14-2005. She is followed yearly by gyn oncology She was found to have BRCA 2 mutation by testing 08-2014.     Review of systems as above, also: No bleeding. No swelling LE. No productive cough. No fever. Weight gain since DC smoking, snacks instead - disucssed. Remainder of 10 point Review of Systems negative.  Objective:  Vital signs in last 24 hours:  BP 160/71 mmHg  Pulse 74  Temp(Src) 97.9 F (36.6 C) (Oral)  Resp 18  Ht '5\' 6"'  (1.676 m)  Wt 188 lb 12.8 oz (85.639 kg)  BMI 30.49 kg/m2 Weight is up 3 lbs. Alert, oriented and appropriate. Ambulatory without difficulty. Sounds nasally congested but does not appear ill.  HEENT:PERRL, sclerae not icteric. Oral mucosa moist without lesions, posterior pharynx clear. Nasal turbinates without purulent drainage Neck supple. No JVD.  Lymphatics:no cervical,supraclavicular, axillary or inguinal adenopathy Resp: somewhat diminished BS thruout otherwise clear to auscultation bilaterally and normal percussion bilaterally Cardio: regular rate and rhythm. No gallop. GI: soft, nontender, not distended, no  mass or organomegaly. Normally active bowel sounds. Surgical incision not remarkable. Musculoskeletal/ Extremities: without pitting edema, cords, tenderness Neuro: nonfocal.  Psych: appropriate mood and affect. Skin without rash, ecchymosis, petechiae Breasts: left lumpectomy scar without evidence of local recurrence, otherwise bilaterally without dominant mass, skin or nipple findings. Axillae benign. Portacath-without erythema or tenderness  Lab Results:  Results for orders placed or performed in visit on 12/31/14  CBC with Differential  Result Value Ref Range   WBC 5.5 3.9 - 10.3 10e3/uL   NEUT# 3.3 1.5 - 6.5 10e3/uL   HGB 12.6 11.6 - 15.9 g/dL   HCT 37.9 34.8 - 46.6 %   Platelets 109 (L) 145 - 400 10e3/uL   MCV 92.2 79.5 - 101.0 fL   MCH 30.7 25.1 - 34.0 pg   MCHC 33.3 31.5 - 36.0 g/dL   RBC 4.11 3.70 - 5.45 10e6/uL   RDW 13.8 11.2 - 14.5 %   lymph# 1.6 0.9 - 3.3 10e3/uL   MONO# 0.5 0.1 - 0.9 10e3/uL   Eosinophils Absolute 0.1 0.0 - 0.5 10e3/uL   Basophils Absolute 0.0 0.0 - 0.1 10e3/uL   NEUT% 59.0 38.4 - 76.8 %   LYMPH% 28.6 14.0 - 49.7 %   MONO% 9.3 0.0 - 14.0 %   EOS% 2.5 0.0 - 7.0 %   BASO% 0.6 0.0 - 2.0 %  Comprehensive metabolic panel (Cmet) - [facility]  Result Value Ref Range   Sodium 143 136 - 145 mEq/L   Potassium 3.8 3.5 - 5.1 mEq/L   Chloride 109 98 - 109 mEq/L   CO2 25 22 - 29 mEq/L   Glucose 110 70 - 140 mg/dl   BUN 14.6 7.0 - 26.0 mg/dL   Creatinine 1.0 0.6 - 1.1 mg/dL   Total Bilirubin 0.40 0.20 - 1.20 mg/dL   Alkaline Phosphatase 57 40 - 150 U/L   AST 19 5 - 34 U/L   ALT 17 0 - 55 U/L   Total Protein 7.3 6.4 - 8.3 g/dL   Albumin 3.2 (L) 3.5 - 5.0 g/dL   Calcium 8.3 (L) 8.4 - 10.4 mg/dL   Anion Gap 9 3 - 11 mEq/L   EGFR 61 (L) >90 ml/min/1.73 m2    CA 125 available after visit 10, this having been 11 in 06-2015  Studies/Results:  No results found. Report of mammograms from [facility] 12-16-14 reviewed, scanned into this EMR.  Medications: I have reviewed the patient's current medications. Suggested adding flonase or nasocort spray to present claritin  DISCUSSION: we have discussed genetics testing results in general and  rationale for offering testing to family members. She will keep appointment with genetics counselor today. Even tho she is 58, both her ovarian and breast cancers have been in last 10 years, and I prefer breast MRI be done again this year. Not discussed, with the BRCA mutation it may be best to continue tamoxifen beyond 5 years, which will be Aug 2016.     Assessment/Plan:  1. Multifocal left breast cancer: Now found to have BRCA 2 mutation. Will get breast MRI prior to late June. Consider continuing tamoxifen beyond 5 years, ie beyond Aug 2016. I will see her back in 6 months or sooner if needed. Genetics counselor to follow up today. 2. IIIC ovarian carcinoma: Adjuvant chemotherapy completed 07-2005. BRCA 2 mutation. She should see Dr Josephina Shih again in ~ 06-2015. 3.PAC in, which needs flush every 8 wks. This was placed by Dr Bubba Camp in 2006. Extremely difficult peripheral IV access including  for blood draws. Keep this for now. 4.long tobacco, finally DCd.  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 CT Abd 2010 had normal spleen. Count has fluctuated from this level to low normal. Follow 6.environmental allergies more symptomatic now, plan as above 7..hypertension. Cardiology evaluation after episode of chest pain did not find concerns 8.weight gain since cigarettes DCd. 9.hospitalization for pneumonia early 2016 10.colon polyps, FH colon ca. Followed by Dr Collene Mares  Pateint understands discussion and recommendations as above. Time spent 25 min including >50% counseling and coordination of care. Cc Drs Drema Dallas and Collene Mares, and message to gyn onc to be sure she gets scheduled this fall.  Lyann Hagstrom P, MD   12/31/2014, 2:08 PM

## 2014-12-31 NOTE — Telephone Encounter (Signed)
per pof to sch pt appt-gave pt # to [facility] to sch Breast MRI-gave pt copy of sch

## 2015-01-01 DIAGNOSIS — Z1509 Genetic susceptibility to other malignant neoplasm: Principal | ICD-10-CM

## 2015-01-01 DIAGNOSIS — Z15068 Genetic susceptibility to other malignant neoplasm of digestive system: Secondary | ICD-10-CM | POA: Insufficient documentation

## 2015-01-01 DIAGNOSIS — Z1501 Genetic susceptibility to malignant neoplasm of breast: Secondary | ICD-10-CM | POA: Insufficient documentation

## 2015-01-01 LAB — CA 125: CA 125: 10 U/mL (ref <35)

## 2015-01-01 NOTE — Progress Notes (Signed)
GENETIC TEST RESULTS   Patient Name: Monique Watson Patient Age: 74 y.o. Encounter Date: 12/31/2014  Referring Provider: Evlyn Clines, MD    Monique Watson was seen today in the [place] clinic due to a personal and family history of cancer and for genetic counseling due to a recently identified pathogenic BRCA2 gene mutation called BRCA2, [id number]. Monique Watson was previously seen for her initial genetics visit by Roma Kayser, MS, CGC. Please refer to the prior Genetics clinic note for more information regarding Monique Watson's medical and family histories and our assessment at the time.   GENETIC TESTING: At the time of Monique Watson's previous visit with Monique Watson, it was recommended she pursue genetic testing of the OvaNext gene panel performed by [employer]. The OvaNext gene panel offered by [employer] includes sequencing and rearrangement analysis for the following 24 genes:ATM, BARD1, BRCA1, BRCA2, BRIP1, CDH1, CHEK2, EPCAM, MLH1, MRE11A, MSH2, MSH6, MUTYH, NBN, NF1, PALB2, PMS2, PTEN, RAD50, RAD51C, RAD51D, SMARCA4, STK11, and TP53. Testing revealed a pathogenic mutation in the BRCA2 gene called BRCA2, [id number]. A copy of the [facility] test report has been scanned into Epic under the media tab.  MEDICAL MANAGEMENT: At today's visit we discussed with Monique Watson that women who have a BRCA mutation have an increased risk for both breast and ovarian cancer. As discussed with Monique Watson, to reduce the risk for a future breast cancer, prophylactic bilateral mastectomy is the most effective option. However, for women who choose to keep their breasts, we recommend yearly mammograms, yearly breast MRI, twice-yearly clinical breast exams through a high-risk clinic, and monthly self-breast exams. To reduce the risk for ovarian cancer, we recommend Monique Watson  have a prophylactic bilateral salpingo-oophorectomy. We discussed that screening with CA-125 blood tests and transvaginal  ultrasounds can be done twice per year. However, these tests have not been shown to detect ovarian cancer at an early stage.  FAMILY MEMBERS: We discussed that it is important that all of Monique Watson's relatives (both men and women) know of the presence of this gene mutation. Site-specific genetic testing can sort out who in the family is at risk and who is not. We recommend relatives have genetic testing for this same mutation, as identifying the presence of this mutation would allow them to also take advantage of risk-reducing measures.   SUPPORT AND RESOURCES: If Monique Watson is interested in BRCA-specific information and support, there are two groups, Facing Our Risk (www.facingourrisk.com) and Bright Pink (www.brightpink.org) which some people have found useful. They provide opportunities to speak with other individuals from high-risk families. To locate genetic counselors in other cities, visit the website of the [employer] (ArtistMovie.se) and Secretary/administrator for a Social worker by zip code.  We encouraged Monique Watson to remain in contact with [place] on an annual basis so we can update her personal and family histories, and let her know of advances in cancer genetics that may benefit the family. Our contact number was provided. Monique Watson questions were answered to her satisfaction today, and she knows she is welcome to call anytime with additional questions.    Catherine A. Fine, MS, CGC Certified Genetic Counselor phone: [phone] [email or web address]

## 2015-01-02 DIAGNOSIS — Z860100 Personal history of colon polyps, unspecified: Secondary | ICD-10-CM | POA: Insufficient documentation

## 2015-01-02 DIAGNOSIS — Z95828 Presence of other vascular implants and grafts: Secondary | ICD-10-CM | POA: Insufficient documentation

## 2015-01-02 DIAGNOSIS — D696 Thrombocytopenia, unspecified: Secondary | ICD-10-CM | POA: Insufficient documentation

## 2015-01-02 DIAGNOSIS — Z87891 Personal history of nicotine dependence: Secondary | ICD-10-CM | POA: Insufficient documentation

## 2015-01-02 DIAGNOSIS — J3089 Other allergic rhinitis: Secondary | ICD-10-CM | POA: Insufficient documentation

## 2015-01-14 ENCOUNTER — Other Ambulatory Visit: Payer: Self-pay

## 2015-01-14 DIAGNOSIS — C569 Malignant neoplasm of unspecified ovary: Secondary | ICD-10-CM

## 2015-01-14 DIAGNOSIS — C50912 Malignant neoplasm of unspecified site of left female breast: Secondary | ICD-10-CM

## 2015-01-14 MED ORDER — TAMOXIFEN CITRATE 20 MG PO TABS: 20.0000 mg | ORAL_TABLET | Freq: Every day | ORAL | Status: DC

## 2015-01-26 ENCOUNTER — Ambulatory Visit
Admission: RE | Admit: 2015-01-26 | Discharge: 2015-01-26 | Disposition: A | Discharge status: Home / Self Care | Payer: Medicare Other | Source: Ambulatory Visit | Attending: Oncology | Admitting: Oncology

## 2015-01-26 DIAGNOSIS — Z1501 Genetic susceptibility to malignant neoplasm of breast: Secondary | ICD-10-CM | POA: Diagnosis not present

## 2015-01-26 DIAGNOSIS — Z853 Personal history of malignant neoplasm of breast: Secondary | ICD-10-CM | POA: Diagnosis not present

## 2015-01-26 DIAGNOSIS — N63 Unspecified lump in breast: Secondary | ICD-10-CM | POA: Diagnosis not present

## 2015-01-26 DIAGNOSIS — Z1509 Genetic susceptibility to other malignant neoplasm: Principal | ICD-10-CM

## 2015-01-26 MED ORDER — GADOBENATE DIMEGLUMINE 529 MG/ML IV SOLN
19.0000 mL | Freq: Once | INTRAVENOUS | Status: AC | PRN
  Administered 2015-01-26: 19 mL via INTRAVENOUS

## 2015-02-01 ENCOUNTER — Telehealth: Payer: Self-pay | Admitting: *Deleted

## 2015-02-01 NOTE — Telephone Encounter (Signed)
Received call from [employer] at [place] stating that patient did have an abnormal mammogram and wanted to verify it was okay for radiologist to do US guided biopsy. Told Anderson Malta that is fine to go ahead and arrange with patient and she is agreeable to fax over an order for Dr. [name] to sign.

## 2015-02-02 ENCOUNTER — Encounter: Payer: Self-pay | Admitting: *Deleted

## 2015-02-02 ENCOUNTER — Telehealth: Payer: Self-pay

## 2015-02-02 NOTE — Telephone Encounter (Signed)
Faxed signed orders dated 02-02-15 to [place].  Sent a copy to [facility] to be scanned into patient's EMR.

## 2015-02-04 NOTE — Progress Notes (Signed)
Monique Watson  Clinical Social Watson was referred by patient to review and complete healthcare advance directives.  Clinical Social Worker met with patient and spouse in [place] office.  The patient designated spouse Monique Watson as their primary healthcare agent and daughter Monique Watson as their secondary agent.  Patient also completed healthcare living will.    Clinical Social Worker notarized documents and made copies for patient/family. Clinical Social Worker will send documents to medical records to be scanned into patient's chart. Clinical Social Worker encouraged patient/family to contact with any additional questions or concerns.  Polo Riley, MSW, [place] Worker [facility] [phone]

## 2015-02-09 ENCOUNTER — Other Ambulatory Visit: Payer: Self-pay | Admitting: Radiology

## 2015-02-09 DIAGNOSIS — D241 Benign neoplasm of right breast: Secondary | ICD-10-CM | POA: Diagnosis not present

## 2015-02-09 DIAGNOSIS — Z853 Personal history of malignant neoplasm of breast: Secondary | ICD-10-CM | POA: Diagnosis not present

## 2015-02-09 DIAGNOSIS — N63 Unspecified lump in breast: Secondary | ICD-10-CM | POA: Diagnosis not present

## 2015-02-09 DIAGNOSIS — N6489 Other specified disorders of breast: Secondary | ICD-10-CM | POA: Diagnosis not present

## 2015-02-09 DIAGNOSIS — Z0000 Encounter for general adult medical examination without abnormal findings: Secondary | ICD-10-CM | POA: Diagnosis not present

## 2015-02-11 ENCOUNTER — Other Ambulatory Visit: Payer: Self-pay | Admitting: Oncology

## 2015-02-11 DIAGNOSIS — R928 Other abnormal and inconclusive findings on diagnostic imaging of breast: Secondary | ICD-10-CM

## 2015-02-24 ENCOUNTER — Ambulatory Visit
Admission: RE | Admit: 2015-02-24 | Discharge: 2015-02-24 | Disposition: A | Discharge status: Home / Self Care | Payer: Medicare Other | Source: Ambulatory Visit | Attending: Oncology | Admitting: Oncology

## 2015-02-24 ENCOUNTER — Other Ambulatory Visit: Payer: Self-pay | Admitting: Oncology

## 2015-02-24 DIAGNOSIS — D0511 Intraductal carcinoma in situ of right breast: Secondary | ICD-10-CM | POA: Diagnosis not present

## 2015-02-24 DIAGNOSIS — R928 Other abnormal and inconclusive findings on diagnostic imaging of breast: Secondary | ICD-10-CM

## 2015-02-24 DIAGNOSIS — N63 Unspecified lump in breast: Secondary | ICD-10-CM | POA: Diagnosis not present

## 2015-02-24 DIAGNOSIS — Z853 Personal history of malignant neoplasm of breast: Secondary | ICD-10-CM | POA: Diagnosis not present

## 2015-02-24 MED ORDER — GADOBENATE DIMEGLUMINE 529 MG/ML IV SOLN: 18.0000 mL | Freq: Once | INTRAVENOUS | Status: AC | PRN

## 2015-03-08 ENCOUNTER — Ambulatory Visit: Payer: Self-pay | Admitting: Surgery

## 2015-03-08 DIAGNOSIS — Z1502 Genetic susceptibility to malignant neoplasm of ovary: Secondary | ICD-10-CM | POA: Diagnosis not present

## 2015-03-08 DIAGNOSIS — D0511 Intraductal carcinoma in situ of right breast: Secondary | ICD-10-CM | POA: Diagnosis not present

## 2015-03-08 DIAGNOSIS — Z1501 Genetic susceptibility to malignant neoplasm of breast: Secondary | ICD-10-CM | POA: Diagnosis not present

## 2015-03-08 NOTE — H&P (Signed)
Patient words: RIGHT DCIS    pt sent at the request of Dr Isaiah Blakes for right breast microcalcifications core bx to be DCIS. MRI shows area to 5 mmx 8 mm. Hx BRCA 2 and had left breast conservation in 2010. Monique Watson had a port since 2008 due to poor venous access. Denies breast pain or nipple discharge.         ADDITIONAL INFORMATION: PROGNOSTIC INDICATORS - ACIS Results: IMMUNOHISTOCHEMICAL AND MORPHOMETRIC ANALYSIS BY THE AUTOMATED CELLULAR IMAGING SYSTEM (ACIS) Estrogen Receptor: 95%, POSITIVE, STRONG STAINING INTENSITY (PERFORMED MANUALLY) Progesterone Receptor: 0%, NEGATIVE (PERFORMED MANUALLY) COMMENT: The negative hormone receptor study(ies) in this case has no internal positive control. REFERENCE RANGE ESTROGEN RECEPTOR NEGATIVE <1% POSITIVE =>1% PROGESTERONE RECEPTOR NEGATIVE <1% POSITIVE =>1% All controls stained appropriately Enid Cutter MD Pathologist, Electronic Signature ( Signed 03/02/2015) FINAL DIAGNOSIS Diagnosis Breast, right, needle core biopsy, lower inner quadrant - DUCTAL CARCINOMA IN SITU WITH COMEDONECROSIS. - SEE COMMENT. 1 of 2 FINAL for Monique Watson, Monique Watson [phone]) Microscopic Comment Although definitive grading of tumor is best performed on excision specimen, as sampled, the ductal carcinoma in situ appears intermediate grade. A quantitative estrogen receptor and progesterone receptor will be performed and reported in an addendum to follow. Dr. Lyndon Code has seen this case in consultation with agreement. The findings are called to the [facility] on 02/25/15. (RAH:gt, 02/25/15) Willeen Niece MD Pathologist, Electronic Signature (Case signed 02/25/2015) Specimen Gross and Clinical Information Specimen Comment Formalin 8:20; mass seen w/screening mri brca+ Specimen(Watson) Obtained: Breast, right, needle core biopsy, lower inner quadrant Specimen Clinical Information R/O [facility] or B9 lesion Gross Received in formalin is a 2.8 x 2.5 x 0.5  cm aggregate of soft, yellow white fibrofatty tissue. All tissue is submitted in two blocks. (TB:gt, 02/24/15) Stain(Watson) used in Diagnosis: The following stain(Watson) were used in diagnosing the case: PR-ACIS, ER-ACIS. The control(Watson) stained appropriately. Disclaimer Estrogen receptor (SP1), immunohistochemical stains are performed on formalin fixed, paraffin embedded tissue using a 3,3"-diaminobenzidine (DAB) chromogen and DAKO Autostainer System. The staining intensity of the nucleus is scored morphometrically using the Automated Cellular Imaging System (ACIS) and is reported as the percentage of tumor cell nuclei demonstrating specific nuclear staining. PR progesterone receptor (PgR 636), immunohistochemical stains are performed on formalin fixed, paraffin embedded tissue using a 3,3"-diaminobenzidine (DAB) chromogen and DAKO Autostainer System. The staining intensity of the nucleus is scored morphometrically using the Automated Cellular Imaging System (ACIS) and is reported as the percentage of tumor cell nuclei demonstrating specific nuclear staining. Report signed out from the following location(Watson) Technical Component was performed at [facility] [address],[place] [address].[id number]., Technical component and interpretation was performed at [address], [place], [place] [address]. CLIA #: [id number], 2 of 2       The breast cancer was diagnosed in March 2010, multifocal T1N0 at lumpectomy and sentinel node evaluation, ER/PR positive and HER 2 negative. She had local radiation, was intolerant to Femara and Aromasin due to severe arthralgias, and has been on tamoxifen since Aug 2011, planned for at least 5 years. Last bilateral mammograms were at [facility] 12-09-13 and last breast MRI was 01-2013, without findings of concern in breasts or nodal areas.  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 Surgery was TAH/BSO/omentectomy and radical complete debulking including anterior rectal resection  with anastomosis. She had 6 cycles of adjuvant taxol/ carboplatin from 04-18-2005 thru 08-14-2005. She is followed yearly by gyn oncology She was found to have BRCA 2 mutation by testing 08-2014.   CLINICAL DATA: Personal history of left breast cancer status post lumpectomy 2010. Patient is BRCA 2 positive. Screening. LABS: BUN and creatinine were obtained on site at [place] at [address] Results: BUN 16 mg/dL, Creatinine 1.2 mg/dL, GFR 51. EXAM: BILATERAL BREAST MRI WITH AND WITHOUT CONTRAST TECHNIQUE: Multiplanar, multisequence MR images of both breasts were obtained prior to and following the intravenous administration of 19 ml of MultiHance. THREE-DIMENSIONAL MR IMAGE RENDERING ON INDEPENDENT WORKSTATION: Three-dimensional MR images were rendered by post-processing of the original MR data on an independent workstation. The three-dimensional MR images were interpreted, and findings are reported in the following complete MRI report for this study. Three dimensional images were evaluated at the independent DynaCad workstation COMPARISON: Previous exam(Watson) including MRI of the breast dated Jan 24, 2012. FINDINGS: Breast composition: b. Scattered fibroglandular tissue. Background parenchymal enhancement: Moderate. Right breast: There is a 5 x 9 x 8 mm mass enhancement at the slight lower slight medial right breast anterior to middle depth with plateau enhancement kinetics. Left breast: No mass or abnormal enhancement. Post lumpectomy changes are noted. Lymph nodes: No abnormal appearing lymph nodes. Ancillary findings: None. IMPRESSION: Suspicious findings. RECOMMENDATION: MRI guided core biopsy of right breast  enhancing mass. BI-RADS CATEGORY 4: Suspicious. Electronically Signed By: Abelardo Diesel M.D. On: 01/27/2015 13:41                    ADDITIONAL INFORMATION: PROGNOSTIC INDICATORS - ACIS Results: IMMUNOHISTOCHEMICAL AND MORPHOMETRIC ANALYSIS BY THE AUTOMATED CELLULAR IMAGING SYSTEM (ACIS) Estrogen Receptor: 95%, POSITIVE, STRONG STAINING INTENSITY (PERFORMED MANUALLY) Progesterone Receptor: 0%, NEGATIVE (PERFORMED MANUALLY) COMMENT: The negative hormone receptor study(ies) in this case has no internal positive control. REFERENCE RANGE ESTROGEN RECEPTOR NEGATIVE <1% POSITIVE =>1% PROGESTERONE RECEPTOR NEGATIVE <1% POSITIVE =>1% All controls stained appropriately Enid Cutter MD Pathologist, Electronic Signature ( Signed 03/02/2015) FINAL DIAGNOSIS Diagnosis Breast, right, needle core biopsy, lower inner quadrant - DUCTAL CARCINOMA IN SITU WITH COMEDONECROSIS. - SEE COMMENT. 1 of 2 FINAL for Monique Watson, Monique Watson [phone]) Microscopic Comment Although definitive grading of tumor is best performed on excision specimen, as sampled, the ductal carcinoma in situ appears intermediate grade. A quantitative estrogen receptor and progesterone receptor will be performed and reported in an addendum to follow. Dr. Lyndon Code has seen this case in consultation with agreement. The findings are called to the [place] on 02/25/15. (RAH:gt, 02/25/15) Willeen Niece MD Pathologist, Electronic Signature (Case signed 06/.  The patient is a 74 year old female   Allergies Elbert Ewings, CMA; 03/08/2015 3:00 PM) Arimidex *ANTINEOPLASTICS AND ADJUNCTIVE THERAPIES* Diovan *ANTIHYPERTENSIVES*  Medication History Elbert Ewings, CMA; 03/08/2015 3:04 PM) Tamoxifen Citrate (20MG Tablet, Oral) Active. Albuterol Sulfate (108 (90 Base)MCG/ACT Aero Pow Br Act, Inhalation) Active. Calcium Carbonate (600MG Tablet, Oral) Active. Vitamin D3 (1000UNIT Tablet, Oral)  Active. Colace (100MG Capsule, Oral) Active. Ferrous Sulfate CR (160 (50 Fe)MG Tablet ER, Oral) Active. Omega 3 (1000MG Capsule, Oral) Active. Potassium (99MG Tablet, Oral) Active. Vitamin E (400UNIT Capsule, Oral) Active. Medications Reconciled    Vitals Elbert Ewings CMA; 03/08/2015 3:04 PM) 03/08/2015 3:04 PM Weight: 193 lb Height: 66in Body Surface Area: 2.02 m Body Mass Index: 31.15 kg/m Temp.: 97.67F(Oral)  Pulse: 66 (Regular)  Resp.: 17 (Unlabored)  BP: 130/66 (Sitting, Left Arm, Standard)  Physical Exam (Dorothye Berni A. Mavrick Mcquigg MD; 03/08/2015 4:03 PM)  General Mental Status-Alert. General Appearance-Consistent with stated age. Hydration-Well hydrated. Voice-Normal.  Eye Eyeball - Bilateral-Extraocular movements intact. Sclera/Conjunctiva - Bilateral-No scleral icterus.  Chest and Lung Exam Chest and lung exam reveals -quiet, even and easy respiratory effort with no use of accessory muscles and on auscultation, normal breath sounds, no adventitious sounds and normal vocal resonance. Inspection Chest Wall - Normal. Back - normal.  Breast Note: left breast post surgical changes noted smaller right breast withpost bx changes no mass   Cardiovascular Cardiovascular examination reveals -normal heart sounds, regular rate and rhythm with no murmurs and normal pedal pulses bilaterally.  Lymphatic Axillary  General Axillary Region: Bilateral - Description - Normal. Tenderness - Non Tender.    Assessment & Plan (Latara Micheli A. Shaquanda Graves MD; 03/08/2015 3:33 PM)  BREAST NEOPLASM, TIS (DCIS), RIGHT (233.0  D05.11) Impression: DISCUSSED MASTECTOMY AND PARTIAL MASTECTOMY WITH PATIENT AND FAMILY. PT DESIRES RIGHT PARTIAL MASTECTOMY / LUMPECTOMY RIGHT Risk of lumpectomy include bleeding, infection, seroma, more surgery, use of seed/wire, wound care, cosmetic deformity and the need for other treatments, death , blood clots, death. Pt agrees to  proceed.  Current Plans Pt Education - Ductal carcinoma in situ: Treatment and prognosis: discussed with patient and provided information. Pt Education - [facility] Breast Biopsy HCI BRCA2 POSITIVE (V84.01  Z15.01)

## 2015-03-31 ENCOUNTER — Emergency Department (HOSPITAL_COMMUNITY)
Admission: EM | Admit: 2015-03-31 | Discharge: 2015-03-31 | Discharge status: Against Medical Advice | Payer: Medicare Other | Attending: Emergency Medicine | Admitting: Emergency Medicine

## 2015-03-31 ENCOUNTER — Encounter (HOSPITAL_COMMUNITY): Payer: Self-pay | Admitting: Emergency Medicine

## 2015-03-31 DIAGNOSIS — M542 Cervicalgia: Secondary | ICD-10-CM | POA: Diagnosis not present

## 2015-03-31 DIAGNOSIS — T189XXA Foreign body of alimentary tract, part unspecified, initial encounter: Secondary | ICD-10-CM | POA: Diagnosis not present

## 2015-03-31 DIAGNOSIS — X58XXXA Exposure to other specified factors, initial encounter: Secondary | ICD-10-CM | POA: Insufficient documentation

## 2015-03-31 DIAGNOSIS — Y939 Activity, unspecified: Secondary | ICD-10-CM | POA: Insufficient documentation

## 2015-03-31 DIAGNOSIS — I10 Essential (primary) hypertension: Secondary | ICD-10-CM | POA: Insufficient documentation

## 2015-03-31 DIAGNOSIS — Y999 Unspecified external cause status: Secondary | ICD-10-CM | POA: Diagnosis not present

## 2015-03-31 DIAGNOSIS — Y929 Unspecified place or not applicable: Secondary | ICD-10-CM | POA: Diagnosis not present

## 2015-03-31 NOTE — ED Notes (Signed)
Called for repeat vitals. No answer.

## 2015-03-31 NOTE — ED Notes (Signed)
Patient here with complaint of "swallowed a fish bone a couple days ago and its time for it to come out". No breathing difficulty. No vomiting or blood noted. Reports only a mild pain in her left neck. No other complaints. Additionally hypertensive in triage.

## 2015-03-31 NOTE — ED Notes (Signed)
Called for XR. No answer

## 2015-03-31 NOTE — ED Notes (Signed)
Called for repeat vitals. No answer

## 2015-04-02 ENCOUNTER — Encounter (HOSPITAL_BASED_OUTPATIENT_CLINIC_OR_DEPARTMENT_OTHER): Payer: Self-pay | Admitting: *Deleted

## 2015-04-06 ENCOUNTER — Encounter (HOSPITAL_BASED_OUTPATIENT_CLINIC_OR_DEPARTMENT_OTHER)
Admission: RE | Admit: 2015-04-06 | Discharge: 2015-04-06 | Disposition: A | Discharge status: Home / Self Care | Payer: Medicare Other | Source: Ambulatory Visit | Attending: Surgery | Admitting: Surgery

## 2015-04-06 DIAGNOSIS — Z6831 Body mass index (BMI) 31.0-31.9, adult: Secondary | ICD-10-CM | POA: Diagnosis not present

## 2015-04-06 DIAGNOSIS — Z0000 Encounter for general adult medical examination without abnormal findings: Secondary | ICD-10-CM | POA: Diagnosis not present

## 2015-04-06 DIAGNOSIS — Z79899 Other long term (current) drug therapy: Secondary | ICD-10-CM | POA: Diagnosis not present

## 2015-04-06 DIAGNOSIS — Z8543 Personal history of malignant neoplasm of ovary: Secondary | ICD-10-CM | POA: Diagnosis not present

## 2015-04-06 DIAGNOSIS — Z79810 Long term (current) use of selective estrogen receptor modulators (SERMs): Secondary | ICD-10-CM | POA: Diagnosis not present

## 2015-04-06 DIAGNOSIS — D0511 Intraductal carcinoma in situ of right breast: Secondary | ICD-10-CM | POA: Diagnosis not present

## 2015-04-06 DIAGNOSIS — I10 Essential (primary) hypertension: Secondary | ICD-10-CM | POA: Diagnosis not present

## 2015-04-06 DIAGNOSIS — C50911 Malignant neoplasm of unspecified site of right female breast: Secondary | ICD-10-CM | POA: Diagnosis not present

## 2015-04-06 DIAGNOSIS — Z87891 Personal history of nicotine dependence: Secondary | ICD-10-CM | POA: Diagnosis not present

## 2015-04-06 DIAGNOSIS — Z853 Personal history of malignant neoplasm of breast: Secondary | ICD-10-CM | POA: Diagnosis not present

## 2015-04-06 DIAGNOSIS — D0591 Unspecified type of carcinoma in situ of right breast: Secondary | ICD-10-CM | POA: Diagnosis not present

## 2015-04-06 LAB — CBC WITH DIFFERENTIAL/PLATELET
Basophils Absolute: 0 10*3/uL (ref 0.0–0.1)
Basophils Relative: 0 % (ref 0–1)
Eosinophils Absolute: 0.1 10*3/uL (ref 0.0–0.7)
Eosinophils Relative: 2 % (ref 0–5)
HCT: 36.7 % (ref 36.0–46.0)
Hemoglobin: 12.6 g/dL (ref 12.0–15.0)
Lymphocytes Relative: 32 % (ref 12–46)
Lymphs Abs: 1.8 10*3/uL (ref 0.7–4.0)
MCH: 33.5 pg (ref 26.0–34.0)
MCHC: 34.3 g/dL (ref 30.0–36.0)
MCV: 97.6 fL (ref 78.0–100.0)
Monocytes Absolute: 0.5 10*3/uL (ref 0.1–1.0)
Monocytes Relative: 9 % (ref 3–12)
Neutro Abs: 3.1 10*3/uL (ref 1.7–7.7)
Neutrophils Relative %: 57 % (ref 43–77)
Platelets: 100 10*3/uL — ABNORMAL LOW (ref 150–400)
RBC: 3.76 MIL/uL — ABNORMAL LOW (ref 3.87–5.11)
RDW: 13.4 % (ref 11.5–15.5)
WBC: 5.5 10*3/uL (ref 4.0–10.5)

## 2015-04-06 LAB — COMPREHENSIVE METABOLIC PANEL
ALT: 16 U/L (ref 14–54)
AST: 20 U/L (ref 15–41)
Albumin: 3.1 g/dL — ABNORMAL LOW (ref 3.5–5.0)
Alkaline Phosphatase: 48 U/L (ref 38–126)
Anion gap: 4 — ABNORMAL LOW (ref 5–15)
BUN: 12 mg/dL (ref 6–20)
CO2: 28 mmol/L (ref 22–32)
Calcium: 8.4 mg/dL — ABNORMAL LOW (ref 8.9–10.3)
Chloride: 108 mmol/L (ref 101–111)
Creatinine, Ser: 0.85 mg/dL (ref 0.44–1.00)
GFR calc Af Amer: >60 mL/min (ref >60)
GFR calc non Af Amer: >60 mL/min (ref >60)
Glucose, Bld: 81 mg/dL (ref 65–99)
Potassium: 4 mmol/L (ref 3.5–5.1)
Sodium: 140 mmol/L (ref 135–145)
Total Bilirubin: 0.8 mg/dL (ref 0.3–1.2)
Total Protein: 7.2 g/dL (ref 6.5–8.1)

## 2015-04-07 ENCOUNTER — Encounter (HOSPITAL_BASED_OUTPATIENT_CLINIC_OR_DEPARTMENT_OTHER): Payer: Self-pay | Admitting: *Deleted

## 2015-04-07 ENCOUNTER — Ambulatory Visit (HOSPITAL_BASED_OUTPATIENT_CLINIC_OR_DEPARTMENT_OTHER): Payer: Medicare Other | Admitting: Anesthesiology

## 2015-04-07 ENCOUNTER — Encounter (HOSPITAL_BASED_OUTPATIENT_CLINIC_OR_DEPARTMENT_OTHER)
Admission: RE | Disposition: A | Discharge status: Home / Self Care | Payer: Self-pay | Source: Ambulatory Visit | Attending: Surgery

## 2015-04-07 ENCOUNTER — Ambulatory Visit (HOSPITAL_BASED_OUTPATIENT_CLINIC_OR_DEPARTMENT_OTHER)
Admission: RE | Admit: 2015-04-07 | Discharge: 2015-04-07 | Disposition: A | Discharge status: Home / Self Care | Payer: Medicare Other | Source: Ambulatory Visit | Attending: Surgery | Admitting: Surgery

## 2015-04-07 DIAGNOSIS — D0511 Intraductal carcinoma in situ of right breast: Secondary | ICD-10-CM | POA: Insufficient documentation

## 2015-04-07 DIAGNOSIS — Z8543 Personal history of malignant neoplasm of ovary: Secondary | ICD-10-CM | POA: Insufficient documentation

## 2015-04-07 DIAGNOSIS — Z6831 Body mass index (BMI) 31.0-31.9, adult: Secondary | ICD-10-CM | POA: Insufficient documentation

## 2015-04-07 DIAGNOSIS — Z87891 Personal history of nicotine dependence: Secondary | ICD-10-CM | POA: Insufficient documentation

## 2015-04-07 DIAGNOSIS — Z853 Personal history of malignant neoplasm of breast: Secondary | ICD-10-CM | POA: Insufficient documentation

## 2015-04-07 DIAGNOSIS — Z79899 Other long term (current) drug therapy: Secondary | ICD-10-CM | POA: Insufficient documentation

## 2015-04-07 DIAGNOSIS — I10 Essential (primary) hypertension: Secondary | ICD-10-CM | POA: Diagnosis not present

## 2015-04-07 DIAGNOSIS — Z79810 Long term (current) use of selective estrogen receptor modulators (SERMs): Secondary | ICD-10-CM | POA: Insufficient documentation

## 2015-04-07 DIAGNOSIS — C50911 Malignant neoplasm of unspecified site of right female breast: Secondary | ICD-10-CM | POA: Diagnosis not present

## 2015-04-07 HISTORY — DX: Anemia, unspecified: D64.9

## 2015-04-07 HISTORY — PX: BREAST LUMPECTOMY WITH RADIOACTIVE SEED LOCALIZATION: SHX6424

## 2015-04-07 SURGERY — BREAST LUMPECTOMY WITH RADIOACTIVE SEED LOCALIZATION
Anesthesia: General | Site: Breast | Laterality: Right

## 2015-04-07 MED ORDER — LACTATED RINGERS IV SOLN
INTRAVENOUS | Status: DC
  Administered 2015-04-07 (×2): via INTRAVENOUS

## 2015-04-07 MED ORDER — ONDANSETRON HCL 4 MG/2ML IJ SOLN
INTRAMUSCULAR | Status: DC | PRN
  Administered 2015-04-07: 4 mg via INTRAVENOUS

## 2015-04-07 MED ORDER — HYDROMORPHONE HCL 1 MG/ML IJ SOLN
0.2500 mg | INTRAMUSCULAR | Status: DC | PRN
  Administered 2015-04-07 (×5): 0.25 mg via INTRAVENOUS

## 2015-04-07 MED ORDER — MIDAZOLAM HCL 2 MG/2ML IJ SOLN
1.0000 mg | INTRAMUSCULAR | Status: DC | PRN
  Administered 2015-04-07: 1 mg via INTRAVENOUS

## 2015-04-07 MED ORDER — FENTANYL CITRATE (PF) 100 MCG/2ML IJ SOLN
50.0000 ug | INTRAMUSCULAR | Status: DC | PRN
  Administered 2015-04-07 (×2): 50 ug via INTRAVENOUS

## 2015-04-07 MED ORDER — OXYCODONE HCL 5 MG PO TABS: 5.0000 mg | ORAL_TABLET | Freq: Once | ORAL | Status: DC | PRN

## 2015-04-07 MED ORDER — CEFAZOLIN SODIUM-DEXTROSE 2-3 GM-% IV SOLR
INTRAVENOUS | Status: AC
  Filled 2015-04-07: qty 50

## 2015-04-07 MED ORDER — HYDROMORPHONE HCL 1 MG/ML IJ SOLN
INTRAMUSCULAR | Status: AC
  Filled 2015-04-07: qty 1

## 2015-04-07 MED ORDER — MEPERIDINE HCL 25 MG/ML IJ SOLN: 6.2500 mg | INTRAMUSCULAR | Status: DC | PRN

## 2015-04-07 MED ORDER — BUPIVACAINE-EPINEPHRINE (PF) 0.25% -1:200000 IJ SOLN
INTRAMUSCULAR | Status: DC | PRN
  Administered 2015-04-07: 7 mL via PERINEURAL

## 2015-04-07 MED ORDER — DEXAMETHASONE SODIUM PHOSPHATE 4 MG/ML IJ SOLN
INTRAMUSCULAR | Status: DC | PRN
  Administered 2015-04-07: 10 mg via INTRAVENOUS

## 2015-04-07 MED ORDER — DEXTROSE 5 % IV SOLN
3.0000 g | INTRAVENOUS | Status: AC
  Administered 2015-04-07: 2 g via INTRAVENOUS

## 2015-04-07 MED ORDER — OXYCODONE HCL 5 MG/5ML PO SOLN: 5.0000 mg | Freq: Once | ORAL | Status: DC | PRN

## 2015-04-07 MED ORDER — PROMETHAZINE HCL 25 MG/ML IJ SOLN
6.2500 mg | INTRAMUSCULAR | Status: DC | PRN
  Administered 2015-04-07: 3.14 mg via INTRAVENOUS

## 2015-04-07 MED ORDER — GLYCOPYRROLATE 0.2 MG/ML IJ SOLN: 0.2000 mg | Freq: Once | INTRAMUSCULAR | Status: DC | PRN

## 2015-04-07 MED ORDER — SCOPOLAMINE 1 MG/3DAYS TD PT72: 1.0000 | MEDICATED_PATCH | Freq: Once | TRANSDERMAL | Status: DC | PRN

## 2015-04-07 MED ORDER — LIDOCAINE HCL (CARDIAC) 20 MG/ML IV SOLN
INTRAVENOUS | Status: DC | PRN
  Administered 2015-04-07: 40 mg via INTRAVENOUS

## 2015-04-07 MED ORDER — MIDAZOLAM HCL 2 MG/2ML IJ SOLN: 0.5000 mg | Freq: Once | INTRAMUSCULAR | Status: DC | PRN

## 2015-04-07 MED ORDER — FENTANYL CITRATE (PF) 100 MCG/2ML IJ SOLN
INTRAMUSCULAR | Status: AC
  Filled 2015-04-07: qty 4

## 2015-04-07 MED ORDER — CHLORHEXIDINE GLUCONATE 4 % EX LIQD: 1.0000 "application " | Freq: Once | CUTANEOUS | Status: DC

## 2015-04-07 MED ORDER — HYDROCODONE-ACETAMINOPHEN 5-325 MG PO TABS: 1.0000 | ORAL_TABLET | Freq: Four times a day (QID) | ORAL | Status: DC | PRN

## 2015-04-07 MED ORDER — PROMETHAZINE HCL 25 MG/ML IJ SOLN
INTRAMUSCULAR | Status: AC
  Filled 2015-04-07: qty 1

## 2015-04-07 MED ORDER — MIDAZOLAM HCL 2 MG/2ML IJ SOLN
INTRAMUSCULAR | Status: AC
  Filled 2015-04-07: qty 2

## 2015-04-07 MED ORDER — PROPOFOL 10 MG/ML IV BOLUS
INTRAVENOUS | Status: DC | PRN
  Administered 2015-04-07: 200 mg via INTRAVENOUS

## 2015-04-07 SURGICAL SUPPLY — 45 items
APPLIER CLIP 9.375 MED OPEN (MISCELLANEOUS) ×2
BINDER BREAST LRG (GAUZE/BANDAGES/DRESSINGS) IMPLANT
BINDER BREAST MEDIUM (GAUZE/BANDAGES/DRESSINGS) IMPLANT
BINDER BREAST XLRG (GAUZE/BANDAGES/DRESSINGS) ×2 IMPLANT
BINDER BREAST XXLRG (GAUZE/BANDAGES/DRESSINGS) IMPLANT
BLADE SURG 15 STRL LF DISP TIS (BLADE) ×1 IMPLANT
BLADE SURG 15 STRL SS (BLADE) ×1
CANISTER SUC SOCK COL 7IN (MISCELLANEOUS) ×2 IMPLANT
CANISTER SUCT 1200ML W/VALVE (MISCELLANEOUS) IMPLANT
CHLORAPREP W/TINT 26ML (MISCELLANEOUS) ×2 IMPLANT
CLIP APPLIE 9.375 MED OPEN (MISCELLANEOUS) ×1 IMPLANT
CLIP TI WIDE RED SMALL 6 (CLIP) ×2 IMPLANT
COVER BACK TABLE 60X90IN (DRAPES) ×2 IMPLANT
COVER MAYO STAND STRL (DRAPES) ×2 IMPLANT
COVER PROBE W GEL 5X96 (DRAPES) ×2 IMPLANT
DECANTER SPIKE VIAL GLASS SM (MISCELLANEOUS) IMPLANT
DEVICE DUBIN W/COMP PLATE 8390 (MISCELLANEOUS) ×2 IMPLANT
DRAPE LAPAROSCOPIC ABDOMINAL (DRAPES) IMPLANT
DRAPE LAPAROTOMY 100X72 PEDS (DRAPES) ×2 IMPLANT
DRAPE UTILITY XL STRL (DRAPES) ×2 IMPLANT
ELECT COATED BLADE 2.86 ST (ELECTRODE) ×2 IMPLANT
ELECT REM PT RETURN 9FT ADLT (ELECTROSURGICAL) ×2
ELECTRODE REM PT RTRN 9FT ADLT (ELECTROSURGICAL) ×1 IMPLANT
GLOVE BIOGEL PI IND STRL 8 (GLOVE) ×1 IMPLANT
GLOVE BIOGEL PI INDICATOR 8 (GLOVE) ×1
GLOVE ECLIPSE 8.0 STRL XLNG CF (GLOVE) ×2 IMPLANT
GOWN STRL REUS W/ TWL LRG LVL3 (GOWN DISPOSABLE) ×2 IMPLANT
GOWN STRL REUS W/TWL LRG LVL3 (GOWN DISPOSABLE) ×2
HEMOSTAT SNOW SURGICEL 2X4 (HEMOSTASIS) IMPLANT
KIT MARKER MARGIN INK (KITS) ×2 IMPLANT
LIQUID BAND (GAUZE/BANDAGES/DRESSINGS) ×2 IMPLANT
NEEDLE HYPO 25X1 1.5 SAFETY (NEEDLE) ×2 IMPLANT
NS IRRIG 1000ML POUR BTL (IV SOLUTION) ×2 IMPLANT
PACK BASIN DAY SURGERY FS (CUSTOM PROCEDURE TRAY) ×2 IMPLANT
PENCIL BUTTON HOLSTER BLD 10FT (ELECTRODE) ×2 IMPLANT
SLEEVE SCD COMPRESS KNEE MED (MISCELLANEOUS) ×2 IMPLANT
SPONGE LAP 4X18 X RAY DECT (DISPOSABLE) ×2 IMPLANT
SUT MNCRL AB 4-0 PS2 18 (SUTURE) ×2 IMPLANT
SUT SILK 2 0 SH (SUTURE) IMPLANT
SUT VICRYL 3-0 CR8 SH (SUTURE) ×2 IMPLANT
SYR CONTROL 10ML LL (SYRINGE) ×2 IMPLANT
TOWEL OR 17X24 6PK STRL BLUE (TOWEL DISPOSABLE) ×2 IMPLANT
TOWEL OR NON WOVEN STRL DISP B (DISPOSABLE) ×2 IMPLANT
TUBE CONNECTING 20X1/4 (TUBING) ×2 IMPLANT
YANKAUER SUCT BULB TIP NO VENT (SUCTIONS) ×2 IMPLANT

## 2015-04-07 NOTE — H&P (View-Only) (Signed)
Patient words: RIGHT DCIS    pt sent at the request of Dr Monique Watson for right breast microcalcifications core bx to be DCIS. MRI shows area to 5 mmx 8 mm. Hx BRCA 2 and had left breast conservation in 2010. Monique Watson had a port since 2008 due to poor venous access. Denies breast pain or nipple discharge.         ADDITIONAL INFORMATION: PROGNOSTIC INDICATORS - ACIS Results: IMMUNOHISTOCHEMICAL AND MORPHOMETRIC ANALYSIS BY THE AUTOMATED CELLULAR IMAGING SYSTEM (ACIS) Estrogen Receptor: 95%, POSITIVE, STRONG STAINING INTENSITY (PERFORMED MANUALLY) Progesterone Receptor: 0%, NEGATIVE (PERFORMED MANUALLY) COMMENT: The negative hormone receptor study(ies) in this case has no internal positive control. REFERENCE RANGE ESTROGEN RECEPTOR NEGATIVE <1% POSITIVE =>1% PROGESTERONE RECEPTOR NEGATIVE <1% POSITIVE =>1% All controls stained appropriately Monique Watson Pathologist, Electronic Signature ( Signed 03/02/2015) FINAL DIAGNOSIS Diagnosis Breast, right, needle core biopsy, lower inner quadrant - DUCTAL CARCINOMA IN SITU WITH COMEDONECROSIS. - SEE COMMENT. 1 of 2 FINAL for Monique Watson, Monique Watson [phone]) Microscopic Comment Although definitive grading of tumor is best performed on excision specimen, as sampled, the ductal carcinoma in situ appears intermediate grade. A quantitative estrogen receptor and progesterone receptor will be performed and reported in an addendum to follow. Dr. Lyndon Watson has seen this case in consultation with agreement. The findings are called to the [place] on 02/25/15. (RAH:gt, 02/25/15) Monique Watson Pathologist, Electronic Signature (Case signed 02/25/2015) Specimen Gross and Clinical Information Specimen Comment Formalin 8:20; mass seen w/screening mri brca+ Specimen(Watson) Obtained: Breast, right, needle core biopsy, lower inner quadrant Specimen Clinical Information R/O [facility] or B9 lesion Gross Received in formalin is a 2.8 x 2.5 x 0.5  cm aggregate of soft, yellow white fibrofatty tissue. All tissue is submitted in two blocks. (TB:gt, 02/24/15) Stain(Watson) used in Diagnosis: The following stain(Watson) were used in diagnosing the case: PR-ACIS, ER-ACIS. The control(Watson) stained appropriately. Disclaimer Estrogen receptor (SP1), immunohistochemical stains are performed on formalin fixed, paraffin embedded tissue using a 3,3"-diaminobenzidine (DAB) chromogen and DAKO Autostainer System. The staining intensity of the nucleus is scored morphometrically using the Automated Cellular Imaging System (ACIS) and is reported as the percentage of tumor cell nuclei demonstrating specific nuclear staining. PR progesterone receptor (PgR 636), immunohistochemical stains are performed on formalin fixed, paraffin embedded tissue using a 3,3"-diaminobenzidine (DAB) chromogen and DAKO Autostainer System. The staining intensity of the nucleus is scored morphometrically using the Automated Cellular Imaging System (ACIS) and is reported as the percentage of tumor cell nuclei demonstrating specific nuclear staining. Report signed out from the following location(Watson) Technical Component was performed at Monique Watson. Monique Watson Monique Watson,Monique Watson,,Monique Watson [id number].FGHW:[id number]:[id number]., Technical component and interpretation was performed at Monique Watson, Mon[place], [place] [address]. CLIA #: [id number], 2 of 2       The breast cancer was diagnosed in March 2010, multifocal T1N0 at lumpectomy and sentinel node evaluation, ER/PR positive and HER 2 negative. She had local radiation, was intolerant to Femara and Aromasin due to severe arthralgias, and has been on tamoxifen since Aug 2011, planned for at least 5 years. Last bilateral mammograms were at [facility] 12-09-13 and last breast MRI was 01-2013, without findings of concern in breasts or nodal areas.  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 Surgery was TAH/BSO/omentectomy and radical complete debulking including anterior rectal resection  with anastomosis. She had 6 cycles of adjuvant taxol/ carboplatin from 04-18-2005 thru 08-14-2005. She is followed yearly by gyn oncology She was found to have BRCA 2 mutation by testing 08-2014.   CLINICAL DATA: Personal history of left breast cancer status post lumpectomy 2010. Patient is BRCA 2 positive. Screening. LABS: BUN and creatinine were obtained on site at [place] at [address] Results: BUN 16 mg/dL, Creatinine 1.2 mg/dL, GFR 51. EXAM: BILATERAL BREAST MRI WITH AND WITHOUT CONTRAST TECHNIQUE: Multiplanar, multisequence MR images of both breasts were obtained prior to and following the intravenous administration of 19 ml of MultiHance. THREE-DIMENSIONAL MR IMAGE RENDERING ON INDEPENDENT WORKSTATION: Three-dimensional MR images were rendered by post-processing of the original MR data on an independent workstation. The three-dimensional MR images were interpreted, and findings are reported in the following complete MRI report for this study. Three dimensional images were evaluated at the independent DynaCad workstation COMPARISON: Previous exam(Watson) including MRI of the breast dated Jan 24, 2012. FINDINGS: Breast composition: b. Scattered fibroglandular tissue. Background parenchymal enhancement: Moderate. Right breast: There is a 5 x 9 x 8 mm mass enhancement at the slight lower slight medial right breast anterior to middle depth with plateau enhancement kinetics. Left breast: No mass or abnormal enhancement. Post lumpectomy changes are noted. Lymph nodes: No abnormal appearing lymph nodes. Ancillary findings: None. IMPRESSION: Suspicious findings. RECOMMENDATION: MRI guided core biopsy of right breast  enhancing mass. BI-RADS CATEGORY 4: Suspicious. Electronically Signed By: Monique Watson M.D. On: 01/27/2015 13:41                    ADDITIONAL INFORMATION: PROGNOSTIC INDICATORS - ACIS Results: IMMUNOHISTOCHEMICAL AND MORPHOMETRIC ANALYSIS BY THE AUTOMATED CELLULAR IMAGING SYSTEM (ACIS) Estrogen Receptor: 95%, POSITIVE, STRONG STAINING INTENSITY (PERFORMED MANUALLY) Progesterone Receptor: 0%, NEGATIVE (PERFORMED MANUALLY) COMMENT: The negative hormone receptor study(ies) in this case has no internal positive control. REFERENCE RANGE ESTROGEN RECEPTOR NEGATIVE <1% POSITIVE =>1% PROGESTERONE RECEPTOR NEGATIVE <1% POSITIVE =>1% All controls stained appropriately Monique Watson Pathologist, Electronic Signature ( Signed 03/02/2015) FINAL DIAGNOSIS Diagnosis Breast, right, needle core biopsy, lower inner quadrant - DUCTAL CARCINOMA IN SITU WITH COMEDONECROSIS. - SEE COMMENT. 1 of 2 FINAL for Monique Watson, Monique Watson [phone]) Microscopic Comment Although definitive grading of tumor is best performed on excision specimen, as sampled, the ductal carcinoma in situ appears intermediate grade. A quantitative estrogen receptor and progesterone receptor will be performed and reported in an addendum to follow. Dr. Lyndon Watson has seen this case in consultation with agreement. The findings are called to the [place] on 02/25/15. (RAH:gt, 02/25/15) Monique Watson Pathologist, Electronic Signature (Case signed 06/.  The patient is a 74 year old female   Allergies Elbert Ewings, CMA; 03/08/2015 3:00 PM) Arimidex *ANTINEOPLASTICS AND ADJUNCTIVE THERAPIES* Diovan *ANTIHYPERTENSIVES*  Medication History Elbert Ewings, CMA; 03/08/2015 3:04 PM) Tamoxifen Citrate (20MG Tablet, Oral) Active. Albuterol Sulfate (108 (90 Base)MCG/ACT Aero Pow Br Act, Inhalation) Active. Calcium Carbonate (600MG Tablet, Oral) Active. Vitamin D3 (1000UNIT Tablet, Oral)  Active. Colace (100MG Capsule, Oral) Active. Ferrous Sulfate CR (160 (50 Fe)MG Tablet ER, Oral) Active. Omega 3 (1000MG Capsule, Oral) Active. Potassium (99MG Tablet, Oral) Active. Vitamin E (400UNIT Capsule, Oral) Active. Medications Reconciled    Vitals Elbert Ewings CMA; 03/08/2015 3:04 PM) 03/08/2015 3:04 PM Weight: 193 lb Height: 66in Body Surface Area: 2.02 m Body Mass Index: 31.15 kg/m Temp.: 97.61F(Oral)  Pulse: 66 (Regular)  Resp.: 17 (Unlabored)  BP: 130/66 (Sitting, Left Arm, Standard)  Physical Exam (Dezhane Staten A. Dmarius Reeder Watson; 03/08/2015 4:03 PM)  General Mental Status-Alert. General Appearance-Consistent with stated age. Hydration-Well hydrated. Voice-Normal.  Eye Eyeball - Bilateral-Extraocular movements intact. Sclera/Conjunctiva - Bilateral-No scleral icterus.  Chest and Lung Exam Chest and lung exam reveals -quiet, even and easy respiratory effort with no use of accessory muscles and on auscultation, normal breath sounds, no adventitious sounds and normal vocal resonance. Inspection Chest Wall - Normal. Back - normal.  Breast Note: left breast post surgical changes noted smaller right breast withpost bx changes no mass   Cardiovascular Cardiovascular examination reveals -normal heart sounds, regular rate and rhythm with no murmurs and normal pedal pulses bilaterally.  Lymphatic Axillary  General Axillary Region: Bilateral - Description - Normal. Tenderness - Non Tender.    Assessment & Plan (Katelen Luepke A. Purcell Jungbluth Watson; 03/08/2015 3:33 PM)  BREAST NEOPLASM, TIS (DCIS), RIGHT (233.0  D05.11) Impression: DISCUSSED MASTECTOMY AND PARTIAL MASTECTOMY WITH PATIENT AND FAMILY. PT DESIRES RIGHT PARTIAL MASTECTOMY / LUMPECTOMY RIGHT Risk of lumpectomy include bleeding, infection, seroma, more surgery, use of seed/wire, wound care, cosmetic deformity and the need for other treatments, death , blood clots, death. Pt agrees to  proceed.  Current Plans Pt Education - Ductal carcinoma in situ: Treatment and prognosis: discussed with patient and provided information. Pt Education - [facility] Breast Biopsy HCI BRCA2 POSITIVE (V84.01  Z15.01)

## 2015-04-07 NOTE — Anesthesia Preprocedure Evaluation (Addendum)
Anesthesia Evaluation  Patient identified by MRN, date of birth, ID band Patient awake    Reviewed: Allergy & Precautions, NPO status , Patient's Chart, lab work & pertinent test results  History of Anesthesia Complications Negative for: history of anesthetic complications  Airway Mallampati: I  TM Distance: >3 FB Neck ROM: Full    Dental  (+) Edentulous Upper, Partial Lower   Pulmonary shortness of breath, COPDformer smoker,  breath sounds clear to auscultation        Cardiovascular hypertension (not on medication), - anginaRhythm:Regular Rate:Normal  '15 Stress test normal. EF normal. No ischemia    Neuro/Psych negative neurological ROS     GI/Hepatic negative GI ROS, Neg liver ROS,   Endo/Other  Morbid obesity  Renal/GU negative Renal ROS     Musculoskeletal  (+) Arthritis -, Osteoarthritis,    Abdominal (+) + obese,   Peds  Hematology  (+) Blood dyscrasia (plt 100k), ,   Anesthesia Other Findings Breast cancer: surgery, radiation Ovarian cancer: chemo  Reproductive/Obstetrics                            Anesthesia Physical Anesthesia Plan  ASA: III  Anesthesia Plan: General   Post-op Pain Management:    Induction: Intravenous  Airway Management Planned: LMA  Additional Equipment:   Intra-op Plan:   Post-operative Plan:   Informed Consent: I have reviewed the patients History and Physical, chart, labs and discussed the procedure including the risks, benefits and alternatives for the proposed anesthesia with the patient or authorized representative who has indicated his/her understanding and acceptance.   Dental advisory given  Plan Discussed with: CRNA and Surgeon  Anesthesia Plan Comments: (Plan routine monitors, GA- LMA OK)        Anesthesia Quick Evaluation

## 2015-04-07 NOTE — Anesthesia Procedure Notes (Signed)
Procedure Name: LMA Insertion Date/Time: 04/07/2015 9:42 AM Performed by: Maryella Shivers Pre-anesthesia Checklist: Patient identified, Emergency Drugs available, Suction available and Patient being monitored Patient Re-evaluated:Patient Re-evaluated prior to inductionOxygen Delivery Method: Circle System Utilized Preoxygenation: Pre-oxygenation with 100% oxygen Intubation Type: IV induction Ventilation: Mask ventilation without difficulty LMA: LMA inserted LMA Size: 4.0 Number of attempts: 1 Airway Equipment and Method: Bite block Placement Confirmation: positive ETCO2 Tube secured with: Tape Dental Injury: Teeth and Oropharynx as per pre-operative assessment

## 2015-04-07 NOTE — Op Note (Signed)
Preoperative diagnosis: Right breast DCIS central  Postoperative diagnosis: Same  Procedure: Right breast seed localized partial mastectomy  Surgeon: Erroll Luna M.D.  Anesthesia: LMA with 0.25% Sensorcaine local with epinephrine  EBL: Minimal  Specimen: Right breast tissue with seed and biopsy clips within specimen verified by radiograph confirmed by pathology  Drains: None  Indications for procedure: The patient presents for right breast partial mastectomy due to a mammographic abnormality in the right central breast core biopsy proven to the DCIS. She opted for breast conservation after discussion of all of her options including mastectomy. She is BRCA 2 positive and is aware of her options of bilateral mastectomy if desired. Given her advanced age, she opted for partial mastectomy on the right.The procedure has been discussed with the patient. Alternatives to surgery have been discussed with the patient.  Risks of surgery include bleeding,  Infection,  Seroma formation, death,  and the need for further surgery.   The patient understands and wishes to proceed.  Description of procedure: The patient was met in the holding area and the right breast was marked as the correct side with the use of the neoprobe to verify active seed. Questions are answered. She was taken back to the operating room and placed supine on the table. LMA anesthesia was initiated and timeout was done. Right breast is prepped and draped in a sterile fashion. Neoprobe was used to verify location of seed in the right central breast slightly inferomedial to the edge of the nipple. Curvilinear incision made along the border of the nipple for 4 cm. Neoprobe used an area localized and excised in its entirety grossly negative margins. Specimen radiograph revealed the seating clips to be present. Hemostasis achieved M cavity marked with clips. Wound closed with 3-0 Vicryl and 4-0 Monocryl. Dermabond applied. All final counts  found to be correct patient awoke and extubated taken to recovery in satisfactory condition.

## 2015-04-07 NOTE — Anesthesia Postprocedure Evaluation (Signed)
  Anesthesia Post-op Note  Patient: Monique Watson  Procedure(s) Performed: Procedure(s): BREAST LUMPECTOMY WITH RADIOACTIVE SEED LOCALIZATION (Right)  Patient Location: PACU  Anesthesia Type:General  Level of Consciousness: awake, alert , oriented and patient cooperative  Airway and Oxygen Therapy: Patient Spontanous Breathing  Post-op Pain: none  Post-op Assessment: Post-op Vital signs reviewed, Patient's Cardiovascular Status Stable, Respiratory Function Stable, Patent Airway, No signs of Nausea or vomiting and Pain level controlled              Post-op Vital Signs: Reviewed and stable  Last Vitals:  Filed Vitals:   04/07/15 1315  BP: 167/77  Pulse: 60  Temp: 36.8 C  Resp: 16    Complications: No apparent anesthesia complications

## 2015-04-07 NOTE — Discharge Instructions (Signed)
[facility] °Office Phone Number [phone] ° °BREAST BIOPSY/ PARTIAL MASTECTOMY: POST OP INSTRUCTIONS ° °Always review your discharge instruction sheet given to you by the facility where your surgery was performed. ° °IF YOU HAVE DISABILITY OR FAMILY LEAVE FORMS, YOU MUST BRING THEM TO THE OFFICE FOR PROCESSING.  DO NOT GIVE THEM TO YOUR DOCTOR. ° °1. A prescription for pain medication may be given to you upon discharge.  Take your pain medication as prescribed, if needed.  If narcotic pain medicine is not needed, then you may take acetaminophen (Tylenol) or ibuprofen (Advil) as needed. °2. Take your usually prescribed medications unless otherwise directed °3. If you need a refill on your pain medication, please contact your pharmacy.  They will contact our office to request authorization.  Prescriptions will not be filled after 5pm or on week-ends. °4. You should eat very light the first 24 hours after surgery, such as soup, crackers, pudding, etc.  Resume your normal diet the day after surgery. °5. Most patients will experience some swelling and bruising in the breast.  Ice packs and a good support bra will help.  Swelling and bruising can take several days to resolve.  °6. It is common to experience some constipation if taking pain medication after surgery.  Increasing fluid intake and taking a stool softener will usually help or prevent this problem from occurring.  A mild laxative (Milk of Magnesia or Miralax) should be taken according to package directions if there are no bowel movements after 48 hours. °7. Unless discharge instructions indicate otherwise, you may remove your bandages 24-48 hours after surgery, and you may shower at that time.  You may have steri-strips (small skin tapes) in place directly over the incision.  These strips should be left on the skin for 7-10 days.  If your surgeon used skin glue on the incision, you may shower in 24 hours.  The glue will flake off over the  next 2-3 weeks.  Any sutures or staples will be removed at the office during your follow-up visit. °8. ACTIVITIES:  You may resume regular daily activities (gradually increasing) beginning the next day.  Wearing a good support bra or sports bra minimizes pain and swelling.  You may have sexual intercourse when it is comfortable. °a. You may drive when you no longer are taking prescription pain medication, you can comfortably wear a seatbelt, and you can safely maneuver your car and apply brakes. °b. RETURN TO WORK:  ______________________________________________________________________________________ °9. You should see your doctor in the office for a follow-up appointment approximately two weeks after your surgery.  Your doctor’s nurse will typically make your follow-up appointment when she calls you with your pathology report.  Expect your pathology report 2-3 business days after your surgery.  You may call to check if you do not hear from us after three days. °10. OTHER INSTRUCTIONS: _______________________________________________________________________________________________ _____________________________________________________________________________________________________________________________________ °_____________________________________________________________________________________________________________________________________ °_____________________________________________________________________________________________________________________________________ ° °WHEN TO CALL YOUR DOCTOR: °1. Fever over 101.0 °2. Nausea and/or vomiting. °3. Extreme swelling or bruising. °4. Continued bleeding from incision. °5. Increased pain, redness, or drainage from the incision. ° °The clinic staff is available to answer your questions during regular business hours.  Please don’t hesitate to call and ask to speak to one of the nurses for clinical concerns.  If you have a medical emergency, go to the nearest  emergency room or call 911.  A surgeon from [facility] is always on call at the hospital. ° °For further questions, please visit [email or web address]  ° ° ° °  Post Anesthesia Home Care Instructions ° °Activity: °Get plenty of rest for the remainder of the day. A responsible adult should stay with you for 24 hours following the procedure.  °For the next 24 hours, DO NOT: °-Drive a car °-Operate machinery °-Drink alcoholic beverages °-Take any medication unless instructed by your physician °-Make any legal decisions or sign important papers. ° °Meals: °Start with liquid foods such as gelatin or soup. Progress to regular foods as tolerated. Avoid greasy, spicy, heavy foods. If nausea and/or vomiting occur, drink only clear liquids until the nausea and/or vomiting subsides. Call your physician if vomiting continues. ° °Special Instructions/Symptoms: °Your throat may feel dry or sore from the anesthesia or the breathing tube placed in your throat during surgery. If this causes discomfort, gargle with warm salt water. The discomfort should disappear within 24 hours. ° °If you had a scopolamine patch placed behind your ear for the management of post- operative nausea and/or vomiting: ° °1. The medication in the patch is effective for 72 hours, after which it should be removed.  Wrap patch in a tissue and discard in the trash. Wash hands thoroughly with soap and water. °2. You may remove the patch earlier than 72 hours if you experience unpleasant side effects which may include dry mouth, dizziness or visual disturbances. °3. Avoid touching the patch. Wash your hands with soap and water after contact with the patch. °  ° °

## 2015-04-07 NOTE — Transfer of Care (Signed)
Immediate Anesthesia Transfer of Care Note  Patient: Monique Watson  Procedure(s) Performed: Procedure(s): BREAST LUMPECTOMY WITH RADIOACTIVE SEED LOCALIZATION (Right)  Patient Location: PACU  Anesthesia Type:General  Level of Consciousness: sedated  Airway & Oxygen Therapy: Patient Spontanous Breathing and Patient connected to face mask oxygen  Post-op Assessment: Report given to RN and Post -op Vital signs reviewed and stable  Post vital signs: Reviewed and stable  Last Vitals:  Filed Vitals:   04/07/15 0844  BP: 161/87  Pulse: 79  Temp: 36.7 C  Resp: 20    Complications: No apparent anesthesia complications

## 2015-04-07 NOTE — Interval H&P Note (Signed)
History and Physical Interval Note:  04/07/2015 9:17 AM  Monique Watson  has presented today for surgery, with the diagnosis of Right Breast Cancer  The various methods of treatment have been discussed with the patient and family. After consideration of risks, benefits and other options for treatment, the patient has consented to  Procedure(s): BREAST LUMPECTOMY WITH RADIOACTIVE SEED LOCALIZATION (Right) as a surgical intervention .  The patient's history has been reviewed, patient examined, no change in status, stable for surgery.  I have reviewed the patient's chart and labs.  Questions were answered to the patient's satisfaction.     Ericah Scotto A.

## 2015-04-08 ENCOUNTER — Encounter (HOSPITAL_BASED_OUTPATIENT_CLINIC_OR_DEPARTMENT_OTHER): Payer: Self-pay | Admitting: Surgery

## 2015-04-15 ENCOUNTER — Other Ambulatory Visit: Payer: Self-pay | Admitting: Oncology

## 2015-04-15 ENCOUNTER — Telehealth: Payer: Self-pay | Admitting: Oncology

## 2015-04-15 DIAGNOSIS — C569 Malignant neoplasm of unspecified ovary: Secondary | ICD-10-CM

## 2015-04-15 DIAGNOSIS — C50919 Malignant neoplasm of unspecified site of unspecified female breast: Secondary | ICD-10-CM

## 2015-04-15 NOTE — Telephone Encounter (Signed)
Called adn left a message with her 05/2015 appointment

## 2015-04-19 ENCOUNTER — Encounter: Payer: Self-pay | Admitting: Radiation Oncology

## 2015-04-19 ENCOUNTER — Ambulatory Visit
Admission: RE | Admit: 2015-04-19 | Discharge: 2015-04-19 | Disposition: A | Discharge status: Home / Self Care | Payer: Medicare Other | Source: Ambulatory Visit | Attending: Radiation Oncology | Admitting: Radiation Oncology

## 2015-04-19 DIAGNOSIS — D0511 Intraductal carcinoma in situ of right breast: Secondary | ICD-10-CM | POA: Diagnosis not present

## 2015-04-19 DIAGNOSIS — Z8041 Family history of malignant neoplasm of ovary: Secondary | ICD-10-CM | POA: Diagnosis not present

## 2015-04-19 DIAGNOSIS — Z800 Family history of malignant neoplasm of digestive organs: Secondary | ICD-10-CM | POA: Diagnosis not present

## 2015-04-19 DIAGNOSIS — Z853 Personal history of malignant neoplasm of breast: Secondary | ICD-10-CM | POA: Insufficient documentation

## 2015-04-19 DIAGNOSIS — Z87891 Personal history of nicotine dependence: Secondary | ICD-10-CM | POA: Diagnosis not present

## 2015-04-19 DIAGNOSIS — Z79899 Other long term (current) drug therapy: Secondary | ICD-10-CM | POA: Insufficient documentation

## 2015-04-19 DIAGNOSIS — Z803 Family history of malignant neoplasm of breast: Secondary | ICD-10-CM | POA: Diagnosis not present

## 2015-04-19 DIAGNOSIS — D0591 Unspecified type of carcinoma in situ of right breast: Secondary | ICD-10-CM

## 2015-04-19 DIAGNOSIS — C50311 Malignant neoplasm of lower-inner quadrant of right female breast: Secondary | ICD-10-CM | POA: Diagnosis not present

## 2015-04-19 DIAGNOSIS — Z923 Personal history of irradiation: Secondary | ICD-10-CM | POA: Diagnosis not present

## 2015-04-19 DIAGNOSIS — Z510 Encounter for antineoplastic radiation therapy: Secondary | ICD-10-CM | POA: Diagnosis not present

## 2015-04-19 DIAGNOSIS — R42 Dizziness and giddiness: Secondary | ICD-10-CM | POA: Diagnosis not present

## 2015-04-19 DIAGNOSIS — I10 Essential (primary) hypertension: Secondary | ICD-10-CM | POA: Insufficient documentation

## 2015-04-19 DIAGNOSIS — Z170 Estrogen receptor positive status [ER+]: Secondary | ICD-10-CM | POA: Insufficient documentation

## 2015-04-19 DIAGNOSIS — Z8543 Personal history of malignant neoplasm of ovary: Secondary | ICD-10-CM | POA: Insufficient documentation

## 2015-04-19 HISTORY — DX: Reserved for inherently not codable concepts without codable children: IMO0001

## 2015-04-19 HISTORY — DX: Reserved for concepts with insufficient information to code with codable children: IMO0002

## 2015-04-19 NOTE — Progress Notes (Signed)
Location of Breast Cancer: right breast cancer  Histology per Pathology Report:   04/07/15 Diagnosis Breast, lumpectomy, right - INTERMEDIATE GRADE DUCTAL CARCINOMA IN SITU WITH NECROSIS. - TUMOR IS EXTREMELY CLOSE TO LATERAL MARGIN (LESS THAN 0.1 CM), INCLUDING LATERAL MARGIN NEAR ANTERIOR ASPECT. - OTHER MARGINS ARE NEGATIVE. - SEE ONCOLOGY TEMPLATE.  02/24/15 Diagnosis Breast, right, needle core biopsy, lower inner quadrant - DUCTAL CARCINOMA IN SITU WITH COMEDONECROSIS. - SEE COMMENT.  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  Follows up with Dr. [name].   Lymphedema issues, if any:  NO  Pain issues, if any:  NO  SAFETY ISSUES:  Prior radiation? yes  Pacemaker/ICD? no  Possible current pregnancy?no  Is the patient on methotrexate? no  Current Complaints / other details:  74 year old female. Tx by Dr. Lisbeth Renshaw in 2010 for left breast ca. Reports dizziness when standing or sitting x approximately 3 months. Denies headache, nausea or vomiting. Reports hot flashes continue. Confirms nodule was found on mammogram. Continues Nolvadex. Scheduled to follow up with [facility] on 9/8.

## 2015-04-19 NOTE — Progress Notes (Signed)
See progress note under physician encounter. 

## 2015-04-19 NOTE — Progress Notes (Signed)
Radiation Oncology         [phone] ________________________________  Name: Monique Watson MRN: [id number]  Date: 04/19/2015  DOB: 1941/06/02  HT:XHFSFS,ELTRVUYEB STEWART, MD  Erroll Luna, MD     REFERRING PHYSICIAN: Erroll Luna, MD   DIAGNOSIS: The encounter diagnosis was Breast cancer of lower-inner quadrant of right female breast.  Breast cancer   Staging form: Breast, AJCC 7th Edition     Pathologic: T1N0 (multifocal) - Unsigned Ovarian cancer   Staging form: Ovary, AJCC 7th Edition     Pathologic: IIIC - Unsigned  HISTORY OF PRESENT ILLNESS::Monique Watson is a 74 y.o. female who is seen for an initial consultation visit regarding the patient's diagnosis of DCIS of the right breast. The patient has history of left breast cancer (09/29/11, s/p lumpectomy) and ovarian cancer. A nodule was found in lower-inner quadrant on screening mammogram with no prior symptoms. A diagnostic mammogram confirmed this finding and an MRI guided biopsy of this area was performed. This returned positive for intermediate grade DCIS with comedonecrosis. Receptors studies have been performed and the tumor is estrogen receptor positive and progesterone receptor negative. HER-2: pending. Patient is BRCA-2 positive.   The patient has undergone an MRI scan of the breasts. This demonstrated a 9 mm mass enhancement at the lower slight medial right breast anterior to middle depth.   The patient underwent a lumpectomy on 04/07/15. This revealed DCIS measuring 1.1 cm. The margins were negative but extremely close to lateral margin (< 0.1 cm) . Necrosis was present. The grade of the tumor was T1N0. No invasive cancer was found.   Reports dizziness when standing or sitting x approximately 3 months. Denies headache, nausea or vomiting. Reports hot flashes continue. Confirms nodule was found on mammogram. Continues Nolvadex. Scheduled to follow up with [facility] on 9/8. Scheduled to see surgeon on 05/16/15.     PREVIOUS RADIATION THERAPY: Yes, treated back in 2013 to the left breast with hormonal blockade subsequently   PAST MEDICAL HISTORY:  has a past medical history of Breast cancer (09/29/2011); Ovarian cancer (09/29/2011); Arthritis; Pneumonia; Hypertension; Anemia; and Radiation (01/14/2009-02/10/2009).     PAST SURGICAL HISTORY: Past Surgical History  Procedure Laterality Date  . Abdominal hysterectomy    . Breast lumpectomy    . Tonsillectomy    . Appendectomy    . Bladder surgery    . Breast lumpectomy with radioactive seed localization Right 04/07/2015    Procedure: BREAST LUMPECTOMY WITH RADIOACTIVE SEED LOCALIZATION;  Surgeon: Erroll Luna, MD;  Location: [place];  Service: General;  Laterality: Right;     FAMILY HISTORY: family history includes Breast cancer in her paternal aunt; Cancer in her cousin; Colon cancer in her paternal aunt; Colon cancer (age of onset: 11) in her brother; Ovarian cancer (age of onset: 30) in her mother; Pancreatitis (age of onset: 63) in her brother.   SOCIAL HISTORY:  reports that she has quit smoking. She has never used smokeless tobacco. She reports that she drinks about 0.6 oz of alcohol per week. She reports that she does not use illicit drugs.   ALLERGIES: Arimidex; Diovan; and Uncoded nonscreenable allergen   MEDICATIONS:  Current Outpatient Prescriptions  Medication Sig Dispense Refill  . Cholecalciferol (VITAMIN D3) 2000 UNITS TABS Take 1 capsule by mouth once a week.     . docusate sodium (COLACE) 100 MG capsule Take 100 mg by mouth daily.    . ferrous fumarate (HEMOCYTE - 106 MG FE) 325 (106 FE)  MG TABS Take 1 tablet by mouth every 7 (seven) days. No certain day    . Omega-3 Fatty Acids (FISH OIL CONCENTRATE) 1000 MG CAPS Take 1,000 mg by mouth daily.     . Potassium 99 MG TABS Take 2 tablets by mouth daily.     . tamoxifen (NOLVADEX) 20 MG tablet Take 1 tablet (20 mg total) by mouth daily. 90 tablet 1  . vitamin  E (VITAMIN E) 400 UNIT capsule Take 400 Units by mouth 2 (two) times daily.     No current facility-administered medications for this encounter.     REVIEW OF SYSTEMS:  A 15 point review of systems is documented in the electronic medical record. This was obtained by the nursing staff. However, I reviewed this with the patient to discuss relevant findings and make appropriate changes.  Pertinent items are noted in HPI.    PHYSICAL EXAM:  height is _0  (1.676 m) and weight is 195 lb (88.451 kg). Her oral temperature is 98 F (36.7 C). Her blood pressure is 184/80 and her pulse is 71. Her respiration is 16 and oxygen saturation is 100%.    ECOG = 0  0 - Asymptomatic (Fully active, able to carry on all predisease activities without restriction)  1 - Symptomatic but completely ambulatory (Restricted in physically strenuous activity but ambulatory and able to carry out work of a light or sedentary nature. For example, light housework, office work)  2 - Symptomatic, <50% in bed during the day (Ambulatory and capable of all self care but unable to carry out any work activities. Up and about more than 50% of waking hours)  3 - Symptomatic, >50% in bed, but not bedbound (Capable of only limited self-care, confined to bed or chair 50% or more of waking hours)  4 - Bedbound (Completely disabled. Cannot carry on any self-care. Totally confined to bed or chair)  5 - Death   Eustace Pen MM, Creech RH, Tormey DC, et al. [phone]). "Toxicity and response criteria of the [facility]". [place] Oncol. 5 (6): 649-55  General: Well-developed, in no acute distress HEENT: Normocephalic, atraumatic; oral cavity clear Neck: Supple without any lymphadenopathy Cardiovascular: Regular rate and rhythm Respiratory: Clear to auscultation bilaterally Breasts: Left lumpectomy scar- well healed. Right lumpectomy scar - well healed.  GI: Soft, nontender, normal bowel sounds Extremities: No edema  present Neuro: No focal deficits     LABORATORY DATA:  Lab Results  Component Value Date   WBC 5.5 04/06/2015   HGB 12.6 04/06/2015   HCT 36.7 04/06/2015   MCV 97.6 04/06/2015   PLT 100* 04/06/2015   Lab Results  Component Value Date   NA 140 04/06/2015   K 4.0 04/06/2015   CL 108 04/06/2015   CO2 28 04/06/2015   Lab Results  Component Value Date   ALT 16 04/06/2015   AST 20 04/06/2015   ALKPHOS 48 04/06/2015   BILITOT 0.8 04/06/2015      RADIOGRAPHY: No results found.     IMPRESSION:  Oncology History   Multifocal T1N0 left breast at lumpectomy and sentinel node evaluation March 2010. ER PR +, Her2 negative. Treated with RT and hormonal blockade     Breast cancer (Resolved)   09/29/2011 Initial Diagnosis Breast cancer    The patient has a recent diagnosis of DCIS of the right breast. She was felt to be a good candidate for breast conservation treatment and she proceeded with a lumpectomy on 04/07/15. The  patient is a good candidate for adjuvant radiation treatment at this time. I discussed with the patient the issue of a close margin. The patient does not appear at all interested in considering reexcision. Given the specifics of her case, I believe that her chance of local failure would be low if she does proceed with radiation treatment.   I discussed with the patient the role of adjuvant radiation treatment in this setting. We discussed the potential benefit of radiation treatment, especially with regards to local control of the patient's tumor. We also discussed the possible side effects and risks of such a treatment as well.  All of the patient's questions were answered.   The patient wishes to proceed with radiation treatment.  PLAN: The patient will be scheduled for a simulation in the near future such that we can proceed with treatment planning. I anticipate treating the patient to a dose of 50 Gy over 4 weeks.   Scheduled to follow up with [facility] on 9/8.  Scheduled to see surgeon on 05/16/15.      ________________________________   Jodelle Gross, MD, PhD   **Disclaimer: This note was dictated with voice recognition software. Similar sounding words can inadvertently be transcribed and this note may contain transcription errors which may not have been corrected upon publication of note.**  This document serves as a record of services personally performed by Kyung Rudd, MD. It was created on his behalf by Derek Mound, a trained medical scribe. The creation of this record is based on the scribe's personal observations and the provider's statements to them. This document has been checked and approved by the attending provider.

## 2015-04-20 ENCOUNTER — Encounter: Payer: Self-pay | Admitting: Radiation Oncology

## 2015-04-20 DIAGNOSIS — C50311 Malignant neoplasm of lower-inner quadrant of right female breast: Secondary | ICD-10-CM | POA: Insufficient documentation

## 2015-04-27 ENCOUNTER — Telehealth: Payer: Self-pay | Admitting: Oncology

## 2015-04-27 NOTE — Telephone Encounter (Signed)
Voicemail full. Message from nurse to confirm appointment.

## 2015-05-05 ENCOUNTER — Ambulatory Visit
Admission: RE | Admit: 2015-05-05 | Discharge: 2015-05-05 | Disposition: A | Discharge status: Home / Self Care | Payer: Medicare Other | Source: Ambulatory Visit | Attending: Radiation Oncology | Admitting: Radiation Oncology

## 2015-05-05 DIAGNOSIS — Z510 Encounter for antineoplastic radiation therapy: Secondary | ICD-10-CM | POA: Diagnosis not present

## 2015-05-05 DIAGNOSIS — Z8543 Personal history of malignant neoplasm of ovary: Secondary | ICD-10-CM | POA: Diagnosis not present

## 2015-05-05 DIAGNOSIS — Z170 Estrogen receptor positive status [ER+]: Secondary | ICD-10-CM | POA: Diagnosis not present

## 2015-05-05 DIAGNOSIS — C50311 Malignant neoplasm of lower-inner quadrant of right female breast: Secondary | ICD-10-CM

## 2015-05-05 DIAGNOSIS — Z853 Personal history of malignant neoplasm of breast: Secondary | ICD-10-CM | POA: Diagnosis not present

## 2015-05-05 DIAGNOSIS — I10 Essential (primary) hypertension: Secondary | ICD-10-CM | POA: Diagnosis not present

## 2015-05-05 DIAGNOSIS — D0511 Intraductal carcinoma in situ of right breast: Secondary | ICD-10-CM | POA: Diagnosis not present

## 2015-05-05 DIAGNOSIS — C50911 Malignant neoplasm of unspecified site of right female breast: Secondary | ICD-10-CM | POA: Diagnosis not present

## 2015-05-07 DIAGNOSIS — Z8543 Personal history of malignant neoplasm of ovary: Secondary | ICD-10-CM | POA: Diagnosis not present

## 2015-05-07 DIAGNOSIS — D0511 Intraductal carcinoma in situ of right breast: Secondary | ICD-10-CM | POA: Diagnosis not present

## 2015-05-07 DIAGNOSIS — I10 Essential (primary) hypertension: Secondary | ICD-10-CM | POA: Diagnosis not present

## 2015-05-07 DIAGNOSIS — Z510 Encounter for antineoplastic radiation therapy: Secondary | ICD-10-CM | POA: Diagnosis not present

## 2015-05-07 DIAGNOSIS — Z170 Estrogen receptor positive status [ER+]: Secondary | ICD-10-CM | POA: Diagnosis not present

## 2015-05-07 DIAGNOSIS — Z853 Personal history of malignant neoplasm of breast: Secondary | ICD-10-CM | POA: Diagnosis not present

## 2015-05-07 NOTE — Progress Notes (Signed)
  Radiation Oncology         [phone] ________________________________  Name: CAMBRYN CHARTERS MRN: [id number]  Date: 05/05/2015  DOB: [birth year]  SIMULATION AND TREATMENT PLANNING NOTE  The patient presented for simulation prior to beginning her course of radiation treatment for her diagnosis of right-sided breast cancer. The patient was placed in a supine position on a breast board. A customized vac-lock bag was constructed and this complex treatment device will be used on a daily basis during her treatment. In this fashion, a CT scan was obtained through the chest area and an isocenter was placed near the chest wall within the breast.  The patient will be planned to receive a course of radiation initially to a dose of 42.5 Gy. This will consist of a whole breast radiotherapy technique. To accomplish this, 2 customized blocks have been designed which will correspond to medial and lateral whole breast tangent fields. This treatment will be accomplished at 2.5 Gy per fraction. A forward planning technique will also be evaluated to determine if this approach improves the plan. It is anticipated that the patient will then receive a 7.5 Gy boost to the seroma cavity which has been contoured. This will be accomplished at 2.5 Gy per fraction.   This initial treatment will consist of a 3-D conformal technique. The seroma has been contoured as the primary target structure. Additionally, dose volume histograms of both this target as well as the lungs and heart will also be evaluated. Such an approach is necessary to ensure that the target area is adequately covered while the nearby critical  normal structures are adequately spared.  Plan:  The final anticipated total dose therefore will correspond to 50 Gy.    _______________________________   Jodelle Gross, MD, PhD

## 2015-05-10 DIAGNOSIS — Z853 Personal history of malignant neoplasm of breast: Secondary | ICD-10-CM | POA: Diagnosis not present

## 2015-05-10 DIAGNOSIS — I10 Essential (primary) hypertension: Secondary | ICD-10-CM | POA: Diagnosis not present

## 2015-05-10 DIAGNOSIS — C50911 Malignant neoplasm of unspecified site of right female breast: Secondary | ICD-10-CM | POA: Diagnosis not present

## 2015-05-10 DIAGNOSIS — Z510 Encounter for antineoplastic radiation therapy: Secondary | ICD-10-CM | POA: Diagnosis not present

## 2015-05-10 DIAGNOSIS — Z170 Estrogen receptor positive status [ER+]: Secondary | ICD-10-CM | POA: Diagnosis not present

## 2015-05-10 DIAGNOSIS — D0511 Intraductal carcinoma in situ of right breast: Secondary | ICD-10-CM | POA: Diagnosis not present

## 2015-05-10 DIAGNOSIS — Z8543 Personal history of malignant neoplasm of ovary: Secondary | ICD-10-CM | POA: Diagnosis not present

## 2015-05-12 ENCOUNTER — Ambulatory Visit: Payer: Medicare Other | Admitting: Radiation Oncology

## 2015-05-13 ENCOUNTER — Ambulatory Visit
Admission: RE | Admit: 2015-05-13 | Discharge: 2015-05-13 | Disposition: A | Discharge status: Home / Self Care | Payer: Medicare Other | Source: Ambulatory Visit | Attending: Radiation Oncology | Admitting: Radiation Oncology

## 2015-05-13 DIAGNOSIS — I10 Essential (primary) hypertension: Secondary | ICD-10-CM | POA: Diagnosis not present

## 2015-05-13 DIAGNOSIS — D0511 Intraductal carcinoma in situ of right breast: Secondary | ICD-10-CM | POA: Diagnosis not present

## 2015-05-13 DIAGNOSIS — Z8543 Personal history of malignant neoplasm of ovary: Secondary | ICD-10-CM | POA: Diagnosis not present

## 2015-05-13 DIAGNOSIS — Z170 Estrogen receptor positive status [ER+]: Secondary | ICD-10-CM | POA: Diagnosis not present

## 2015-05-13 DIAGNOSIS — Z510 Encounter for antineoplastic radiation therapy: Secondary | ICD-10-CM | POA: Diagnosis not present

## 2015-05-13 DIAGNOSIS — Z853 Personal history of malignant neoplasm of breast: Secondary | ICD-10-CM | POA: Diagnosis not present

## 2015-05-13 DIAGNOSIS — C50911 Malignant neoplasm of unspecified site of right female breast: Secondary | ICD-10-CM | POA: Diagnosis not present

## 2015-05-14 ENCOUNTER — Ambulatory Visit: Payer: Medicare Other

## 2015-05-14 ENCOUNTER — Encounter: Payer: Self-pay | Admitting: Radiation Oncology

## 2015-05-17 ENCOUNTER — Ambulatory Visit
Admission: RE | Admit: 2015-05-17 | Discharge: 2015-05-17 | Disposition: A | Discharge status: Home / Self Care | Payer: Medicare Other | Source: Ambulatory Visit | Attending: Radiation Oncology | Admitting: Radiation Oncology

## 2015-05-17 DIAGNOSIS — Z853 Personal history of malignant neoplasm of breast: Secondary | ICD-10-CM | POA: Diagnosis not present

## 2015-05-17 DIAGNOSIS — Z510 Encounter for antineoplastic radiation therapy: Secondary | ICD-10-CM | POA: Diagnosis not present

## 2015-05-17 DIAGNOSIS — C50911 Malignant neoplasm of unspecified site of right female breast: Secondary | ICD-10-CM | POA: Diagnosis not present

## 2015-05-17 DIAGNOSIS — D0511 Intraductal carcinoma in situ of right breast: Secondary | ICD-10-CM | POA: Diagnosis not present

## 2015-05-17 DIAGNOSIS — Z8543 Personal history of malignant neoplasm of ovary: Secondary | ICD-10-CM | POA: Diagnosis not present

## 2015-05-17 DIAGNOSIS — Z170 Estrogen receptor positive status [ER+]: Secondary | ICD-10-CM | POA: Diagnosis not present

## 2015-05-17 DIAGNOSIS — I10 Essential (primary) hypertension: Secondary | ICD-10-CM | POA: Diagnosis not present

## 2015-05-18 ENCOUNTER — Ambulatory Visit
Admission: RE | Admit: 2015-05-18 | Discharge: 2015-05-18 | Disposition: A | Discharge status: Home / Self Care | Payer: Medicare Other | Source: Ambulatory Visit | Attending: Radiation Oncology | Admitting: Radiation Oncology

## 2015-05-18 VITALS — Wt 192.3 lb

## 2015-05-18 DIAGNOSIS — Z853 Personal history of malignant neoplasm of breast: Secondary | ICD-10-CM | POA: Diagnosis not present

## 2015-05-18 DIAGNOSIS — C50311 Malignant neoplasm of lower-inner quadrant of right female breast: Secondary | ICD-10-CM

## 2015-05-18 DIAGNOSIS — Z170 Estrogen receptor positive status [ER+]: Secondary | ICD-10-CM | POA: Diagnosis not present

## 2015-05-18 DIAGNOSIS — Z510 Encounter for antineoplastic radiation therapy: Secondary | ICD-10-CM | POA: Diagnosis not present

## 2015-05-18 DIAGNOSIS — D0511 Intraductal carcinoma in situ of right breast: Secondary | ICD-10-CM | POA: Diagnosis not present

## 2015-05-18 DIAGNOSIS — I10 Essential (primary) hypertension: Secondary | ICD-10-CM | POA: Diagnosis not present

## 2015-05-18 DIAGNOSIS — Z8543 Personal history of malignant neoplasm of ovary: Secondary | ICD-10-CM | POA: Diagnosis not present

## 2015-05-18 DIAGNOSIS — C50911 Malignant neoplasm of unspecified site of right female breast: Secondary | ICD-10-CM | POA: Diagnosis not present

## 2015-05-18 MED ORDER — RADIAPLEXRX EX GEL
Freq: Once | CUTANEOUS | Status: AC
  Administered 2015-05-18: 12:00:00 via TOPICAL

## 2015-05-18 MED ORDER — ALRA NON-METALLIC DEODORANT (RAD-ONC)
1.0000 "application " | Freq: Once | TOPICAL | Status: AC
  Administered 2015-05-18: 1 via TOPICAL

## 2015-05-18 NOTE — Progress Notes (Addendum)
Patient education done radiation therapy and you book, my business card, radiaplex gel, alra deodorant and flyer sheet on skin products, discussed ways to manage side effects fatigue,skin irritation,swelling ,tenderness and pain, sees MD weekly and prn, use of skin products to use after rad txs and bedtimes daily, increase protein in diet,stay hydrated drink plenty water, eat healhier ,teach back given 12:19 PM

## 2015-05-19 ENCOUNTER — Ambulatory Visit
Admission: RE | Admit: 2015-05-19 | Discharge: 2015-05-19 | Disposition: A | Discharge status: Home / Self Care | Payer: Medicare Other | Source: Ambulatory Visit | Attending: Radiation Oncology | Admitting: Radiation Oncology

## 2015-05-19 DIAGNOSIS — C50911 Malignant neoplasm of unspecified site of right female breast: Secondary | ICD-10-CM | POA: Diagnosis not present

## 2015-05-19 DIAGNOSIS — I10 Essential (primary) hypertension: Secondary | ICD-10-CM | POA: Diagnosis not present

## 2015-05-19 DIAGNOSIS — Z853 Personal history of malignant neoplasm of breast: Secondary | ICD-10-CM | POA: Diagnosis not present

## 2015-05-19 DIAGNOSIS — Z510 Encounter for antineoplastic radiation therapy: Secondary | ICD-10-CM | POA: Diagnosis not present

## 2015-05-19 DIAGNOSIS — Z8543 Personal history of malignant neoplasm of ovary: Secondary | ICD-10-CM | POA: Diagnosis not present

## 2015-05-19 DIAGNOSIS — Z170 Estrogen receptor positive status [ER+]: Secondary | ICD-10-CM | POA: Diagnosis not present

## 2015-05-19 DIAGNOSIS — D0511 Intraductal carcinoma in situ of right breast: Secondary | ICD-10-CM | POA: Diagnosis not present

## 2015-05-20 ENCOUNTER — Ambulatory Visit
Admission: RE | Admit: 2015-05-20 | Discharge: 2015-05-20 | Disposition: A | Discharge status: Home / Self Care | Payer: Medicare Other | Source: Ambulatory Visit | Attending: Radiation Oncology | Admitting: Radiation Oncology

## 2015-05-20 DIAGNOSIS — Z510 Encounter for antineoplastic radiation therapy: Secondary | ICD-10-CM | POA: Diagnosis not present

## 2015-05-20 DIAGNOSIS — Z170 Estrogen receptor positive status [ER+]: Secondary | ICD-10-CM | POA: Diagnosis not present

## 2015-05-20 DIAGNOSIS — Z853 Personal history of malignant neoplasm of breast: Secondary | ICD-10-CM | POA: Diagnosis not present

## 2015-05-20 DIAGNOSIS — D0511 Intraductal carcinoma in situ of right breast: Secondary | ICD-10-CM | POA: Diagnosis not present

## 2015-05-20 DIAGNOSIS — C50911 Malignant neoplasm of unspecified site of right female breast: Secondary | ICD-10-CM | POA: Diagnosis not present

## 2015-05-20 DIAGNOSIS — I10 Essential (primary) hypertension: Secondary | ICD-10-CM | POA: Diagnosis not present

## 2015-05-20 DIAGNOSIS — Z8543 Personal history of malignant neoplasm of ovary: Secondary | ICD-10-CM | POA: Diagnosis not present

## 2015-05-21 ENCOUNTER — Ambulatory Visit
Admission: RE | Admit: 2015-05-21 | Discharge: 2015-05-21 | Disposition: A | Discharge status: Home / Self Care | Payer: Medicare Other | Source: Ambulatory Visit | Attending: Radiation Oncology | Admitting: Radiation Oncology

## 2015-05-21 DIAGNOSIS — C50311 Malignant neoplasm of lower-inner quadrant of right female breast: Secondary | ICD-10-CM

## 2015-05-21 DIAGNOSIS — Z510 Encounter for antineoplastic radiation therapy: Secondary | ICD-10-CM | POA: Diagnosis not present

## 2015-05-21 DIAGNOSIS — I10 Essential (primary) hypertension: Secondary | ICD-10-CM | POA: Diagnosis not present

## 2015-05-21 DIAGNOSIS — Z170 Estrogen receptor positive status [ER+]: Secondary | ICD-10-CM | POA: Diagnosis not present

## 2015-05-21 DIAGNOSIS — Z8543 Personal history of malignant neoplasm of ovary: Secondary | ICD-10-CM | POA: Diagnosis not present

## 2015-05-21 DIAGNOSIS — D0511 Intraductal carcinoma in situ of right breast: Secondary | ICD-10-CM | POA: Diagnosis not present

## 2015-05-21 DIAGNOSIS — C50911 Malignant neoplasm of unspecified site of right female breast: Secondary | ICD-10-CM | POA: Diagnosis not present

## 2015-05-21 DIAGNOSIS — Z853 Personal history of malignant neoplasm of breast: Secondary | ICD-10-CM | POA: Diagnosis not present

## 2015-05-21 NOTE — Progress Notes (Signed)
   Department of Radiation Oncology  Phone:  [phone] Fax:        [phone]  Weekly Treatment Note    Name: Monique Watson Date: 05/24/2015 MRN: [id number] DOB: 1941/06/19   Current dose: 12.5 Gy  Current fraction: 5   MEDICATIONS: Current Outpatient Prescriptions  Medication Sig Dispense Refill  . Cholecalciferol (VITAMIN D3) 2000 UNITS TABS Take 1 capsule by mouth once a week.     . docusate sodium (COLACE) 100 MG capsule Take 100 mg by mouth daily.    . ferrous fumarate (HEMOCYTE - 106 MG FE) 325 (106 FE) MG TABS Take 1 tablet by mouth every 7 (seven) days. No certain day    . hyaluronate sodium (RADIAPLEXRX) GEL Apply 1 application topically 2 (two) times daily.    . non-metallic deodorant Jethro Poling) MISC Apply 1 application topically daily.    . Omega-3 Fatty Acids (FISH OIL CONCENTRATE) 1000 MG CAPS Take 1,000 mg by mouth daily.     . Potassium 99 MG TABS Take 2 tablets by mouth daily.     . tamoxifen (NOLVADEX) 20 MG tablet Take 1 tablet (20 mg total) by mouth daily. 90 tablet 1  . vitamin E (VITAMIN E) 400 UNIT capsule Take 400 Units by mouth 2 (two) times daily.     No current facility-administered medications for this encounter.     ALLERGIES: Arimidex; Diovan; and Uncoded nonscreenable allergen   LABORATORY DATA:  Lab Results  Component Value Date   WBC 5.5 04/06/2015   HGB 12.6 04/06/2015   HCT 36.7 04/06/2015   MCV 97.6 04/06/2015   PLT 100* 04/06/2015   Lab Results  Component Value Date   NA 140 04/06/2015   K 4.0 04/06/2015   CL 108 04/06/2015   CO2 28 04/06/2015   Lab Results  Component Value Date   ALT 16 04/06/2015   AST 20 04/06/2015   ALKPHOS 48 04/06/2015   BILITOT 0.8 04/06/2015     NARRATIVE: Monique Watson was seen today for weekly treatment management. The chart was checked and the patient's films were reviewed. Weekly rad txs to the right breast with 5/20 completed. No skin changes and she hasn't started radiaplex as of yet.  No c/o pain.   PHYSICAL EXAMINATION: vitals were not taken for this visit. Alert and oriented x 3. In no distress.  ASSESSMENT: The patient is doing satisfactorily with treatment.  PLAN: We will continue with the patient's radiation treatment as planned.  This document serves as a record of services personally performed by Kyung Rudd, MD. It was created on his behalf by Darcus Austin, a trained medical scribe. The creation of this record is based on the scribe's personal observations and the provider's statements to them. This document has been checked and approved by the attending provider.

## 2015-05-21 NOTE — Progress Notes (Signed)
Weekly rad txs right breast 5/20 completed, no skin changes, hasn't started radiaplex as yet, no c/o pain  10:44 AM Wt Readings from Last 3 Encounters:  05/18/15 192 lb 4.8 oz (87.227 kg)  04/19/15 195 lb (88.451 kg)  04/07/15 194 lb (87.998 kg)

## 2015-05-21 NOTE — Progress Notes (Signed)
Weekly rad txs right breast 5/20 completyd, no skin changes, hasn't started radiaplex as yet, no c/o pain  10:44 AM Wt Readings from Last 3 Encounters:  05/18/15 192 lb 4.8 oz (87.227 kg)  04/19/15 195 lb (88.451 kg)  04/07/15 194 lb (87.998 kg)

## 2015-05-23 ENCOUNTER — Other Ambulatory Visit: Payer: Self-pay | Admitting: Oncology

## 2015-05-25 ENCOUNTER — Ambulatory Visit
Admission: RE | Admit: 2015-05-25 | Discharge: 2015-05-25 | Disposition: A | Discharge status: Home / Self Care | Payer: Medicare Other | Source: Ambulatory Visit | Attending: Radiation Oncology | Admitting: Radiation Oncology

## 2015-05-25 DIAGNOSIS — I10 Essential (primary) hypertension: Secondary | ICD-10-CM | POA: Diagnosis not present

## 2015-05-25 DIAGNOSIS — Z170 Estrogen receptor positive status [ER+]: Secondary | ICD-10-CM | POA: Diagnosis not present

## 2015-05-25 DIAGNOSIS — Z510 Encounter for antineoplastic radiation therapy: Secondary | ICD-10-CM | POA: Diagnosis not present

## 2015-05-25 DIAGNOSIS — Z8543 Personal history of malignant neoplasm of ovary: Secondary | ICD-10-CM | POA: Diagnosis not present

## 2015-05-25 DIAGNOSIS — C50911 Malignant neoplasm of unspecified site of right female breast: Secondary | ICD-10-CM | POA: Diagnosis not present

## 2015-05-25 DIAGNOSIS — Z853 Personal history of malignant neoplasm of breast: Secondary | ICD-10-CM | POA: Diagnosis not present

## 2015-05-25 DIAGNOSIS — D0511 Intraductal carcinoma in situ of right breast: Secondary | ICD-10-CM | POA: Diagnosis not present

## 2015-05-26 ENCOUNTER — Ambulatory Visit
Admission: RE | Admit: 2015-05-26 | Discharge: 2015-05-26 | Disposition: A | Discharge status: Home / Self Care | Payer: Medicare Other | Source: Ambulatory Visit | Attending: Radiation Oncology | Admitting: Radiation Oncology

## 2015-05-26 DIAGNOSIS — Z170 Estrogen receptor positive status [ER+]: Secondary | ICD-10-CM | POA: Diagnosis not present

## 2015-05-26 DIAGNOSIS — Z8543 Personal history of malignant neoplasm of ovary: Secondary | ICD-10-CM | POA: Diagnosis not present

## 2015-05-26 DIAGNOSIS — Z510 Encounter for antineoplastic radiation therapy: Secondary | ICD-10-CM | POA: Diagnosis not present

## 2015-05-26 DIAGNOSIS — C50911 Malignant neoplasm of unspecified site of right female breast: Secondary | ICD-10-CM | POA: Diagnosis not present

## 2015-05-26 DIAGNOSIS — I10 Essential (primary) hypertension: Secondary | ICD-10-CM | POA: Diagnosis not present

## 2015-05-26 DIAGNOSIS — D0511 Intraductal carcinoma in situ of right breast: Secondary | ICD-10-CM | POA: Diagnosis not present

## 2015-05-26 DIAGNOSIS — Z853 Personal history of malignant neoplasm of breast: Secondary | ICD-10-CM | POA: Diagnosis not present

## 2015-05-27 ENCOUNTER — Other Ambulatory Visit (HOSPITAL_BASED_OUTPATIENT_CLINIC_OR_DEPARTMENT_OTHER): Payer: Medicare Other

## 2015-05-27 ENCOUNTER — Other Ambulatory Visit: Payer: Self-pay | Admitting: Nurse Practitioner

## 2015-05-27 ENCOUNTER — Ambulatory Visit
Admission: RE | Admit: 2015-05-27 | Discharge: 2015-05-27 | Disposition: A | Discharge status: Home / Self Care | Payer: Medicare Other | Source: Ambulatory Visit | Attending: Radiation Oncology | Admitting: Radiation Oncology

## 2015-05-27 ENCOUNTER — Encounter: Payer: Self-pay | Admitting: Oncology

## 2015-05-27 ENCOUNTER — Ambulatory Visit (HOSPITAL_BASED_OUTPATIENT_CLINIC_OR_DEPARTMENT_OTHER): Payer: Medicare Other

## 2015-05-27 ENCOUNTER — Telehealth: Payer: Self-pay | Admitting: Oncology

## 2015-05-27 ENCOUNTER — Ambulatory Visit (HOSPITAL_BASED_OUTPATIENT_CLINIC_OR_DEPARTMENT_OTHER): Payer: Medicare Other | Admitting: Oncology

## 2015-05-27 VITALS — BP 155/75 | HR 84 | Temp 98.1°F | Resp 20 | Ht 66.0 in | Wt 191.6 lb

## 2015-05-27 DIAGNOSIS — T782XXA Anaphylactic shock, unspecified, initial encounter: Secondary | ICD-10-CM

## 2015-05-27 DIAGNOSIS — Z510 Encounter for antineoplastic radiation therapy: Secondary | ICD-10-CM | POA: Diagnosis not present

## 2015-05-27 DIAGNOSIS — C50919 Malignant neoplasm of unspecified site of unspecified female breast: Secondary | ICD-10-CM

## 2015-05-27 DIAGNOSIS — Z452 Encounter for adjustment and management of vascular access device: Secondary | ICD-10-CM

## 2015-05-27 DIAGNOSIS — D0511 Intraductal carcinoma in situ of right breast: Secondary | ICD-10-CM | POA: Diagnosis not present

## 2015-05-27 DIAGNOSIS — Z95828 Presence of other vascular implants and grafts: Secondary | ICD-10-CM

## 2015-05-27 DIAGNOSIS — Z170 Estrogen receptor positive status [ER+]: Secondary | ICD-10-CM | POA: Diagnosis not present

## 2015-05-27 DIAGNOSIS — C50912 Malignant neoplasm of unspecified site of left female breast: Secondary | ICD-10-CM

## 2015-05-27 DIAGNOSIS — Z853 Personal history of malignant neoplasm of breast: Secondary | ICD-10-CM | POA: Diagnosis not present

## 2015-05-27 DIAGNOSIS — Z8543 Personal history of malignant neoplasm of ovary: Secondary | ICD-10-CM | POA: Diagnosis not present

## 2015-05-27 DIAGNOSIS — T7840XA Allergy, unspecified, initial encounter: Secondary | ICD-10-CM | POA: Diagnosis not present

## 2015-05-27 DIAGNOSIS — C50911 Malignant neoplasm of unspecified site of right female breast: Secondary | ICD-10-CM | POA: Diagnosis not present

## 2015-05-27 DIAGNOSIS — I10 Essential (primary) hypertension: Secondary | ICD-10-CM | POA: Diagnosis not present

## 2015-05-27 LAB — COMPREHENSIVE METABOLIC PANEL (CC13)
ALT: 13 U/L (ref 0–55)
AST: 18 U/L (ref 5–34)
Albumin: 3.3 g/dL — ABNORMAL LOW (ref 3.5–5.0)
Alkaline Phosphatase: 56 U/L (ref 40–150)
Anion Gap: 8 mEq/L (ref 3–11)
BUN: 14.3 mg/dL (ref 7.0–26.0)
CO2: 24 mEq/L (ref 22–29)
Calcium: 8.9 mg/dL (ref 8.4–10.4)
Chloride: 112 mEq/L — ABNORMAL HIGH (ref 98–109)
Creatinine: 1 mg/dL (ref 0.6–1.1)
EGFR: 64 mL/min/{1.73_m2} — ABNORMAL LOW (ref >90)
Glucose: 98 mg/dl (ref 70–140)
Potassium: 3.8 mEq/L (ref 3.5–5.1)
Sodium: 144 mEq/L (ref 136–145)
Total Bilirubin: 0.7 mg/dL (ref 0.20–1.20)
Total Protein: 7.2 g/dL (ref 6.4–8.3)

## 2015-05-27 LAB — CBC WITH DIFFERENTIAL/PLATELET
BASO%: 0.6 % (ref 0.0–2.0)
Basophils Absolute: 0 10*3/uL (ref 0.0–0.1)
EOS%: 1.2 % (ref 0.0–7.0)
Eosinophils Absolute: 0.1 10*3/uL (ref 0.0–0.5)
HCT: 37.4 % (ref 34.8–46.6)
HGB: 12.7 g/dL (ref 11.6–15.9)
LYMPH%: 18.6 % (ref 14.0–49.7)
MCH: 33.2 pg (ref 25.1–34.0)
MCHC: 33.9 g/dL (ref 31.5–36.0)
MCV: 97.8 fL (ref 79.5–101.0)
MONO#: 0.5 10*3/uL (ref 0.1–0.9)
MONO%: 8.2 % (ref 0.0–14.0)
NEUT#: 3.9 10*3/uL (ref 1.5–6.5)
NEUT%: 71.4 % (ref 38.4–76.8)
Platelets: 111 10*3/uL — ABNORMAL LOW (ref 145–400)
RBC: 3.83 10*6/uL (ref 3.70–5.45)
RDW: 13.1 % (ref 11.2–14.5)
WBC: 5.5 10*3/uL (ref 3.9–10.3)
lymph#: 1 10*3/uL (ref 0.9–3.3)

## 2015-05-27 MED ORDER — FAMOTIDINE IN NACL 20-0.9 MG/50ML-% IV SOLN
INTRAVENOUS | Status: AC
  Filled 2015-05-27: qty 50

## 2015-05-27 MED ORDER — METHYLPREDNISOLONE SODIUM SUCC 125 MG IJ SOLR
125.0000 mg | Freq: Once | INTRAMUSCULAR | Status: AC
  Administered 2015-05-27: 125 mg via INTRAVENOUS

## 2015-05-27 MED ORDER — DIPHENHYDRAMINE HCL 50 MG/ML IJ SOLN
25.0000 mg | Freq: Once | INTRAMUSCULAR | Status: AC
  Administered 2015-05-27: 25 mg via INTRAVENOUS

## 2015-05-27 MED ORDER — FAMOTIDINE IN NACL 20-0.9 MG/50ML-% IV SOLN
20.0000 mg | Freq: Once | INTRAVENOUS | Status: AC
  Administered 2015-05-27: 20 mg via INTRAVENOUS

## 2015-05-27 MED ORDER — ALBUTEROL SULFATE (2.5 MG/3ML) 0.083% IN NEBU
2.5000 mg | INHALATION_SOLUTION | Freq: Once | RESPIRATORY_TRACT | Status: AC
Start: 2015-05-27 — End: 2015-05-27
  Administered 2015-05-27: 2.5 mg via RESPIRATORY_TRACT
  Filled 2015-05-27: qty 3

## 2015-05-27 MED ORDER — SODIUM CHLORIDE 0.9 % IJ SOLN
10.0000 mL | INTRAMUSCULAR | Status: DC | PRN
  Administered 2015-05-27: 10 mL via INTRAVENOUS
  Filled 2015-05-27: qty 10

## 2015-05-27 MED ORDER — SODIUM CHLORIDE 0.9 % IV SOLN
INTRAVENOUS | Status: DC
  Administered 2015-05-27: 12:00:00 via INTRAVENOUS

## 2015-05-27 MED ORDER — ALBUTEROL SULFATE (2.5 MG/3ML) 0.083% IN NEBU: 2.5000 mg | INHALATION_SOLUTION | Freq: Four times a day (QID) | RESPIRATORY_TRACT | Status: DC | PRN

## 2015-05-27 MED ORDER — METHYLPREDNISOLONE SODIUM SUCC 125 MG IJ SOLR
INTRAMUSCULAR | Status: AC
  Filled 2015-05-27: qty 2

## 2015-05-27 MED ORDER — HEPARIN SOD (PORK) LOCK FLUSH 100 UNIT/ML IV SOLN
500.0000 [IU] | Freq: Once | INTRAVENOUS | Status: AC
  Administered 2015-05-27: 500 [IU] via INTRAVENOUS
  Filled 2015-05-27: qty 5

## 2015-05-27 NOTE — Progress Notes (Signed)
Patient experiencing hypersensitivity reaction after PAC flush, redness to PAC site, itching, hives to chest and back, and chest tightness. Pt denies allergies to heparin or adhesive tape. No new medications or detergent used. Dr. [name] assessing patient at this time in exam room. Swelling and firmness noted to R arm; left arm used for PIV, OK per MD due to lymph nodes removed from L breast in distant past (2006). Per MD, albuterol neb and benadryl 25 mg IVP given at this time. Patient able to ambulate independently to the bathroom and reports symptoms improvement upon return to exam room. RN monitoring. Patient transported to symptom management room to recover at 1225 in stable condition.

## 2015-05-27 NOTE — Patient Instructions (Signed)

## 2015-05-27 NOTE — Progress Notes (Signed)
OFFICE PROGRESS NOTE   May 27, 2015   Physicians: D.ClarkePearson, E.Barnes, T.Brackbill , J.Mann  INTERVAL HISTORY:   Patient is seen, alone for visit, this intended as consultation for recently diagnosed DCIS right breast, however patient had acute allergic reaction related to PAC flush after arriving at office. Entirety of visit today was managing the acute allergic reaction.  Patient has had right sided PAC in place since 2006, placed by Dr Kathrin Penner and kept due to extremely difficult IV access including blood draws. She has regularly had the PAC flushed at this office, with no problems previously.  Patient was feeling well on arrival to [facility] medical oncology, having had radiation to right breast earlier today. She specifically had had no new medications or contacts, no pruritis or hives, no SOB, no chest discomfort, no problems with PAC, no fever or symptoms of infection. She has no history of latex allergy and all materials used with the flush were latex free. Per flush nurse, PAC area was unremarkable when cleaned per protocol with chlorhexidine, immediately after which patient mentioned area itching. PAC was then accessed without difficulty, good blood return, flushed with heparin after itching already reported. Itching progressed at [facility] area, extended to back, extremities, chest with erythema and hives over next >=10 min, then SOB with wheezing, central chest constriction sensation and tight swelling of right forearm. Vitals taken as pruritis and hives were developing had BP 157/74, HR 89 regular, respirations 18, temp 98.4. Area of PAC was erythematous and puffy, such that peripheral IV access was managed in left hand, with IV benadryl given, with improvement in hives and severe pruritis. Nebulizer was done with improvement in respiratory symptoms and prompt resolution of the central chest symptoms. Repeat vitals had BP 149/77, HR 94 regular, respirations 20, O2 sat 100%. Patient was  alert thru all of this, obviously in progressive distress initially but rapidly improved with other interventions. She appeared stable then for close observation in office over next 2 hours, with addition of IV steroids and IV pepcid.   Objective:  Vital signs in last 24 hours:  BP 155/75 mmHg  Pulse 84  Temp(Src) 98.1 F (36.7 C) (Oral)  Resp 20  Ht _0  (1.676 m)  Wt 191 lb 9.6 oz (86.909 kg)  BMI 30.94 kg/m2  SpO2 100% Exam during reaction Alert, oriented and appropriate. Not cyanotic HEENT:PERRL. Oral mucosa moist  Resp: Wheezes bilaterally, no use of accessory  muscles Cardio: regular rate and rhythm.  GI: soft, nontender Musculoskeletal/ Extremities: Right forearm tightly swollen. LE without pitting edema, cords, tenderness Skin erythema and hives anterior chest including PAC, upper extremities, upper back. None on face. Portacath- swelling and erythema 3-4 cm diameter  Within 5 min of IV benadryl and nebulizer, erythema and hives improving and wheezing resolved. Forearm less swollen over next 15-20 min.   Lab Results: Labs drawn from [facility] just as reaction began: Results for orders placed or performed in visit on 05/27/15  CBC with Differential  Result Value Ref Range   WBC 5.5 3.9 - 10.3 10e3/uL   NEUT# 3.9 1.5 - 6.5 10e3/uL   HGB 12.7 11.6 - 15.9 g/dL   HCT 37.4 34.8 - 46.6 %   Platelets 111 (L) 145 - 400 10e3/uL   MCV 97.8 79.5 - 101.0 fL   MCH 33.2 25.1 - 34.0 pg   MCHC 33.9 31.5 - 36.0 g/dL   RBC 3.83 3.70 - 5.45 10e6/uL   RDW 13.1 11.2 - 14.5 %  lymph# 1.0 0.9 - 3.3 10e3/uL   MONO# 0.5 0.1 - 0.9 10e3/uL   Eosinophils Absolute 0.1 0.0 - 0.5 10e3/uL   Basophils Absolute 0.0 0.0 - 0.1 10e3/uL   NEUT% 71.4 38.4 - 76.8 %   LYMPH% 18.6 14.0 - 49.7 %   MONO% 8.2 0.0 - 14.0 %   EOS% 1.2 0.0 - 7.0 %   BASO% 0.6 0.0 - 2.0 %  Comprehensive metabolic panel (Cmet) - [facility]  Result Value Ref Range   Sodium 144 136 - 145 mEq/L   Potassium 3.8 3.5 - 5.1 mEq/L    Chloride 112 (H) 98 - 109 mEq/L   CO2 24 22 - 29 mEq/L   Glucose 98 70 - 140 mg/dl   BUN 14.3 7.0 - 26.0 mg/dL   Creatinine 1.0 0.6 - 1.1 mg/dL   Total Bilirubin 0.70 0.20 - 1.20 mg/dL   Alkaline Phosphatase 56 40 - 150 U/L   AST 18 5 - 34 U/L   ALT 13 0 - 55 U/L   Total Protein 7.2 6.4 - 8.3 g/dL   Albumin 3.3 (L) 3.5 - 5.0 g/dL   Calcium 8.9 8.4 - 10.4 mg/dL   Anion Gap 8 3 - 11 mEq/L   EGFR 64 (L) >90 ml/min/1.73 m2     Studies/Results:  No results found.  Medications: I have reviewed the patient's current medications.  DISCUSSION: MD reviewed directly with flush nurse. Drug information for chlorhexidine reports <1% incidence of anaphylaxis, and more frequently sensitization resulting in skin reactions. Patient instructed in apparent cause of the reaction and EMR alert to be placed in addition to listing in EMR allergies.  Assessment/Plan: 1.acute anaphylactic reaction to chlorhexidine used prior to [facility] access: resolved with interventions as above, patient also to use additional benadryl and pepcid po this evening.  2.PAC in place since 2006. Will discuss removal at upcoming visit 3.recent diagnosis DCIS: will reschedule visit addressing this, either to 9-9 or in ~ 2 weeks. Radiation in process 4.history of multifocal left breast cancer 2010.  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    Gordy Levan, MD   05/27/2015, 8:34 PM

## 2015-05-27 NOTE — Telephone Encounter (Signed)
Appointment made and Juliann Pulse with [facility] is aware

## 2015-05-28 ENCOUNTER — Encounter: Payer: Self-pay | Admitting: Oncology

## 2015-05-28 ENCOUNTER — Telehealth: Payer: Self-pay | Admitting: Oncology

## 2015-05-28 ENCOUNTER — Ambulatory Visit (HOSPITAL_BASED_OUTPATIENT_CLINIC_OR_DEPARTMENT_OTHER): Payer: Medicare Other | Admitting: Oncology

## 2015-05-28 ENCOUNTER — Ambulatory Visit
Admission: RE | Admit: 2015-05-28 | Discharge: 2015-05-28 | Disposition: A | Discharge status: Home / Self Care | Payer: Medicare Other | Source: Ambulatory Visit | Attending: Radiation Oncology | Admitting: Radiation Oncology

## 2015-05-28 ENCOUNTER — Encounter: Payer: Self-pay | Admitting: Radiation Oncology

## 2015-05-28 VITALS — BP 144/72 | HR 74 | Temp 98.6°F | Resp 12 | Ht 66.0 in | Wt 193.1 lb

## 2015-05-28 VITALS — BP 148/70 | HR 85 | Temp 98.1°F | Resp 18 | Ht 66.0 in | Wt 195.8 lb

## 2015-05-28 DIAGNOSIS — Z853 Personal history of malignant neoplasm of breast: Secondary | ICD-10-CM | POA: Diagnosis not present

## 2015-05-28 DIAGNOSIS — T782XXA Anaphylactic shock, unspecified, initial encounter: Secondary | ICD-10-CM | POA: Insufficient documentation

## 2015-05-28 DIAGNOSIS — T782XXD Anaphylactic shock, unspecified, subsequent encounter: Secondary | ICD-10-CM

## 2015-05-28 DIAGNOSIS — I10 Essential (primary) hypertension: Secondary | ICD-10-CM | POA: Diagnosis not present

## 2015-05-28 DIAGNOSIS — C50911 Malignant neoplasm of unspecified site of right female breast: Secondary | ICD-10-CM | POA: Diagnosis not present

## 2015-05-28 DIAGNOSIS — Z170 Estrogen receptor positive status [ER+]: Secondary | ICD-10-CM | POA: Diagnosis not present

## 2015-05-28 DIAGNOSIS — D0511 Intraductal carcinoma in situ of right breast: Secondary | ICD-10-CM | POA: Diagnosis not present

## 2015-05-28 DIAGNOSIS — C569 Malignant neoplasm of unspecified ovary: Secondary | ICD-10-CM

## 2015-05-28 DIAGNOSIS — Z1509 Genetic susceptibility to other malignant neoplasm: Secondary | ICD-10-CM

## 2015-05-28 DIAGNOSIS — Z87891 Personal history of nicotine dependence: Secondary | ICD-10-CM

## 2015-05-28 DIAGNOSIS — Z8543 Personal history of malignant neoplasm of ovary: Secondary | ICD-10-CM

## 2015-05-28 DIAGNOSIS — Z86010 Personal history of colonic polyps: Secondary | ICD-10-CM

## 2015-05-28 DIAGNOSIS — C50912 Malignant neoplasm of unspecified site of left female breast: Secondary | ICD-10-CM | POA: Diagnosis not present

## 2015-05-28 DIAGNOSIS — Z79810 Long term (current) use of selective estrogen receptor modulators (SERMs): Secondary | ICD-10-CM

## 2015-05-28 DIAGNOSIS — C50311 Malignant neoplasm of lower-inner quadrant of right female breast: Secondary | ICD-10-CM

## 2015-05-28 DIAGNOSIS — D696 Thrombocytopenia, unspecified: Secondary | ICD-10-CM | POA: Diagnosis not present

## 2015-05-28 DIAGNOSIS — Z95828 Presence of other vascular implants and grafts: Secondary | ICD-10-CM | POA: Insufficient documentation

## 2015-05-28 DIAGNOSIS — D0510 Intraductal carcinoma in situ of unspecified breast: Secondary | ICD-10-CM | POA: Insufficient documentation

## 2015-05-28 DIAGNOSIS — Z510 Encounter for antineoplastic radiation therapy: Secondary | ICD-10-CM | POA: Diagnosis not present

## 2015-05-28 DIAGNOSIS — Z1501 Genetic susceptibility to malignant neoplasm of breast: Secondary | ICD-10-CM

## 2015-05-28 MED ORDER — TAMOXIFEN CITRATE 20 MG PO TABS: 20.0000 mg | ORAL_TABLET | Freq: Every day | ORAL | Status: DC

## 2015-05-28 NOTE — Progress Notes (Signed)
   Department of Radiation Oncology  Phone:  [phone] Fax:        [phone]  Weekly Treatment Note    Name: Monique Watson Date: 05/28/2015 MRN: [id number] DOB: Feb 04, 1941   Current dose: 22.5 Gy  Current fraction:9   MEDICATIONS: Current Outpatient Prescriptions  Medication Sig Dispense Refill  . Cholecalciferol (VITAMIN D3) 2000 UNITS TABS Take 1 capsule by mouth once a week.     . docusate sodium (COLACE) 100 MG capsule Take 100 mg by mouth daily.    . ferrous fumarate (HEMOCYTE - 106 MG FE) 325 (106 FE) MG TABS Take 1 tablet by mouth every 7 (seven) days. No certain day    . hyaluronate sodium (RADIAPLEXRX) GEL Apply 1 application topically 2 (two) times daily.    . non-metallic deodorant Jethro Poling) MISC Apply 1 application topically daily.    . Omega-3 Fatty Acids (FISH OIL CONCENTRATE) 1000 MG CAPS Take 1,000 mg by mouth daily.     . Potassium 99 MG TABS Take 2 tablets by mouth daily.     . vitamin E (VITAMIN E) 400 UNIT capsule Take 400 Units by mouth 2 (two) times daily.    . tamoxifen (NOLVADEX) 20 MG tablet Take 1 tablet (20 mg total) by mouth daily. 90 tablet 1   No current facility-administered medications for this encounter.     ALLERGIES: Chlorhexidine; Arimidex; Diovan; and Uncoded nonscreenable allergen   LABORATORY DATA:  Lab Results  Component Value Date   WBC 5.5 05/27/2015   HGB 12.7 05/27/2015   HCT 37.4 05/27/2015   MCV 97.8 05/27/2015   PLT 111* 05/27/2015   Lab Results  Component Value Date   NA 144 05/27/2015   K 3.8 05/27/2015   CL 108 04/06/2015   CO2 24 05/27/2015   Lab Results  Component Value Date   ALT 13 05/27/2015   AST 18 05/27/2015   ALKPHOS 56 05/27/2015   BILITOT 0.70 05/27/2015     NARRATIVE: Monique Watson was seen today for weekly treatment management. The chart was checked and the patient's films were reviewed.  Monique Watson has completed 9 fractions to her right breast.  She denies pain.  She is starting to  feel fatigued.  The skin on her right breast is red with slight hyperpigmentation.  She is using radiaplex.  BP 144/72 mmHg  Pulse 74  Temp(Src) 98.6 F (37 C) (Oral)  Resp 12  Ht 5\' 6"  (1.676 m)  Wt 193 lb 1.6 oz (87.59 kg)  BMI 31.18 kg/m2  PHYSICAL EXAMINATION: height is 5\' 6"  (1.676 m) and weight is 193 lb 1.6 oz (87.59 kg). Her oral temperature is 98.6 F (37 C). Her blood pressure is 144/72 and her pulse is 74. Her respiration is 12.      Thee patient's skin shows some hyperpigmentation. No desquamation.  ASSESSMENT: The patient is doing satisfactorily with treatment.  PLAN: We will continue with the patient's radiation treatment as planned.

## 2015-05-28 NOTE — Progress Notes (Signed)
Monique Watson has completed 9 fractions to her right breast.  She denies pain.  She is starting to feel fatigued.  The skin on her right breast is red with slight hyperpigmentation.  She is using radiaplex.  BP 144/72 mmHg  Pulse 74  Temp(Src) 98.6 F (37 C) (Oral)  Resp 12  Ht 5\' 6"  (1.676 m)  Wt 193 lb 1.6 oz (87.59 kg)  BMI 31.18 kg/m2

## 2015-05-28 NOTE — Progress Notes (Signed)
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 Prior to acute events yesterday, I had seen her last in 12-2014.   She is BRCA 2 positive, has history of multifocal T1N0 left breast ca in 11-2008 and IIIC ovarian in 03-2005.  She is to see Dr Brantley Stage in 6 months and Dr Josephina Shih on 07-02-15 (yearly). She has had mild thrombocytopenia x years. PAC in, see below   Anaphylactic reaction to topical chlorhexidine at [facility] on 05-27-15: Patient reports that pruritis and hives had completely resolved by time that she left this office on 05-27-15, tho right forearm was still somewhat swollen. She slept well last pm, arm not swollen at all by this AM and no other symptoms remaining. Specifically, breathing has been at baseline today with no wheezing or chest tightness, no itching, no discomfort at [facility]. She confirms that she was feeling entirely well prior to skin prep for PAC flush on 05-27-15, and had never had itching from prep previously. She has had diffuse itching from "body wash" and some fabric softeners in past. She denies any history of latex allergy (all products at this office latex free). She understands that she should read labels and carefully instruct all health care staff that she is allergic to chlorhexidine; she has card written for her wallet.  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 Last bilateral mammograms were at [facility] 12-16-14, with no mamographic findings of concern. With the BRCA abnormality, she had breast MRI on 01-27-15, with 5 x 8 x 9 mm area of concern in right central breast. She had US  biopsy at [facility] on 02-09-15 with benign path ([id number]). She then had MRI core biopsy on 02-24-15 which identified intermediate grade DCIS with comedo necrosis, ER + 95% and PR 0. The pathology information was reported to radiologist, and patient referred to general surgery. Patient tells me that mastectomy was discussed due to her BRCA mutation, however she preferred lumpectomy. She had seed localized partial mastectomy by Dr Brantley Stage on 04-07-15, with pathology showing 1.1 cm intermediate grade DCIS with necrosis, closest margin < 1 mm ([id number]). Patient reportedly declined reexcision of the close margin. She was referred to Dr Lisbeth Renshaw, with local radiation in process, planned thru 06-13-25. She has healed well from the surgery, has some fatigue and minimal skin irritation in radiation field, otherwise tolerating this treatment well.     PAC placed by Dr Kathrin Penner 2006 BRCA 2 mutation documented 08-2014 No flu vaccine as yet 2016  CA 125 was elevated at ovarian cancer diagnosis.    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 She was on tamoxifen prior to and at time of biopsy and surgery. She declined mastectomy, had right central breast partial mastectomy 04-07-15 and local radiation is in process.  LEFT BREAST cancer was diagnosed in March 2010, multifocal T1N0 at lumpectomy and sentinel node evaluation, ER/PR positive and HER 2 negative. 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 Surgery was TAH/BSO/omentectomy and radical complete  debulking including anterior rectal resection with anastomosis. She had 6 cycles of adjuvant taxol/ carboplatin from 04-18-2005 thru 08-14-2005. She is followed yearly by gyn oncology She was found to have BRCA 2 mutation by testing 08-2014    Review of systems as above, also: No fever or symptoms of infection. No noted changes in left breast. Appetite good. No abdominal or pelvic discomfort. Bowels moving well. No increased SOB or other respiratory symptoms. No bleeding or unusual bleeding. No discomfort at [facility] now. Remains nonsmoking but does not like that she has gained weight Remainder of 10 point Review of Systems negative.  Objective:  Vital signs in last 24 hours:  BP 148/70 mmHg  Pulse 85  Temp(Src) 98.1 F (36.7 C) (Oral)  Resp 18  Ht '5\' 6"'  (1.676 m)  Wt 195 lb 12.8 oz (88.814 kg)  BMI 31.62 kg/m2  SpO2 96%  Alert, oriented and appropriate. Ambulatory without difficulty.   HEENT:PERRL, sclerae not icteric. Oral mucosa moist without lesions, posterior pharynx clear.  Neck supple. No JVD.  Lymphatics:no cervical,supraclavicular, axillary or inguinal adenopathy Resp: somewhat diminished BS thruout with slight expiratory wheeze right base, otherwise clear to auscultation bilaterally and no dullness to percussion bilaterally Cardio: regular rate and rhythm. No gallop. GI: soft, nontender, not distended, no mass or organomegaly. Normally active bowel sounds. Surgical incision not remarkable. Musculoskeletal/ Extremities: without pitting edema, cords, tenderness. No swelling, firmness, tenderness right forearm. Neuro:  nonfocal Skin without rash, ecchymosis, petechiae, hives, erythema.  Breasts: Right with minimal erythema in radiation portal without desquamation, surgical incision periareolar healed and not remarkable; left with well healed lumpectomy scar, otherwise bilaterally without dominant mass, skin or nipple findings. Axillae benign. Portacath-without erythema or  tenderness today  Lab Results:  Results for orders placed or performed in visit on 05/27/15  CBC with Differential  Result Value Ref Range   WBC 5.5 3.9 - 10.3 10e3/uL   NEUT# 3.9 1.5 - 6.5 10e3/uL   HGB 12.7 11.6 - 15.9 g/dL   HCT 37.4 34.8 - 46.6 %   Platelets 111 (L) 145 - 400 10e3/uL   MCV 97.8 79.5 - 101.0 fL   MCH 33.2 25.1 - 34.0 pg   MCHC 33.9 31.5 - 36.0 g/dL   RBC 3.83 3.70 - 5.45 10e6/uL   RDW 13.1 11.2 - 14.5 %   lymph# 1.0 0.9 - 3.3 10e3/uL   MONO# 0.5 0.1 - 0.9 10e3/uL   Eosinophils Absolute 0.1 0.0 - 0.5 10e3/uL   Basophils Absolute 0.0 0.0 - 0.1 10e3/uL   NEUT% 71.4 38.4 - 76.8 %   LYMPH% 18.6 14.0 - 49.7 %   MONO% 8.2 0.0 - 14.0 %   EOS% 1.2 0.0 - 7.0 %   BASO% 0.6 0.0 - 2.0 %  Comprehensive metabolic panel (Cmet) - [facility]  Result Value Ref Range   Sodium 144 136 - 145 mEq/L   Potassium 3.8 3.5 - 5.1 mEq/L   Chloride 112 (H) 98 - 109 mEq/L   CO2 24 22 - 29 mEq/L   Glucose 98 70 - 140 mg/dl   BUN 14.3 7.0 - 26.0 mg/dL   Creatinine 1.0 0.6 - 1.1 mg/dL   Total Bilirubin 0.70 0.20 - 1.20 mg/dL   Alkaline Phosphatase  56 40 - 150 U/L   AST 18 5 - 34 U/L   ALT 13 0 - 55 U/L   Total Protein 7.2 6.4 - 8.3 g/dL   Albumin 3.3 (L) 3.5 - 5.0 g/dL   Calcium 8.9 8.4 - 10.4 mg/dL   Anion Gap 8 3 - 11 mEq/L   EGFR 64 (L) >90 ml/min/1.73 m2     Studies/Results:  Dietzman, Stacia S Collected: 02/09/2015 Client: [facility] @ [address] Accession: [id number] Received: 02/09/2015 Johnnette Gourd, MDORT OF SURGICAL PATHOLOGY FINAL DIAGNOSIS Diagnosis Breast, right, needle core biopsy - BENIGN BREAST TISSUE, SEE COMMENT. - NEGATIVE FOR ATYPIA OR MALIGNANCY. Microscopic Comment The needle core biopsies consist predominantly of benign fibrofatty mammary soft tissue. There are patchy areas of stromal hyalinization in which there are benign ducts and lobules present. There are no features of atypia or malignancy present. (CR:kh 02-10-15)     Patient: NECIE, WILCOXSON Collected: 02/24/2015 Client: [facility] Accession: [id number] Received: 02/24/2015 Lillia Mountain, MD: REPORT OF SURGICAL PATHOLOGY ADDITIONAL INFORMATION: PROGNOSTIC INDICATORS - ACIS Results: IMMUNOHISTOCHEMICAL AND MORPHOMETRIC ANALYSIS BY THE AUTOMATED CELLULAR IMAGING SYSTEM (ACIS) Estrogen Receptor: 95%, POSITIVE, STRONG STAINING INTENSITY (PERFORMED MANUALLY) Progesterone Receptor: 0%, NEGATIVE (PERFORMED MANUALLY) COMMENT: The negative hormone receptor study(ies) in this case has no internal positive control. REFERENCE RANGE ESTROGEN RECEPTOR NEGATIVE <1% POSITIVE =>1% PROGESTERONE RECEPTOR NEGATIVE <1% POSITIVE =>1% All controls stained appropriately Enid Cutter MD Pathologist, Electronic Signature ( Signed 03/02/2015) FINAL DIAGNOSIS Diagnosis Breast, right, needle core biopsy, lower inner quadrant - DUCTAL CARCINOMA IN SITU WITH COMEDONECROSIS. - SEE COMMENT. Microscopic Comment Although definitive grading of tumor is best performed on excision specimen, as sampled, the ductal carcinoma in situ appears intermediate grade.    [id number] Received: 04/07/2015 Erroll Luna, MD DOB: Mar 22, 1941 Age: 35 Gender: F Reported: 04/08/2015 [address] FINAL DIAGNOSIS Diagnosis Breast, lumpectomy, right - INTERMEDIATE GRADE DUCTAL CARCINOMA IN SITU WITH NECROSIS. - TUMOR IS EXTREMELY CLOSE TO LATERAL MARGIN (LESS THAN 0.1 CM), INCLUDING LATERAL MARGIN NEAR ANTERIOR ASPECT. - OTHER MARGINS ARE NEGATIVE. - SEE ONCOLOGY TEMPLATE. Microscopic Comment BREAST, IN SITU CARCINOMA Specimen, including laterality: Right partial breast. Procedure (include lymph node sampling sentinel-non-sentinel Right breast lumpectomy. Grade of carcinoma: Intermediate grade. Necrosis: Yes. Estimated tumor size: (gross measurement): 1.1 cm. Treatment effect: Not applicable. Distance to closest margin: Ductal carcinoma in situ is extremely close to  lateral margin (less than 0.1 cm), including lateral margin near anterior aspect. Breast prognostic profile: Performed on previous case [id number] Estrogen receptor: 95%, positive. Progesterone receptor: 0%, negative. Lymph nodes: No lymph nodes received. TNM: pTis, pNX.    Medications: I have reviewed the patient's current medications. Tamoxifen refilled  DISCUSSION:  Anaphylactic reaction to chlorhexidine discussed, precautions reviewed, written alert to be carried with her, EMR alert in place.   I have recommended that she have the PAC removed, as this has been in place 10 years, is not needed for oncology care at present, and risk of inadvertent chlorhexidine use is of concern. Tho PAC was useful during hospitalization for pneumonia in past 2 years, other access could be used if needed. Patient is in agreement with removal of PAC. I have spoken with Dr Josetta Huddle associate, who will contact patient for appointment; I have specifically given that office the information re chlorhexidine reaction.  Right breast DCIS discussed, including pathology findings and the close margin. I have told patient that, if any further concerns, mastectomy would be most appropriate given the  BRCA 2 abnormality. Obviously the tamoxifen did not prevent this new DCIS, even tho it was strongly ER +, however she was unable to tolerate aromatase inhibitors previously due to severe arthralgias. We have decided to continue tamoxifen as no other reasonable options now.   She will keep yearly follow up with Dr Josephina Shih as scheduled in Oct.  We will give flu vaccine in ~ 2 weeks, as I preferred not to give this so close to allergic reaction yesterday. Will coordinate the vaccine with RT appointment.  Assessment/Plan:  1.DCIS right breast: found on MRI 01-2015 done due to BRCA 2 abnormality, while patient was on adjuvant tamoxifen for left breast cancer. Post central partial mastectomy 04-07-15, reportedly at  patient's request, with close margin. ER strongly +. Radiation in process. She understands that if any further concerns, mastectomy would be best. Will continue tamoxifen, without other good options now. BRCA 2 positive.  2.Acute anaphylactic reaction to chlorhexidine topical prior to PAC flush on 05-27-15. Acute reaction resolved. Precautionary measures as above. 3.T1N0 multifocal ER PR + HER 2 negative left breast cancer 11-2008. Post lumpectomy with radiation. BRCA 2 positive. Intolerant to Femara and Aromasin, will continue tamoxifen for now 4.IIIC papillary serous ovarian cancer: surgery by Dr Josephina Shih 03-2005 followed by adjuvant carboplatin taxol thru 07-2005. No known active disease. Continues yearly follow up with gyn oncology, which we appreciate. BRCA 2 positive. 5.PAC in since chemo 2006, placed by Dr Bubba Camp. Very poor peripheral IV access, however I am not comfortable taking chance on exposure to chlorhexidine with this, and not otherwise needed at present. Dr Cornett's office aware that we would like this removed; I am glad to speak with him if needed.  6.long past tobacco and COPD. Encouraged continued nonsmoking 7.mild thrombocytopenia x years, stable and asymptomatic 8.Last colonoscopy 06-2012 by Dr Collene Mares, with 4 small sessile polyps, repeat planned in 5 years 9.needs flu vaccine this fall, planned in ~ 2 weeks 10. Pneumonia 09-2014, hospitalized  All questions answered. Time spent 45 min including >50% counseling and coordination of care. Cc Drs Drema Dallas, Cornett, Park Meo. I will see her again in Nov or sooner if needed.    Tishina Lown P, MD   05/28/2015, 3:19 PM

## 2015-05-28 NOTE — Telephone Encounter (Signed)
Gave avs & calendar for November & December. °

## 2015-05-31 ENCOUNTER — Ambulatory Visit
Admission: RE | Admit: 2015-05-31 | Discharge: 2015-05-31 | Disposition: A | Discharge status: Home / Self Care | Payer: Medicare Other | Source: Ambulatory Visit | Attending: Radiation Oncology | Admitting: Radiation Oncology

## 2015-05-31 ENCOUNTER — Ambulatory Visit: Payer: Self-pay | Admitting: Surgery

## 2015-05-31 DIAGNOSIS — Z510 Encounter for antineoplastic radiation therapy: Secondary | ICD-10-CM | POA: Diagnosis not present

## 2015-05-31 DIAGNOSIS — C50911 Malignant neoplasm of unspecified site of right female breast: Secondary | ICD-10-CM | POA: Diagnosis not present

## 2015-05-31 DIAGNOSIS — D0511 Intraductal carcinoma in situ of right breast: Secondary | ICD-10-CM | POA: Diagnosis not present

## 2015-05-31 DIAGNOSIS — Z8543 Personal history of malignant neoplasm of ovary: Secondary | ICD-10-CM | POA: Diagnosis not present

## 2015-05-31 DIAGNOSIS — Z170 Estrogen receptor positive status [ER+]: Secondary | ICD-10-CM | POA: Diagnosis not present

## 2015-05-31 DIAGNOSIS — I10 Essential (primary) hypertension: Secondary | ICD-10-CM | POA: Diagnosis not present

## 2015-05-31 DIAGNOSIS — Z853 Personal history of malignant neoplasm of breast: Secondary | ICD-10-CM | POA: Diagnosis not present

## 2015-05-31 NOTE — Progress Notes (Signed)
Addendum: Patient was transported to the symptom management clinic for further evaluation and monitoring.  Post hypersensitivity reaction most likely to chlorhexidine cleaners used prior to accessing patient's Port-A-Cath today.  Patient received Pepcid 20 mg & Medrol 125 mg IV while in the symptom management clinic.  Patient received additional IV fluid rehydration as well.  Patient completely recovered from all of her symptoms; and was able to eat a snack and drink fluids with no difficulty whatsoever.    Patient was advised to call/return directly to the emergency department for any worsening symptoms overnight whatsoever.  She has plans to return tomorrow to follow-up with Dr. [name].

## 2015-05-31 NOTE — H&P (Signed)
Monique Watson 05/31/2015 2:14 PM Location: [facility] Patient #: [id number] DOB: [birth year] Married / Language: English / Race: Black or African American Female History of Present Illness Marcello Moores A. Kensington Rios MD; 05/31/2015 2:42 PM) Patient words: reck  PT RETURNS TO DISCUSS PORT REMOVAL SINCE SHE WILL NO LONGER NEED IT. IT HAS BEEN IN PLACE SINCE 2006 AND IS NO LONGER NEEDED. SHE HAD AN ALLERGIC REACTION TO CHLOHEXADINE IN THE PAST. FEELS WELL SINCE HER LUMPECTOMY.  The patient is a 74 year old female   Allergies Marjean Donna, CMA; 05/31/2015 2:14 PM) Arimidex *ANTINEOPLASTICS AND ADJUNCTIVE THERAPIES* Diovan *ANTIHYPERTENSIVES* Chlorhexidine *PHARMACEUTICAL ADJUVANTS* Anaphylaxis.  Medication History Marjean Donna, CMA; 05/31/2015 2:14 PM) Tamoxifen Citrate (20MG  Tablet, Oral) Active. Albuterol Sulfate (108 (90 Base)MCG/ACT Aero Pow Br Act, Inhalation) Active. Calcium Carbonate (600MG  Tablet, Oral) Active. Vitamin D3 (1000UNIT Tablet, Oral) Active. Colace (100MG  Capsule, Oral) Active. Ferrous Sulfate CR (160 (50 Fe)MG Tablet ER, Oral) Active. Omega 3 (1000MG  Capsule, Oral) Active. Potassium (99MG  Tablet, Oral) Active. Vitamin E (400UNIT Capsule, Oral) Active. Medications Reconciled    Vitals (Sonya Bynum CMA; 05/31/2015 2:14 PM) 05/31/2015 2:14 PM Weight: 190 lb Height: 66in Body Surface Area: 2 m Body Mass Index: 30.67 kg/m Temp.: 62F(Temporal)  Pulse: 72 (Regular)  BP: 128/76 (Sitting, Left Arm, Standard)     Physical Exam (Tsuyako Jolley A. Wynn Alldredge MD; 05/31/2015 2:37 PM)  General Mental Status-Alert. General Appearance-Consistent with stated age. Hydration-Well hydrated. Voice-Normal.  Chest and Lung Exam Note: PORT NOTED RIGT UPPER CHEST   Neurologic Neurologic evaluation reveals -alert and oriented x 3 with no impairment of recent or remote memory. Mental Status-Normal.  Musculoskeletal Normal Exam -  Left-Upper Extremity Strength Normal and Lower Extremity Strength Normal. Normal Exam - Right-Upper Extremity Strength Normal, Lower Extremity Weakness.    Assessment & Plan (Marissa Lowrey A. 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 no chlohexadine for prep. can use ETOH and MAC anesthesia. It's been in place for 10 years so higher risk of fragmentation and the need for other procedures. The risk of port removal include bleeding, infection, organ injury, nerve injury , blood clots, worsening of underlying condition, chronic pain open surgery, death, and the need for other operattions. Pt agrees to proceed  Current Plans Pt Education - [facility] Free Text Education/Instructions: discussed with patient and provided information.

## 2015-06-01 ENCOUNTER — Ambulatory Visit
Admission: RE | Admit: 2015-06-01 | Discharge: 2015-06-01 | Disposition: A | Discharge status: Home / Self Care | Payer: Medicare Other | Source: Ambulatory Visit | Attending: Radiation Oncology | Admitting: Radiation Oncology

## 2015-06-01 ENCOUNTER — Encounter: Payer: Self-pay | Admitting: Radiation Oncology

## 2015-06-01 VITALS — BP 147/80 | HR 75 | Resp 16 | Wt 188.4 lb

## 2015-06-01 DIAGNOSIS — Z510 Encounter for antineoplastic radiation therapy: Secondary | ICD-10-CM | POA: Diagnosis not present

## 2015-06-01 DIAGNOSIS — I10 Essential (primary) hypertension: Secondary | ICD-10-CM | POA: Diagnosis not present

## 2015-06-01 DIAGNOSIS — Z170 Estrogen receptor positive status [ER+]: Secondary | ICD-10-CM | POA: Diagnosis not present

## 2015-06-01 DIAGNOSIS — C50311 Malignant neoplasm of lower-inner quadrant of right female breast: Secondary | ICD-10-CM

## 2015-06-01 DIAGNOSIS — Z853 Personal history of malignant neoplasm of breast: Secondary | ICD-10-CM | POA: Diagnosis not present

## 2015-06-01 DIAGNOSIS — C50911 Malignant neoplasm of unspecified site of right female breast: Secondary | ICD-10-CM | POA: Diagnosis not present

## 2015-06-01 DIAGNOSIS — Z8543 Personal history of malignant neoplasm of ovary: Secondary | ICD-10-CM | POA: Diagnosis not present

## 2015-06-01 DIAGNOSIS — D0511 Intraductal carcinoma in situ of right breast: Secondary | ICD-10-CM | POA: Diagnosis not present

## 2015-06-01 NOTE — Progress Notes (Signed)
Weight and vitals stable. Denies pain. No skin changes noted within treatment field. Reports using radiaplex bid as directed. Reports her right breast is a little sore. Denies fatigue.  BP 147/80 mmHg  Pulse 75  Resp 16  Wt 188 lb 6.4 oz (85.458 kg) Wt Readings from Last 3 Encounters:  06/01/15 188 lb 6.4 oz (85.458 kg)  05/28/15 195 lb 12.8 oz (88.814 kg)  05/28/15 193 lb 1.6 oz (87.59 kg)

## 2015-06-01 NOTE — Progress Notes (Signed)
   Department of Radiation Oncology  Phone:  [phone] Fax:        [phone]  Weekly Treatment Note    Name: Monique Watson Date: 06/01/2015 MRN: [id number] DOB: 1940/11/10   Current dose: 27.5 Gy  Current fraction:11   MEDICATIONS: Current Outpatient Prescriptions  Medication Sig Dispense Refill  . Cholecalciferol (VITAMIN D3) 2000 UNITS TABS Take 1 capsule by mouth once a week.     . docusate sodium (COLACE) 100 MG capsule Take 100 mg by mouth daily.    . ferrous fumarate (HEMOCYTE - 106 MG FE) 325 (106 FE) MG TABS Take 1 tablet by mouth every 7 (seven) days. No certain day    . hyaluronate sodium (RADIAPLEXRX) GEL Apply 1 application topically 2 (two) times daily.    . non-metallic deodorant Jethro Poling) MISC Apply 1 application topically daily.    . Omega-3 Fatty Acids (FISH OIL CONCENTRATE) 1000 MG CAPS Take 1,000 mg by mouth daily.     . Potassium 99 MG TABS Take 2 tablets by mouth daily.     . vitamin E (VITAMIN E) 400 UNIT capsule Take 400 Units by mouth 2 (two) times daily.    . tamoxifen (NOLVADEX) 20 MG tablet Take 1 tablet (20 mg total) by mouth daily. (Patient not taking: Reported on 06/01/2015) 90 tablet 1   No current facility-administered medications for this encounter.     ALLERGIES: Chlorhexidine; Arimidex; Diovan; and Uncoded nonscreenable allergen   LABORATORY DATA:  Lab Results  Component Value Date   WBC 5.5 05/27/2015   HGB 12.7 05/27/2015   HCT 37.4 05/27/2015   MCV 97.8 05/27/2015   PLT 111* 05/27/2015   Lab Results  Component Value Date   NA 144 05/27/2015   K 3.8 05/27/2015   CL 108 04/06/2015   CO2 24 05/27/2015   Lab Results  Component Value Date   ALT 13 05/27/2015   AST 18 05/27/2015   ALKPHOS 56 05/27/2015   BILITOT 0.70 05/27/2015     NARRATIVE: Monique Watson was seen today for weekly treatment management. The chart was checked and the patient's films were reviewed.  Weight and vitals stable. Denies pain. No skin  changes noted within treatment field. Reports using radiaplex bid as directed. Reports her right breast is a little sore. Denies fatigue.  BP 147/80 mmHg  Pulse 75  Resp 16  Wt 188 lb 6.4 oz (85.458 kg) Wt Readings from Last 3 Encounters:  06/01/15 188 lb 6.4 oz (85.458 kg)  05/28/15 195 lb 12.8 oz (88.814 kg)  05/28/15 193 lb 1.6 oz (87.59 kg)     PHYSICAL EXAMINATION: weight is 188 lb 6.4 oz (85.458 kg). Her blood pressure is 147/80 and her pulse is 75. Her respiration is 16.      The patient's skin shows mild hyperpigmentation  ASSESSMENT: The patient is doing satisfactorily with treatment.  PLAN: We will continue with the patient's radiation treatment as planned.

## 2015-06-02 ENCOUNTER — Encounter: Payer: Self-pay | Admitting: Radiation Oncology

## 2015-06-02 ENCOUNTER — Ambulatory Visit
Admission: RE | Admit: 2015-06-02 | Discharge: 2015-06-02 | Disposition: A | Discharge status: Home / Self Care | Payer: Medicare Other | Source: Ambulatory Visit | Attending: Radiation Oncology | Admitting: Radiation Oncology

## 2015-06-02 DIAGNOSIS — D0511 Intraductal carcinoma in situ of right breast: Secondary | ICD-10-CM | POA: Diagnosis not present

## 2015-06-02 DIAGNOSIS — I10 Essential (primary) hypertension: Secondary | ICD-10-CM | POA: Diagnosis not present

## 2015-06-02 DIAGNOSIS — Z8543 Personal history of malignant neoplasm of ovary: Secondary | ICD-10-CM | POA: Diagnosis not present

## 2015-06-02 DIAGNOSIS — C50911 Malignant neoplasm of unspecified site of right female breast: Secondary | ICD-10-CM | POA: Diagnosis not present

## 2015-06-02 DIAGNOSIS — Z853 Personal history of malignant neoplasm of breast: Secondary | ICD-10-CM | POA: Diagnosis not present

## 2015-06-02 DIAGNOSIS — Z510 Encounter for antineoplastic radiation therapy: Secondary | ICD-10-CM | POA: Diagnosis not present

## 2015-06-02 DIAGNOSIS — Z170 Estrogen receptor positive status [ER+]: Secondary | ICD-10-CM | POA: Diagnosis not present

## 2015-06-03 ENCOUNTER — Ambulatory Visit
Admission: RE | Admit: 2015-06-03 | Discharge: 2015-06-03 | Disposition: A | Discharge status: Home / Self Care | Payer: Medicare Other | Source: Ambulatory Visit | Attending: Radiation Oncology | Admitting: Radiation Oncology

## 2015-06-03 DIAGNOSIS — Z510 Encounter for antineoplastic radiation therapy: Secondary | ICD-10-CM | POA: Diagnosis not present

## 2015-06-03 DIAGNOSIS — Z8543 Personal history of malignant neoplasm of ovary: Secondary | ICD-10-CM | POA: Diagnosis not present

## 2015-06-03 DIAGNOSIS — I10 Essential (primary) hypertension: Secondary | ICD-10-CM | POA: Diagnosis not present

## 2015-06-03 DIAGNOSIS — C50911 Malignant neoplasm of unspecified site of right female breast: Secondary | ICD-10-CM | POA: Diagnosis not present

## 2015-06-03 DIAGNOSIS — Z853 Personal history of malignant neoplasm of breast: Secondary | ICD-10-CM | POA: Diagnosis not present

## 2015-06-03 DIAGNOSIS — D0511 Intraductal carcinoma in situ of right breast: Secondary | ICD-10-CM | POA: Diagnosis not present

## 2015-06-03 DIAGNOSIS — Z170 Estrogen receptor positive status [ER+]: Secondary | ICD-10-CM | POA: Diagnosis not present

## 2015-06-04 ENCOUNTER — Ambulatory Visit
Admission: RE | Admit: 2015-06-04 | Discharge: 2015-06-04 | Disposition: A | Discharge status: Home / Self Care | Payer: Medicare Other | Source: Ambulatory Visit | Attending: Radiation Oncology | Admitting: Radiation Oncology

## 2015-06-04 DIAGNOSIS — Z853 Personal history of malignant neoplasm of breast: Secondary | ICD-10-CM | POA: Diagnosis not present

## 2015-06-04 DIAGNOSIS — Z8543 Personal history of malignant neoplasm of ovary: Secondary | ICD-10-CM | POA: Diagnosis not present

## 2015-06-04 DIAGNOSIS — D0511 Intraductal carcinoma in situ of right breast: Secondary | ICD-10-CM | POA: Diagnosis not present

## 2015-06-04 DIAGNOSIS — Z170 Estrogen receptor positive status [ER+]: Secondary | ICD-10-CM | POA: Diagnosis not present

## 2015-06-04 DIAGNOSIS — I10 Essential (primary) hypertension: Secondary | ICD-10-CM | POA: Diagnosis not present

## 2015-06-04 DIAGNOSIS — Z510 Encounter for antineoplastic radiation therapy: Secondary | ICD-10-CM | POA: Diagnosis not present

## 2015-06-04 DIAGNOSIS — C50911 Malignant neoplasm of unspecified site of right female breast: Secondary | ICD-10-CM | POA: Diagnosis not present

## 2015-06-07 ENCOUNTER — Ambulatory Visit
Admission: RE | Admit: 2015-06-07 | Discharge: 2015-06-07 | Disposition: A | Discharge status: Home / Self Care | Payer: Medicare Other | Source: Ambulatory Visit | Attending: Radiation Oncology | Admitting: Radiation Oncology

## 2015-06-07 DIAGNOSIS — Z8543 Personal history of malignant neoplasm of ovary: Secondary | ICD-10-CM | POA: Diagnosis not present

## 2015-06-07 DIAGNOSIS — C50911 Malignant neoplasm of unspecified site of right female breast: Secondary | ICD-10-CM | POA: Diagnosis not present

## 2015-06-07 DIAGNOSIS — Z510 Encounter for antineoplastic radiation therapy: Secondary | ICD-10-CM | POA: Diagnosis not present

## 2015-06-07 DIAGNOSIS — I10 Essential (primary) hypertension: Secondary | ICD-10-CM | POA: Diagnosis not present

## 2015-06-07 DIAGNOSIS — Z170 Estrogen receptor positive status [ER+]: Secondary | ICD-10-CM | POA: Diagnosis not present

## 2015-06-07 DIAGNOSIS — Z853 Personal history of malignant neoplasm of breast: Secondary | ICD-10-CM | POA: Diagnosis not present

## 2015-06-07 DIAGNOSIS — D0511 Intraductal carcinoma in situ of right breast: Secondary | ICD-10-CM | POA: Diagnosis not present

## 2015-06-08 ENCOUNTER — Other Ambulatory Visit: Payer: Self-pay | Admitting: Oncology

## 2015-06-08 ENCOUNTER — Ambulatory Visit: Payer: Medicare Other

## 2015-06-08 DIAGNOSIS — I10 Essential (primary) hypertension: Secondary | ICD-10-CM | POA: Diagnosis not present

## 2015-06-08 DIAGNOSIS — Z510 Encounter for antineoplastic radiation therapy: Secondary | ICD-10-CM | POA: Diagnosis not present

## 2015-06-08 DIAGNOSIS — Z170 Estrogen receptor positive status [ER+]: Secondary | ICD-10-CM | POA: Diagnosis not present

## 2015-06-08 DIAGNOSIS — Z8543 Personal history of malignant neoplasm of ovary: Secondary | ICD-10-CM | POA: Diagnosis not present

## 2015-06-08 DIAGNOSIS — C50911 Malignant neoplasm of unspecified site of right female breast: Secondary | ICD-10-CM | POA: Diagnosis not present

## 2015-06-08 DIAGNOSIS — D0511 Intraductal carcinoma in situ of right breast: Secondary | ICD-10-CM | POA: Diagnosis not present

## 2015-06-08 DIAGNOSIS — Z853 Personal history of malignant neoplasm of breast: Secondary | ICD-10-CM | POA: Diagnosis not present

## 2015-06-09 ENCOUNTER — Ambulatory Visit (HOSPITAL_BASED_OUTPATIENT_CLINIC_OR_DEPARTMENT_OTHER): Payer: Medicare Other

## 2015-06-09 ENCOUNTER — Ambulatory Visit: Payer: Medicare Other

## 2015-06-09 ENCOUNTER — Ambulatory Visit
Admission: RE | Admit: 2015-06-09 | Discharge: 2015-06-09 | Disposition: A | Discharge status: Home / Self Care | Payer: Medicare Other | Source: Ambulatory Visit | Attending: Radiation Oncology | Admitting: Radiation Oncology

## 2015-06-09 VITALS — BP 163/68 | HR 71 | Temp 98.4°F

## 2015-06-09 DIAGNOSIS — Z170 Estrogen receptor positive status [ER+]: Secondary | ICD-10-CM | POA: Diagnosis not present

## 2015-06-09 DIAGNOSIS — I10 Essential (primary) hypertension: Secondary | ICD-10-CM | POA: Diagnosis not present

## 2015-06-09 DIAGNOSIS — Z510 Encounter for antineoplastic radiation therapy: Secondary | ICD-10-CM | POA: Diagnosis not present

## 2015-06-09 DIAGNOSIS — Z23 Encounter for immunization: Secondary | ICD-10-CM

## 2015-06-09 DIAGNOSIS — C50911 Malignant neoplasm of unspecified site of right female breast: Secondary | ICD-10-CM | POA: Diagnosis not present

## 2015-06-09 DIAGNOSIS — Z8543 Personal history of malignant neoplasm of ovary: Secondary | ICD-10-CM | POA: Diagnosis not present

## 2015-06-09 DIAGNOSIS — D0511 Intraductal carcinoma in situ of right breast: Secondary | ICD-10-CM | POA: Diagnosis not present

## 2015-06-09 DIAGNOSIS — Z853 Personal history of malignant neoplasm of breast: Secondary | ICD-10-CM | POA: Diagnosis not present

## 2015-06-09 MED ORDER — INFLUENZA VAC SPLIT QUAD 0.5 ML IM SUSY
0.5000 mL | PREFILLED_SYRINGE | Freq: Once | INTRAMUSCULAR | Status: AC
Start: 2015-06-09 — End: 2015-06-09
  Administered 2015-06-09: 0.5 mL via INTRAMUSCULAR
  Filled 2015-06-09: qty 0.5

## 2015-06-10 ENCOUNTER — Ambulatory Visit: Payer: Medicare Other

## 2015-06-10 DIAGNOSIS — Z853 Personal history of malignant neoplasm of breast: Secondary | ICD-10-CM | POA: Diagnosis not present

## 2015-06-10 DIAGNOSIS — Z510 Encounter for antineoplastic radiation therapy: Secondary | ICD-10-CM | POA: Diagnosis not present

## 2015-06-10 DIAGNOSIS — Z8543 Personal history of malignant neoplasm of ovary: Secondary | ICD-10-CM | POA: Diagnosis not present

## 2015-06-10 DIAGNOSIS — Z170 Estrogen receptor positive status [ER+]: Secondary | ICD-10-CM | POA: Diagnosis not present

## 2015-06-10 DIAGNOSIS — I10 Essential (primary) hypertension: Secondary | ICD-10-CM | POA: Diagnosis not present

## 2015-06-10 DIAGNOSIS — D0511 Intraductal carcinoma in situ of right breast: Secondary | ICD-10-CM | POA: Diagnosis not present

## 2015-06-11 ENCOUNTER — Ambulatory Visit: Payer: Medicare Other

## 2015-06-11 ENCOUNTER — Ambulatory Visit
Admission: RE | Admit: 2015-06-11 | Discharge: 2015-06-11 | Disposition: A | Discharge status: Home / Self Care | Payer: Medicare Other | Source: Ambulatory Visit | Attending: Radiation Oncology | Admitting: Radiation Oncology

## 2015-06-11 VITALS — BP 128/88 | HR 71 | Temp 98.9°F | Wt 194.1 lb

## 2015-06-11 DIAGNOSIS — Z510 Encounter for antineoplastic radiation therapy: Secondary | ICD-10-CM | POA: Diagnosis not present

## 2015-06-11 DIAGNOSIS — D0511 Intraductal carcinoma in situ of right breast: Secondary | ICD-10-CM | POA: Diagnosis not present

## 2015-06-11 DIAGNOSIS — Z8543 Personal history of malignant neoplasm of ovary: Secondary | ICD-10-CM | POA: Diagnosis not present

## 2015-06-11 DIAGNOSIS — C50311 Malignant neoplasm of lower-inner quadrant of right female breast: Secondary | ICD-10-CM

## 2015-06-11 DIAGNOSIS — I10 Essential (primary) hypertension: Secondary | ICD-10-CM | POA: Diagnosis not present

## 2015-06-11 DIAGNOSIS — Z853 Personal history of malignant neoplasm of breast: Secondary | ICD-10-CM | POA: Diagnosis not present

## 2015-06-11 DIAGNOSIS — Z170 Estrogen receptor positive status [ER+]: Secondary | ICD-10-CM | POA: Diagnosis not present

## 2015-06-11 NOTE — Progress Notes (Signed)
Weekly assessment of radiation tor right breast.Completd 19 of 20 treatments.Skin is mildly tanned without peeling.Denies pain.Given another tube of radiaplex to continue application  2 to 3 times daily.Call if any questions or concerns.Given card to schedule one month follow up. BP 128/88 mmHg  Pulse 71  Temp(Src) 98.9 F (37.2 C)  Wt 194 lb 1.6 oz (88.043 kg)

## 2015-06-11 NOTE — Progress Notes (Signed)
   Department of Radiation Oncology  Phone:  [phone] Fax:        [phone]  Weekly Treatment Note    Name: Monique Watson Date: 06/11/2015 MRN: [id number] DOB: 28-Jan-1941   Current dose: 47.5 Gy  Current fraction:19   MEDICATIONS: Current Outpatient Prescriptions  Medication Sig Dispense Refill  . Cholecalciferol (VITAMIN D3) 2000 UNITS TABS Take 1 capsule by mouth once a week.     . docusate sodium (COLACE) 100 MG capsule Take 100 mg by mouth daily.    . ferrous fumarate (HEMOCYTE - 106 MG FE) 325 (106 FE) MG TABS Take 1 tablet by mouth every 7 (seven) days. No certain day    . hyaluronate sodium (RADIAPLEXRX) GEL Apply 1 application topically 2 (two) times daily.    . non-metallic deodorant Jethro Poling) MISC Apply 1 application topically daily.    . Omega-3 Fatty Acids (FISH OIL CONCENTRATE) 1000 MG CAPS Take 1,000 mg by mouth daily.     . Potassium 99 MG TABS Take 2 tablets by mouth daily.     . tamoxifen (NOLVADEX) 20 MG tablet Take 1 tablet (20 mg total) by mouth daily. 90 tablet 1  . vitamin E (VITAMIN E) 400 UNIT capsule Take 400 Units by mouth 2 (two) times daily.     No current facility-administered medications for this encounter.     ALLERGIES: Chlorhexidine; Arimidex; Diovan; and Uncoded nonscreenable allergen   LABORATORY DATA:  Lab Results  Component Value Date   WBC 5.5 05/27/2015   HGB 12.7 05/27/2015   HCT 37.4 05/27/2015   MCV 97.8 05/27/2015   PLT 111* 05/27/2015   Lab Results  Component Value Date   NA 144 05/27/2015   K 3.8 05/27/2015   CL 108 04/06/2015   CO2 24 05/27/2015   Lab Results  Component Value Date   ALT 13 05/27/2015   AST 18 05/27/2015   ALKPHOS 56 05/27/2015   BILITOT 0.70 05/27/2015     NARRATIVE: Monique Watson was seen today for weekly treatment management. The chart was checked and the patient's films were reviewed.  Weekly assessment of radiation tor right breast.Completd 19 of 20 treatments.Skin is mildly  tanned without peeling.Denies pain.Given another tube of radiaplex to continue application  2 to 3 times daily.Call if any questions or concerns.Given card to schedule one month follow up. BP 128/88 mmHg  Pulse 71  Temp(Src) 98.9 F (37.2 C)  Wt 194 lb 1.6 oz (88.043 kg)  PHYSICAL EXAMINATION: weight is 194 lb 1.6 oz (88.043 kg). Her temperature is 98.9 F (37.2 C). Her blood pressure is 128/88 and her pulse is 71.      The patient's skin looks quite good with some moderate hyperpigmentation present  ASSESSMENT: The patient is doing satisfactorily with treatment.  PLAN: We will continue with the patient's radiation treatment as planned.

## 2015-06-14 ENCOUNTER — Ambulatory Visit: Payer: Medicare Other

## 2015-06-14 ENCOUNTER — Ambulatory Visit
Admission: RE | Admit: 2015-06-14 | Discharge: 2015-06-14 | Disposition: A | Discharge status: Home / Self Care | Payer: Medicare Other | Source: Ambulatory Visit | Attending: Radiation Oncology | Admitting: Radiation Oncology

## 2015-06-14 ENCOUNTER — Encounter: Payer: Self-pay | Admitting: Radiation Oncology

## 2015-06-14 DIAGNOSIS — D0511 Intraductal carcinoma in situ of right breast: Secondary | ICD-10-CM

## 2015-06-14 DIAGNOSIS — Z510 Encounter for antineoplastic radiation therapy: Secondary | ICD-10-CM | POA: Diagnosis not present

## 2015-06-14 DIAGNOSIS — Z170 Estrogen receptor positive status [ER+]: Secondary | ICD-10-CM | POA: Diagnosis not present

## 2015-06-14 DIAGNOSIS — Z853 Personal history of malignant neoplasm of breast: Secondary | ICD-10-CM | POA: Diagnosis not present

## 2015-06-14 DIAGNOSIS — Z8543 Personal history of malignant neoplasm of ovary: Secondary | ICD-10-CM | POA: Diagnosis not present

## 2015-06-14 DIAGNOSIS — I10 Essential (primary) hypertension: Secondary | ICD-10-CM | POA: Diagnosis not present

## 2015-06-14 MED ORDER — RADIAPLEXRX EX GEL
Freq: Once | CUTANEOUS | Status: AC
  Administered 2015-06-14: 17:00:00 via TOPICAL

## 2015-06-15 ENCOUNTER — Other Ambulatory Visit: Payer: Self-pay | Admitting: Adult Health

## 2015-06-15 DIAGNOSIS — C50311 Malignant neoplasm of lower-inner quadrant of right female breast: Secondary | ICD-10-CM

## 2015-06-24 NOTE — Progress Notes (Signed)
  Radiation Oncology         [phone] ________________________________  Name: SATHVIKA OJO MRN: [id number]  Date: 06/02/2015  DOB: August 14, 1941  Complex simulation note  The patient has undergone complex simulation for her upcoming boost treatment for her diagnosis of breast cancer. The patient has initially been planned to receive 42.5 Gy. The patient will now receive a 7.5 Gy boost to the seroma cavity which has been contoured. This will be accomplished using an en face electron field. Based on the depth of the target area, 15 MeV electrons will be used. The patient's final total dose therefore will be 50 Gy. A special port plan is requested for the boost treatment.   _______________________________  Jodelle Gross, MD, PhD

## 2015-06-24 NOTE — Progress Notes (Signed)
  Radiation Oncology         [phone] ________________________________  Name: Monique Watson MRN: [id number]  Date: 06/14/2015  DOB: [birth year]  End of Treatment Note  Diagnosis:   Right-sided breast cancer     Indication for treatment:  Curative       Radiation treatment dates:   08/29/2016through09/26/2016  Site/dose:   The patient initially received a dose of 42.5 Gy in 17 fractions to the breast using whole-breast tangent fields. This was delivered using a 3-D conformal technique. The patient then received a boost to the seroma. This delivered an additional 7.5 Gy in 3 fractions using an en face electron field due to the depth of the seroma. The total dose was 50 Gy.  Narrative: The patient tolerated radiation treatment relatively well.   The patient had some expected skin irritation as she progressed during treatment. Moist desquamation was not present at the end of treatment.  Plan: The patient has completed radiation treatment. The patient will return to radiation oncology clinic for routine followup in one month. I advised the patient to call or return sooner if they have any questions or concerns related to their recovery or treatment. ________________________________  Jodelle Gross, M.D., Ph.D.

## 2015-07-02 ENCOUNTER — Ambulatory Visit: Payer: Medicare Other | Attending: Gynecology | Admitting: Gynecology

## 2015-07-05 ENCOUNTER — Ambulatory Visit: Payer: Medicare Other | Admitting: Oncology

## 2015-07-05 ENCOUNTER — Other Ambulatory Visit: Payer: Medicare Other

## 2015-07-06 ENCOUNTER — Encounter (HOSPITAL_BASED_OUTPATIENT_CLINIC_OR_DEPARTMENT_OTHER): Payer: Self-pay | Admitting: *Deleted

## 2015-07-12 ENCOUNTER — Ambulatory Visit
Admission: RE | Admit: 2015-07-12 | Discharge: 2015-07-12 | Disposition: A | Discharge status: Home / Self Care | Payer: Medicare Other | Source: Ambulatory Visit | Attending: Radiation Oncology | Admitting: Radiation Oncology

## 2015-07-12 ENCOUNTER — Encounter: Payer: Self-pay | Admitting: Radiation Oncology

## 2015-07-12 VITALS — BP 181/83 | HR 82 | Temp 97.6°F | Resp 16 | Ht 66.0 in | Wt 192.6 lb

## 2015-07-12 DIAGNOSIS — C50311 Malignant neoplasm of lower-inner quadrant of right female breast: Secondary | ICD-10-CM

## 2015-07-12 NOTE — Progress Notes (Signed)
  Radiation Oncology         [phone] ________________________________  Name: Monique Watson MRN: [id number]  Date: 07/12/2015  DOB: [birth year]  Follow-Up Visit Note  CC: Gerrit Heck, MD  Erroll Luna, MD  Diagnosis:   Right-sided breast cancer         Radiation treatment dates:   05/17/2015 through 06/14/2015  Narrative:  Follow up Right breast sided breast 05/17/15-06/14/15 Breast well healed, still slight tanning, Appetite good, energy level okay but does take occasional naps;, sees Dr. [name] 07/22/15, Survivorship appt 08/31/15, Takes tamoxifen 20mg  daily occasional hot flashes                               ALLERGIES:  is allergic to chlorhexidine; arimidex; diovan; and uncoded nonscreenable allergen.  Meds: Current Outpatient Prescriptions  Medication Sig Dispense Refill  . Cholecalciferol (VITAMIN D3) 2000 UNITS TABS Take 1 capsule by mouth daily.     Marland Kitchen docusate sodium (COLACE) 100 MG capsule Take 100 mg by mouth daily.    . ferrous fumarate (HEMOCYTE - 106 MG FE) 325 (106 FE) MG TABS Take 1 tablet by mouth every 7 (seven) days. No certain day    . non-metallic deodorant (ALRA) MISC Apply 1 application topically daily.    . Omega-3 Fatty Acids (FISH OIL CONCENTRATE) 1000 MG CAPS Take 1,000 mg by mouth daily.     . Potassium 99 MG TABS Take 2 tablets by mouth daily.     . tamoxifen (NOLVADEX) 20 MG tablet Take 1 tablet (20 mg total) by mouth daily. 90 tablet 1  . vitamin E (VITAMIN E) 400 UNIT capsule Take 400 Units by mouth 2 (two) times daily.    . hyaluronate sodium (RADIAPLEXRX) GEL Apply 1 application topically 2 (two) times daily.     No current facility-administered medications for this encounter.    Physical Findings: The patient is in no acute distress. Patient is alert and oriented.  height is 5\' 6"  (1.676 m) and weight is 192 lb 9.6 oz (87.363 kg). Her oral temperature is 97.6 F (36.4 C). Her blood pressure is 181/83 and her pulse is 82. Her  respiration is 16. .   General: Well-developed, in no acute distress HEENT: Normocephalic, atraumatic Extremities: No edema present Breast: Mild hyperpigmentation, with a little dry desquamation remaining in the inframammary region. Overall skin is healing well.   Lab Findings: Lab Results  Component Value Date   WBC 5.5 05/27/2015   HGB 12.7 05/27/2015   HCT 37.4 05/27/2015   MCV 97.8 05/27/2015   PLT 111* 05/27/2015    Radiographic Findings: No results found.  Impression: The patient is recovering from the effects of radiation.   Plan: Follow up with radiation oncology on PRN basis. Continue tamoxifen and follow up with Dr.Livesay.    ------------------------------------------------  Jodelle Gross, MD, PhD  This document serves as a record of services personally performed by Kyung Rudd, MD. It was created on his behalf by Derek Mound, a trained medical scribe. The creation of this record is based on the scribe's personal observations and the provider's statements to them. This document has been checked and approved by the attending provider.

## 2015-07-12 NOTE — Progress Notes (Signed)
Follow up  Right breast sided breast 05/17/15-06/14/15  Breast well healed, still slight tanning,  Appetite good, energy level okay but does take occasional naps;, sees Dr. [name] 07/22/15, Survivorship appt 08/31/15,  Takes tamoxifen 20mg  daily occasional hot flashes BP 181/83 mmHg  Pulse 82  Temp(Src) 97.6 F (36.4 C) (Oral)  Resp 16  Ht 5\' 6"  (1.676 m)  Wt 192 lb 9.6 oz (87.363 kg)  BMI 31.10 kg/m2  Wt Readings from Last 3 Encounters:  07/12/15 192 lb 9.6 oz (87.363 kg)  07/06/15 194 lb (87.998 kg)  06/11/15 194 lb 1.6 oz (88.043 kg)   8:14 AM

## 2015-07-13 ENCOUNTER — Encounter (HOSPITAL_BASED_OUTPATIENT_CLINIC_OR_DEPARTMENT_OTHER): Payer: Self-pay | Admitting: Anesthesiology

## 2015-07-13 ENCOUNTER — Ambulatory Visit (HOSPITAL_BASED_OUTPATIENT_CLINIC_OR_DEPARTMENT_OTHER)
Admission: RE | Admit: 2015-07-13 | Discharge: 2015-07-13 | Disposition: A | Discharge status: Home / Self Care | Payer: Medicare Other | Source: Ambulatory Visit | Attending: Surgery | Admitting: Surgery

## 2015-07-13 ENCOUNTER — Ambulatory Visit (HOSPITAL_BASED_OUTPATIENT_CLINIC_OR_DEPARTMENT_OTHER): Payer: Medicare Other | Admitting: Anesthesiology

## 2015-07-13 ENCOUNTER — Encounter (HOSPITAL_BASED_OUTPATIENT_CLINIC_OR_DEPARTMENT_OTHER)
Admission: RE | Disposition: A | Discharge status: Home / Self Care | Payer: Self-pay | Source: Ambulatory Visit | Attending: Surgery

## 2015-07-13 DIAGNOSIS — Z452 Encounter for adjustment and management of vascular access device: Secondary | ICD-10-CM | POA: Diagnosis not present

## 2015-07-13 DIAGNOSIS — Z9221 Personal history of antineoplastic chemotherapy: Secondary | ICD-10-CM | POA: Diagnosis not present

## 2015-07-13 DIAGNOSIS — I10 Essential (primary) hypertension: Secondary | ICD-10-CM | POA: Diagnosis not present

## 2015-07-13 DIAGNOSIS — Z87891 Personal history of nicotine dependence: Secondary | ICD-10-CM | POA: Diagnosis not present

## 2015-07-13 HISTORY — PX: PORT-A-CATH REMOVAL: SHX5289

## 2015-07-13 SURGERY — REMOVAL PORT-A-CATH
Anesthesia: Monitor Anesthesia Care | Site: Chest

## 2015-07-13 MED ORDER — GLYCOPYRROLATE 0.2 MG/ML IJ SOLN: 0.2000 mg | Freq: Once | INTRAMUSCULAR | Status: DC | PRN

## 2015-07-13 MED ORDER — BUPIVACAINE-EPINEPHRINE (PF) 0.25% -1:200000 IJ SOLN
INTRAMUSCULAR | Status: AC
  Filled 2015-07-13: qty 120

## 2015-07-13 MED ORDER — PROPOFOL 500 MG/50ML IV EMUL
INTRAVENOUS | Status: DC | PRN
  Administered 2015-07-13: 20 ug/kg/min via INTRAVENOUS

## 2015-07-13 MED ORDER — ONDANSETRON HCL 4 MG/2ML IJ SOLN
INTRAMUSCULAR | Status: AC
  Filled 2015-07-13: qty 2

## 2015-07-13 MED ORDER — BUPIVACAINE-EPINEPHRINE 0.25% -1:200000 IJ SOLN
INTRAMUSCULAR | Status: DC | PRN
  Administered 2015-07-13: 17 mL

## 2015-07-13 MED ORDER — CEFAZOLIN SODIUM-DEXTROSE 2-3 GM-% IV SOLR
INTRAVENOUS | Status: AC
  Filled 2015-07-13: qty 50

## 2015-07-13 MED ORDER — PROPOFOL 10 MG/ML IV BOLUS
INTRAVENOUS | Status: AC
  Filled 2015-07-13: qty 20

## 2015-07-13 MED ORDER — DEXAMETHASONE SODIUM PHOSPHATE 10 MG/ML IJ SOLN
INTRAMUSCULAR | Status: AC
  Filled 2015-07-13: qty 1

## 2015-07-13 MED ORDER — FENTANYL CITRATE (PF) 100 MCG/2ML IJ SOLN
50.0000 ug | INTRAMUSCULAR | Status: DC | PRN
  Administered 2015-07-13 (×2): 25 ug via INTRAVENOUS

## 2015-07-13 MED ORDER — DEXTROSE 5 % IV SOLN
3.0000 g | INTRAVENOUS | Status: AC
  Administered 2015-07-13: 2 g via INTRAVENOUS

## 2015-07-13 MED ORDER — MIDAZOLAM HCL 2 MG/2ML IJ SOLN: 1.0000 mg | INTRAMUSCULAR | Status: DC | PRN

## 2015-07-13 MED ORDER — LIDOCAINE HCL (CARDIAC) 20 MG/ML IV SOLN
INTRAVENOUS | Status: DC | PRN
  Administered 2015-07-13: 10 mg via INTRAVENOUS

## 2015-07-13 MED ORDER — FENTANYL CITRATE (PF) 100 MCG/2ML IJ SOLN
INTRAMUSCULAR | Status: AC
  Filled 2015-07-13: qty 4

## 2015-07-13 MED ORDER — SCOPOLAMINE 1 MG/3DAYS TD PT72: 1.0000 | MEDICATED_PATCH | Freq: Once | TRANSDERMAL | Status: DC | PRN

## 2015-07-13 MED ORDER — ONDANSETRON HCL 4 MG/2ML IJ SOLN
INTRAMUSCULAR | Status: DC | PRN
  Administered 2015-07-13: 4 mg via INTRAVENOUS

## 2015-07-13 MED ORDER — ONDANSETRON HCL 4 MG/2ML IJ SOLN: 4.0000 mg | Freq: Once | INTRAMUSCULAR | Status: DC | PRN

## 2015-07-13 MED ORDER — LACTATED RINGERS IV SOLN
INTRAVENOUS | Status: DC
  Administered 2015-07-13: 10:00:00 via INTRAVENOUS

## 2015-07-13 MED ORDER — HYDROCODONE-ACETAMINOPHEN 5-325 MG PO TABS: 1.0000 | ORAL_TABLET | Freq: Four times a day (QID) | ORAL | Status: DC | PRN

## 2015-07-13 MED ORDER — HYDROMORPHONE HCL 1 MG/ML IJ SOLN: 0.2500 mg | INTRAMUSCULAR | Status: DC | PRN

## 2015-07-13 MED ORDER — LIDOCAINE HCL (CARDIAC) 20 MG/ML IV SOLN
INTRAVENOUS | Status: AC
  Filled 2015-07-13: qty 5

## 2015-07-13 MED ORDER — MEPERIDINE HCL 25 MG/ML IJ SOLN: 6.2500 mg | INTRAMUSCULAR | Status: DC | PRN

## 2015-07-13 SURGICAL SUPPLY — 29 items
BLADE SURG 15 STRL LF DISP TIS (BLADE) ×1 IMPLANT
BLADE SURG 15 STRL SS (BLADE) ×1
CHLORAPREP W/TINT 26ML (MISCELLANEOUS) ×2 IMPLANT
COVER BACK TABLE 60X90IN (DRAPES) ×2 IMPLANT
COVER MAYO STAND STRL (DRAPES) ×2 IMPLANT
DECANTER SPIKE VIAL GLASS SM (MISCELLANEOUS) IMPLANT
DRAPE LAPAROTOMY 100X72 PEDS (DRAPES) ×2 IMPLANT
DRAPE UTILITY XL STRL (DRAPES) ×2 IMPLANT
ELECT REM PT RETURN 9FT ADLT (ELECTROSURGICAL) ×2
ELECTRODE REM PT RTRN 9FT ADLT (ELECTROSURGICAL) ×1 IMPLANT
GLOVE BIO SURGEON STRL SZ 6.5 (GLOVE) ×2 IMPLANT
GLOVE BIOGEL PI IND STRL 7.0 (GLOVE) ×1 IMPLANT
GLOVE BIOGEL PI IND STRL 8 (GLOVE) ×1 IMPLANT
GLOVE BIOGEL PI INDICATOR 7.0 (GLOVE) ×1
GLOVE BIOGEL PI INDICATOR 8 (GLOVE) ×1
GLOVE ECLIPSE 8.0 STRL XLNG CF (GLOVE) ×2 IMPLANT
GOWN STRL REUS W/ TWL LRG LVL3 (GOWN DISPOSABLE) ×2 IMPLANT
GOWN STRL REUS W/TWL LRG LVL3 (GOWN DISPOSABLE) ×2
LIQUID BAND (GAUZE/BANDAGES/DRESSINGS) ×2 IMPLANT
NEEDLE HYPO 25X1 1.5 SAFETY (NEEDLE) ×2 IMPLANT
NS IRRIG 1000ML POUR BTL (IV SOLUTION) ×2 IMPLANT
PACK BASIN DAY SURGERY FS (CUSTOM PROCEDURE TRAY) ×2 IMPLANT
PENCIL BUTTON HOLSTER BLD 10FT (ELECTRODE) IMPLANT
SLEEVE SCD COMPRESS KNEE MED (MISCELLANEOUS) ×2 IMPLANT
SPONGE LAP 4X18 X RAY DECT (DISPOSABLE) ×2 IMPLANT
SUT MON AB 4-0 PC3 18 (SUTURE) ×2 IMPLANT
SUT VICRYL 3-0 CR8 SH (SUTURE) ×2 IMPLANT
SYR CONTROL 10ML LL (SYRINGE) ×2 IMPLANT
TOWEL OR 17X24 6PK STRL BLUE (TOWEL DISPOSABLE) ×2 IMPLANT

## 2015-07-13 NOTE — Discharge Instructions (Signed)
GENERAL SURGERY: POST OP INSTRUCTIONS ° °1. DIET: Follow a light bland diet the first 24 hours after arrival home, such as soup, liquids, crackers, etc.  Be sure to include lots of fluids daily.  Avoid fast food or heavy meals as your are more likely to get nauseated.   °2. Take your usually prescribed home medications unless otherwise directed. °3. PAIN CONTROL: °a. Pain is best controlled by a usual combination of three different methods TOGETHER: °i. Ice/Heat °ii. Over the counter pain medication °iii. Prescription pain medication °b. Most patients will experience some swelling and bruising around the incisions.  Ice packs or heating pads (30-60 minutes up to 6 times a day) will help. Use ice for the first few days to help decrease swelling and bruising, then switch to heat to help relax tight/sore spots and speed recovery.  Some people prefer to use ice alone, heat alone, alternating between ice & heat.  Experiment to what works for you.  Swelling and bruising can take several weeks to resolve.   °c. It is helpful to take an over-the-counter pain medication regularly for the first few weeks.  Choose one of the following that works best for you: °i. Naproxen (Aleve, etc)  Two 220mg tabs twice a day °ii. Ibuprofen (Advil, etc) Three 200mg tabs four times a day (every meal & bedtime) °iii. Acetaminophen (Tylenol, etc) 500-650mg four times a day (every meal & bedtime) °d. A  prescription for pain medication (such as oxycodone, hydrocodone, etc) should be given to you upon discharge.  Take your pain medication as prescribed.  °i. If you are having problems/concerns with the prescription medicine (does not control pain, nausea, vomiting, rash, itching, etc), please call us [phone] to see if we need to switch you to a different pain medicine that will work better for you and/or control your side effect better. °ii. If you need a refill on your pain medication, please contact your pharmacy.  They will contact our  office to request authorization. Prescriptions will not be filled after 5 pm or on week-ends. °4. Avoid getting constipated.  Between the surgery and the pain medications, it is common to experience some constipation.  Increasing fluid intake and taking a fiber supplement (such as Metamucil, Citrucel, FiberCon, MiraLax, etc) 1-2 times a day regularly will usually help prevent this problem from occurring.  A mild laxative (prune juice, Milk of Magnesia, MiraLax, etc) should be taken according to package directions if there are no bowel movements after 48 hours.   °5. Wash / shower every day.  You may shower over the dressings as they are waterproof.  Continue to shower over incision(s) after the dressing is off. °6. Remove your waterproof bandages 5 days after surgery.  You may leave the incision open to air.  You may have skin tapes (Steri Strips) covering the incision(s).  Leave them on until one week, then remove.  You may replace a dressing/Band-Aid to cover the incision for comfort if you wish.  ° ° ° ° °7. ACTIVITIES as tolerated:   °a. You may resume regular (light) daily activities beginning the next day--such as daily self-care, walking, climbing stairs--gradually increasing activities as tolerated.  If you can walk 30 minutes without difficulty, it is safe to try more intense activity such as jogging, treadmill, bicycling, low-impact aerobics, swimming, etc. °b. Save the most intensive and strenuous activity for last such as sit-ups, heavy lifting, contact sports, etc  Refrain from any heavy lifting or straining until you   are off narcotics for pain control.   °c. DO NOT PUSH THROUGH PAIN.  Let pain be your guide: If it hurts to do something, don't do it.  Pain is your body warning you to avoid that activity for another week until the pain goes down. °d. You may drive when you are no longer taking prescription pain medication, you can comfortably wear a seatbelt, and you can safely maneuver your car and  apply brakes. °e. You may have sexual intercourse when it is comfortable.  °8. FOLLOW UP in our office °a. Please call [facility] at [phone] to set up an appointment to see your surgeon in the office for a follow-up appointment approximately 2-3 weeks after your surgery. °b. Make sure that you call for this appointment the day you arrive home to insure a convenient appointment time. °9. IF YOU HAVE DISABILITY OR FAMILY LEAVE FORMS, BRING THEM TO THE OFFICE FOR PROCESSING.  DO NOT GIVE THEM TO YOUR DOCTOR. ° ° °WHEN TO CALL US [phone]: °1. Poor pain control °2. Reactions / problems with new medications (rash/itching, nausea, etc)  °3. Fever over 101.5 F (38.5 C) °4. Worsening swelling or bruising °5. Continued bleeding from incision. °6. Increased pain, redness, or drainage from the incision °7. Difficulty breathing / swallowing ° ° The clinic staff is available to answer your questions during regular business hours (8:30am-5pm).  Please don’t hesitate to call and ask to speak to one of our nurses for clinical concerns.  ° If you have a medical emergency, go to the nearest emergency room or call 911. ° A surgeon from [facility] is always on call at the hospitals ° ° °[facility] °[address], [address], [place], [place]  [address] ? °MAIN: [phone] ? TOLL FREE: 1-800-359-8415 ?  °FAX [phone] °[email or web address] ° °Post Anesthesia Home Care Instructions ° °Activity: °Get plenty of rest for the remainder of the day. A responsible adult should stay with you for 24 hours following the procedure.  °For the next 24 hours, DO NOT: °-Drive a car °-Operate machinery °-Drink alcoholic beverages °-Take any medication unless instructed by your physician °-Make any legal decisions or sign important papers. ° °Meals: °Start with liquid foods such as gelatin or soup. Progress to regular foods as tolerated. Avoid greasy, spicy, heavy foods. If nausea and/or  vomiting occur, drink only clear liquids until the nausea and/or vomiting subsides. Call your physician if vomiting continues. ° °Special Instructions/Symptoms: °Your throat may feel dry or sore from the anesthesia or the breathing tube placed in your throat during surgery. If this causes discomfort, gargle with warm salt water. The discomfort should disappear within 24 hours. ° °If you had a scopolamine patch placed behind your ear for the management of post- operative nausea and/or vomiting: ° °1. The medication in the patch is effective for 72 hours, after which it should be removed.  Wrap patch in a tissue and discard in the trash. Wash hands thoroughly with soap and water. °2. You may remove the patch earlier than 72 hours if you experience unpleasant side effects which may include dry mouth, dizziness or visual disturbances. °3. Avoid touching the patch. Wash your hands with soap and water after contact with the patch. °  ° °

## 2015-07-13 NOTE — Transfer of Care (Signed)
Immediate Anesthesia Transfer of Care Note  Patient: Monique Watson  Procedure(s) Performed: Procedure(s): REMOVAL PORT-A-CATH (N/A)  Patient Location: PACU  Anesthesia Type:MAC  Level of Consciousness: awake and patient cooperative  Airway & Oxygen Therapy: Patient Spontanous Breathing and Patient connected to face mask oxygen  Post-op Assessment: Report given to RN and Post -op Vital signs reviewed and stable  Post vital signs: Reviewed and stable  Last Vitals:  Filed Vitals:   07/13/15 0959  BP: 155/75  Pulse: 80  Temp: 36.7 C  Resp: 18    Complications: No apparent anesthesia complications

## 2015-07-13 NOTE — Interval H&P Note (Signed)
History and Physical Interval Note:  07/13/2015 11:12 AM  Monique Watson  has presented today for surgery, with the diagnosis of Port In Place  The various methods of treatment have been discussed with the patient and family. After consideration of risks, benefits and other options for treatment, the patient has consented to  Procedure(s): REMOVAL PORT-A-CATH (N/A) as a surgical intervention .  The patient's history has been reviewed, patient examined, no change in status, stable for surgery.  I have reviewed the patient's chart and labs.  Questions were answered to the patient's satisfaction.     [name]

## 2015-07-13 NOTE — Anesthesia Postprocedure Evaluation (Signed)
Anesthesia Post Note  Patient: Monique Watson  Procedure(s) Performed: Procedure(s) (LRB): REMOVAL PORT-A-CATH (N/A)  Anesthesia type: MAC  Patient location: PACU  Post pain: Pain level controlled  Post assessment: Patient's Cardiovascular Status Stable  Last Vitals:  Filed Vitals:   07/13/15 1236  BP: 169/69  Pulse: 70  Temp: 36.7 C  Resp: 16    Post vital signs: Reviewed and stable  Level of consciousness: sedated  Complications: No apparent anesthesia complications

## 2015-07-13 NOTE — Op Note (Signed)
Preop diagnosis: Indwelling port a catheter for chemotherapy  Postop diagnosis: Same  Procedure: Removal of port a catheter  Surgeon: Erroll Luna M.D.  Anesthesia: MAC with local  EBL: Minimal  Specimen none  Drains: None  Indications for procedure: The patient presents for removal of port a catheter after completing chemotherapy. The patient no longer requires central venous access. Risks of bleeding, infection, catheter fragmentation, embolization, arrhythmias and damage to arteries, veins and nerves and possibly other mediastinal structures discussed. The patient agrees to proceed.  Description of procedure: The patient was seen in the holding area. Questions were answered. The patient agreed to proceed. The patient was taken to the operating room. The patient was placed supine. MAC and local anesthesia used. . The skin on the upper chest was prepped and draped in a sterile fashion. Timeout was done. The patient received preoperative antibiotics. Incision was made through the old port site right chest  and the hub of the Port-A-Cath was seen. The sutures were cut to release the port from the chest wall. The catheter was removed in its entirety without difficulty. The tract was closed with 3-0 Vicryl. 4 Monocryl was used to close the skin. All final counts were correct. The patient was taken to recovery in satisfactory condition.

## 2015-07-13 NOTE — Anesthesia Preprocedure Evaluation (Signed)
Anesthesia Evaluation  Patient identified by MRN, date of birth, ID band Patient awake    Reviewed: Allergy & Precautions, NPO status , Patient's Chart, lab work & pertinent test results  Airway Mallampati: I  TM Distance: >3 FB Neck ROM: Full    Dental   Pulmonary former smoker,    Pulmonary exam normal        Cardiovascular hypertension, Pt. on medications Normal cardiovascular exam     Neuro/Psych    GI/Hepatic   Endo/Other    Renal/GU      Musculoskeletal   Abdominal   Peds  Hematology   Anesthesia Other Findings   Reproductive/Obstetrics                             Anesthesia Physical Anesthesia Plan  ASA: II  Anesthesia Plan: MAC   Post-op Pain Management:    Induction: Intravenous  Airway Management Planned: Simple Face Mask  Additional Equipment:   Intra-op Plan:   Post-operative Plan:   Informed Consent: I have reviewed the patients History and Physical, chart, labs and discussed the procedure including the risks, benefits and alternatives for the proposed anesthesia with the patient or authorized representative who has indicated his/her understanding and acceptance.     Plan Discussed with: CRNA and Surgeon  Anesthesia Plan Comments:         Anesthesia Quick Evaluation  

## 2015-07-13 NOTE — H&P (Signed)
H&P   KATERYN MARASIGAN (MR# [id number])      H&P Info    Author Note Status Last Update User Last Update Date/Time   Erroll Luna, MD Signed Erroll Luna, MD 05/31/2015 2:42 PM    H&P    Expand All Collapse All   Margarette Asal. Furniss 05/31/2015 2:14 PM Location: [facility] Patient #: [id number] DOB: 1941/07/19 Married / Language: English / Race: Black or African American Female History of Present Illness Marcello Moores A. Tj Kitchings MD; 05/31/2015 2:42 PM) Patient words: reck  PT RETURNS TO DISCUSS PORT REMOVAL SINCE SHE WILL NO LONGER NEED IT. IT HAS BEEN IN PLACE SINCE 2006 AND IS NO LONGER NEEDED. SHE HAD AN ALLERGIC REACTION TO CHLOHEXADINE IN THE PAST. FEELS WELL SINCE HER LUMPECTOMY.  The patient is a 74 year old female   Allergies Marjean Donna, CMA; 05/31/2015 2:14 PM) Arimidex *ANTINEOPLASTICS AND ADJUNCTIVE THERAPIES* Diovan *ANTIHYPERTENSIVES* Chlorhexidine *PHARMACEUTICAL ADJUVANTS* Anaphylaxis.  Medication History Marjean Donna, CMA; 05/31/2015 2:14 PM) Tamoxifen Citrate (20MG  Tablet, Oral) Active. Albuterol Sulfate (108 (90 Base)MCG/ACT Aero Pow Br Act, Inhalation) Active. Calcium Carbonate (600MG  Tablet, Oral) Active. Vitamin D3 (1000UNIT Tablet, Oral) Active. Colace (100MG  Capsule, Oral) Active. Ferrous Sulfate CR (160 (50 Fe)MG Tablet ER, Oral) Active. Omega 3 (1000MG  Capsule, Oral) Active. Potassium (99MG  Tablet, Oral) Active. Vitamin E (400UNIT Capsule, Oral) Active. Medications Reconciled    Vitals (Sonya Bynum CMA; 05/31/2015 2:14 PM) 05/31/2015 2:14 PM Weight: 190 lb Height: 66in Body Surface Area: 2 m Body Mass Index: 30.67 kg/m Temp.: 41F(Temporal)  Pulse: 72 (Regular)  BP: 128/76 (Sitting, Left Arm, Standard)     Physical Exam (Crew Goren A. Kolden Dupee MD; 05/31/2015 2:37 PM)  General Mental Status-Alert. General Appearance-Consistent with stated age. Hydration-Well hydrated. Voice-Normal.  Chest and Lung  Exam Note: PORT NOTED RIGT UPPER CHEST   Neurologic Neurologic evaluation reveals -alert and oriented x 3 with no impairment of recent or remote memory. Mental Status-Normal.  Musculoskeletal Normal Exam - Left-Upper Extremity Strength Normal and Lower Extremity Strength Normal. Normal Exam - Right-Upper Extremity Strength Normal, Lower Extremity Weakness.    Assessment & Plan (Estephany Perot A. Meryle Pugmire MD; 05/31/2015 2:42 PM)  POST-OPERATIVE STATE (Z98.89) Impression: DOING WELL WITH RADIATIO THERAPY  PORT CATHETER IN PLACE (Y63.785) Impression: NO LONGER NEEDED AND REMOVAL REQUESTED. no chlohexadine for prep. can use ETOH and MAC anesthesia. It's been in place for 10 years so higher risk of fragmentation and the need for other procedures. The risk of port removal include bleeding, infection, organ injury, nerve injury , blood clots, worsening of underlying condition, chronic pain open surgery, death, and the need for other operattions. Pt agrees to proceed  Current Plans Pt Education - [facility] Free Text Education/Instructions: discussed with patient and provided information.

## 2015-07-14 ENCOUNTER — Encounter (HOSPITAL_BASED_OUTPATIENT_CLINIC_OR_DEPARTMENT_OTHER): Payer: Self-pay | Admitting: Surgery

## 2015-07-18 ENCOUNTER — Other Ambulatory Visit: Payer: Self-pay | Admitting: Oncology

## 2015-07-18 DIAGNOSIS — C50311 Malignant neoplasm of lower-inner quadrant of right female breast: Secondary | ICD-10-CM

## 2015-07-18 DIAGNOSIS — D696 Thrombocytopenia, unspecified: Secondary | ICD-10-CM

## 2015-07-22 ENCOUNTER — Other Ambulatory Visit: Payer: Self-pay | Admitting: Oncology

## 2015-07-22 ENCOUNTER — Ambulatory Visit: Payer: Medicare Other | Admitting: Oncology

## 2015-07-22 ENCOUNTER — Other Ambulatory Visit: Payer: Medicare Other

## 2015-08-20 DIAGNOSIS — R0602 Shortness of breath: Secondary | ICD-10-CM | POA: Diagnosis not present

## 2015-08-20 DIAGNOSIS — J189 Pneumonia, unspecified organism: Secondary | ICD-10-CM | POA: Diagnosis not present

## 2015-08-20 DIAGNOSIS — R062 Wheezing: Secondary | ICD-10-CM | POA: Diagnosis not present

## 2015-08-31 ENCOUNTER — Encounter: Payer: Medicare Other | Admitting: Nurse Practitioner

## 2015-09-02 ENCOUNTER — Encounter: Payer: Self-pay | Admitting: Nurse Practitioner

## 2015-09-02 DIAGNOSIS — C50912 Malignant neoplasm of unspecified site of left female breast: Secondary | ICD-10-CM

## 2015-09-02 DIAGNOSIS — D0511 Intraductal carcinoma in situ of right breast: Secondary | ICD-10-CM

## 2015-09-02 NOTE — Progress Notes (Signed)
The Survivorship Care Plan was mailed to [name] as she was unable to come in to the Survivorship Clinic for an in-person visit at this time. A letter was mailed to her outlining the purpose of the content of the care plan, as well as encouraging her to reach out to me with any questions or concerns.  My business card was included in the correspondence to the patient as well.  A copy of the care plan was also routed/faxed/mailed to Gerrit Heck, MD, the patient's PCP.  I will not be placing any follow-up appointments to the Survivorship Clinic for Monique Watson, but I am happy to see her at any time in the future for any survivorship concerns that may arise. Thank you for allowing me to participate in her care!  Kenn File, [place] [phone]

## 2015-09-02 NOTE — Progress Notes (Signed)
CORRECTION TO PREVIOUS DOCUMENTATION: RIGHT, not LEFT breast:  The Survivorship Care Plan was mailed to Ms. Touchet as she was unable to come in to the Survivorship Clinic for an in-person visit at this time. A letter was mailed to her outlining the purpose of the content of the care plan, as well as encouraging her to reach out to me with any questions or concerns.  My business card was included in the correspondence to the patient as well.  A copy of the care plan was also routed/faxed/mailed to Gerrit Heck, MD, the patient's PCP.  I will not be placing any follow-up appointments to the Survivorship Clinic for Ms. Brigandi, but I am happy to see her at any time in the future for any survivorship concerns that may arise. Thank you for allowing me to participate in her care!  Kenn File, [place] [phone]

## 2015-10-17 ENCOUNTER — Other Ambulatory Visit: Payer: Self-pay | Admitting: Oncology

## 2015-10-17 DIAGNOSIS — Z1501 Genetic susceptibility to malignant neoplasm of breast: Secondary | ICD-10-CM

## 2015-10-17 DIAGNOSIS — Z1509 Genetic susceptibility to other malignant neoplasm: Secondary | ICD-10-CM

## 2015-10-17 DIAGNOSIS — C569 Malignant neoplasm of unspecified ovary: Secondary | ICD-10-CM

## 2015-10-17 DIAGNOSIS — C50311 Malignant neoplasm of lower-inner quadrant of right female breast: Secondary | ICD-10-CM

## 2015-10-18 ENCOUNTER — Encounter: Payer: Self-pay | Admitting: Oncology

## 2015-10-18 ENCOUNTER — Ambulatory Visit (HOSPITAL_BASED_OUTPATIENT_CLINIC_OR_DEPARTMENT_OTHER): Payer: Medicare Other | Admitting: Oncology

## 2015-10-18 ENCOUNTER — Other Ambulatory Visit (HOSPITAL_BASED_OUTPATIENT_CLINIC_OR_DEPARTMENT_OTHER): Payer: Medicare Other

## 2015-10-18 ENCOUNTER — Telehealth: Payer: Self-pay | Admitting: Oncology

## 2015-10-18 VITALS — BP 158/83 | HR 86 | Temp 98.3°F | Resp 18 | Wt 191.3 lb

## 2015-10-18 DIAGNOSIS — C569 Malignant neoplasm of unspecified ovary: Secondary | ICD-10-CM

## 2015-10-18 DIAGNOSIS — Z853 Personal history of malignant neoplasm of breast: Secondary | ICD-10-CM

## 2015-10-18 DIAGNOSIS — D696 Thrombocytopenia, unspecified: Secondary | ICD-10-CM

## 2015-10-18 DIAGNOSIS — Z1501 Genetic susceptibility to malignant neoplasm of breast: Secondary | ICD-10-CM

## 2015-10-18 DIAGNOSIS — Z1509 Genetic susceptibility to other malignant neoplasm: Secondary | ICD-10-CM

## 2015-10-18 DIAGNOSIS — Z79810 Long term (current) use of selective estrogen receptor modulators (SERMs): Secondary | ICD-10-CM | POA: Diagnosis not present

## 2015-10-18 DIAGNOSIS — C50311 Malignant neoplasm of lower-inner quadrant of right female breast: Secondary | ICD-10-CM

## 2015-10-18 DIAGNOSIS — D0511 Intraductal carcinoma in situ of right breast: Secondary | ICD-10-CM

## 2015-10-18 DIAGNOSIS — I890 Lymphedema, not elsewhere classified: Secondary | ICD-10-CM

## 2015-10-18 DIAGNOSIS — Z8543 Personal history of malignant neoplasm of ovary: Secondary | ICD-10-CM | POA: Diagnosis not present

## 2015-10-18 DIAGNOSIS — Z1589 Genetic susceptibility to other disease: Secondary | ICD-10-CM

## 2015-10-18 LAB — CBC WITH DIFFERENTIAL/PLATELET
BASO%: 0.6 % (ref 0.0–2.0)
Basophils Absolute: 0 10*3/uL (ref 0.0–0.1)
EOS%: 1.6 % (ref 0.0–7.0)
Eosinophils Absolute: 0.1 10*3/uL (ref 0.0–0.5)
HCT: 38.3 % (ref 34.8–46.6)
HGB: 12.8 g/dL (ref 11.6–15.9)
LYMPH%: 26.2 % (ref 14.0–49.7)
MCH: 32.4 pg (ref 25.1–34.0)
MCHC: 33.5 g/dL (ref 31.5–36.0)
MCV: 96.6 fL (ref 79.5–101.0)
MONO#: 0.3 10*3/uL (ref 0.1–0.9)
MONO%: 7.6 % (ref 0.0–14.0)
NEUT#: 2.5 10*3/uL (ref 1.5–6.5)
NEUT%: 64 % (ref 38.4–76.8)
Platelets: 110 10*3/uL — ABNORMAL LOW (ref 145–400)
RBC: 3.96 10*6/uL (ref 3.70–5.45)
RDW: 13.4 % (ref 11.2–14.5)
WBC: 3.9 10*3/uL (ref 3.9–10.3)
lymph#: 1 10*3/uL (ref 0.9–3.3)

## 2015-10-18 LAB — COMPREHENSIVE METABOLIC PANEL
ALT: 13 U/L (ref 0–55)
AST: 18 U/L (ref 5–34)
Albumin: 3.1 g/dL — ABNORMAL LOW (ref 3.5–5.0)
Alkaline Phosphatase: 48 U/L (ref 40–150)
Anion Gap: 9 mEq/L (ref 3–11)
BUN: 12.5 mg/dL (ref 7.0–26.0)
CO2: 26 mEq/L (ref 22–29)
Calcium: 8.7 mg/dL (ref 8.4–10.4)
Chloride: 108 mEq/L (ref 98–109)
Creatinine: 0.9 mg/dL (ref 0.6–1.1)
EGFR: 69 mL/min/{1.73_m2} — ABNORMAL LOW (ref >90)
Glucose: 106 mg/dl (ref 70–140)
Potassium: 3.4 mEq/L — ABNORMAL LOW (ref 3.5–5.1)
Sodium: 142 mEq/L (ref 136–145)
Total Bilirubin: 0.55 mg/dL (ref 0.20–1.20)
Total Protein: 7.3 g/dL (ref 6.4–8.3)

## 2015-10-18 NOTE — Progress Notes (Signed)
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  She continues tamoxifen, which she was on for previous breast cancer at time of DCIS diagnosis, as she was previously intolerant to aromatase inhibitors. Last bilateral tomo mammograms were at [facility] 12-16-14, unremarkable, then DCIS identified on breast MRI 01-27-15 (obtained due to the BRCA 2 mutation that was documented 08-2014). She is due back to Dr Brantley Stage; she has prn follow up with Dr Lisbeth Renshaw.  She missed yearly appointment with Dr Josephina Shih fall 2016, will reschedule. He has not seen her since documentation of BRCA 2 mutation. Last CT AP was 05-2013.  Patient had anaphylactic reaction to chlorhexidine 05-2015,, when used to prep PAC for access. PAC was removed by Dr Brantley Stage 06-2015 to avoid risk of accidental exposure to chlorhexidine with that line.  She has long history of tobacco abuse, now [facility]. Last CXR 10-09-14.  She has had mild thrombocytopenia x years.    Patient complains of dizziness/ feeling of near syncope when she bends neck backwards as when looking up at high cabinets and when she leans over as to pick something up from floor, this ongoing for 3-4 months unchanged. She also has what seems different slight dizziness at times when she turns over in bed. She denies HA or other neurologic symptoms.  She has noticed slight heaviness of right breast, with some new fullness and slight erythema of lower right breast, seems gradually progressive in last couple of months. At time of Dr [facility] exam in 06-2015, she had hyperpigmentation still noted from RT. She has had no recent fever or symptoms of infection. Remaining nonsmoking is not easy. She denies increased SOB or  cough. She denies new or different pain, and no abdominal or pelvic pain. She denies bleeding or easy bruising. No LE swelling. Bowels moving regularly, no change in bladder. She has healed well from [facility] removal. She is trying to lose weight by cutting back on portion sizes, has not increased exercise. Remainder of 10 point Review of Systems negative.  PAC out Flu vaccine 06-09-15 BRCA 2 + 08-2014   ONCOLOGIC HISTORY RIGHT BREAST DCIS Patient has been on adjuvant tamoxifen for left breast cancer since Aug 2011, previously intolerant to Femara and Aromasin with severe arthralgias. Bilateral 3D mammograms were at [facility] 12-16-14 had no mamographic findings of concern. With the BRCA abnormality, she had breast MRI on 01-27-15, with 5 x 8 x 9 mm area of concern in right central breast. She had US biopsy at [facility] on 02-09-15 with benign path ([id number]). She then had MRI core biopsy on 02-24-15 which identified intermediate grade DCIS with comedo necrosis, ER + 95% and PR 0. The pathology information was reported to radiologist, and patient referred to general surgery. Mastectomy was discussed due to her BRCA mutation, however she preferred lumpectomy. She had seed localized partial mastectomy by Dr Brantley Stage on 04-07-15, with pathology showing 1.1 cm intermediate grade DCIS with necrosis, closest margin < 1 mm ([id number]). Patient reportedly declined reexcision of the close margin. She had radiation 3D conformal + boost for total 50 Gy 05-17-15 thru 06-14-15.  LEFT BREAST cancer was diagnosed in March 2010, multifocal T1N0 at lumpectomy and sentinel node evaluation, ER/PR positive and HER 2 negative. She had local radiation, was intolerant  to Femara and Aromasin due to severe arthralgias, and has been on tamoxifen since Aug 2011.  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 Surgery was TAH/BSO/omentectomy and radical complete debulking including anterior rectal resection with anastomosis. She had 6 cycles of adjuvant taxol/ carboplatin from 04-18-2005 thru 08-14-2005. She is followed yearly by gyn oncology She was found to have BRCA 2 mutation by testing 08-2014    Objective:  Vital signs in last 24 hours:  BP 158/83 mmHg  Pulse 86  Temp(Src) 98.3 F (36.8 C) (Oral)  Resp 18  Wt 191 lb 4.8 oz (86.773 kg)  SpO2 99% Weight down 4.5 lbs, intentional. Alert, oriented and appropriate, looks comfortable, respirations not labored room air, very pleasant as always. Ambulatory without difficulty. Voice a little raspy, unchanged.   HEENT:PERRL, sclerae not icteric. Oral mucosa moist without lesions, posterior pharynx clear.  No JVD.  Lymphatics:no cervical,supraclavicular, axillary or inguinal adenopathy Resp: diminished BS thruout otherwise clear to auscultation bilaterally and no dullness to percussion bilaterally Cardio: regular rate and rhythm. No gallop. GI: soft, nontender, not distended, no mass or organomegaly. Normally active bowel sounds. Surgical incision not remarkable. Musculoskeletal/ Extremities: without pitting edema, cords, tenderness Neuro: no peripheral neuropathy. Otherwise nonfocal. PSYCH appropriate mood and affect Skin without rash, ecchymosis, petechiae Breasts: Right with well healed lumpectomy scar. From just above nipple inferiorly there is mild lymphedema, with minimal diffuse erythema, not tender. Left has well healed lumpectomy scar and no obvious skin or nipple findings, no lymphedema. Blaterally without dominant mass. Axillae benign. Scar from previous portacath healed and no remarkable  Lab Results:  Results for orders placed or performed in visit on 10/18/15  CBC with Differential  Result Value Ref Range   WBC 3.9 3.9 - 10.3 10e3/uL   NEUT# 2.5 1.5 - 6.5 10e3/uL   HGB 12.8 11.6 - 15.9 g/dL   HCT 38.3 34.8 -  46.6 %   Platelets 110 (L) 145 - 400 10e3/uL   MCV 96.6 79.5 - 101.0 fL   MCH 32.4 25.1 - 34.0 pg   MCHC 33.5 31.5 - 36.0 g/dL   RBC 3.96 3.70 - 5.45 10e6/uL   RDW 13.4 11.2 - 14.5 %   lymph# 1.0 0.9 - 3.3 10e3/uL   MONO# 0.3 0.1 - 0.9 10e3/uL   Eosinophils Absolute 0.1 0.0 - 0.5 10e3/uL   Basophils Absolute 0.0 0.0 - 0.1 10e3/uL   NEUT% 64.0 38.4 - 76.8 %   LYMPH% 26.2 14.0 - 49.7 %   MONO% 7.6 0.0 - 14.0 %   EOS% 1.6 0.0 - 7.0 %   BASO% 0.6 0.0 - 2.0 %  Comprehensive metabolic panel  Result Value Ref Range   Sodium 142 136 - 145 mEq/L   Potassium 3.4 (L) 3.5 - 5.1 mEq/L   Chloride 108 98 - 109 mEq/L   CO2 26 22 - 29 mEq/L   Glucose 106 70 - 140 mg/dl   BUN 12.5 7.0 - 26.0 mg/dL   Creatinine 0.9 0.6 - 1.1 mg/dL   Total Bilirubin 0.55 0.20 - 1.20 mg/dL   Alkaline Phosphatase 48 40 - 150 U/L   AST 18 5 - 34 U/L   ALT 13 0 - 55 U/L   Total Protein 7.3 6.4 - 8.3 g/dL   Albumin 3.1 (L) 3.5 - 5.0 g/dL   Calcium 8.7 8.4 - 10.4  mg/dL   Anion Gap 9 3 - 11 mEq/L   EGFR 69 (L) >90 ml/min/1.73 m2  CA 125  Result Value Ref Range   Cancer Antigen (CA) 125 26.7 0.0 - 38.1 U/mL  CA 125 (Parallel Testing)  Result Value Ref Range   CA 125 12 <35 U/mL     Studies/Results:  No results found. Mammograms ordered at [facility] ~ 12-17-15 and MRI to be done 2-3 months aftrer mammograms.  Medications: I have reviewed the patient's current medications. Continue tamoxifen. Add baby ASA daily  Discussion: all of interval history reviewed. Continue close follow up with physicians and with imaging for breast Ca, and physicians + lab for gyn ca. She will call Dr Drema Dallas re the positional dizziness, will start low dose ASA and continue to avoid smoking. I will see her back in 4 months, or sooner if needed.  Assessment/ Plan 1.Right DCIS/ past left breast ca/ BRCA 2:  Continue tamoxifen, mammograms and MRI as above. Patient to schedule follow up with Dr Brantley Stage.  2.clinical lymphedema right breast:  referral to lymphedema PT 3.IIIC papillary serous ovarian ca diagnosed 03-2005. With BRCA 2 abnormality now documented, will ask gyn oncology to reschedule visit 4.anaphylaxis to chlorhexidine wash 5.BRCA 2 mutation 6.near syncope with certain head positions. Likely needs evaluation of carotids. Have asked her to begin 81 mg ASA daily and to discuss with PCP 7.long tobacco recently DCd. Encouraged her to remain nonsmoking 8.mild thrombocytopenia x years, stable, no bleeding 9. PAC out 10.flu vaccine 05-2015  All questions answered. Appointments requested with Dr Josephina Shih and Dr Brantley Stage. Lymphedema PT referral made. Mammograms [facility] late March then breast MRI ~ late May. Patient to call for appointment with PCP.  Time spent 30 min including >50% counseling and coordination of care. Cc Drs Brantley Stage, Laqueta Linden, MD   10/20/2015, 2:30 PM

## 2015-10-18 NOTE — Telephone Encounter (Signed)
Appointments set for Feb 6 @ 320 pm with Dr. Erroll Luna, Feb 10 @ 130 pm with Dr Aldean Ast, Mammogram @ [facility] on April 4 @ 930 am. Patient aware.

## 2015-10-19 LAB — CA 125: Cancer Antigen (CA) 125: 26.7 U/mL (ref 0.0–38.1)

## 2015-10-19 LAB — CANCER ANTIGEN 125 (PARALLEL TESTING): CA 125: 12 U/mL (ref <35)

## 2015-10-20 ENCOUNTER — Ambulatory Visit: Payer: Medicare Other | Attending: Oncology | Admitting: Physical Therapy

## 2015-10-20 ENCOUNTER — Telehealth: Payer: Self-pay

## 2015-10-20 DIAGNOSIS — I890 Lymphedema, not elsewhere classified: Secondary | ICD-10-CM | POA: Diagnosis not present

## 2015-10-20 DIAGNOSIS — Z853 Personal history of malignant neoplasm of breast: Secondary | ICD-10-CM | POA: Insufficient documentation

## 2015-10-20 NOTE — Telephone Encounter (Signed)
-----   Message from Gordy Levan, MD sent at 10/20/2015  9:19 AM EST ----- Labs seen and need follow up: please let her know CA 125 marker stable in good range. potassium a little low, suggest increase in diet   thanks

## 2015-10-20 NOTE — Telephone Encounter (Signed)
Spoke with husband Juanda Crumble regarding the results of the lab work as noted below by Dr. [name].  He will give the message to Ms. Dibbern.  He will make sure there are straw beriees , OJ , oranges and bananas for her to eat to get in the extra potassium.  She can continue with the OTC KCL tab =99 mg

## 2015-10-20 NOTE — Therapy (Signed)
[place], [place], [address] Phone: [phone]   Fax:  [phone]  Physical Therapy Evaluation  Patient Details  Name: Monique Watson MRN: [id number] Date of Birth: Feb 08, 1941 Referring Provider: Dr. [name]   Encounter Date: 10/20/2015      PT End of Session - 10/20/15 1220    Visit Number 1   Number of Visits 9   Date for PT Re-Evaluation 11/17/15   PT Start Time 5038   PT Stop Time 1100   PT Time Calculation (min) 45 min   Activity Tolerance Patient tolerated treatment well   Behavior During Therapy [facility] for tasks assessed/performed      Past Medical History  Diagnosis Date  . Breast cancer ([place]) 09/29/2011  . Ovarian cancer ([place]) 09/29/2011  . Arthritis   . Pneumonia   . Hypertension   . Anemia   . Radiation 01/14/2009-02/10/2009    left breast 45 Gy, boosted to 5 Gy    Past Surgical History  Procedure Laterality Date  . Abdominal hysterectomy    . Breast lumpectomy    . Tonsillectomy    . Appendectomy    . Bladder surgery    . Breast lumpectomy with radioactive seed localization Right 04/07/2015    Procedure: BREAST LUMPECTOMY WITH RADIOACTIVE SEED LOCALIZATION;  Surgeon: Erroll Luna, MD;  Location: [place];  Service: General;  Laterality: Right;  . Port-a-cath removal N/A 07/13/2015    Procedure: REMOVAL PORT-A-CATH;  Surgeon: Erroll Luna, MD;  Location: [place];  Service: General;  Laterality: N/A;    There were no vitals filed for this visit.  Visit Diagnosis:  Lymphedema of breast - Plan: PT plan of care cert/re-cert      Subjective Assessment - 10/20/15 1031    Subjective Pt reports she has swelling in her right breast    Pertinent History right breast cancer with lumpectomy  August 2016 with radiation. with left breast cancer with lumpectomy with 2 nodes removed in 2010 with radiation and no difficulty . Pt had chemotheapy in 2006  due ovarian cancer    Patient Stated Goals to get help with right breast lymphedema    Currently in Pain? No/denies            [facility] PT Assessment - 10/20/15 0001    Assessment   Medical Diagnosis breast cancer   Referring Provider Dr. [name]    Hand Dominance Right   Precautions   Precautions Other (comment)   Precaution Comments previous cancer with radiation    Restrictions   Weight Bearing Restrictions No   Balance Screen   Has the patient fallen in the past 6 months No   Has the patient had a decrease in activity level because of a fear of falling?  Yes  pt describes vertigo, will not address this session    Is the patient reluctant to leave their home because of a fear of falling?  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  Left breast has indented scar at lateral edge with full tissue above it from previous surgery    Other:   Other/ Comments Lymphedema Life Impact Score 17 or 25% impairment    Posture/Postural Control   Posture/Postural Control Postural limitations   Postural Limitations Increased thoracic kyphosis   AROM   Right Shoulder Flexion 145 Degrees   Right Shoulder ABduction 134 Degrees  painful    Right Shoulder External Rotation 75 Degrees   Left Shoulder Flexion 125 Degrees   Left Shoulder ABduction 85 Degrees   Left Shoulder External Rotation 75 Degrees   Strength   Right Shoulder Flexion 3+/5   Right  Shoulder ABduction 3+/5   Left Shoulder Flexion 3-/5  pain    Left Shoulder ABduction 3-/5  pain   Palpation   Palpation comment mild fibrosis in right breast except for around nipple and at inferior portion of breast where it feels a little fuller            LYMPHEDEMA/ONCOLOGY QUESTIONNAIRE - 10/20/15 1044    Right Upper Extremity Lymphedema   10 cm Proximal to Olecranon Process 35 cm   Olecranon Process 28.9 cm   15 cm Proximal to Ulnar Styloid Process 29 cm   Just Proximal to Ulnar Styloid Process 17.5 cm   Across Hand at [employer] 20.9 cm   At [place] of 2nd Digit 7.2 cm   Left Upper Extremity Lymphedema   10 cm Proximal to Olecranon Process 35 cm   Olecranon Process 29 cm   15 cm Proximal to Ulnar Styloid Process 27 cm   Just Proximal to Ulnar Styloid Process 17.5 cm   Across Hand at [employer] 20.5 cm   At [place] of 2nd Digit 7 cm                [facility] Adult PT Treatment/Exercise - 10/20/15 0001    Self-Care   Self-Care Other Self-Care Comments   Other Self-Care Comments  small dotted peach foam patch given to patient to wear inside bra at lateral breast                         [facility] Goals - 10/20/15 1227    CC Long Term Goal  #1   Title Pt will report a 25 % reduction in symptoms of breast lymphedema   Time 4   Period Weeks   Status New   CC Long Term Goal  #2   Title Pt will be independent in management of lymphedema at home    Time 4   Period Weeks   Status New   CC Long Term Goal  #3   Title pt will have reductionin Lymphedema Life Impact scale to 12 indicating an improvement in her daily life    Time 4   Period Weeks   Status New            Plan - 10/20/15 1221    Clinical Impression Statement 74 you femal s/p right breast lumpectomy and radiation for treatment of cancer who presents with breast lymphedema.  she has only mild fibrosis in right breast. but it is larger than left. She will benefit from  complete decongestive therapy with adjuct use of kinesiotape    Pt will benefit from skilled therapeutic intervention in order to improve on the following deficits Increased edema;Decreased strength  Rehab Potential Excellent   Clinical Impairments Affecting Rehab Potential previous radiation    PT Frequency 2x / week   PT Duration 4 weeks   PT Treatment/Interventions ADLs/Self Care Home Management;Manual lymph drainage;Compression bandaging;Scar mobilization;Patient/family education;Therapeutic exercise;Taping;DME Instruction   PT Next Visit Plan manual lymph drainage, try kinesiotape, assess use of peach foam. consider sending information to [facility] to assess benefits for compression bra and pad    Consulted and Agree with Plan of Care Patient          G-Codes - 10/29/2015 1204    Functional Assessment Tool Used lymphedema life impact scale  score 17 or 25% impairment    Functional Limitation Self care   Self Care Current Status (I5483) At least 20 percent but less than 40 percent impaired, limited or restricted   Self Care Goal Status (W3468) At least 1 percent but less than 20 percent impaired, limited or restricted       Problem List Patient Active Problem List   Diagnosis Date Noted  . Portacath in place 05/28/2015  . Anaphylaxis 05/28/2015  . 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 Breast cancer of lower-inner quadrant of right female breast ([place]) 04/20/2015  . Environmental and seasonal allergies 01/02/2015  . Hx of colonic polyps 01/02/2015  . Thrombocytopenia ([place]) 01/02/2015  . Past use of tobacco 01/02/2015  . Port catheter in place 01/02/2015  . 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 Pneumonia 10/08/2014  . Essential hypertension 10/08/2014  . CAP (community acquired pneumonia)   . Genetic testing 10/07/2014  . 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  . DOE (dyspnea on exertion) 07/20/2014  . SOB (shortness of breath) 10/29/2011  . Pneumonitis due to fumes and  vapors ([place]) 10/29/2011  . Acute bronchitis 10/29/2011  . Tobacco use disorder 10/29/2011  . Hypertension 10/29/2011  . Ovarian cancer ([place]) 09/29/2011   Donato Heinz. Owens Shark, PT  Oct 29, 2015, 12:46 PM  [place], [place], [address] Phone: [phone]   Fax:  [phone]  Name: Monique Watson MRN: [id number] Date of Birth: 25-Jul-1941

## 2015-10-25 DIAGNOSIS — Z1501 Genetic susceptibility to malignant neoplasm of breast: Secondary | ICD-10-CM | POA: Diagnosis not present

## 2015-10-25 DIAGNOSIS — Z1502 Genetic susceptibility to malignant neoplasm of ovary: Secondary | ICD-10-CM | POA: Diagnosis not present

## 2015-10-25 DIAGNOSIS — D0511 Intraductal carcinoma in situ of right breast: Secondary | ICD-10-CM | POA: Diagnosis not present

## 2015-10-25 DIAGNOSIS — Z853 Personal history of malignant neoplasm of breast: Secondary | ICD-10-CM | POA: Diagnosis not present

## 2015-10-26 ENCOUNTER — Ambulatory Visit: Payer: Medicare Other

## 2015-10-26 DIAGNOSIS — I890 Lymphedema, not elsewhere classified: Secondary | ICD-10-CM

## 2015-10-26 NOTE — Therapy (Signed)
[place], [place], [address] Phone: [phone]   Fax:  [phone]  Physical Therapy Treatment  Patient Details  Name: Monique Watson MRN: [id number] Date of Birth: Jun 06, 1941 Referring Provider: Dr. [name]   Encounter Date: 10/26/2015      PT End of Session - 10/26/15 1107    Visit Number 2   Number of Visits 9   Date for PT Re-Evaluation 11/17/15   PT Start Time 1245   PT Stop Time 1104   PT Time Calculation (min) 41 min   Activity Tolerance Patient tolerated treatment well   Behavior During Therapy [facility] for tasks assessed/performed      Past Medical History  Diagnosis Date  . Breast cancer ([place]) 09/29/2011  . Ovarian cancer ([place]) 09/29/2011  . Arthritis   . Pneumonia   . Hypertension   . Anemia   . Radiation 01/14/2009-02/10/2009    left breast 45 Gy, boosted to 5 Gy    Past Surgical History  Procedure Laterality Date  . Abdominal hysterectomy    . Breast lumpectomy    . Tonsillectomy    . Appendectomy    . Bladder surgery    . Breast lumpectomy with radioactive seed localization Right 04/07/2015    Procedure: BREAST LUMPECTOMY WITH RADIOACTIVE SEED LOCALIZATION;  Surgeon: Erroll Luna, MD;  Location: [place];  Service: General;  Laterality: Right;  . Port-a-cath removal N/A 07/13/2015    Procedure: REMOVAL PORT-A-CATH;  Surgeon: Erroll Luna, MD;  Location: [place];  Service: General;  Laterality: N/A;    There were no vitals filed for this visit.  Visit Diagnosis:  Lymphedema of breast      Subjective Assessment - 10/26/15 1027    Subjective I haven't noticed a difference with the foam but I have been wearing it.    Pertinent History right breast cancer with lumpectomy  August 2016 with radiation. with left breast cancer with lumpectomy with 2 nodes removed in 2010 with radiation and no difficulty . Pt had chemotheapy in 2006 due ovarian  cancer    Patient Stated Goals to get help with right breast lymphedema                          [facility] Adult PT Treatment/Exercise - 10/26/15 0001    Manual Therapy   Manual Lymphatic Drainage (MLD) In Supine: Short neck, superficial and deep abdominals, Rt inguinal nodes and Rt axillo-inguinal anastomosis (avoided Lt axilla due to pt reporting 2 lymph nodes removed 10 yrs ago), and Rt breast redirecting towards anastomosis.                         [facility] Goals - 10/20/15 1227    CC Long Term Goal  #1   Title Pt will report a 25 % reduction in symptoms of breast lymphedema   Time 4   Period Weeks   Status New   CC Long Term Goal  #2   Title Pt will be independent in management of lymphedema at home    Time 4   Period Weeks   Status New   CC Long Term Goal  #3   Title pt will have reductionin Lymphedema Life Impact scale to 12 indicating an improvement in her daily life    Time 4   Period Weeks   Status New  Plan - 10/26/15 1108    Clinical Impression Statement Pt did well with initial treatment and instruction of manual lymph drainage today. She demonstrated a good initial understanding of principles of sequence.    Pt will benefit from skilled therapeutic intervention in order to improve on the following deficits Increased edema;Decreased strength   Rehab Potential Excellent   Clinical Impairments Affecting Rehab Potential previous radiation    PT Frequency 2x / week   PT Duration 4 weeks   PT Treatment/Interventions ADLs/Self Care Home Management;Manual lymph drainage;Compression bandaging;Scar mobilization;Patient/family education;Therapeutic exercise;Taping;DME Instruction   PT Next Visit Plan Assess next visit. Cont manual lymph drainage and instructpt in this and issue handout, try kinesiotape. Consider sending information to [facility] to assess benefits for compression bra and pad    Consulted and Agree with Plan of  Care Patient        Problem List Patient Active Problem List   Diagnosis Date Noted  . 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 History of left breast cancer 10/20/2015  . Portacath in place 05/28/2015  . Anaphylaxis 05/28/2015  . 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 Breast cancer of lower-inner quadrant of right female breast ([place]) 04/20/2015  . Environmental and seasonal allergies 01/02/2015  . Hx of colonic polyps 01/02/2015  . Thrombocytopenia ([place]) 01/02/2015  . Past use of tobacco 01/02/2015  . Port catheter in place 01/02/2015  . 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 Pneumonia 10/08/2014  . Essential hypertension 10/08/2014  . CAP (community acquired pneumonia)   . Genetic testing 10/07/2014  . 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  . DOE (dyspnea on exertion) 07/20/2014  . SOB (shortness of breath) 10/29/2011  . Pneumonitis due to fumes and vapors ([place]) 10/29/2011  . Acute bronchitis 10/29/2011  . Tobacco use disorder 10/29/2011  . Hypertension 10/29/2011  . Ovarian cancer ([place]) 09/29/2011    Otelia Limes, PTA 10/26/2015, 12:22 PM  [place], [place], [address] Phone: [phone]   Fax:  [phone]  Name: Monique Watson MRN: [id number] Date of Birth: [birth year]

## 2015-10-28 ENCOUNTER — Ambulatory Visit: Payer: Medicare Other

## 2015-10-28 DIAGNOSIS — I890 Lymphedema, not elsewhere classified: Secondary | ICD-10-CM | POA: Diagnosis not present

## 2015-10-28 NOTE — Therapy (Signed)
[place], [place], [address] Phone: [phone]   Fax:  [phone]  Physical Therapy Treatment  Patient Details  Name: Monique Watson MRN: [id number] Date of Birth: Jan 26, 1941 Referring Provider: Dr. [name]   Encounter Date: 10/28/2015      PT End of Session - 10/28/15 1030    Visit Number 3   Number of Visits 9   Date for PT Re-Evaluation 11/17/15   PT Start Time 0852   PT Stop Time 0936   PT Time Calculation (min) 44 min   Activity Tolerance Patient tolerated treatment well   Behavior During Therapy [facility] for tasks assessed/performed      Past Medical History  Diagnosis Date  . Breast cancer ([place]) 09/29/2011  . Ovarian cancer ([place]) 09/29/2011  . Arthritis   . Pneumonia   . Hypertension   . Anemia   . Radiation 01/14/2009-02/10/2009    left breast 45 Gy, boosted to 5 Gy    Past Surgical History  Procedure Laterality Date  . Abdominal hysterectomy    . Breast lumpectomy    . Tonsillectomy    . Appendectomy    . Bladder surgery    . Breast lumpectomy with radioactive seed localization Right 04/07/2015    Procedure: BREAST LUMPECTOMY WITH RADIOACTIVE SEED LOCALIZATION;  Surgeon: Erroll Luna, MD;  Location: [place];  Service: General;  Laterality: Right;  . Port-a-cath removal N/A 07/13/2015    Procedure: REMOVAL PORT-A-CATH;  Surgeon: Erroll Luna, MD;  Location: [place];  Service: General;  Laterality: N/A;    There were no vitals filed for this visit.  Visit Diagnosis:  Lymphedema of breast      Subjective Assessment - 10/28/15 0853    Subjective Wasn't sure if my mind was playing tricks on me or not but I thought I noticed my Rt breast feeling a little better since last session. Still wearing the foam too.    Pertinent History right breast cancer with lumpectomy  August 2016 with radiation. with left breast cancer with lumpectomy with 2 nodes  removed in 2010 with radiation and no difficulty . Pt had chemotheapy in 2006 due ovarian cancer    Patient Stated Goals to get help with right breast lymphedema    Currently in Pain? No/denies                         [facility] Adult PT Treatment/Exercise - 10/28/15 0001    Manual Therapy   Manual Lymphatic Drainage (MLD) In Supine: Short neck, superficial and deep abdominals, Rt inguinal nodes and Rt axillo-inguinal anastomosis (avoided Lt axilla due to pt reporting 2 lymph nodes removed 10 yrs ago), and Rt breast redirecting towards anastomosis instructing pt throughout today.   Kinesiotix   Edema 3 fingered fan shaped placed along Rt axillo-inguinal anastomosis to lateral breast                PT Education - 10/28/15 0937    Education provided Yes   Education Details Self manual lymph drainage; proper removal of kinesiotape   Person(s) Educated Patient   Methods Explanation;Demonstration;Tactile cues;Handout;Verbal cues   Comprehension Verbalized understanding;Need further instruction                [facility] Goals - 10/28/15 1035    CC Long Term Goal  #1   Title Pt will report a 25 % reduction in symptoms of breast lymphedema  Status On-going   CC Long Term Goal  #2   Title Pt will be independent in management of lymphedema at home   Initial instruction this week done of manual ymph drainage.   Status On-going   CC Long Term Goal  #3   Title pt will have reductionin Lymphedema Life Impact scale to 12 indicating an improvement in her daily life    Status On-going            Plan - 10/28/15 1031    Clinical Impression Statement Pt did well with review of manual lymph drainage and seemed to have a good understanding of correct technique so issued handout. She is considering having her husband come to learn as well for support and to help for the S/L portion. She would like ot go through [facility] so will fax pts demographics to them  today to have them look into compression bra and/or pad.    Pt will benefit from skilled therapeutic intervention in order to improve on the following deficits Increased edema;Decreased strength   Rehab Potential Excellent   Clinical Impairments Affecting Rehab Potential previous radiation    PT Frequency 2x / week   PT Duration 4 weeks   PT Treatment/Interventions ADLs/Self Care Home Management;Manual lymph drainage;Compression bandaging;Scar mobilization;Patient/family education;Therapeutic exercise;Taping;DME Instruction   PT Next Visit Plan Assess next visit. Cont manual lymph drainage and review with pt/have her perform, assess tolerance of kinesiotape.    Recommended Other Services Demographics sent to [facility] for compression bra and/or pad.   Consulted and Agree with Plan of Care Patient        Problem List Patient Active Problem List   Diagnosis Date Noted  . 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 History of left breast cancer 10/20/2015  . Portacath in place 05/28/2015  . Anaphylaxis 05/28/2015  . 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 Breast cancer of lower-inner quadrant of right female breast ([place]) 04/20/2015  . Environmental and seasonal allergies 01/02/2015  . Hx of colonic polyps 01/02/2015  . Thrombocytopenia ([place]) 01/02/2015  . Past use of tobacco 01/02/2015  . Port catheter in place 01/02/2015  . 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 Pneumonia 10/08/2014  . Essential hypertension 10/08/2014  . CAP (community acquired pneumonia)   . Genetic testing 10/07/2014  . 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  . DOE (dyspnea on exertion) 07/20/2014  . SOB (shortness of breath) 10/29/2011  . Pneumonitis due to fumes and vapors ([place]) 10/29/2011  . Acute bronchitis 10/29/2011  . Tobacco use disorder 10/29/2011  . Hypertension 10/29/2011  . Ovarian cancer ([place]) 09/29/2011    Otelia Limes, PTA 10/28/2015, 11:15 AM  [place], [place], [address] Phone: [phone]   Fax:  [phone]  Name: Monique Watson MRN: [id number] Date of Birth: [birth year]

## 2015-10-28 NOTE — Patient Instructions (Addendum)
Cancer Rehab [phone] Self manual lymph drainage: Perform this sequence once a day.  Only give enough pressure no your skin to make the skin move.  Diaphragmatic - Supine   Inhale through nose making navel move out toward hands. Exhale through puckered lips, hands follow navel in. Repeat _5__ times. Rest _10__ seconds between repeats.   Copyright  VHI. All rights reserved.  Hug yourself.  Do circles at your neck just above your collarbones.  Repeat this 10 times.    Copyright  VHI. All rights reserved.  LEG: Inguinal Nodes Stimulation   With small finger side of hand against hip crease on involved side, gently perform circles at the crease. Repeat __10_ times.   Copyright  VHI. All rights reserved.  1) Axilla to Inguinal Nodes - Sweep   On involved side, sweep _4__ times from armpit along side of trunk to hip crease.        Direct fluid to treat all of lower outer breast tissue downward and outward toward      pathway that is aimed at the right groin.  Finish by doing the pathways as described above going from your involved armpit to the same side groin and going across your upper chest from the involved shoulder to the uninvolved shoulder.  Repeat the steps above where you do circles in your left groin and right armpit. Copyright  VHI. All rights reserved.

## 2015-10-29 ENCOUNTER — Encounter: Payer: Self-pay | Admitting: Gynecology

## 2015-10-29 ENCOUNTER — Ambulatory Visit: Payer: Medicare Other | Attending: Gynecology | Admitting: Gynecology

## 2015-10-29 VITALS — BP 160/77 | HR 77 | Temp 97.5°F | Resp 18 | Ht 66.0 in | Wt 189.8 lb

## 2015-10-29 DIAGNOSIS — Z7982 Long term (current) use of aspirin: Secondary | ICD-10-CM | POA: Diagnosis not present

## 2015-10-29 DIAGNOSIS — I10 Essential (primary) hypertension: Secondary | ICD-10-CM | POA: Insufficient documentation

## 2015-10-29 DIAGNOSIS — Z7289 Other problems related to lifestyle: Secondary | ICD-10-CM | POA: Diagnosis not present

## 2015-10-29 DIAGNOSIS — Z803 Family history of malignant neoplasm of breast: Secondary | ICD-10-CM | POA: Diagnosis not present

## 2015-10-29 DIAGNOSIS — R32 Unspecified urinary incontinence: Secondary | ICD-10-CM

## 2015-10-29 DIAGNOSIS — Z800 Family history of malignant neoplasm of digestive organs: Secondary | ICD-10-CM | POA: Diagnosis not present

## 2015-10-29 DIAGNOSIS — Z148 Genetic carrier of other disease: Secondary | ICD-10-CM | POA: Insufficient documentation

## 2015-10-29 DIAGNOSIS — D649 Anemia, unspecified: Secondary | ICD-10-CM | POA: Insufficient documentation

## 2015-10-29 DIAGNOSIS — Z923 Personal history of irradiation: Secondary | ICD-10-CM | POA: Insufficient documentation

## 2015-10-29 DIAGNOSIS — Z87891 Personal history of nicotine dependence: Secondary | ICD-10-CM | POA: Diagnosis not present

## 2015-10-29 DIAGNOSIS — C569 Malignant neoplasm of unspecified ovary: Secondary | ICD-10-CM

## 2015-10-29 DIAGNOSIS — Z1502 Genetic susceptibility to malignant neoplasm of ovary: Secondary | ICD-10-CM

## 2015-10-29 DIAGNOSIS — Z8543 Personal history of malignant neoplasm of ovary: Secondary | ICD-10-CM | POA: Diagnosis not present

## 2015-10-29 DIAGNOSIS — Z853 Personal history of malignant neoplasm of breast: Secondary | ICD-10-CM | POA: Diagnosis not present

## 2015-10-29 NOTE — Patient Instructions (Signed)
Plan to follow up with Dr. Fermin Schwab in one year or sooner.  Call in the Fall of 2017 to schedule your appointment with him.  We can refer you to Dr. Maryland Pink with [facility].  Her office number is [phone].

## 2015-10-29 NOTE — Progress Notes (Signed)
Consult Note: Gyn-Onc   Monique Watson 75 y.o. female  No chief complaint on file.    Assessment: Stage IIIc ovarian cancer 2006. BRCA mutation carrier. Clinically free of disease.  Past history of bilateral breast cancer.  Plan the patient return to see me in one year. She's encouraged to continue having annual mammograms. She's encouraged to discuss BRCA2 mutation with her daughter and encouragement daughter to be tested.  We will refer the patient to Dr. Maryland Pink for evaluation of her urinary incontinence.  Interval History: The patient returns today as previously scheduled for followup. Since her last visit she's done well . She specifically denies any GI or GU symptoms. She has no pelvic pain pressure vaginal bleeding or discharge. Functional status is excellent. Since her last visit she developed breast cancer in her right breast treated with excision and radiation therapy. Patient tinged take tamoxifen.  CA-125 earlier this week was 12 units per mL (stable and normal)  Patient also complains of urinary incontinence. She has a past history of having a "bladder tack" but that was many years ago.  HPI: Patient underwent debulking for a stage IIIC ovarian cancer in June of 2006. She was optimally debulked with complete resection of all visible tumor (R0). She then received 6 cycles of carboplatin and Taxol chemotherapy. preoperatively her CA 125 was 2665 units per mL and dropped promptly during the chemotherapy. She's been followed since that time with no evidence recurrent disease. Subsequent to her ovarian cancer diagnosis she is found to be BRCA2 positive and is subsequently had bilateral breast cancers.  Review of Systems:10 point review of systems is negative as noted above.    Vitals: There were no vitals taken for this visit.  Physical Exam: General : The patient is a healthy woman in no acute distress.  HEENT: normocephalic, extraoccular movements normal; neck  is supple without thyromegally  Lynphnodes: Supraclavicular and inguinal nodes not enlarged  Abdomen: Soft, non-tender, no ascites, no organomegally, no masses, no hernias  Pelvic:  EGBUS: Normal female  Vagina: Normal, no lesions  Urethra and Bladder: Normal, non-tender  Cervix: Surgically absent  Uterus: Surgically absent  Bi-manual examination: Non-tender; no adenxal masses or nodularity  Rectal: normal sphincter tone, no masses, no blood  Lower extremities: No edema or varicosities. Normal range of motion      Allergies  Allergen Reactions  . Chlorhexidine Anaphylaxis, Hives, Shortness Of Breath and Itching    anaphylaxis  . Arimidex [Anastrozole] Other (See Comments)    Body aches per patient. Irven Baltimore, RN.  Marland Kitchen Diovan [Valsartan] Other (See Comments)    arthralgias  . Uncoded Nonscreenable Allergen     Blood pressure medication patient stated that she has a low tolerance for    Past Medical History  Diagnosis Date  . Breast cancer ([place]) 09/29/2011  . Ovarian cancer ([place]) 09/29/2011  . Arthritis   . Pneumonia   . Hypertension   . Anemia   . Radiation 01/14/2009-02/10/2009    left breast 45 Gy, boosted to 5 Gy    Past Surgical History  Procedure Laterality Date  . Abdominal hysterectomy    . Breast lumpectomy    . Tonsillectomy    . Appendectomy    . Bladder surgery    . Breast lumpectomy with radioactive seed localization Right 04/07/2015    Procedure: BREAST LUMPECTOMY WITH RADIOACTIVE SEED LOCALIZATION;  Surgeon: Erroll Luna, MD;  Location: [place];  Service: General;  Laterality: Right;  . Port-a-cath  removal N/A 07/13/2015    Procedure: REMOVAL PORT-A-CATH;  Surgeon: Erroll Luna, MD;  Location: [place];  Service: General;  Laterality: N/A;    Current Outpatient Prescriptions  Medication Sig Dispense Refill  . aspirin 81 MG tablet Take 81 mg by mouth daily.    . Cholecalciferol (VITAMIN D3) 2000 UNITS TABS Take 1  capsule by mouth daily.     Marland Kitchen docusate sodium (COLACE) 100 MG capsule Take 100 mg by mouth daily.    . ferrous fumarate (HEMOCYTE - 106 MG FE) 325 (106 FE) MG TABS Take 1 tablet by mouth every 7 (seven) days. No certain day    . non-metallic deodorant (ALRA) MISC Apply 1 application topically daily.    . Omega-3 Fatty Acids (FISH OIL CONCENTRATE) 1000 MG CAPS Take 1,000 mg by mouth daily.     . Potassium 99 MG TABS Take 2 tablets by mouth daily.     Marland Kitchen PROAIR HFA 108 (90 Base) MCG/ACT inhaler Inhale 2 puffs into the lungs every 6 (six) hours as needed.  0  . tamoxifen (NOLVADEX) 20 MG tablet Take 1 tablet (20 mg total) by mouth daily. 90 tablet 1  . vitamin E (VITAMIN E) 400 UNIT capsule Take 400 Units by mouth 2 (two) times daily.     No current facility-administered medications for this visit.    Social History   Social History  . Marital Status: Widowed    Spouse Name: N/A  . Number of Children: 2  . Years of Education: N/A   Occupational History  . Not on file.   Social History Main Topics  . Smoking status: Former Smoker -- 0.00 packs/day for 58 years  . Smokeless tobacco: Never Used     Comment: not smoking while using nicotine patch 03-26-12  . Alcohol Use: 0.6 oz/week    1 Shots of liquor per week  . Drug Use: No  . Sexual Activity: Yes    Birth Control/ Protection: Post-menopausal   Other Topics Concern  . Not on file   Social History Narrative    Family History  Problem Relation Age of Onset  . Ovarian cancer Mother 71  . Colon cancer Brother 55  . Breast cancer Paternal Aunt     6-7 with breast cancer; one bilateral, several premenopausal  . Pancreatitis Brother 104  . Colon cancer Paternal Aunt   . Cancer Cousin     NOS      CLARKE-PEARSON,Malvern Kadlec L, MD 10/29/2015, 1:41 PM

## 2015-11-01 ENCOUNTER — Ambulatory Visit: Payer: Medicare Other | Admitting: Physical Therapy

## 2015-11-01 DIAGNOSIS — R42 Dizziness and giddiness: Secondary | ICD-10-CM | POA: Diagnosis not present

## 2015-11-01 DIAGNOSIS — I890 Lymphedema, not elsewhere classified: Secondary | ICD-10-CM

## 2015-11-01 DIAGNOSIS — Z853 Personal history of malignant neoplasm of breast: Secondary | ICD-10-CM | POA: Diagnosis not present

## 2015-11-01 DIAGNOSIS — R0989 Other specified symptoms and signs involving the circulatory and respiratory systems: Secondary | ICD-10-CM | POA: Diagnosis not present

## 2015-11-01 DIAGNOSIS — Z8543 Personal history of malignant neoplasm of ovary: Secondary | ICD-10-CM | POA: Diagnosis not present

## 2015-11-01 NOTE — Therapy (Signed)
[place], [place], [address] Phone: [phone]   Fax:  [phone]  Physical Therapy Treatment  Patient Details  Name: Monique Watson MRN: [id number] Date of Birth: 1941/05/18 Referring Provider: Dr. [name]   Encounter Date: 11/01/2015      PT End of Session - 11/01/15 1723    Visit Number 4   Number of Visits 9   Date for PT Re-Evaluation 11/17/15   PT Start Time 7371   PT Stop Time 1430   PT Time Calculation (min) 42 min   Activity Tolerance Patient tolerated treatment well   Behavior During Therapy [facility] for tasks assessed/performed      Past Medical History  Diagnosis Date  . Breast cancer ([place]) 09/29/2011  . Ovarian cancer ([place]) 09/29/2011  . Arthritis   . Pneumonia   . Hypertension   . Anemia   . Radiation 01/14/2009-02/10/2009    left breast 45 Gy, boosted to 5 Gy    Past Surgical History  Procedure Laterality Date  . Abdominal hysterectomy    . Breast lumpectomy    . Tonsillectomy    . Appendectomy    . Bladder surgery    . Breast lumpectomy with radioactive seed localization Right 04/07/2015    Procedure: BREAST LUMPECTOMY WITH RADIOACTIVE SEED LOCALIZATION;  Surgeon: Erroll Luna, MD;  Location: [place];  Service: General;  Laterality: Right;  . Port-a-cath removal N/A 07/13/2015    Procedure: REMOVAL PORT-A-CATH;  Surgeon: Erroll Luna, MD;  Location: [place];  Service: General;  Laterality: N/A;    There were no vitals filed for this visit.  Visit Diagnosis:  Lymphedema of breast      Subjective Assessment - 11/01/15 1350    Subjective Nothing new today; "I don't GGYI--RS'W still a question mark there" about whether the tape and massage are helping.   Currently in Pain? No/denies                         [facility] Adult PT Treatment/Exercise - 11/01/15 0001    Manual Therapy   Manual Lymphatic Drainage (MLD) In  supine:  first, directed patient in performing self-manual lymph drainage, then after she had done it, therapist performed for her:  short neck, superficial and deep abdomen, right inguinal nodes and axillo-inguinal anastomosis, and right breast, directing toward pathways; therapist also did some in left sidelying.   Kinesiotix   Edema Checked kinesiotape, which is still in place and without any loosening at the ends, so it was left there.                        [facility] Goals - 11/01/15 1725    CC Long Term Goal  #1   Title Pt will report a 25 % reduction in symptoms of breast lymphedema   Status On-going   CC Long Term Goal  #2   Title Pt will be independent in management of lymphedema at home    Status On-going            Plan - 11/01/15 1723    Clinical Impression Statement Again did well with review of self-manual lymph drainage and performing it herself with cueing.  She is to try it at home on her own.  So far she is equivocal about whether therapy is helping.   Pt will benefit from skilled therapeutic intervention in order to improve  on the following deficits Increased edema;Decreased strength   Rehab Potential Excellent   Clinical Impairments Affecting Rehab Potential previous radiation    PT Frequency 2x / week   PT Duration 4 weeks   PT Treatment/Interventions ADLs/Self Care Home Management;Patient/family education;Manual lymph drainage   PT Next Visit Plan Assess.  Assess and replace kinesiotape.   Consulted and Agree with Plan of Care Patient        Problem List Patient Active Problem List   Diagnosis Date Noted  . 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 History of left breast cancer 10/20/2015  . Portacath in place 05/28/2015  . Anaphylaxis 05/28/2015  . 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 Breast cancer of lower-inner quadrant of right female breast ([place]) 04/20/2015  . Environmental and seasonal allergies 01/02/2015  .  Hx of colonic polyps 01/02/2015  . Thrombocytopenia ([place]) 01/02/2015  . Past use of tobacco 01/02/2015  . Port catheter in place 01/02/2015  . 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 Pneumonia 10/08/2014  . Essential hypertension 10/08/2014  . CAP (community acquired pneumonia)   . Genetic testing 10/07/2014  . 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  . DOE (dyspnea on exertion) 07/20/2014  . SOB (shortness of breath) 10/29/2011  . Pneumonitis due to fumes and vapors ([place]) 10/29/2011  . Acute bronchitis 10/29/2011  . Tobacco use disorder 10/29/2011  . Hypertension 10/29/2011  . Ovarian cancer ([place]) 09/29/2011    Adia Crammer 11/01/2015, 5:26 PM  [place], [place], [address] Phone: [phone]   Fax:  [phone]  Name: LASHICA HANNAY MRN: [id number] Date of Birth: 1940-10-30    Serafina Royals, PT 11/01/2015 5:26 PM

## 2015-11-02 ENCOUNTER — Other Ambulatory Visit: Payer: Self-pay | Admitting: Family Medicine

## 2015-11-02 DIAGNOSIS — R0989 Other specified symptoms and signs involving the circulatory and respiratory systems: Secondary | ICD-10-CM

## 2015-11-02 DIAGNOSIS — R42 Dizziness and giddiness: Secondary | ICD-10-CM

## 2015-11-04 ENCOUNTER — Ambulatory Visit: Payer: Medicare Other

## 2015-11-04 DIAGNOSIS — I890 Lymphedema, not elsewhere classified: Secondary | ICD-10-CM | POA: Diagnosis not present

## 2015-11-04 NOTE — Therapy (Signed)
[place], [place], [address] Phone: [phone]   Fax:  [phone]  Physical Therapy Treatment  Patient Details  Name: Monique Watson MRN: [id number] Date of Birth: 02-Jan-1941 Referring Provider: Dr. [name]   Encounter Date: 11/04/2015      PT End of Session - 11/04/15 0921    Visit Number 5   Number of Visits 9   Date for PT Re-Evaluation 11/17/15   PT Start Time 9379  Pt arrived late   PT Stop Time 0933   PT Time Calculation (min) 36 min   Activity Tolerance Patient tolerated treatment well   Behavior During Therapy [facility] for tasks assessed/performed      Past Medical History  Diagnosis Date  . Breast cancer ([place]) 09/29/2011  . Ovarian cancer ([place]) 09/29/2011  . Arthritis   . Pneumonia   . Hypertension   . Anemia   . Radiation 01/14/2009-02/10/2009    left breast 45 Gy, boosted to 5 Gy    Past Surgical History  Procedure Laterality Date  . Abdominal hysterectomy    . Breast lumpectomy    . Tonsillectomy    . Appendectomy    . Bladder surgery    . Breast lumpectomy with radioactive seed localization Right 04/07/2015    Procedure: BREAST LUMPECTOMY WITH RADIOACTIVE SEED LOCALIZATION;  Surgeon: Erroll Luna, MD;  Location: [place];  Service: General;  Laterality: Right;  . Port-a-cath removal N/A 07/13/2015    Procedure: REMOVAL PORT-A-CATH;  Surgeon: Erroll Luna, MD;  Location: [place];  Service: General;  Laterality: N/A;    There were no vitals filed for this visit.  Visit Diagnosis:  Lymphedema of breast                       [facility] Adult PT Treatment/Exercise - 11/04/15 0001    Manual Therapy   Manual Lymphatic Drainage (MLD) In supine: therapist performed for her while reviewing:  short neck, superficial and deep abdomen (instructing pt in this today), right inguinal nodes and axillo-inguinal anastomosis, and right  breast, directing toward pathways; therapist also did some in left sidelying.                PT Education - 11/04/15 1059    Education provided Yes   Education Details Instructed pt in superficial and deep abdominals andissued handout   Person(s) Educated Patient   Methods Explanation;Demonstration;Handout   Comprehension Verbalized understanding;Returned demonstration                [facility] Goals - 11/04/15 [phone]    CC Long Term Goal  #1   Title Pt will report a 25 % reduction in symptoms of breast lymphedema  Pt reports 80% improvement of symptoms.   Status Achieved   CC Long Term Goal  #2   Title Pt will be independent in management of lymphedema at home   Pt is performing self manual lymph drainage daily, wearing compression foam and has a sports bra she will begin wearing that as well.    Status Achieved            Plan - 11/04/15 1100    Clinical Impression Statement Pt continues to do well with therapy and being compliant with HEP. "After discussing her progress and goals pt and therapist determined she is ready for discharge as she is very pleased with her progress this far and will continue being compliant.  Also emailed Serafina Royals, PT and she is agreeable to DC  All goals met.    Pt will benefit from skilled therapeutic intervention in order to improve on the following deficits Increased edema;Decreased strength   Rehab Potential Excellent   Clinical Impairments Affecting Rehab Potential previous radiation    PT Frequency 2x / week   PT Duration 4 weeks   PT Treatment/Interventions ADLs/Self Care Home Management;Patient/family education;Manual lymph drainage   PT Next Visit Plan Discharge visit.    PT Home Exercise Plan Pt to wear her sports bra, continue wearing compression foam and cont self manual lymph drainage daily.    Consulted and Agree with Plan of Care Patient          G-Codes - Nov 08, 2015 1413    Functional Assessment Tool  Used clinical judgement   Functional Limitation Self care   Self Care Goal Status [phone]) At least 1 percent but less than 20 percent impaired, limited or restricted   Self Care Discharge Status [phone]) At least 1 percent but less than 20 percent impaired, limited or restricted      Problem List Patient Active Problem List   Diagnosis Date Noted  . 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 History of left breast cancer 10/20/2015  . Portacath in place 05/28/2015  . Anaphylaxis 05/28/2015  . 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 Breast cancer of lower-inner quadrant of right female breast ([place]) 04/20/2015  . Environmental and seasonal allergies 01/02/2015  . Hx of colonic polyps 01/02/2015  . Thrombocytopenia ([place]) 01/02/2015  . Past use of tobacco 01/02/2015  . Port catheter in place 01/02/2015  . 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 Pneumonia 10/08/2014  . Essential hypertension 10/08/2014  . CAP (community acquired pneumonia)   . Genetic testing 10/07/2014  . 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  . DOE (dyspnea on exertion) 07/20/2014  . SOB (shortness of breath) 10/29/2011  . Pneumonitis due to fumes and vapors ([place]) 10/29/2011  . Acute bronchitis 10/29/2011  . Tobacco use disorder 10/29/2011  . Hypertension 10/29/2011  . Ovarian cancer ([place]) 09/29/2011    SALISBURY,DONNA, PTA Nov 08, 2015, 2:16 PM  [place], [place], [address] Phone: [phone]   Fax:  [phone]  Name: Monique Watson MRN: [id number] Date of Birth: March 13, 1941    PHYSICAL THERAPY DISCHARGE SUMMARY  Visits from Start of Care: 5  Current functional level related to goals / functional outcomes: Goals met as noted above.   Remaining deficits: Swelling will continue to require management.   Education / Equipment: Self-care for breast swelling. Plan: Patient agrees to discharge.  Patient goals were met. Patient is  being discharged due to meeting the stated rehab goals.  ?????    Serafina Royals, PT November 08, 2015 2:16 PM

## 2015-11-09 ENCOUNTER — Ambulatory Visit
Admission: RE | Admit: 2015-11-09 | Discharge: 2015-11-09 | Disposition: A | Discharge status: Home / Self Care | Payer: Medicare Other | Source: Ambulatory Visit | Attending: Family Medicine | Admitting: Family Medicine

## 2015-11-09 ENCOUNTER — Ambulatory Visit: Payer: Medicare Other

## 2015-11-09 DIAGNOSIS — R42 Dizziness and giddiness: Secondary | ICD-10-CM | POA: Diagnosis not present

## 2015-11-09 MED ORDER — GADOBENATE DIMEGLUMINE 529 MG/ML IV SOLN
17.0000 mL | Freq: Once | INTRAVENOUS | Status: AC | PRN
  Administered 2015-11-09: 17 mL via INTRAVENOUS

## 2015-11-11 ENCOUNTER — Encounter: Payer: Medicare Other | Admitting: Physical Therapy

## 2015-11-17 ENCOUNTER — Encounter: Payer: Medicare Other | Admitting: Physical Therapy

## 2015-11-23 ENCOUNTER — Ambulatory Visit
Admission: RE | Admit: 2015-11-23 | Discharge: 2015-11-23 | Disposition: A | Discharge status: Home / Self Care | Payer: Medicare Other | Source: Ambulatory Visit | Attending: Family Medicine | Admitting: Family Medicine

## 2015-11-23 DIAGNOSIS — R0989 Other specified symptoms and signs involving the circulatory and respiratory systems: Secondary | ICD-10-CM | POA: Diagnosis not present

## 2015-11-29 ENCOUNTER — Encounter: Payer: Self-pay | Admitting: *Deleted

## 2015-11-29 ENCOUNTER — Ambulatory Visit (INDEPENDENT_AMBULATORY_CARE_PROVIDER_SITE_OTHER): Payer: Medicare Other | Admitting: Diagnostic Neuroimaging

## 2015-11-29 VITALS — Ht 66.0 in

## 2015-11-29 DIAGNOSIS — R269 Unspecified abnormalities of gait and mobility: Secondary | ICD-10-CM

## 2015-11-29 DIAGNOSIS — R42 Dizziness and giddiness: Secondary | ICD-10-CM | POA: Diagnosis not present

## 2015-11-29 NOTE — Patient Instructions (Signed)
Thank you for coming to see [place] at [facility]. I hope we have been able to provide you high quality care today.  You may receive a patient satisfaction survey over the next few weeks. We would appreciate your feedback and comments so that we may continue to improve ourselves and the health of our patients.  - refer to vestibular / balance physical therapy   ~~~~~~~~~~~~~~~~~~~~~~~~~~~~~~~~~~~~~~~~~~~~~~~~~~~~~~~~~~~~~~~~~  DR. Andreah Goheen'S GUIDE TO HAPPY AND HEALTHY LIVING These are some of my general health and wellness recommendations. Some of them may apply to you better than others. Please use common sense as you try these suggestions and feel free to ask me any questions.   ACTIVITY/FITNESS Mental, social, emotional and physical stimulation are very important for brain and body health. Try learning a new activity (arts, music, language, sports, games).  Keep moving your body to the best of your abilities. You can do this at home, inside or outside, the park, community center, gym or anywhere you like. Consider a physical therapist or personal trainer to get started. Consider the app Sworkit. Fitness trackers such as smart-watches, smart-phones or Fitbits can help as well.   NUTRITION Eat more plants: colorful vegetables, nuts, seeds and berries.  Eat less sugar, salt, preservatives and processed foods.  Avoid toxins such as cigarettes and alcohol.  Drink water when you are thirsty. Warm water with a slice of lemon is an excellent morning drink to start the day.  Consider these websites for more information The Nutrition Source (https://www.henry-hernandez.biz/) Precision Nutrition (WindowBlog.ch)   RELAXATION Consider practicing mindfulness meditation or other relaxation techniques such as deep breathing, prayer, yoga, tai chi, massage. See website mindful.org or the apps Headspace or Calm to help get  started.   SLEEP Try to get at least 7-8+ hours sleep per day. Regular exercise and reduced caffeine will help you sleep better. Practice good sleep hygeine techniques. See website sleep.org for more information.   PLANNING Prepare estate planning, living will, healthcare POA documents. Sometimes this is best planned with the help of an attorney. Theconversationproject.org and agingwithdignity.org are excellent resources.

## 2015-11-29 NOTE — Progress Notes (Signed)
[facility]  PATIENT: Monique Watson DOB: 1941-01-30  REFERRING CLINICIAN: Edwin Dada HISTORY FROM: patient  REASON FOR VISIT: new consult    HISTORICAL  CHIEF COMPLAINT:  Chief Complaint  Patient presents with  . Dizziness    rm 7, New pt, "dizziness since 05/2015, compltd xrt to R breast 05/2015"    HISTORY OF PRESENT ILLNESS:   75 year old right-handed female here for evaluation of dizziness. Patient has history of ovarian cancer 2006 status post surgery and chemotherapy. She is status post left breast cancer in 2010 and right-sided breast cancer 2016. Following treatment of right-sided breast cancer 2016 in September, patient noticed increasing problems with balance and dizziness. Moving quickly, getting up, bending over, turning quickly lead to her feeling "dizzy" which she describes as a sensation of lack of coordination and balance. Sometimes she feels like she is falling to the left side. No severe vertigo, spinning, lightheadedness, nausea or vomiting. Patient had evaluation by PCP, oncology, also had MRI and carotid ultrasound testing which were unremarkable. Patient referred to me for further evaluation.  No other specific triggering her migraine factors. No recent illnesses, traumas, stress or change in medication. Patient initially thought her symptoms were related to her cancer treatments.   REVIEW OF SYSTEMS: Full 14 system review of systems performed and negative with exception of: Fatigue hearing loss cough wheezing blurred vision feeling hot dizziness allergies decreased energy.  ALLERGIES: Allergies  Allergen Reactions  . Chlorhexidine Anaphylaxis, Hives, Shortness Of Breath and Itching    anaphylaxis  . Arimidex [Anastrozole] Other (See Comments)    Body aches per patient. Irven Baltimore, RN.  Marland Kitchen Diovan [Valsartan] Other (See Comments)    arthralgias  . Uncoded Nonscreenable Allergen     Blood pressure medication patient stated that she has a  low tolerance for    HOME MEDICATIONS: Outpatient Prescriptions Prior to Visit  Medication Sig Dispense Refill  . aspirin 81 MG tablet Take 81 mg by mouth daily.    . Cholecalciferol (VITAMIN D3) 2000 UNITS TABS Take 1 capsule by mouth daily.     Marland Kitchen docusate sodium (COLACE) 100 MG capsule Take 100 mg by mouth daily.    . ferrous fumarate (HEMOCYTE - 106 MG FE) 325 (106 FE) MG TABS Take 1 tablet by mouth every 7 (seven) days. No certain day    . Omega-3 Fatty Acids (FISH OIL CONCENTRATE) 1000 MG CAPS Take 1,000 mg by mouth daily.     . Potassium 99 MG TABS Take 2 tablets by mouth daily.     Marland Kitchen PROAIR HFA 108 (90 Base) MCG/ACT inhaler Inhale 2 puffs into the lungs every 6 (six) hours as needed.  0  . tamoxifen (NOLVADEX) 20 MG tablet Take 1 tablet (20 mg total) by mouth daily. 90 tablet 1  . vitamin E (VITAMIN E) 400 UNIT capsule Take 400 Units by mouth 2 (two) times daily.    . non-metallic deodorant Jethro Poling) MISC Apply 1 application topically daily.     No facility-administered medications prior to visit.    PAST MEDICAL HISTORY: Past Medical History  Diagnosis Date  . Breast cancer ([place]) 09/29/2011  . Ovarian cancer ([place]) 09/29/2011  . Arthritis   . Pneumonia   . Hypertension   . Anemia   . Radiation 01/14/2009-02/10/2009    left breast 45 Gy, boosted to 5 Gy    PAST SURGICAL HISTORY: Past Surgical History  Procedure Laterality Date  . Abdominal hysterectomy    . Breast  lumpectomy    . Tonsillectomy    . Appendectomy    . Bladder surgery    . Breast lumpectomy with radioactive seed localization Right 04/07/2015    Procedure: BREAST LUMPECTOMY WITH RADIOACTIVE SEED LOCALIZATION;  Surgeon: Erroll Luna, MD;  Location: [place];  Service: General;  Laterality: Right;  . Port-a-cath removal N/A 07/13/2015    Procedure: REMOVAL PORT-A-CATH;  Surgeon: Erroll Luna, MD;  Location: [place];  Service: General;  Laterality: N/A;    FAMILY  HISTORY: Family History  Problem Relation Age of Onset  . Ovarian cancer Mother 18  . Colon cancer Brother 74  . Breast cancer Paternal Aunt     6-7 with breast cancer; one bilateral, several premenopausal  . Pancreatitis Brother 17  . Colon cancer Paternal Aunt   . Cancer Cousin     NOS    SOCIAL HISTORY:  Social History   Social History  . Marital Status: Married    Spouse Name: Juanda Crumble  . Number of Children: 2  . Years of Education: 16   Occupational History  .      retired   Social History Main Topics  . Smoking status: Former Smoker -- 0.00 packs/day for 58 years  . Smokeless tobacco: Never Used     Comment: not smoking while using nicotine patch 03-26-12, 11/29/15 not using nicotene patch now  . Alcohol Use: 0.6 oz/week    1 Shots of liquor per week     Comment: 11/29/15 cocktail on weekends  . Drug Use: No  . Sexual Activity: Yes    Birth Control/ Protection: Post-menopausal   Other Topics Concern  . Not on file   Social History Narrative   Lives with husband   Caffeine use- coffee 2 cups daily, sometimes tea     PHYSICAL EXAM  GENERAL EXAM/CONSTITUTIONAL: Vitals:  Filed Vitals:   11/29/15 0856  Height: 5\' 6"  (1.676 m)     There is no weight on file to calculate BMI.  Visual Acuity Screening   Right eye Left eye Both eyes  Without correction: 20/20 20/20   With correction:        Patient is in no distress; well developed, nourished and groomed; neck is supple  CARDIOVASCULAR:  Examination of carotid arteries is normal; no carotid bruits  Regular rate and rhythm, no murmurs  Examination of peripheral vascular system by observation and palpation is normal  EYES:  Ophthalmoscopic exam of optic discs and posterior segments is normal; no papilledema or hemorrhages  MUSCULOSKELETAL:  Gait, strength, tone, movements noted in Neurologic exam below  NEUROLOGIC: MENTAL STATUS:  No flowsheet data found.  awake, alert, oriented to person,  place and time  recent and remote memory intact  normal attention and concentration  language fluent, comprehension intact, naming intact,   fund of knowledge appropriate  CRANIAL NERVE:   2nd - no papilledema on fundoscopic exam  2nd, 3rd, 4th, 6th - pupils equal and reactive to light, visual fields full to confrontation, extraocular muscles intact, no nystagmus  5th - facial sensation symmetric  7th - facial strength symmetric  8th - hearing intact  9th - palate elevates symmetrically, uvula midline  11th - shoulder shrug symmetric  12th - tongue protrusion midline  MOTOR:   normal bulk and tone, full strength in the BUE, BLE  SENSORY:   normal and symmetric to light touch, pinprick, temperature, vibration  COORDINATION:   finger-nose-finger, fine finger movements --> SLOW MOVEMENTS  REFLEXES:   deep tendon reflexes TRACE and symmetric  GAIT/STATION:   narrow based gait; STAGGERS, THEN PAUSES WHEN FIRST STANDING UP; THEN ABLE TO WALK SMOOTHLY; PAUSES WITH TURNING  CAUTIOUS WITH SITTING AND STANDING    DIAGNOSTIC DATA (LABS, IMAGING, TESTING) - I reviewed patient records, labs, notes, testing and imaging myself where available.  Lab Results  Component Value Date   WBC 3.9 10/18/2015   HGB 12.8 10/18/2015   HCT 38.3 10/18/2015   MCV 96.6 10/18/2015   PLT 110* 10/18/2015      Component Value Date/Time   NA 142 10/18/2015 0901   NA 140 04/06/2015 1020   K 3.4* 10/18/2015 0901   K 4.0 04/06/2015 1020   CL 108 04/06/2015 1020   CL 112* 10/24/2012 1119   CO2 26 10/18/2015 0901   CO2 28 04/06/2015 1020   GLUCOSE 106 10/18/2015 0901   GLUCOSE 81 04/06/2015 1020   GLUCOSE 103* 10/24/2012 1119   BUN 12.5 10/18/2015 0901   BUN 12 04/06/2015 1020   CREATININE 0.9 10/18/2015 0901   CREATININE 0.85 04/06/2015 1020   CALCIUM 8.7 10/18/2015 0901   CALCIUM 8.4* 04/06/2015 1020   PROT 7.3 10/18/2015 0901   PROT 7.2 04/06/2015 1020   ALBUMIN 3.1*  10/18/2015 0901   ALBUMIN 3.1* 04/06/2015 1020   AST 18 10/18/2015 0901   AST 20 04/06/2015 1020   ALT 13 10/18/2015 0901   ALT 16 04/06/2015 1020   ALKPHOS 48 10/18/2015 0901   ALKPHOS 48 04/06/2015 1020   BILITOT 0.55 10/18/2015 0901   BILITOT 0.8 04/06/2015 1020   GFRNONAA >60 04/06/2015 1020   GFRAA >60 04/06/2015 1020   No results found for: CHOL, HDL, LDLCALC, LDLDIRECT, TRIG, CHOLHDL No results found for: HGBA1C No results found for: VITAMINB12 No results found for: TSH   11/09/15 MRI brain [I reviewed images myself and agree with interpretation. -VRP]  - No evidence of intracranial metastatic disease. - No acute infarct or intracranial hemorrhage. - Mild chronic microvascular white matter changes. - Cervical spondylotic changes with mild cord flattening C3-4 level.  11/23/15 carotid u/s - No evidence of focal carotid stenosis.     ASSESSMENT AND PLAN  75 y.o. year old female here with dizziness since September 2016, which patient describes as balancing coordination difficulty with movement of position, getting up, bending over, turning. May represent peripheral vestibulopathy or peripheral neuropathy. Will check additional lab testing and refer patient to balance/vestibular physical therapy.   Ddx: peripheral vestibulopathy vs neuropathy  1. Gait difficulty   2. Dizziness and giddiness      PLAN: - add'l workup and vestibular PT  Orders Placed This Encounter  Procedures  . Vitamin B12  . Hemoglobin A1c  . TSH  . PT vestibular rehab   Return in about 3 months (around 02/29/2016).  I reviewed images, labs, notes, records myself. I summarized findings and reviewed with patient, for this moderate risk condition (balance and gait difficulty / dizziness) requiring high complexity decision making.     Penni Bombard, MD [id number], [id number] AM Certified in Neurology, Neurophysiology and Neuroimaging  [facility] [address] [place], [place] [address] [phone]

## 2015-11-30 ENCOUNTER — Telehealth: Payer: Self-pay | Admitting: *Deleted

## 2015-11-30 LAB — TSH: TSH: 1.74 u[IU]/mL (ref 0.450–4.500)

## 2015-11-30 LAB — VITAMIN B12: Vitamin B-12: 515 pg/mL (ref 211–946)

## 2015-11-30 LAB — HEMOGLOBIN A1C
Est. average glucose Bld gHb Est-mCnc: 105 mg/dL
Hgb A1c MFr Bld: 5.3 % (ref 4.8–5.6)

## 2015-11-30 NOTE — Telephone Encounter (Signed)
Spoke with patient and informed all her lab results are normal. She verbalized understanding, appreciation.

## 2015-12-21 DIAGNOSIS — Z853 Personal history of malignant neoplasm of breast: Secondary | ICD-10-CM | POA: Diagnosis not present

## 2015-12-23 ENCOUNTER — Encounter: Payer: Self-pay | Admitting: Oncology

## 2015-12-23 NOTE — Progress Notes (Signed)
Medical Oncology  Report of 3D bilateral diagnostic mammograms done at [facility] 12-21-15 received and will be scanned into EMR. No mammographic evidence of malignancy.  Godfrey Pick, MD

## 2016-02-18 ENCOUNTER — Other Ambulatory Visit: Payer: Self-pay

## 2016-02-18 DIAGNOSIS — C569 Malignant neoplasm of unspecified ovary: Secondary | ICD-10-CM

## 2016-02-20 ENCOUNTER — Other Ambulatory Visit: Payer: Self-pay | Admitting: Oncology

## 2016-02-21 ENCOUNTER — Encounter: Payer: Self-pay | Admitting: Oncology

## 2016-02-21 ENCOUNTER — Other Ambulatory Visit (HOSPITAL_BASED_OUTPATIENT_CLINIC_OR_DEPARTMENT_OTHER): Payer: Medicare Other

## 2016-02-21 ENCOUNTER — Telehealth: Payer: Self-pay | Admitting: Oncology

## 2016-02-21 ENCOUNTER — Ambulatory Visit (HOSPITAL_BASED_OUTPATIENT_CLINIC_OR_DEPARTMENT_OTHER): Payer: Medicare Other | Admitting: Oncology

## 2016-02-21 VITALS — BP 150/66 | HR 84 | Temp 98.6°F | Resp 18 | Ht 66.0 in | Wt 181.2 lb

## 2016-02-21 DIAGNOSIS — C569 Malignant neoplasm of unspecified ovary: Secondary | ICD-10-CM | POA: Diagnosis not present

## 2016-02-21 DIAGNOSIS — Z87891 Personal history of nicotine dependence: Secondary | ICD-10-CM | POA: Diagnosis not present

## 2016-02-21 DIAGNOSIS — D0511 Intraductal carcinoma in situ of right breast: Secondary | ICD-10-CM

## 2016-02-21 DIAGNOSIS — Z1509 Genetic susceptibility to other malignant neoplasm: Secondary | ICD-10-CM

## 2016-02-21 DIAGNOSIS — Z8543 Personal history of malignant neoplasm of ovary: Secondary | ICD-10-CM | POA: Diagnosis not present

## 2016-02-21 DIAGNOSIS — Z860100 Personal history of colon polyps, unspecified: Secondary | ICD-10-CM

## 2016-02-21 DIAGNOSIS — D696 Thrombocytopenia, unspecified: Secondary | ICD-10-CM | POA: Diagnosis not present

## 2016-02-21 DIAGNOSIS — Z79810 Long term (current) use of selective estrogen receptor modulators (SERMs): Secondary | ICD-10-CM | POA: Diagnosis not present

## 2016-02-21 DIAGNOSIS — Z1501 Genetic susceptibility to malignant neoplasm of breast: Secondary | ICD-10-CM

## 2016-02-21 DIAGNOSIS — Z853 Personal history of malignant neoplasm of breast: Secondary | ICD-10-CM

## 2016-02-21 DIAGNOSIS — C50311 Malignant neoplasm of lower-inner quadrant of right female breast: Secondary | ICD-10-CM

## 2016-02-21 DIAGNOSIS — Z170 Estrogen receptor positive status [ER+]: Secondary | ICD-10-CM | POA: Diagnosis not present

## 2016-02-21 LAB — COMPREHENSIVE METABOLIC PANEL
ALT: 12 U/L (ref 0–55)
AST: 17 U/L (ref 5–34)
Albumin: 3.1 g/dL — ABNORMAL LOW (ref 3.5–5.0)
Alkaline Phosphatase: 46 U/L (ref 40–150)
Anion Gap: 6 mEq/L (ref 3–11)
BUN: 10.9 mg/dL (ref 7.0–26.0)
CO2: 26 mEq/L (ref 22–29)
Calcium: 8.7 mg/dL (ref 8.4–10.4)
Chloride: 110 mEq/L — ABNORMAL HIGH (ref 98–109)
Creatinine: 0.9 mg/dL (ref 0.6–1.1)
EGFR: 68 mL/min/{1.73_m2} — ABNORMAL LOW (ref >90)
Glucose: 83 mg/dl (ref 70–140)
Potassium: 3.4 mEq/L — ABNORMAL LOW (ref 3.5–5.1)
Sodium: 142 mEq/L (ref 136–145)
Total Bilirubin: 0.57 mg/dL (ref 0.20–1.20)
Total Protein: 7.3 g/dL (ref 6.4–8.3)

## 2016-02-21 LAB — CBC WITH DIFFERENTIAL/PLATELET
BASO%: 0.7 % (ref 0.0–2.0)
Basophils Absolute: 0 10*3/uL (ref 0.0–0.1)
EOS%: 1.7 % (ref 0.0–7.0)
Eosinophils Absolute: 0.1 10*3/uL (ref 0.0–0.5)
HCT: 36.4 % (ref 34.8–46.6)
HGB: 12.1 g/dL (ref 11.6–15.9)
LYMPH%: 20.7 % (ref 14.0–49.7)
MCH: 32.3 pg (ref 25.1–34.0)
MCHC: 33.2 g/dL (ref 31.5–36.0)
MCV: 97.3 fL (ref 79.5–101.0)
MONO#: 0.3 10*3/uL (ref 0.1–0.9)
MONO%: 7.2 % (ref 0.0–14.0)
NEUT#: 3.1 10*3/uL (ref 1.5–6.5)
NEUT%: 69.7 % (ref 38.4–76.8)
Platelets: 108 10*3/uL — ABNORMAL LOW (ref 145–400)
RBC: 3.74 10*6/uL (ref 3.70–5.45)
RDW: 14.2 % (ref 11.2–14.5)
WBC: 4.5 10*3/uL (ref 3.9–10.3)
lymph#: 0.9 10*3/uL (ref 0.9–3.3)

## 2016-02-21 MED ORDER — TAMOXIFEN CITRATE 20 MG PO TABS: 20.0000 mg | ORAL_TABLET | Freq: Every day | ORAL | Status: DC

## 2016-02-21 NOTE — Progress Notes (Signed)
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  Most recent bilateral tomo mammograms were at [facility] 12-21-15, with no mammographic findings of concern. She should have breast MRI prior to 03-21-16 as screening with the known BRCA 2 mutation.  She saw Dr Josephina Shih 10-29-15, follow up with him recommended in 1 year. His note mentions referral to Dr [place] Pink for urinary incontinence, tho patient did not get this appointment set up. I have sent message now to gyn oncology staff to follow up, as I do not know that MD.   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 She continues lymphedema massage daily as instructed.   Patient reports that she has been doing well. Energy is good. Appetite good, watching diet as she intentionally loses weight with goal 170 lbs. She still notices dizziness with certain head positions, was evaluated by neurology and had negative carotid dopplers 11-23-15. No HA, no neck pain. No other neurologic symptoms. She is not aware of any changes in breasts other than improvement in lymphedema on right. She has had no symptoms of infection with the right breast lymphedema, and no swelling RUE. She denies increased SOB or change in some chronic cough. She denies new or different pain.  No change in bowels, no abdominal or pelvic pain or abdominal swelling. No extremity swelling. No bleeding or unusual bruising.  Remainder of 10 point Review of Systems negative/ unchanged.  She has long history of tobacco abuse, now [facility]. Last CXR 10-09-14. She tells me " I am still on cigarette break".  She has  had mild thrombocytopenia x years.  PAC out BRCA 2 + 08-2014  ONCOLOGIC HISTORY RIGHT BREAST DCIS Patient has been on adjuvant tamoxifen for left breast cancer since Aug 2011, previously intolerant to Femara and Aromasin with severe arthralgias. Bilateral 3D mammograms were at [facility] 12-16-14 had no mamographic findings of concern. With the BRCA abnormality, she had breast MRI on 01-27-15, with 5 x 8 x 9 mm area of concern in right central breast. She had US biopsy at [facility] on 02-09-15 with benign path ([id number]). She then had MRI core biopsy on 02-24-15 which identified intermediate grade DCIS with comedo necrosis, ER + 95% and PR 0. The pathology information was reported to radiologist, and patient referred to general surgery. Mastectomy was discussed due to her BRCA mutation, however she preferred lumpectomy. She had seed localized partial mastectomy by Dr Brantley Stage on 04-07-15, with pathology showing 1.1 cm intermediate grade DCIS with necrosis, closest margin < 1 mm ([id number]). Patient reportedly declined reexcision of the close margin. She had radiation 3D conformal + boost for total 50 Gy 05-17-15 thru 06-14-15.  LEFT BREAST cancer was diagnosed in March 2010, multifocal T1N0 at lumpectomy and sentinel node evaluation, ER/PR positive and HER 2 negative. She had local radiation, was intolerant to Femara and Aromasin due to severe arthralgias, and has been on tamoxifen since Aug 2011.  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 Surgery was TAH/BSO/omentectomy and radical complete debulking including anterior  rectal resection with anastomosis. She had 6 cycles of adjuvant taxol/ carboplatin from 04-18-2005 thru 08-14-2005. She is followed yearly by gyn oncology She was found to have BRCA 2 mutation by testing 08-2014   Objective:  Vital signs in  last 24 hours:  BP 150/66 mmHg  Pulse 84  Temp(Src) 98.6 F (37 C) (Oral)  Resp 18  Ht '5\' 6"'  (1.676 m)  Wt 181 lb 3.2 oz (82.192 kg)  BMI 29.26 kg/m2  SpO2 99% Weight down 10 lbs intentional in past 4 months. Occasional cough sounds NP, not SOB with activity in exam room Alert, oriented and appropriate. Ambulatory without difficulty.  HEENT:PERRL, sclerae not icteric. Oral mucosa moist without lesions, posterior pharynx clear.  Neck supple. No JVD.  Lymphatics:no cervical,suraclavicular, axillary or inguinal adenopathy Resp: clear to auscultation bilaterally and normal percussion bilaterally Cardio: regular rate and rhythm. No gallop. GI: soft, nontender, not distended, no mass or organomegaly. Normally active bowel sounds. Surgical incision not remarkable. Musculoskeletal/ Extremities: without pitting edema, cords, tenderness Neuro: nonfocal  PSYCH approprate mood and affect Skin without rash, ecchymosis, petechiae Breasts: Left with lumpectomy scar well healed, right with improved lymphedema no erythema or evidence of cellulitis, otherwise bilaterally without dominant mass, skin or nipple findings of concern. Axillae benign. Scar from previous PAC well healed.  Lab Results:  Results for orders placed or performed in visit on 02/21/16  CBC with Differential  Result Value Ref Range   WBC 4.5 3.9 - 10.3 10e3/uL   NEUT# 3.1 1.5 - 6.5 10e3/uL   HGB 12.1 11.6 - 15.9 g/dL   HCT 36.4 34.8 - 46.6 %   Platelets 108 (L) 145 - 400 10e3/uL   MCV 97.3 79.5 - 101.0 fL   MCH 32.3 25.1 - 34.0 pg   MCHC 33.2 31.5 - 36.0 g/dL   RBC 3.74 3.70 - 5.45 10e6/uL   RDW 14.2 11.2 - 14.5 %   lymph# 0.9 0.9 - 3.3 10e3/uL   MONO# 0.3 0.1 - 0.9 10e3/uL   Eosinophils Absolute 0.1 0.0 - 0.5 10e3/uL   Basophils Absolute 0.0 0.0 - 0.1 10e3/uL   NEUT% 69.7 38.4 - 76.8 %   LYMPH% 20.7 14.0 - 49.7 %   MONO% 7.2 0.0 - 14.0 %   EOS% 1.7 0.0 - 7.0 %   BASO% 0.7 0.0 - 2.0 %  Comprehensive metabolic panel   Result Value Ref Range   Sodium 142 136 - 145 mEq/L   Potassium 3.4 (L) 3.5 - 5.1 mEq/L   Chloride 110 (H) 98 - 109 mEq/L   CO2 26 22 - 29 mEq/L   Glucose 83 70 - 140 mg/dl   BUN 10.9 7.0 - 26.0 mg/dL   Creatinine 0.9 0.6 - 1.1 mg/dL   Total Bilirubin 0.57 0.20 - 1.20 mg/dL   Alkaline Phosphatase 46 40 - 150 U/L   AST 17 5 - 34 U/L   ALT 12 0 - 55 U/L   Total Protein 7.3 6.4 - 8.3 g/dL   Albumin 3.1 (L) 3.5 - 5.0 g/dL   Calcium 8.7 8.4 - 10.4 mg/dL   Anion Gap 6 3 - 11 mEq/L   EGFR 68 (L) >90 ml/min/1.73 m2   CA 125 available after visit 26.4 by new lab method,  Stable at 12 by previous lab method "parallel testing"  Studies/Results:  Mammogram report from [place] reviewed in EMR, unable to copy over to this note  Medications: I have reviewed the patient's current medications. Continue tamoxifen  DISCUSSION Discussed  rationale for MRI breast due to BRCA 2 abnormality, patient in agreement, understands that this needs to be done within 3 months of mammograms per [facility] radiologists, so prior to July 4 Encouraged ongoing avoidance of smoking.  Assessment/Plan:  1.Right DCIS/ past left breast ca/ BRCA 2: Continue tamoxifen, mammograms done and MRI as above. Dr Brantley Stage also following. If any other breast malignancy, she should be encouraged to have bilateral mastectomies.  2. 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 3.IIIC papillary serous ovarian ca diagnosed 03-2005. With BRCA 2 abnormality now documented, gyn oncology has seen again (Dr Josephina Shih 10-2015) and will follow yearly.  4.ANAPHYLAXIS TO CHLORHEXIDINE :  Patient had anaphylactic reaction to chlorhexidine 05-2015,, when used to prep PAC for access. PAC was removed by Dr Brantley Stage 06-2015 to avoid risk of accidental exposure to chlorhexidine with that line. 5.BRCA 2 mutation 6.near syncope with certain  head positions. Carotid dopplers and neurology evaluation ok. Continues 81 mg ASA and has follow up with neurology scheduled.  7.long tobacco DCd. Encouraged her to remain nonsmoking 8.mild thrombocytopenia x years, essentially stable, no bleeding, other counts ok. Low dose ASA ok. Some ETOH could be etiology 9. PAC out 10.intentional weight loss, goal weight 170.  11.history of colon polyps, followed by Dr Collene Mares  All questions answered and patient knows to call if needed prior to next scheduled visit. Route PCP, cc Dr Brantley Stage, Dr Collene Mares; neurologist and gyn onc in [facility]. Time spent 25 min including >50% counseling and coordination of care.    Danelly Hassinger P, MD   02/21/2016, 2:15 PM

## 2016-02-21 NOTE — Telephone Encounter (Signed)
appt made and avs printed °

## 2016-02-22 LAB — CA 125: Cancer Antigen (CA) 125: 26.4 U/mL (ref 0.0–38.1)

## 2016-02-22 LAB — CANCER ANTIGEN 125 (PARALLEL TESTING): CA 125: 12 U/mL (ref <35)

## 2016-02-23 ENCOUNTER — Other Ambulatory Visit: Payer: Self-pay | Admitting: Oncology

## 2016-02-25 ENCOUNTER — Ambulatory Visit (HOSPITAL_COMMUNITY)
Admission: RE | Admit: 2016-02-25 | Discharge: 2016-02-25 | Disposition: A | Discharge status: Home / Self Care | Payer: Medicare Other | Source: Ambulatory Visit | Attending: Oncology | Admitting: Oncology

## 2016-02-25 DIAGNOSIS — Z1509 Genetic susceptibility to other malignant neoplasm: Principal | ICD-10-CM

## 2016-02-25 DIAGNOSIS — Z1501 Genetic susceptibility to malignant neoplasm of breast: Secondary | ICD-10-CM

## 2016-02-29 ENCOUNTER — Ambulatory Visit: Payer: Medicare Other | Admitting: Diagnostic Neuroimaging

## 2016-04-26 DIAGNOSIS — N3281 Overactive bladder: Secondary | ICD-10-CM | POA: Diagnosis not present

## 2016-04-26 DIAGNOSIS — N952 Postmenopausal atrophic vaginitis: Secondary | ICD-10-CM | POA: Diagnosis not present

## 2016-04-26 DIAGNOSIS — R351 Nocturia: Secondary | ICD-10-CM | POA: Diagnosis not present

## 2016-04-26 DIAGNOSIS — N3946 Mixed incontinence: Secondary | ICD-10-CM | POA: Diagnosis not present

## 2016-05-10 DIAGNOSIS — N3946 Mixed incontinence: Secondary | ICD-10-CM | POA: Diagnosis not present

## 2016-06-07 ENCOUNTER — Telehealth: Payer: Self-pay | Admitting: *Deleted

## 2016-06-07 NOTE — Telephone Encounter (Signed)
On 06-07-16 fax medical records to [place] breast cancer study, it was consult note, end of tx note, follow up note.

## 2016-07-05 DIAGNOSIS — N3281 Overactive bladder: Secondary | ICD-10-CM | POA: Diagnosis not present

## 2016-07-05 DIAGNOSIS — N3946 Mixed incontinence: Secondary | ICD-10-CM | POA: Diagnosis not present

## 2016-08-13 ENCOUNTER — Other Ambulatory Visit: Payer: Self-pay | Admitting: Oncology

## 2016-08-13 DIAGNOSIS — Z1501 Genetic susceptibility to malignant neoplasm of breast: Secondary | ICD-10-CM

## 2016-08-13 DIAGNOSIS — Z1509 Genetic susceptibility to other malignant neoplasm: Secondary | ICD-10-CM

## 2016-08-13 DIAGNOSIS — C569 Malignant neoplasm of unspecified ovary: Secondary | ICD-10-CM

## 2016-08-16 DIAGNOSIS — N3281 Overactive bladder: Secondary | ICD-10-CM | POA: Diagnosis not present

## 2016-08-16 DIAGNOSIS — K5900 Constipation, unspecified: Secondary | ICD-10-CM | POA: Diagnosis not present

## 2016-08-21 ENCOUNTER — Other Ambulatory Visit (HOSPITAL_BASED_OUTPATIENT_CLINIC_OR_DEPARTMENT_OTHER): Payer: Medicare Other

## 2016-08-21 ENCOUNTER — Ambulatory Visit (HOSPITAL_BASED_OUTPATIENT_CLINIC_OR_DEPARTMENT_OTHER): Payer: Medicare Other | Admitting: Oncology

## 2016-08-21 ENCOUNTER — Encounter: Payer: Self-pay | Admitting: Oncology

## 2016-08-21 VITALS — BP 156/77 | HR 68 | Temp 97.8°F | Resp 17 | Ht 66.0 in | Wt 181.6 lb

## 2016-08-21 DIAGNOSIS — Z79810 Long term (current) use of selective estrogen receptor modulators (SERMs): Secondary | ICD-10-CM

## 2016-08-21 DIAGNOSIS — Z170 Estrogen receptor positive status [ER+]: Secondary | ICD-10-CM

## 2016-08-21 DIAGNOSIS — Z853 Personal history of malignant neoplasm of breast: Secondary | ICD-10-CM

## 2016-08-21 DIAGNOSIS — D0511 Intraductal carcinoma in situ of right breast: Secondary | ICD-10-CM

## 2016-08-21 DIAGNOSIS — Z23 Encounter for immunization: Secondary | ICD-10-CM | POA: Diagnosis present

## 2016-08-21 DIAGNOSIS — Z1502 Genetic susceptibility to malignant neoplasm of ovary: Secondary | ICD-10-CM | POA: Diagnosis not present

## 2016-08-21 DIAGNOSIS — I890 Lymphedema, not elsewhere classified: Secondary | ICD-10-CM

## 2016-08-21 DIAGNOSIS — Z87891 Personal history of nicotine dependence: Secondary | ICD-10-CM

## 2016-08-21 DIAGNOSIS — Z8543 Personal history of malignant neoplasm of ovary: Secondary | ICD-10-CM | POA: Diagnosis not present

## 2016-08-21 DIAGNOSIS — C569 Malignant neoplasm of unspecified ovary: Secondary | ICD-10-CM

## 2016-08-21 DIAGNOSIS — Z1501 Genetic susceptibility to malignant neoplasm of breast: Secondary | ICD-10-CM | POA: Diagnosis not present

## 2016-08-21 DIAGNOSIS — Z1509 Genetic susceptibility to other malignant neoplasm: Secondary | ICD-10-CM

## 2016-08-21 DIAGNOSIS — Z1239 Encounter for other screening for malignant neoplasm of breast: Secondary | ICD-10-CM

## 2016-08-21 DIAGNOSIS — D696 Thrombocytopenia, unspecified: Secondary | ICD-10-CM

## 2016-08-21 LAB — CBC WITH DIFFERENTIAL/PLATELET
BASO%: 0.2 % (ref 0.0–2.0)
Basophils Absolute: 0 10*3/uL (ref 0.0–0.1)
EOS%: 1.6 % (ref 0.0–7.0)
Eosinophils Absolute: 0.1 10*3/uL (ref 0.0–0.5)
HCT: 35.1 % (ref 34.8–46.6)
HGB: 11.9 g/dL (ref 11.6–15.9)
LYMPH%: 24.3 % (ref 14.0–49.7)
MCH: 32.8 pg (ref 25.1–34.0)
MCHC: 33.9 g/dL (ref 31.5–36.0)
MCV: 96.7 fL (ref 79.5–101.0)
MONO#: 0.4 10*3/uL (ref 0.1–0.9)
MONO%: 8.1 % (ref 0.0–14.0)
NEUT#: 2.9 10*3/uL (ref 1.5–6.5)
NEUT%: 65.8 % (ref 38.4–76.8)
Platelets: 98 10*3/uL — ABNORMAL LOW (ref 145–400)
RBC: 3.63 10*6/uL — ABNORMAL LOW (ref 3.70–5.45)
RDW: 13.7 % (ref 11.2–14.5)
WBC: 4.5 10*3/uL (ref 3.9–10.3)
lymph#: 1.1 10*3/uL (ref 0.9–3.3)
nRBC: 0 % (ref 0–0)

## 2016-08-21 LAB — COMPREHENSIVE METABOLIC PANEL
ALT: 13 U/L (ref 0–55)
AST: 21 U/L (ref 5–34)
Albumin: 3 g/dL — ABNORMAL LOW (ref 3.5–5.0)
Alkaline Phosphatase: 61 U/L (ref 40–150)
Anion Gap: 6 mEq/L (ref 3–11)
BUN: 11 mg/dL (ref 7.0–26.0)
CO2: 25 mEq/L (ref 22–29)
Calcium: 8.6 mg/dL (ref 8.4–10.4)
Chloride: 112 mEq/L — ABNORMAL HIGH (ref 98–109)
Creatinine: 0.9 mg/dL (ref 0.6–1.1)
EGFR: 73 mL/min/{1.73_m2} — ABNORMAL LOW (ref >90)
Glucose: 102 mg/dl (ref 70–140)
Potassium: 3.5 mEq/L (ref 3.5–5.1)
Sodium: 144 mEq/L (ref 136–145)
Total Bilirubin: 0.6 mg/dL (ref 0.20–1.20)
Total Protein: 7.1 g/dL (ref 6.4–8.3)

## 2016-08-21 MED ORDER — INFLUENZA VAC SPLIT QUAD 0.5 ML IM SUSY
0.5000 mL | PREFILLED_SYRINGE | Freq: Once | INTRAMUSCULAR | Status: AC
  Administered 2016-08-21: 0.5 mL via INTRAMUSCULAR
  Filled 2016-08-21: qty 0.5

## 2016-08-21 NOTE — Progress Notes (Signed)
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 She is BRCA 2+. She had bilateral 3D diagnostic mammograms at [facility] 01-11-16; she did not have breast MRI done following mammograms as ordered, that as screening with BRCA 2 mutation.  She saw Dr Josephina Shih last 10-2015, appointment set up now for 11-06-16. Last CT AP in this EMR 05-2013.  Patient has established with Dr Maryland Pink for urinary incontinence, her note from 08-16-16 reviewed in [place]. Oxybutynin is helping, with bladder retraining. She has had mild thrombocytopenia x years, asymptomatic. 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 Has not had bone marrow exam.  Patient feels generally well. She has not noticed any changes in either breast. Lymphedema massage for right breast has been helpful. She denies abdominal or pelvic discomfort or swelling, appetite is good, bowels unchanged. She denies increased SOB, does notice intermittent wheezing, remains off of cigarettes, has not used inhalers recently. She has had no recent respiratory or other infections. No LE swelling. No bleeding or unusual bruising. No new or different pain. Dry mouth with ditropan.  Remainder of 10 point Review of Systems negative  Flu vaccine 08-21-16 (PAC removed) BRCA 2 + 08-2014  ONCOLOGIC HISTORY RIGHT BREAST DCIS Patient has been on adjuvant tamoxifen for left breast cancer since Aug 2011, previously intolerant to Femara and Aromasin with severe arthralgias. Bilateral 3D mammograms were at [facility] 12-16-14 had no mamographic findings of concern. With the BRCA abnormality,  she had breast MRI on 01-27-15, with 5 x 8 x 9 mm area of concern in right central breast. She had US biopsy at [facility] on 02-09-15 with benign path ([id number]). She then had MRI core biopsy on 02-24-15 which identified intermediate grade DCIS with comedo necrosis, ER + 95% and PR 0. The pathology information was reported to radiologist, and patient referred to general surgery. Mastectomy was discussed due to her BRCA mutation, however she preferred lumpectomy. She had seed localized partial mastectomy by Dr Brantley Stage on 04-07-15, with pathology showing 1.1 cm intermediate grade DCIS with necrosis, closest margin < 1 mm ([id number]). Patient reportedly declined reexcision of the close margin. She had radiation 3D conformal + boost for total 50 Gy 05-17-15 thru 06-14-15.  LEFT BREAST cancer was diagnosed in March 2010, multifocal T1N0 at lumpectomy and sentinel node evaluation, ER/PR positive and HER 2 negative. She had local radiation, was intolerant to Femara and Aromasin due to severe arthralgias, and has been on tamoxifen since Aug 2011.  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 Surgery was TAH/BSO/omentectomy and radical complete debulking including anterior rectal resection with anastomosis. She had 6 cycles of adjuvant taxol/ carboplatin from 04-18-2005 thru 08-14-2005. She is followed yearly by gyn oncology She was found to have BRCA 2 mutation by testing 08-2014    Objective:  Vital signs in last 24 hours: Weight 181 lb 9 oz, stable. BMI 29.4. 156/77, 68 regular, respirations 17 not labored RA, 97.8 Looks comfortable, very pleasant as always.  Alert, oriented and appropriate. Ambulatory without difficulty.    HEENT:PERRL, sclerae not  icteric. Oral mucosa moist without lesions, posterior pharynx clear.  Neck supple. No JVD.  Lymphatics:no  cervical,supraclavicular, axillary or inguinal adenopathy Resp: lungs with slight expiratory wheezing bilateral lower fields, no rales or crackles and no dullness to percussion bilaterally Cardio: regular rate and rhythm. No gallop. GI: soft, nontender, not distended, no mass or organomegaly, cannot feel spleen. Normally active bowel sounds. Surgical incision not remarkable. Musculoskeletal/ Extremities: UE / LE without pitting edema, cords, tenderness Neuro: nonfocal  PSYCH appropriate mood and affect Skin without rash, ecchymosis, petechiae Breasts: left lumpectomy scar well healed without evidence of local recurrence, right lumpectomy scar likewise well healed and not remarkable, slight lymphedema right breast but improved, otherwise  without dominant mass, skin or nipple findings bilaterally. Axillae benign. Portacath scar not remarkable.  Lab Results:  Results for orders placed or performed in visit on 08/21/16  CBC with Differential  Result Value Ref Range   WBC 4.5 3.9 - 10.3 10e3/uL   NEUT# 2.9 1.5 - 6.5 10e3/uL   HGB 11.9 11.6 - 15.9 g/dL   HCT 35.1 34.8 - 46.6 %   Platelets 98 (L) 145 - 400 10e3/uL   MCV 96.7 79.5 - 101.0 fL   MCH 32.8 25.1 - 34.0 pg   MCHC 33.9 31.5 - 36.0 g/dL   RBC 3.63 (L) 3.70 - 5.45 10e6/uL   RDW 13.7 11.2 - 14.5 %   lymph# 1.1 0.9 - 3.3 10e3/uL   MONO# 0.4 0.1 - 0.9 10e3/uL   Eosinophils Absolute 0.1 0.0 - 0.5 10e3/uL   Basophils Absolute 0.0 0.0 - 0.1 10e3/uL   NEUT% 65.8 38.4 - 76.8 %   LYMPH% 24.3 14.0 - 49.7 %   MONO% 8.1 0.0 - 14.0 %   EOS% 1.6 0.0 - 7.0 %   BASO% 0.2 0.0 - 2.0 %   nRBC 0 0 - 0 %   CMET available after visit normal with exception of chloride 112 and alb 3.0, with EGFR 73 CA 125 available after visit 22.6, this having been 26 in 02-2016 and 26 in 09-2015  Studies/Results:  No results found.  Medications: I have reviewed the patient's current medications. Continue tamoxifen No problems with 81 mg ASA daily  now   DISCUSSION Reminded patient of rationale for breast MRI yearly within 3 months after mammograms, as additional best practice screening with BRCA 2 mutation. She will be due mammograms again at [facility] in 12-2016. She is tolerating tamoxifen without difficulty, seems reasonable to continue as she was intolerant to AIs attempted previously. Mastectomies would be appropriate from standpoint of BRCA mutation, tho she did not want this with most recent breast surgery.   Encouraged her to continue massage as instructed by lymphedema PT for right breast. She will follow at [facility] with breast medical oncologist after Jan. I will request appointment in May, as she should have mammograms late April.   Follow up with Dr Josephina Shih 10-2016, with past ovarian ca and BRCA 2 mutation.  She is encouraged to remain off of cigarettes. She will resume inhaler regularly for now.    Assessment/Plan:  1.Right DCIS found on MRI, ER +, post lumpectomy with radiation and continuing tamoxifen. BRCA 2 positive, however patient  Hx  left breast mutlifocal T1N0 ER PR + and HER 2 negative 11-2008: Continue tamoxifen, mammograms done and MRI as above. Dr Brantley Stage also following. If any other breast malignancy, she should be encouraged to have bilateral mastectomies.  2. lymphedema right breast post surgery and RT: improved with lymphedema  PT interventions, patient continuing massage herself. 3.IIIC papillary serous ovarian ca diagnosed 03-2005. With BRCA 2 abnormality now documented, gyn oncology will follow yearly, appointment with Dr Josephina Shih 10-2016  4.ANAPHYLAXIS TO CHLORHEXIDINE :  05-2015 when used to prep PAC for access. PAC was removed by Dr Brantley Stage 06-2015 to avoid possible future exposure to chlorhexidine with that line. Noted in EMR. 5.BRCA 2 mutation 6.no carotid stenosis by carotid US 11-2015  7.long tobacco DCd. Encouraged her to remain nonsmoking. Resume inhaler with wheezing on exam now, follow up with MD  if does not improve. 8.mild thrombocytopenia x years, essentially stable, no bleeding, other counts ok. 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 She will call if bleeding or bruising. 9. PAC out 10.intentional weight loss, stable now 11.history of colon polyps, followed by Dr Collene Mares 12. Urinary incontinence: interventions by Dr Zigmund Daniel helpful  13.flu vaccine given today (08-21-16)  All questions answered. Time spent 25 min including >50% counseling and coordination of care. CC Drs Drema Dallas, Maryland Pink,  Cornett, Collene Mares. In EMR for Dr Josephina Shih.   Evlyn Clines, MD   08/21/2016, 10:00 AM

## 2016-08-22 ENCOUNTER — Telehealth: Payer: Self-pay

## 2016-08-22 DIAGNOSIS — R0602 Shortness of breath: Secondary | ICD-10-CM

## 2016-08-22 LAB — CA 125: Cancer Antigen (CA) 125: 22.6 U/mL (ref 0.0–38.1)

## 2016-08-22 MED ORDER — ALBUTEROL SULFATE HFA 108 (90 BASE) MCG/ACT IN AERS
2.0000 | INHALATION_SPRAY | Freq: Four times a day (QID) | RESPIRATORY_TRACT | 0 refills | Status: DC | PRN
Start: 2016-08-22 — End: 2018-01-16

## 2016-08-22 NOTE — Telephone Encounter (Signed)
Told Ms Seiss the results of the CA-125 as noted below by Dr. [name].

## 2016-08-22 NOTE — Telephone Encounter (Signed)
Message  Received: Today  Message Contents  Gordy Levan, MD  Baruch Merl, RN        If she has had inhaler from that pharmacy before, refill same   Otherwise albuterol metered dose inhaler 2 puffs every 6 hrs prn  #1 inhaler   If she notices continued wheezing, should check with PCP   thanks

## 2016-08-22 NOTE — Telephone Encounter (Signed)
-----   Message from Gordy Levan, MD sent at 08/22/2016  8:00 AM EST ----- Labs seen and need follow up: please let her know CA 125 marker is still in good low range

## 2016-08-22 NOTE — Telephone Encounter (Signed)
Monique Watson stated that Dr. [name] heard some wheezing in her lungs yesterday at her visit. She was to let Dr. [name] know if she had an inhaler at home. She does not. Dr. [name] can send in a prescription to [facility]

## 2016-08-22 NOTE — Telephone Encounter (Signed)
Sent prescription in for albuterol inhaler as noted below by Dr. [name]. Discussed calling PCP if weezing continues woth Monique Watson.  She verbalized understanding.

## 2016-09-19 DIAGNOSIS — D0511 Intraductal carcinoma in situ of right breast: Secondary | ICD-10-CM | POA: Insufficient documentation

## 2016-09-24 ENCOUNTER — Telehealth: Payer: Self-pay | Admitting: Oncology

## 2016-09-24 NOTE — Telephone Encounter (Signed)
vm advising appt on 01/16/17 @ 2.15 with Dr. Lindi Adie. Also, mailed avs.

## 2016-11-06 ENCOUNTER — Encounter: Payer: Self-pay | Admitting: Gynecology

## 2016-11-06 ENCOUNTER — Ambulatory Visit: Payer: Medicare Other | Attending: Gynecology | Admitting: Gynecology

## 2016-11-06 VITALS — BP 169/83 | HR 70 | Temp 98.7°F | Resp 18 | Ht 66.0 in | Wt 179.8 lb

## 2016-11-06 DIAGNOSIS — Z1502 Genetic susceptibility to malignant neoplasm of ovary: Secondary | ICD-10-CM

## 2016-11-06 DIAGNOSIS — Z1501 Genetic susceptibility to malignant neoplasm of breast: Secondary | ICD-10-CM

## 2016-11-06 DIAGNOSIS — C569 Malignant neoplasm of unspecified ovary: Secondary | ICD-10-CM

## 2016-11-06 DIAGNOSIS — Z8543 Personal history of malignant neoplasm of ovary: Secondary | ICD-10-CM | POA: Insufficient documentation

## 2016-11-06 DIAGNOSIS — D649 Anemia, unspecified: Secondary | ICD-10-CM | POA: Diagnosis not present

## 2016-11-06 DIAGNOSIS — Z853 Personal history of malignant neoplasm of breast: Secondary | ICD-10-CM

## 2016-11-06 DIAGNOSIS — Z923 Personal history of irradiation: Secondary | ICD-10-CM | POA: Insufficient documentation

## 2016-11-06 DIAGNOSIS — R32 Unspecified urinary incontinence: Secondary | ICD-10-CM | POA: Insufficient documentation

## 2016-11-06 DIAGNOSIS — I10 Essential (primary) hypertension: Secondary | ICD-10-CM | POA: Diagnosis not present

## 2016-11-06 DIAGNOSIS — Z08 Encounter for follow-up examination after completed treatment for malignant neoplasm: Secondary | ICD-10-CM | POA: Diagnosis not present

## 2016-11-06 NOTE — Progress Notes (Signed)
Consult Note: Gyn-Onc   Monique Watson 76 y.o. female  Chief Complaint  Patient presents with  . Ovarian Cancer     Assessment: Stage IIIc ovarian cancer 2006. BRCA mutation carrier. Clinically free of disease.Recent CA-125 was 22 units per mL (stable)  Past history of bilateral breast cancer.  Plan the patient return to see me in one year. She's encouraged to continue having annual mammograms. She's encouraged to discuss BRCA2 mutation with her daughter and encouragement daughter to be tested. She will continue to be followed by Dr. Maryland Pink for her urinary incontinence.  She will follow up with medical oncology for her breast cancer.  Interval History: The patient returns today as previously scheduled for followup. Since her last visit she's done well . She specifically denies any GI or GU symptoms. She has no pelvic pain pressure vaginal bleeding or discharge. Functional status is excellent. Since her last visit she developed breast cancer in her right breast treated with excision and radiation therapy. Patient tinged take tamoxifen.  CA-125 earlier this week was 22 (stable)  She is currently receiving medication from Dr. Mikle Bosworth for her urinary incontinence and notes some improvement. She is planning a [place] with her [place] month.  HPI: Patient underwent debulking for a stage IIIC ovarian cancer in June of 2006. She was optimally debulked with complete resection of all visible tumor (R0). She then received 6 cycles of carboplatin and Taxol chemotherapy. preoperatively her CA 125 was 2665 units per mL and dropped promptly during the chemotherapy. She's been followed since that time with no evidence recurrent disease.   Subsequent to her ovarian cancer diagnosis she is found to be BRCA2 positive and is subsequently had bilateral breast cancers treated with surgery and radiation therapy..  Review of Systems:10 point review of  systems is negative as noted above.    Vitals: Blood pressure (!) 169/83, pulse 70, temperature 98.7 F (37.1 C), temperature source Oral, resp. rate 18, height '5\' 6"'  (1.676 m), weight 179 lb 12.8 oz (81.6 kg), SpO2 99 %.  Physical Exam: General : The patient is a healthy woman in no acute distress.  HEENT: normocephalic, extraoccular movements normal; neck is supple without thyromegally  Lynphnodes: Supraclavicular and inguinal nodes not enlarged  Abdomen: Soft, non-tender, no ascites, no organomegally, no masses, no hernias  Pelvic:  EGBUS: Normal female  Vagina: Normal, no lesions  Urethra and Bladder: Normal, non-tender  Cervix: Surgically absent  Uterus: Surgically absent  Bi-manual examination: Non-tender; no adenxal masses or nodularity  Rectal: normal sphincter tone, no masses, no blood  Lower extremities: No edema or varicosities. Normal range of motion      Allergies  Allergen Reactions  . Chlorhexidine Anaphylaxis, Hives, Shortness Of Breath and Itching    anaphylaxis  . Arimidex [Anastrozole] Other (See Comments)    Body aches per patient. Irven Baltimore, RN.  Marland Kitchen Diovan [Valsartan] Other (See Comments)    arthralgias  . Uncoded Nonscreenable Allergen     Blood pressure medication patient stated that she has a low tolerance for    Past Medical History:  Diagnosis Date  . Anemia   . Arthritis   . Breast cancer ([place]) 09/29/2011  . Hypertension   . Ovarian cancer ([place]) 09/29/2011  . Pneumonia   . Radiation 01/14/2009-02/10/2009   left breast 45 Gy, boosted to 5 Gy    Past Surgical History:  Procedure Laterality Date  . ABDOMINAL HYSTERECTOMY    .  APPENDECTOMY    . BLADDER SURGERY    . BREAST LUMPECTOMY    . BREAST LUMPECTOMY WITH RADIOACTIVE SEED LOCALIZATION Right 04/07/2015   Procedure: BREAST LUMPECTOMY WITH RADIOACTIVE SEED LOCALIZATION;  Surgeon: Erroll Luna, MD;  Location: [facility];  Service: General;  Laterality: Right;  .  PORT-A-CATH REMOVAL N/A 07/13/2015   Procedure: REMOVAL PORT-A-CATH;  Surgeon: Erroll Luna, MD;  Location: [facility];  Service: General;  Laterality: N/A;  . TONSILLECTOMY      Current Outpatient Prescriptions  Medication Sig Dispense Refill  . albuterol (PROAIR HFA) 108 (90 Base) MCG/ACT inhaler Inhale 2 puffs into the lungs every 6 (six) hours as needed for wheezing. 1 Inhaler 0  . aspirin 81 MG tablet Take 81 mg by mouth daily.    . Cholecalciferol (VITAMIN D3) 2000 UNITS TABS Take 1 capsule by mouth daily.     Marland Kitchen docusate sodium (COLACE) 100 MG capsule Take 100 mg by mouth daily.    . ferrous fumarate (HEMOCYTE - 106 MG FE) 325 (106 FE) MG TABS Take 1 tablet by mouth every 7 (seven) days. No certain day    . Omega-3 Fatty Acids (FISH OIL CONCENTRATE) 1000 MG CAPS Take 1,000 mg by mouth daily.     Marland Kitchen oxybutynin (DITROPAN-XL) 10 MG 24 hr tablet Take 10 mg by mouth daily.    . polyethylene glycol powder (GLYCOLAX/MIRALAX) powder Take 17 g by mouth daily as needed.    . Potassium 99 MG TABS Take 2 tablets by mouth daily.     . tamoxifen (NOLVADEX) 20 MG tablet Take 1 tablet (20 mg total) by mouth daily. 90 tablet 2  . vitamin E (VITAMIN E) 400 UNIT capsule Take 400 Units by mouth 2 (two) times daily.     No current facility-administered medications for this visit.     Social History   Social History  . Marital status: Married    Spouse name: Juanda Crumble  . Number of children: 2  . Years of education: 16   Occupational History  .      retired   Social History Main Topics  . Smoking status: Former Smoker    Packs/day: 0.00    Years: 58.00  . Smokeless tobacco: Never Used     Comment: not smoking while using nicotine patch 03-26-12, 11/29/15 not using nicotene patch now  . Alcohol use 0.6 oz/week    1 Shots of liquor per week     Comment: 11/29/15 cocktail on weekends  . Drug use: No  . Sexual activity: Yes    Birth control/ protection: Post-menopausal   Other  Topics Concern  . Not on file   Social History Narrative   Lives with husband   Caffeine use- coffee 2 cups daily, sometimes tea    Family History  Problem Relation Age of Onset  . Ovarian cancer Mother 76  . Colon cancer Brother 59  . Breast cancer Paternal Aunt     6-7 with breast cancer; one bilateral, several premenopausal  . Pancreatitis Brother 69  . Colon cancer Paternal Aunt   . Cancer Cousin     NOS      Marti Sleigh, MD 11/06/2016, 9:44 AM

## 2016-11-06 NOTE — Patient Instructions (Signed)
Please call our office in [place] schedule a follow up appointment for February 2019.

## 2016-12-26 DIAGNOSIS — Z853 Personal history of malignant neoplasm of breast: Secondary | ICD-10-CM | POA: Diagnosis not present

## 2017-01-05 DIAGNOSIS — H5589 Other irregular eye movements: Secondary | ICD-10-CM | POA: Diagnosis not present

## 2017-01-05 DIAGNOSIS — I10 Essential (primary) hypertension: Secondary | ICD-10-CM | POA: Diagnosis not present

## 2017-01-05 DIAGNOSIS — R42 Dizziness and giddiness: Secondary | ICD-10-CM | POA: Diagnosis not present

## 2017-01-09 DIAGNOSIS — J302 Other seasonal allergic rhinitis: Secondary | ICD-10-CM | POA: Diagnosis not present

## 2017-01-09 DIAGNOSIS — R42 Dizziness and giddiness: Secondary | ICD-10-CM | POA: Diagnosis not present

## 2017-01-09 DIAGNOSIS — R030 Elevated blood-pressure reading, without diagnosis of hypertension: Secondary | ICD-10-CM | POA: Diagnosis not present

## 2017-01-15 ENCOUNTER — Other Ambulatory Visit: Payer: Self-pay

## 2017-01-15 DIAGNOSIS — C569 Malignant neoplasm of unspecified ovary: Secondary | ICD-10-CM

## 2017-01-15 DIAGNOSIS — C50311 Malignant neoplasm of lower-inner quadrant of right female breast: Secondary | ICD-10-CM

## 2017-01-16 ENCOUNTER — Other Ambulatory Visit (HOSPITAL_BASED_OUTPATIENT_CLINIC_OR_DEPARTMENT_OTHER): Payer: Medicare Other

## 2017-01-16 ENCOUNTER — Encounter: Payer: Self-pay | Admitting: Hematology and Oncology

## 2017-01-16 ENCOUNTER — Ambulatory Visit (HOSPITAL_BASED_OUTPATIENT_CLINIC_OR_DEPARTMENT_OTHER): Payer: Medicare Other | Admitting: Hematology and Oncology

## 2017-01-16 VITALS — BP 153/78 | HR 78 | Temp 98.2°F | Resp 20 | Ht 66.0 in | Wt 174.4 lb

## 2017-01-16 DIAGNOSIS — Z1509 Genetic susceptibility to other malignant neoplasm: Principal | ICD-10-CM

## 2017-01-16 DIAGNOSIS — C569 Malignant neoplasm of unspecified ovary: Secondary | ICD-10-CM

## 2017-01-16 DIAGNOSIS — Z170 Estrogen receptor positive status [ER+]: Secondary | ICD-10-CM

## 2017-01-16 DIAGNOSIS — Z8543 Personal history of malignant neoplasm of ovary: Secondary | ICD-10-CM

## 2017-01-16 DIAGNOSIS — C50311 Malignant neoplasm of lower-inner quadrant of right female breast: Secondary | ICD-10-CM

## 2017-01-16 DIAGNOSIS — Z79810 Long term (current) use of selective estrogen receptor modulators (SERMs): Secondary | ICD-10-CM

## 2017-01-16 DIAGNOSIS — Z1501 Genetic susceptibility to malignant neoplasm of breast: Secondary | ICD-10-CM

## 2017-01-16 LAB — COMPREHENSIVE METABOLIC PANEL
ALT: 19 U/L (ref 0–55)
AST: 23 U/L (ref 5–34)
Albumin: 3.4 g/dL — ABNORMAL LOW (ref 3.5–5.0)
Alkaline Phosphatase: 54 U/L (ref 40–150)
Anion Gap: 8 mEq/L (ref 3–11)
BUN: 14.6 mg/dL (ref 7.0–26.0)
CO2: 26 mEq/L (ref 22–29)
Calcium: 9.1 mg/dL (ref 8.4–10.4)
Chloride: 109 mEq/L (ref 98–109)
Creatinine: 0.9 mg/dL (ref 0.6–1.1)
EGFR: 71 mL/min/{1.73_m2} — ABNORMAL LOW (ref >90)
Glucose: 99 mg/dl (ref 70–140)
Potassium: 3.5 mEq/L (ref 3.5–5.1)
Sodium: 144 mEq/L (ref 136–145)
Total Bilirubin: 0.35 mg/dL (ref 0.20–1.20)
Total Protein: 7.4 g/dL (ref 6.4–8.3)

## 2017-01-16 LAB — CBC WITH DIFFERENTIAL/PLATELET
BASO%: 0.6 % (ref 0.0–2.0)
Basophils Absolute: 0 10*3/uL (ref 0.0–0.1)
EOS%: 1.2 % (ref 0.0–7.0)
Eosinophils Absolute: 0.1 10*3/uL (ref 0.0–0.5)
HCT: 37.7 % (ref 34.8–46.6)
HGB: 12.8 g/dL (ref 11.6–15.9)
LYMPH%: 26.3 % (ref 14.0–49.7)
MCH: 33.8 pg (ref 25.1–34.0)
MCHC: 34.1 g/dL (ref 31.5–36.0)
MCV: 99.2 fL (ref 79.5–101.0)
MONO#: 0.5 10*3/uL (ref 0.1–0.9)
MONO%: 8.3 % (ref 0.0–14.0)
NEUT#: 3.8 10*3/uL (ref 1.5–6.5)
NEUT%: 63.6 % (ref 38.4–76.8)
Platelets: 99 10*3/uL — ABNORMAL LOW (ref 145–400)
RBC: 3.8 10*6/uL (ref 3.70–5.45)
RDW: 12.9 % (ref 11.2–14.5)
WBC: 6 10*3/uL (ref 3.9–10.3)
lymph#: 1.6 10*3/uL (ref 0.9–3.3)

## 2017-01-16 MED ORDER — TAMOXIFEN CITRATE 20 MG PO TABS: 20.0000 mg | ORAL_TABLET | Freq: Every day | ORAL | 3 refills | Status: DC

## 2017-01-16 NOTE — Assessment & Plan Note (Signed)
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 BRCA 2 positive, however patient  Hx  left breast mutlifocal T1N0 ER PR + and HER 2 negative 11-2008: Continue tamoxifen  1. BRCA 2 mutation: Patient needs annual mammograms and MRIs 2. 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 3. mild thrombocytopenia x years, essentially stable, no bleeding, other counts ok. 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 She will call if bleeding or bruising.  Surveillance: 1. Bilateral breast MRI 01/26/2015: Right breast enhancing mass 8 mm which was DCIS patient needs annual breast MRIs 2. mammogram 12/21/2015 at [facility]: Benign 3. Breast exam 01/16/2017: Benign  Return to clinic in 1 year for follow-up

## 2017-01-16 NOTE — Assessment & Plan Note (Signed)
IIIC papillary serous ovarian ca diagnosed 03-2005. With BRCA 2 abnormality now documented, gyn oncology will follow yearly, follows with Dr Josephina Shih

## 2017-01-16 NOTE — Progress Notes (Signed)
Patient Care Team: [name], MD as PCP - General (Family Medicine) Erroll Luna, MD as Consulting Physician (General Surgery) Kyung Rudd, MD as Consulting Physician (Radiation Oncology) Gordy Levan, MD as Consulting Physician (Oncology) Sylvan Cheese, NP as Nurse Practitioner (Hematology and Oncology)  DIAGNOSIS:  Encounter Diagnoses  Name Primary?  . Malignant neoplasm of lower-inner quadrant of right breast of female, estrogen receptor positive ([place])   . Malignant neoplasm of ovary, unspecified laterality ([place])     SUMMARY OF ONCOLOGIC HISTORY: Oncology History   3/3/Multifocal T1N0 left breast at lumpectomy and sentinel node evaluation March 2010. ER PR +, Her2 negative. 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 Intolerant to Femara and Aromasin, Tamoxifen      08/31/2014 Procedure    Genetic testing: Mutation at [facility], [id number]      01/26/2015 Breast MRI    There is a 5 x 9 x 8 mm mass enhancement at the slight lower slight medial right breast anterior to middle depth with plateau enhancement kinetics.      02/09/2015 Initial Biopsy    Right breast core needle bx: negative for malignancy       02/24/2015 Procedure    Right breast core needle bx: DCIS with comedonecrosis, ER+ (95%), PR- (0%)      02/24/2015 Clinical Stage    Stage 0: Tis N0      04/07/2015 Definitive Surgery    Right lumpectomy: DCIS, intermediate grade, with necrosis; close lateral margin      04/07/2015  Pathologic Stage    Stage 0: pTis pNx      05/17/2015 - 06/14/2015 Radiation Therapy    Adjuvant RT([facility]): Right breast 42.5 Gy over 17 fractions; right breast boost 7.5 Gy over 3 fractions. Total dose: 50 Gy      09/02/2015 Survivorship    Survivorship care plan completed and mailed to patient in lieu of in person visit.       CHIEF COMPLIANT: Follow-up to establish oncology care with me  INTERVAL HISTORY: Monique Watson is a 76 year old lady with a BRCA2 mutation who was being followed by Dr. [name] for several years with a history of ovarian and breast cancers. She has had a recent mammogram which was normal. She is currently on tamoxifen therapy and appears to be tolerating it well. She denies any pain or discomfort in the breasts.  REVIEW OF SYSTEMS:   Constitutional: Denies fevers, chills or abnormal weight loss Eyes: Denies blurriness of vision Ears, nose, mouth, throat, and face: Denies mucositis or sore throat Respiratory: Denies cough, dyspnea or wheezes Cardiovascular: Denies palpitation, chest discomfort Gastrointestinal:  Denies nausea, heartburn or change in bowel habits Skin: Denies abnormal skin rashes Lymphatics: Denies new lymphadenopathy or easy bruising Neurological:Denies numbness, tingling or new weaknesses Behavioral/Psych: Mood is stable, no new changes  Extremities: No lower extremity edema Breast:  denies any pain or lumps or nodules in either breasts All other systems were reviewed with the  patient and are negative.  I have reviewed the past medical history, past surgical history, social history and family history with the patient and they are unchanged from previous note.  ALLERGIES:  is allergic to chlorhexidine; arimidex [anastrozole]; diovan [valsartan]; and uncoded nonscreenable allergen.  MEDICATIONS:  Current Outpatient Prescriptions  Medication Sig Dispense Refill  . albuterol (PROAIR HFA) 108 (90 Base) MCG/ACT inhaler Inhale 2 puffs into  the lungs every 6 (six) hours as needed for wheezing. 1 Inhaler 0  . aspirin 81 MG tablet Take 81 mg by mouth daily.    . Cholecalciferol (VITAMIN D3) 2000 UNITS TABS Take 1 capsule by mouth daily.     Marland Kitchen docusate sodium (COLACE) 100 MG capsule Take 100 mg by mouth daily.    . ferrous fumarate (HEMOCYTE - 106 MG FE) 325 (106 FE) MG TABS Take 1 tablet by mouth every 7 (seven) days. No certain day    . Omega-3 Fatty Acids (FISH OIL CONCENTRATE) 1000 MG CAPS Take 1,000 mg by mouth daily.     Marland Kitchen oxybutynin (DITROPAN-XL) 10 MG 24 hr tablet Take 10 mg by mouth daily.    . polyethylene glycol powder (GLYCOLAX/MIRALAX) powder Take 17 g by mouth daily as needed.    . Potassium 99 MG TABS Take 2 tablets by mouth daily.     . tamoxifen (NOLVADEX) 20 MG tablet Take 1 tablet (20 mg total) by mouth daily. 90 tablet 2  . vitamin E (VITAMIN E) 400 UNIT capsule Take 400 Units by mouth 2 (two) times daily.     No current facility-administered medications for this visit.     PHYSICAL EXAMINATION: ECOG PERFORMANCE STATUS: 1 - Symptomatic but completely ambulatory  Vitals:   01/16/17 1456  BP: (!) 153/78  Pulse: 78  Resp: 20  Temp: 98.2 F (36.8 C)   Filed Weights   01/16/17 1456  Weight: 174 lb 6.4 oz (79.1 kg)    GENERAL:alert, no distress and comfortable SKIN: skin color, texture, turgor are normal, no rashes or significant lesions EYES: normal, Conjunctiva are pink and non-injected, sclera clear OROPHARYNX:no exudate, no erythema and lips, buccal mucosa, and tongue normal  NECK: supple, thyroid normal size, non-tender, without nodularity LYMPH:  no palpable lymphadenopathy in the cervical, axillary or inguinal LUNGS: clear to auscultation and percussion with normal breathing effort HEART: regular rate & rhythm and no murmurs and no lower extremity edema ABDOMEN:abdomen soft, non-tender and normal bowel sounds MUSCULOSKELETAL:no cyanosis of digits and no clubbing  NEURO: alert & oriented x 3  with fluent speech, no focal motor/sensory deficits EXTREMITIES: No lower extremity edema BREAST: No palpable masses or nodules in either right or left breasts. No palpable axillary supraclavicular or infraclavicular adenopathy no breast tenderness or nipple discharge. (exam performed in the presence of a chaperone)  LABORATORY DATA:  I have reviewed the data as listed   Chemistry      Component Value Date/Time   NA 144 08/21/2016 0947   K 3.5 08/21/2016 0947   CL 108 04/06/2015 1020   CL 112 (H) 10/24/2012 1119   CO2 25 08/21/2016 0947   BUN 11.0 08/21/2016 0947   CREATININE 0.9 08/21/2016 0947      Component Value Date/Time   CALCIUM 8.6 08/21/2016 0947   ALKPHOS 61 08/21/2016 0947   AST 21 08/21/2016 0947   ALT 13 08/21/2016 0947   BILITOT 0.60 08/21/2016 0947       Lab Results  Component Value Date   WBC 6.0 01/16/2017  HGB 12.8 01/16/2017   HCT 37.7 01/16/2017   MCV 99.2 01/16/2017   PLT 99 (L) 01/16/2017   NEUTROABS 3.8 01/16/2017    ASSESSMENT & PLAN:  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 BRCA 2 positive, however patient  Hx  left breast mutlifocal T1N0 ER PR + and HER 2 negative 11-2008: Continue tamoxifen  1. BRCA 2 mutation: Patient needs annual mammograms and MRIs 2. 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 3. mild thrombocytopenia x years, essentially stable, no bleeding, other counts ok. 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 She will call if bleeding or bruising.  Surveillance: 1. Bilateral breast MRI 01/26/2015: Right breast enhancing mass 8 mm which was DCIS patient needs annual breast MRIs 2. mammogram [facility]/2016 at [facility]: Benign 3. Breast exam 01/16/2017: Benign  I set her up for a breast MRI in October. I will call her with the result of the MRI.  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 With BRCA 2 abnormality now documented, gyn oncology will follow yearly, follows with Dr Josephina Shih. We will get a CA-125 today.  Return to clinic in 1 year for follow-up  I spent 45 minutes talking to the patient of which more than half was spent in counseling and coordination of care. In addition I spent another 30 minutes reviewing the patient's chart and reviewing her entire history.  No orders of the defined types were placed in this encounter.  The patient has a good understanding of the overall plan. she agrees with it. she will call with any problems that may develop before the next visit here.   Rulon Eisenmenger, MD 01/16/17

## 2017-01-17 LAB — CA 125: Cancer Antigen (CA) 125: 21 U/mL (ref 0.0–38.1)

## 2017-02-07 DIAGNOSIS — N3281 Overactive bladder: Secondary | ICD-10-CM | POA: Diagnosis not present

## 2017-03-06 ENCOUNTER — Encounter (HOSPITAL_COMMUNITY): Payer: Self-pay | Admitting: Emergency Medicine

## 2017-03-06 DIAGNOSIS — Z5321 Procedure and treatment not carried out due to patient leaving prior to being seen by health care provider: Secondary | ICD-10-CM | POA: Insufficient documentation

## 2017-03-06 DIAGNOSIS — K6289 Other specified diseases of anus and rectum: Secondary | ICD-10-CM | POA: Insufficient documentation

## 2017-03-06 NOTE — ED Triage Notes (Addendum)
Pt reports small bowel movements today, last normal bowel movement at 1630 today. Pt states has the urge to go every 30 minutes, and BMs are painful. No blood in stools. Pt denies nausea/vomiting.

## 2017-03-07 ENCOUNTER — Emergency Department (HOSPITAL_COMMUNITY)
Admission: EM | Admit: 2017-03-07 | Discharge: 2017-03-07 | Disposition: A | Discharge status: Home / Self Care | Payer: Medicare Other | Attending: Emergency Medicine | Admitting: Emergency Medicine

## 2017-03-07 DIAGNOSIS — K6289 Other specified diseases of anus and rectum: Secondary | ICD-10-CM | POA: Diagnosis not present

## 2017-03-08 DIAGNOSIS — Z1211 Encounter for screening for malignant neoplasm of colon: Secondary | ICD-10-CM | POA: Diagnosis not present

## 2017-03-08 DIAGNOSIS — K5904 Chronic idiopathic constipation: Secondary | ICD-10-CM | POA: Diagnosis not present

## 2017-03-08 DIAGNOSIS — Z86010 Personal history of colonic polyps: Secondary | ICD-10-CM | POA: Diagnosis not present

## 2017-03-08 DIAGNOSIS — R194 Change in bowel habit: Secondary | ICD-10-CM | POA: Diagnosis not present

## 2017-03-08 DIAGNOSIS — K602 Anal fissure, unspecified: Secondary | ICD-10-CM | POA: Diagnosis not present

## 2017-03-16 DIAGNOSIS — E876 Hypokalemia: Secondary | ICD-10-CM | POA: Diagnosis not present

## 2017-03-26 DIAGNOSIS — E876 Hypokalemia: Secondary | ICD-10-CM | POA: Diagnosis not present

## 2017-04-12 DIAGNOSIS — E876 Hypokalemia: Secondary | ICD-10-CM | POA: Diagnosis not present

## 2017-07-04 DIAGNOSIS — D125 Benign neoplasm of sigmoid colon: Secondary | ICD-10-CM | POA: Diagnosis not present

## 2017-07-04 DIAGNOSIS — Z800 Family history of malignant neoplasm of digestive organs: Secondary | ICD-10-CM | POA: Diagnosis not present

## 2017-07-04 DIAGNOSIS — Z86010 Personal history of colonic polyps: Secondary | ICD-10-CM | POA: Diagnosis not present

## 2017-07-04 DIAGNOSIS — Z1211 Encounter for screening for malignant neoplasm of colon: Secondary | ICD-10-CM | POA: Diagnosis not present

## 2017-07-04 DIAGNOSIS — K635 Polyp of colon: Secondary | ICD-10-CM | POA: Diagnosis not present

## 2017-07-10 DIAGNOSIS — R05 Cough: Secondary | ICD-10-CM | POA: Diagnosis not present

## 2017-07-10 DIAGNOSIS — J189 Pneumonia, unspecified organism: Secondary | ICD-10-CM | POA: Diagnosis not present

## 2017-07-10 DIAGNOSIS — B349 Viral infection, unspecified: Secondary | ICD-10-CM | POA: Diagnosis not present

## 2017-07-13 DIAGNOSIS — J189 Pneumonia, unspecified organism: Secondary | ICD-10-CM | POA: Diagnosis not present

## 2017-08-01 DIAGNOSIS — J189 Pneumonia, unspecified organism: Secondary | ICD-10-CM | POA: Diagnosis not present

## 2017-08-01 DIAGNOSIS — Z23 Encounter for immunization: Secondary | ICD-10-CM | POA: Diagnosis not present

## 2017-08-07 ENCOUNTER — Other Ambulatory Visit: Payer: Self-pay | Admitting: Family Medicine

## 2017-08-07 DIAGNOSIS — R9389 Abnormal findings on diagnostic imaging of other specified body structures: Secondary | ICD-10-CM

## 2017-08-17 ENCOUNTER — Ambulatory Visit
Admission: RE | Admit: 2017-08-17 | Discharge: 2017-08-17 | Disposition: A | Discharge status: Home / Self Care | Payer: Medicare Other | Source: Ambulatory Visit | Attending: Family Medicine | Admitting: Family Medicine

## 2017-08-17 DIAGNOSIS — R918 Other nonspecific abnormal finding of lung field: Secondary | ICD-10-CM | POA: Diagnosis not present

## 2017-08-17 DIAGNOSIS — R9389 Abnormal findings on diagnostic imaging of other specified body structures: Secondary | ICD-10-CM

## 2017-10-01 DIAGNOSIS — R05 Cough: Secondary | ICD-10-CM | POA: Diagnosis not present

## 2017-10-01 DIAGNOSIS — J45909 Unspecified asthma, uncomplicated: Secondary | ICD-10-CM | POA: Diagnosis not present

## 2017-10-22 ENCOUNTER — Encounter: Payer: Self-pay | Admitting: Internal Medicine

## 2017-10-22 ENCOUNTER — Other Ambulatory Visit (INDEPENDENT_AMBULATORY_CARE_PROVIDER_SITE_OTHER): Payer: Medicare Other

## 2017-10-22 ENCOUNTER — Ambulatory Visit (INDEPENDENT_AMBULATORY_CARE_PROVIDER_SITE_OTHER): Payer: Medicare Other | Admitting: Internal Medicine

## 2017-10-22 ENCOUNTER — Ambulatory Visit (INDEPENDENT_AMBULATORY_CARE_PROVIDER_SITE_OTHER)
Admission: RE | Admit: 2017-10-22 | Discharge: 2017-10-22 | Disposition: A | Discharge status: Home / Self Care | Payer: Medicare Other | Source: Ambulatory Visit | Attending: Internal Medicine | Admitting: Internal Medicine

## 2017-10-22 VITALS — BP 140/74 | HR 65 | Ht 66.0 in | Wt 181.0 lb

## 2017-10-22 DIAGNOSIS — R05 Cough: Secondary | ICD-10-CM | POA: Diagnosis not present

## 2017-10-22 DIAGNOSIS — J479 Bronchiectasis, uncomplicated: Secondary | ICD-10-CM | POA: Insufficient documentation

## 2017-10-22 LAB — CBC WITH DIFFERENTIAL/PLATELET
Basophils Absolute: 0 10*3/uL (ref 0.0–0.1)
Basophils Relative: 0.6 % (ref 0.0–3.0)
Eosinophils Absolute: 0.1 10*3/uL (ref 0.0–0.7)
Eosinophils Relative: 1.2 % (ref 0.0–5.0)
HCT: 38.2 % (ref 36.0–46.0)
Hemoglobin: 13 g/dL (ref 12.0–15.0)
Lymphocytes Relative: 22.2 % (ref 12.0–46.0)
Lymphs Abs: 1.2 10*3/uL (ref 0.7–4.0)
MCHC: 33.9 g/dL (ref 30.0–36.0)
MCV: 98.3 fl (ref 78.0–100.0)
Monocytes Absolute: 0.4 10*3/uL (ref 0.1–1.0)
Monocytes Relative: 7.8 % (ref 3.0–12.0)
Neutro Abs: 3.6 10*3/uL (ref 1.4–7.7)
Neutrophils Relative %: 68.2 % (ref 43.0–77.0)
Platelets: 90 10*3/uL — ABNORMAL LOW (ref 150.0–400.0)
RBC: 3.89 Mil/uL (ref 3.87–5.11)
RDW: 13.3 % (ref 11.5–15.5)
WBC: 5.3 10*3/uL (ref 4.0–10.5)

## 2017-10-22 MED ORDER — FLUTTER DEVI: 0 refills | Status: DC

## 2017-10-22 NOTE — Patient Instructions (Addendum)
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 of your bronchial tubes which means that they don't function perfectly normally and mucus tends to pool in certain areas of your lung which can cause pneumonia and further scarring of your lung and bronchial tubes  Whenever you develop cough congestion take mucinex or mucinex dm > these will help keep the mucus loose and flowing but if your condition worsens you need to seek help immediately preferably here or somewhere inside the [facility] system to compare xrays ( worse = darker or bloody mucus or pain on breathing in)    Work on inhaler technique:  relax and gently blow all the way out then take a nice smooth deep breath back in, triggering the inhaler at same time you start breathing in.  Hold for up to 5 seconds if you can.   Use the flutter valve as much as you can to help you bring up mucus     Please remember to go to the lab and x-ray department downstairs in the basement  for your tests - we will call you with the results when they are available.      Please schedule a follow up visit in 3 months but call sooner if needed with full pfts on return  - bring all active medications to all office visits so we can keep up with exactly what you are taking

## 2017-10-22 NOTE — Progress Notes (Signed)
Subjective:     Patient ID: Monique Watson, female   DOB: 1941/04/08,    MRN: [id number]  HPI   71 yowf quit smoking 11/ 2018 referred to pulmonary clinic 10/22/2017 by Dr   Drema Dallas for eval of bronchiectasis.    10/22/2017 1st [facility] office visit/ Monique Watson   Chief Complaint  Patient presents with  . Pulmonary Consult    Referred by Dr. [name]. Pt states had PNA back in Nov 2018. She is coughing with yellow sputum. She uses her albuterol inhaler at least once per day on average.   baseline pna "every other year"  x 10-15 years and fine in between so fine in fact that she could ex at gym and no need for inhalers Has not returned  to gym p most recent dx of pna in Nov 2018  Uses saba each helps bring up  Maybe a tbsp beige more than yellow and never bloody / no better with a zpak Sleeps flat ok  Doe = MMRC1 = can walk nl pace, flat grade, can't hurry or go uphills or steps s sob     No obvious day to day or daytime variability or assoc  mucus plugs or hemoptysis or cp or chest tightness, subjective wheeze or overt sinus or hb symptoms. No unusual exposure hx or h/o childhood pna/ asthma or knowledge of premature birth.  Sleeping ok flat without nocturnal  or early am exacerbation  of respiratory  c/o's or need for noct saba. Also denies any obvious fluctuation of symptoms with weather or environmental changes or other aggravating or alleviating factors except as outlined above   Current Allergies, Complete Past Medical History, Past Surgical History, Family History, and Social History were reviewed in [employer] record.  ROS  The following are not active complaints unless bolded Hoarseness, sore throat, dysphagia, dental problems, itching, sneezing,  nasal congestion or discharge of excess mucus or purulent secretions, ear ache,  fever, chills, sweats, unintended wt loss or wt gain, classically pleuritic or exertional cp,  orthopnea pnd or leg swelling,  presyncope, palpitations, abdominal pain, anorexia, nausea, vomiting, diarrhea  or change in bowel habits or change in bladder habits, change in stools or change in urine, dysuria, hematuria,  rash, arthralgias, visual complaints, headache, numbness, weakness or ataxia or problems with walking or coordination,  change in mood/affect or memory.        Current Meds  Medication Sig  . albuterol (PROAIR HFA) 108 (90 Base) MCG/ACT inhaler Inhale 2 puffs into the lungs every 6 (six) hours as needed for wheezing.  Marland Kitchen aspirin 81 MG tablet Take 81 mg by mouth daily.  . Cholecalciferol (VITAMIN D3) 2000 UNITS TABS Take 1 capsule by mouth daily.   . Omega-3 Fatty Acids (FISH OIL CONCENTRATE) 1000 MG CAPS Take 1,000 mg by mouth daily.   Marland Kitchen oxybutynin (DITROPAN-XL) 10 MG 24 hr tablet Take 10 mg by mouth daily.  . Polyethylene Glycol 3350 (PEG 3350) POWD Take 17 g by mouth daily.  . potassium chloride (K-DUR,KLOR-CON) 10 MEQ tablet Take 10 mEq by mouth daily.  . vitamin E (VITAMIN E) 400 UNIT capsule Take 400 Units by mouth 2 (two) times daily.           Review of Systems     Objective:   Physical Exam      amb wf nad   Wt Readings from Last 3 Encounters:  10/22/17 181 lb (82.1 kg)  03/06/17 169 lb (76.7  kg)  01/16/17 174 lb 6.4 oz (79.1 kg)     Vital signs reviewed - Note on arrival 02 sats  97% on RA      HEENT: nl   turbinates bilaterally, and oropharynx. Nl external ear canals without cough reflex - top denture    NECK :  without JVD/Nodes/TM/ nl carotid upstrokes bilaterally   LUNGS: no acc muscle use,  Nl contour chest with mild moderate exp> insp  rhonchi bilaterally    CV:  RRR  no s3 or murmur or increase in P2, and no edema   ABD:  soft and nontender with nl inspiratory excursion in the supine position. No bruits or organomegaly appreciated, bowel sounds nl  MS:  Nl gait/ ext warm without deformities, calf tenderness, cyanosis or clubbing No obvious joint restrictions    SKIN: warm and dry without lesions    NEURO:  alert, approp, nl sensorium with  no motor or cerebellar deficits apparent.     I personally reviewed images and agree with radiology impression as follows:   Chest CT w/o contrast  08/17/17  1. Multifocal bronchiectasis and tree-in-bud opacities as described. 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 No consolidating pneumonia.  CXR PA and Lateral:   10/22/2017 :    I personally reviewed images and agree with radiology impression as follows:    No active cardiopulmonary disease.   Labs ordered/ reviewed:      Chemistry      Component Value Date/Time   NA 144 01/16/2017 1430   K 3.5 01/16/2017 1430   CL 108 04/06/2015 1020   CL 112 (H) 10/24/2012 1119   CO2 26 01/16/2017 1430   BUN 14.6 01/16/2017 1430   CREATININE 0.9 01/16/2017 1430      Component Value Date/Time   CALCIUM 9.1 01/16/2017 1430   ALKPHOS 54 01/16/2017 1430   AST 23 01/16/2017 1430   ALT 19 01/16/2017 1430   BILITOT 0.35 01/16/2017 1430        Lab Results  Component Value Date   WBC 5.3 10/22/2017   HGB 13.0 10/22/2017   HCT 38.2 10/22/2017   MCV 98.3 10/22/2017   PLT 90.0 (L) 10/22/2017       EOS                       0.1                                                                    10/22/2017      Labs ordered 10/22/2017      Quant Ig's/ Quantiferon TB          Assessment:

## 2017-10-23 ENCOUNTER — Encounter: Payer: Self-pay | Admitting: Internal Medicine

## 2017-10-23 NOTE — Assessment & Plan Note (Signed)
See CT chest 08/17/17  - Spirometry 10/22/2017  wnl s am saba but active exp  rhonchi on exam  - Quant Ig's wnl  - Allergy profile 10/22/2017 >  Eos 0.1 /  IgE   - Quant TB 10/22/2017    This is an extremely common benign condition in the elderly and does not warrant aggressive eval/ rx at this point unless there is a clinical correlation suggesting unaddressed pulmonary infection (purulent sputum, night sweats, unintended wt loss, doe) or evolution of  obvious changes on plain cxr (as opposed to serial CT, which is way over sensitive to make clinical decisions re intervention and treatment in the elderly, who tend to tolerate both dx and treatment poorly) .   Clinically she has significant residual obstruction on exam and would probably benefit from more consistent bronchodilators but for now will see how she does with more conservative measures as outlined in AVS including   1) optimal use of hfa 2) Flutter valve 3) mucinex products 4) discussion of pathophysiology including using the escalator analogy  5) return for full pfts in 3 m   Total time devoted to counseling  > 50 % of initial 60 min office visit:  review case with pt/son discussion of options/alternatives/ personally creating written customized instructions  in presence of pt  then going over those specific  Instructions directly with the pt including how to use all of the meds but in particular covering each new medication in detail and the difference between the maintenance= "automatic" meds and the prns using an action plan format for the latter (If this problem/symptom => do that organization reading Left to right).  Please see AVS from this visit for a full list of these instructions which I personally wrote for this pt and  are unique to this visit.

## 2017-10-23 NOTE — Progress Notes (Signed)
LMTCB

## 2017-10-24 LAB — RESPIRATORY ALLERGY PROFILE REGION II ~~LOC~~

## 2017-10-24 LAB — QUANTIFERON-TB GOLD PLUS
Mitogen-NIL: 7.36 IU/mL
NIL: 0.04 IU/mL
QuantiFERON-TB Gold Plus: NEGATIVE
TB1-NIL: 0.05 IU/mL
TB2-NIL: 0.01 IU/mL

## 2017-10-24 LAB — IGG, IGA, IGM
IgG (Immunoglobin G), Serum: 2061 mg/dL — ABNORMAL HIGH (ref 694–1618)
IgM, Serum: 93 mg/dL (ref 48–271)
Immunoglobulin A: 286 mg/dL (ref 81–463)

## 2017-10-24 LAB — INTERPRETATION:

## 2017-10-24 NOTE — Progress Notes (Signed)
Spoke with pt and notified of results per Dr. Wert. Pt verbalized understanding and denied any questions. 

## 2017-12-16 IMAGING — CT CT CHEST W/O CM
3 of 4 series · 17 of 30 positions shown, 19 images · non-contrast
Comparison: 03/14/2005

CLINICAL DATA: Abnormal chest x-ray. Productive cough. Pneumonia
with no resolution on follow-up chest x-ray.

EXAM:
CT CHEST WITHOUT CONTRAST
TECHNIQUE: Multidetector CT imaging of the chest was performed following the
standard protocol without IV contrast.

[Series 3: chest w/o · axial · non-contrast · 0.70mm/px · z∈[-191,+16]mm · 6 of 117 slices shown]
[im 17/117  lung]
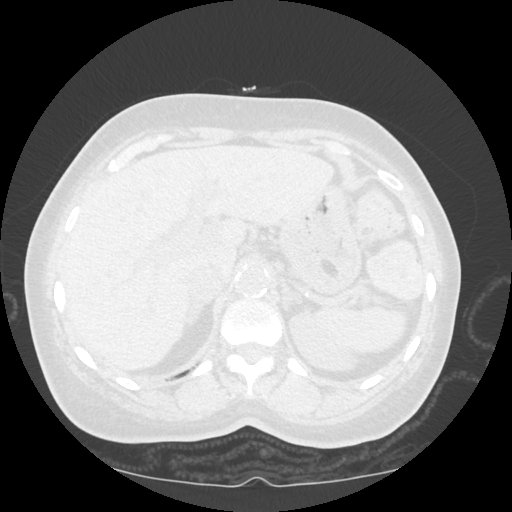
[im 34/117  lung]
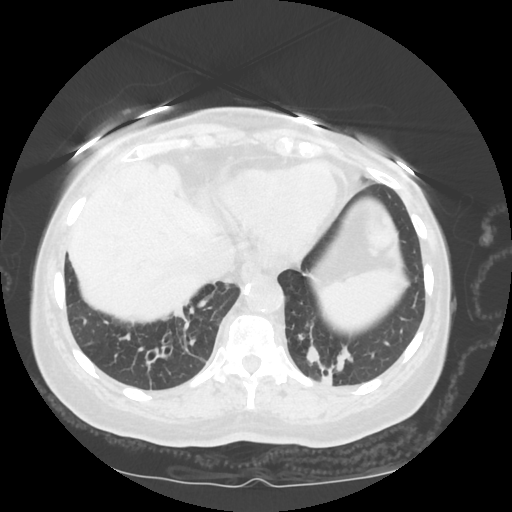
[im 50/117  lung]
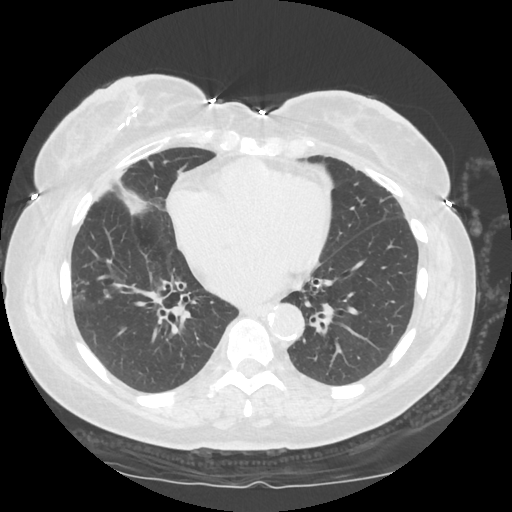
[im 67/117  lung]
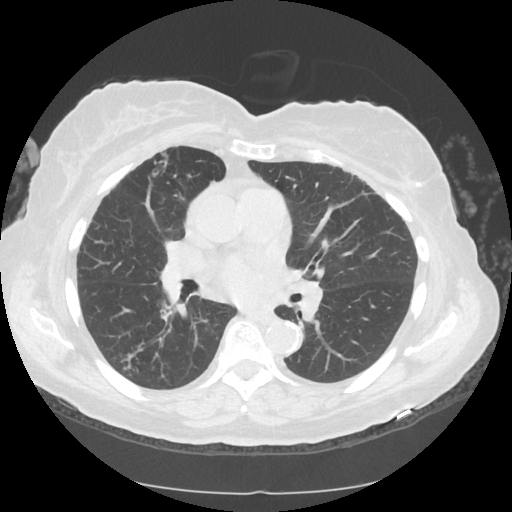
[im 83/117  lung]
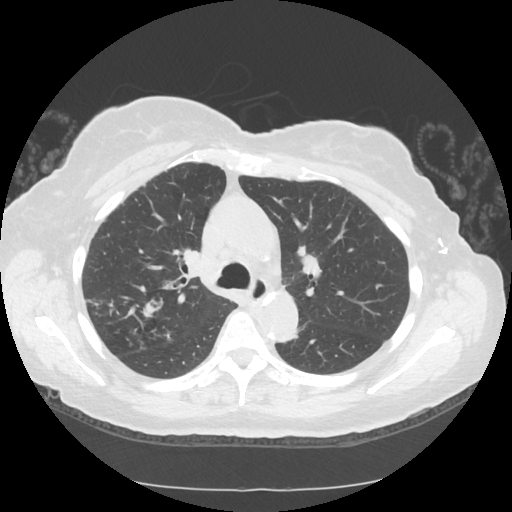
[im 100/117  lung]
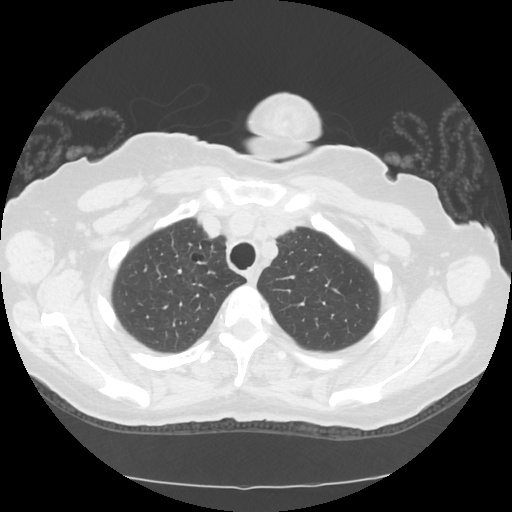

[Series 4: lung windows · axial · 0.70mm/px · z∈[-196,+22]mm · 7 of 117 slices shown, 9 images]
[im 15/117  mediastinal]
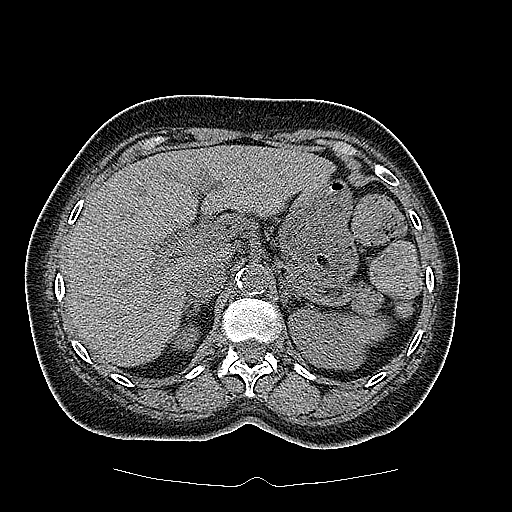
[im 15/117  lung]
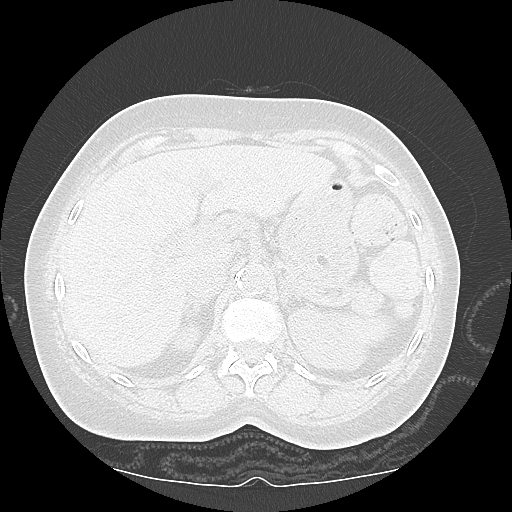
[im 30/117  lung]
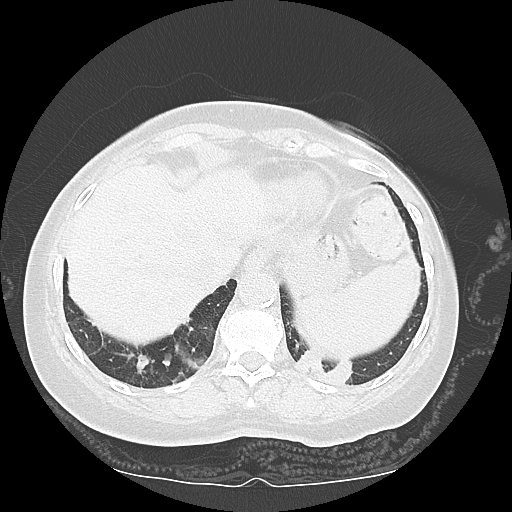
[im 44/117  lung]
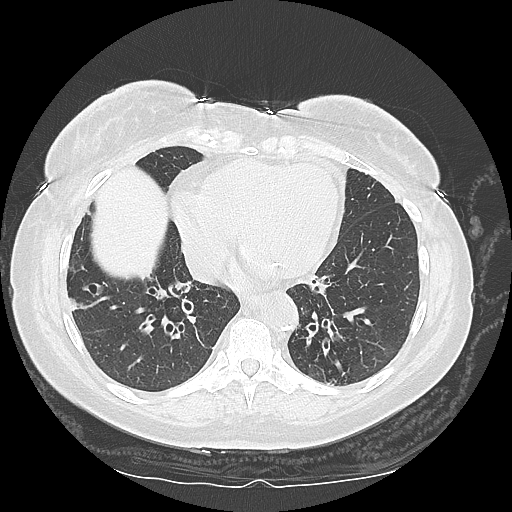
[im 59/117  lung]
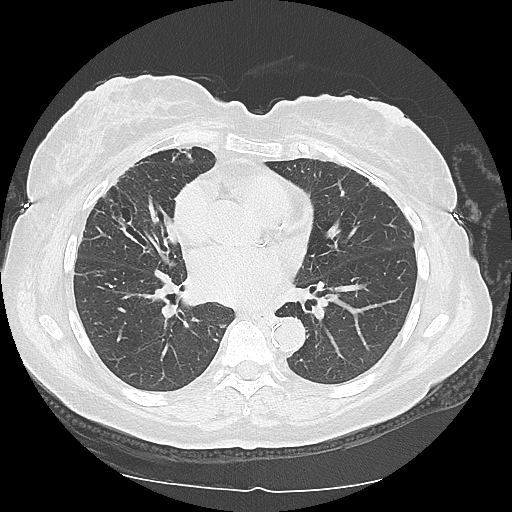
[im 73/117  mediastinal]
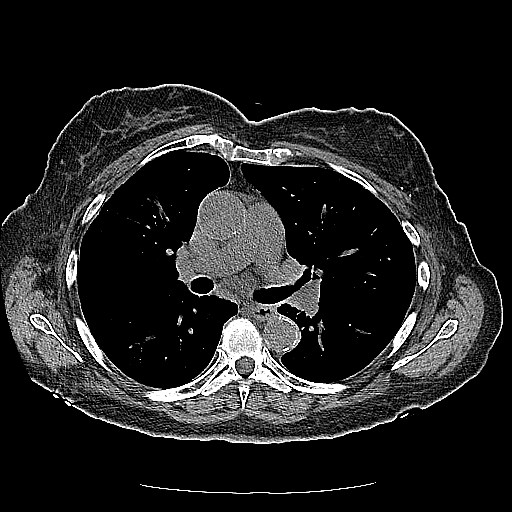
[im 73/117  lung]
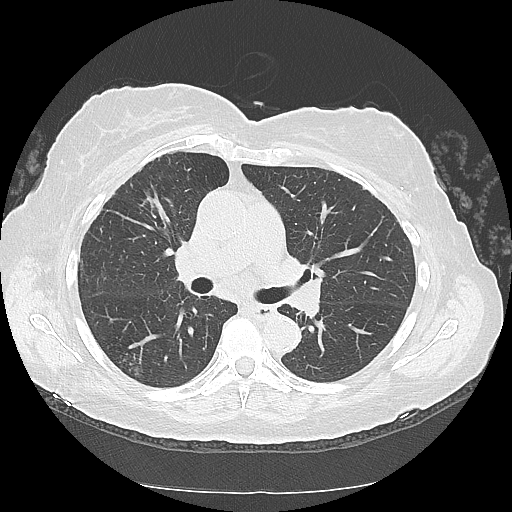
[im 88/117  lung]
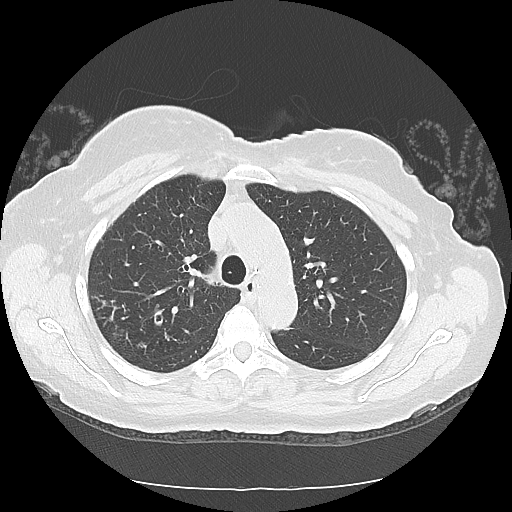
[im 102/117  lung]
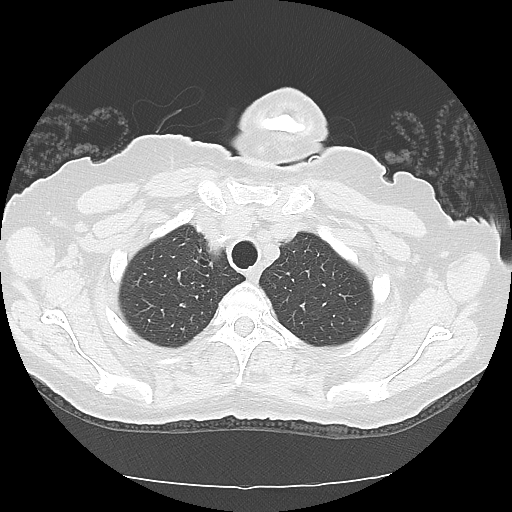

[Series 602: sagittal body · sagittal · 0.70mm/px · 4 of 145 slices shown]
[im 15/145  mediastinal]
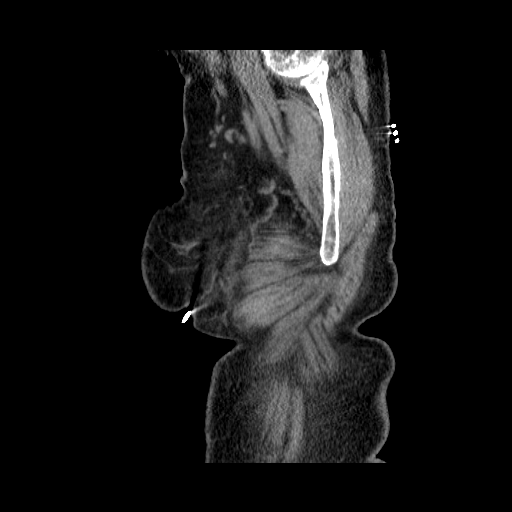
[im 29/145  mediastinal]
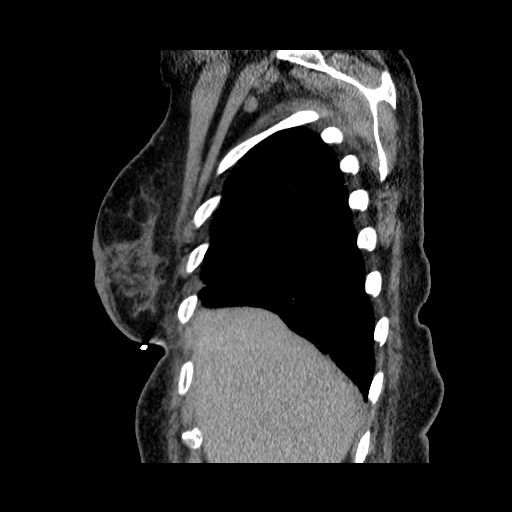
[im 44/145  mediastinal]
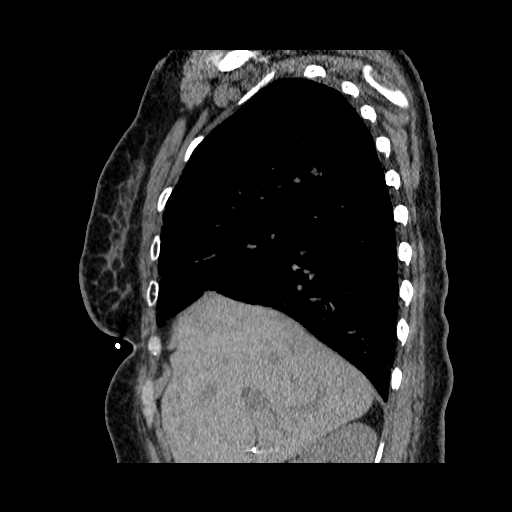
[im 58/145  mediastinal]
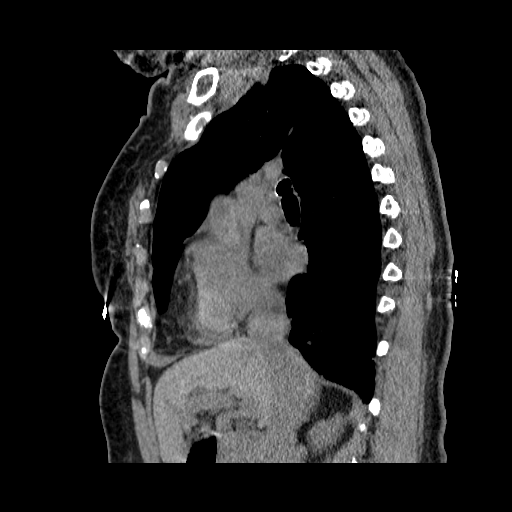

[17 of 30 positions shown; findings below may reference images not displayed]

FINDINGS: Cardiovascular: Normal heart size. Trace anterior pericardial fluid.
Diffuse atherosclerotic calcification of the aorta.

Mediastinum/Nodes: Negative for adenopathy or mass.

Lungs/Pleura: Cylindrical, varicose, an even right upper lobe cystic
bronchiectasis. The affected airways show thick wall. Affected lung
primarily involves the posterior segment right upper lobe, anterior
segment right upper lobe, medial and lateral right middle lobe
segments, and right more than left lower lobe basal segments. There
is clustered tree-in-bud opacities in the posterior right upper lobe
and apical right lower lobe. No consolidating pneumonia. No edema,
effusion, or pneumothorax. Bronchiectasis is new/progressed since
prior. No effusion, edema, or masslike finding.

Upper Abdomen: Cholecystectomy clips.

Musculoskeletal: No acute or aggressive finding.

Other:  Lumpectomy changes in the bilateral breasts.
IMPRESSION: 1. Multifocal bronchiectasis and tree-in-bud opacities as described.
Chronic chronic nontuberculous mycobacterium infection should
specifically be considered. No consolidating pneumonia.
2. Aortic Atherosclerosis ([id number]).

## 2018-01-01 DIAGNOSIS — R928 Other abnormal and inconclusive findings on diagnostic imaging of breast: Secondary | ICD-10-CM | POA: Diagnosis not present

## 2018-01-01 DIAGNOSIS — Z853 Personal history of malignant neoplasm of breast: Secondary | ICD-10-CM | POA: Diagnosis not present

## 2018-01-02 ENCOUNTER — Ambulatory Visit (INDEPENDENT_AMBULATORY_CARE_PROVIDER_SITE_OTHER): Payer: Medicare Other

## 2018-01-02 ENCOUNTER — Other Ambulatory Visit: Payer: Self-pay

## 2018-01-02 ENCOUNTER — Encounter (HOSPITAL_COMMUNITY): Payer: Self-pay | Admitting: Emergency Medicine

## 2018-01-02 ENCOUNTER — Ambulatory Visit (HOSPITAL_COMMUNITY)
Admission: EM | Admit: 2018-01-02 | Discharge: 2018-01-02 | Disposition: A | Discharge status: Home / Self Care | Payer: Medicare Other | Attending: Family Medicine | Admitting: Family Medicine

## 2018-01-02 DIAGNOSIS — J471 Bronchiectasis with (acute) exacerbation: Secondary | ICD-10-CM

## 2018-01-02 DIAGNOSIS — R079 Chest pain, unspecified: Secondary | ICD-10-CM | POA: Diagnosis not present

## 2018-01-02 MED ORDER — PREDNISONE 20 MG PO TABS: ORAL_TABLET | ORAL | 0 refills | Status: DC

## 2018-01-02 MED ORDER — LEVOFLOXACIN 500 MG PO TABS: 500.0000 mg | ORAL_TABLET | Freq: Every day | ORAL | 0 refills | Status: DC

## 2018-01-02 MED ORDER — IPRATROPIUM-ALBUTEROL 0.5-2.5 (3) MG/3ML IN SOLN
RESPIRATORY_TRACT | Status: AC
  Filled 2018-01-02: qty 3

## 2018-01-02 NOTE — Discharge Instructions (Addendum)
Continue use of your inhaler every 6 hours.  Follow up with your pulmonary doctor next week.

## 2018-01-02 NOTE — ED Provider Notes (Signed)
[place]   [id number] 01/02/18 Arrival Time: 1507   SUBJECTIVE:  Monique Watson is a 77 y.o. female who presents to the urgent care with complaint of shortness of breath for 2 days.  She is becoming fatigued.  She has a h/o pneumonia from 2 months ago and has seen Dr. Melvyn Novas, pulmonologist, who prescribed albuterol but this has not helped this time.  No chest pain, nausea, diaphoresis, vomiting.  No smoking hx.  No leg pains or swelling     Past Medical History:  Diagnosis Date  . Anemia   . Arthritis   . Breast cancer ([place]) 09/29/2011  . Hypertension   . Ovarian cancer ([place]) 09/29/2011  . Pneumonia   . Radiation 01/14/2009-02/10/2009   left breast 45 Gy, boosted to 5 Gy   Family History  Problem Relation Age of Onset  . Ovarian cancer Mother 52  . Colon cancer Brother 57  . Breast cancer Paternal Aunt        6-7 with breast cancer; one bilateral, several premenopausal  . Pancreatitis Brother 57  . Colon cancer Paternal Aunt   . Cancer Cousin        NOS   Social History   Socioeconomic History  . Marital status: Married    Spouse name: Juanda Crumble  . Number of children: 2  . Years of education: 34  . Highest education level: Not on file  Occupational History    Comment: retired  Scientific laboratory technician  . Financial resource strain: Not on file  . Food insecurity:    Worry: Not on file    Inability: Not on file  . Transportation needs:    Medical: Not on file    Non-medical: Not on file  Tobacco Use  . Smoking status: Former Smoker    Packs/day: 1.00    Years: 58.00    Pack years: 58.00    Types: Cigarettes    Last attempt to quit: 07/19/2017    Years since quitting: 0.4  . Smokeless tobacco: Never Used  Substance and Sexual Activity  . Alcohol use: Yes    Alcohol/week: 0.6 oz    Types: 1 Shots of liquor per week    Comment: 11/29/15 cocktail on weekends  . Drug use: No  . Sexual activity: Yes    Birth control/protection: Post-menopausal  Lifestyle    . Physical activity:    Days per week: Not on file    Minutes per session: Not on file  . Stress: Not on file  Relationships  . Social connections:    Talks on phone: Not on file    Gets together: Not on file    Attends religious service: Not on file    Active member of club or organization: Not on file    Attends meetings of clubs or organizations: Not on file    Relationship status: Not on file  . Intimate partner violence:    Fear of current or ex partner: Not on file    Emotionally abused: Not on file    Physically abused: Not on file    Forced sexual activity: Not on file  Other Topics Concern  . Not on file  Social History Narrative   Lives with husband   Caffeine use- coffee 2 cups daily, sometimes tea   No outpatient medications have been marked as taking for the 01/02/18 encounter [facility] Encounter).   Allergies  Allergen Reactions  . Chlorhexidine Anaphylaxis, Hives, Shortness Of Breath and Itching  anaphylaxis  . Arimidex [Anastrozole] Other (See Comments)    Body aches per patient. Irven Baltimore, RN.  Marland Kitchen Diovan [Valsartan] Other (See Comments)    arthralgias  . Uncoded Nonscreenable Allergen     Blood pressure medication patient stated that she has a low tolerance for      ROS: As per HPI, remainder of ROS negative.   OBJECTIVE:   Vitals:   01/02/18 1511  BP: (!) 177/79  Pulse: 79  Resp: (!) 32  Temp: 99.5 F (37.5 C)  TempSrc: Oral  SpO2: 100%     General appearance: alert; moderately dyspneic Eyes: PERRL; EOMI; conjunctiva normal HENT: normocephalic; atraumatic;  oral mucosa normal Neck: supple Lungs: basilar rales auscultation bilaterally, few bilateral expiratory wheezes. Heart: regular rate and rhythm Abdomen: soft, non-tender; bowel sounds normal; no masses or organomegaly; no guarding or rebound tenderness Back: no CVA tenderness Extremities: no cyanosis or edema; symmetrical with no gross deformities Skin: warm and  dry Neurologic: normal gait; grossly normal Psychological: alert and cooperative; normal mood and affect      Labs:  Results for orders placed or performed in visit on 10/22/17  IgG, IgA, IgM  Result Value Ref Range   Immunoglobulin A 286 81 - 463 mg/dL   IgG (Immunoglobin G), Serum 2,061 (H) 694 - 1,618 mg/dL   IgM, Serum 93 48 - 271 mg/dL  QuantiFERON-TB Gold Plus  Result Value Ref Range   QuantiFERON-TB Gold Plus NEGATIVE NEGATIVE   NIL 0.04 IU/mL   Mitogen-NIL 7.36 IU/mL   TB1-NIL 0.05 IU/mL   TB2-NIL 0.01 IU/mL  Resp Allergy Profile Regn2DC DE MD [place] [facility]  Result Value Ref Range   Allergen, D pternoyssinus,d7 <0.10 kU/L   Class 0    D. farinae <0.10 kU/L   Class 0    Allergen, P. notatum, m1 <0.10 kU/L   Class 0    CLADOSPORIUM HERBARUM (M2) IGE <0.10 kU/L   Class 0    Aspergillus fumigatus, m3 <0.10 kU/L   Class 0    Allergen, A. alternata, m6 <0.10 kU/L   Class 0    Cat Dander <0.10 kU/L   Class 0    Dog Dander <0.10 kU/L   Class 0    Cockroach <0.10 kU/L   Class 0    Box Elder IgE <0.10 kU/L   Class 0    Allergen, Comm Silver Wendee Copp, t9 <0.10 kU/L   Class 0    Allergen, Cedar tree, t12 <0.10 kU/L   Class 0    Allergen, Cottonwood, t14 <0.10 kU/L   Class 0    Allergen, Oak,t7 <0.10 kU/L   Class 0    Elm IgE <0.10 kU/L   Class 0    Pecan/Hickory Tree IgE <0.10 kU/L   Class 0    Allergen, Mulberry, t76 <0.10 kU/L   Class 0    [place] Grass <0.10 kU/L   Class 0    Timothy Grass <0.10 kU/L   Class 0    Johnson Grass <0.10 kU/L   Class 0    COMMON RAGWEED (SHORT) (W1) IGE <0.10 kU/L   Class 0    Rough Pigweed  IgE <0.10 kU/L   Class 0    Sheep Sorrel IgE <0.10 kU/L   Class 0    Allergen, Mouse Urine Protein, e78 <0.10 kU/L   Class 0    IgE (Immunoglobulin E), Serum 45 <OR=114 kU/L  CBC with Differential/Platelet  Result Value Ref Range   WBC 5.3 4.0 - 10.5  K/uL   RBC 3.89 3.87 - 5.11 Mil/uL   Hemoglobin 13.0 12.0 - 15.0 g/dL   HCT 38.2 36.0  - 46.0 %   MCV 98.3 78.0 - 100.0 fl   MCHC 33.9 30.0 - 36.0 g/dL   RDW 13.3 11.5 - 15.5 %   Platelets 90.0 (L) 150.0 - 400.0 K/uL   Neutrophils Relative % 68.2 43.0 - 77.0 %   Lymphocytes Relative 22.2 12.0 - 46.0 %   Monocytes Relative 7.8 3.0 - 12.0 %   Eosinophils Relative 1.2 0.0 - 5.0 %   Basophils Relative 0.6 0.0 - 3.0 %   Neutro Abs 3.6 1.4 - 7.7 K/uL   Lymphs Abs 1.2 0.7 - 4.0 K/uL   Monocytes Absolute 0.4 0.1 - 1.0 K/uL   Eosinophils Absolute 0.1 0.0 - 0.7 K/uL   Basophils Absolute 0.0 0.0 - 0.1 K/uL  Interpretation:  Result Value Ref Range   Interpretation      Labs Reviewed - No data to display  No results found.     ASSESSMENT & PLAN:  1. Bronchiectasis with (acute) exacerbation (HCC)   Continue use of your inhaler every 6 hours.  Follow up with your pulmonary doctor next week.  Meds ordered this encounter  Medications  . predniSONE (DELTASONE) 20 MG tablet    Sig: Two daily with food    Dispense:  10 tablet    Refill:  0  . levofloxacin (LEVAQUIN) 500 MG tablet    Sig: Take 1 tablet (500 mg total) by mouth daily.    Dispense:  7 tablet    Refill:  0    Reviewed expectations re: course of current medical issues. Questions answered. Outlined signs and symptoms indicating need for more acute intervention. Patient verbalized understanding. After Visit Summary given.    Procedures:      Robyn Haber, MD 01/02/18 1624

## 2018-01-02 NOTE — ED Triage Notes (Signed)
Patient has had cols symptoms for 2 days.  Patient has had stuffy nose, cough with phlegm, patient has sob.  Speaking in complete sentences.  Patient is sob activity or multiple sentences.  Patient feels bad and feels very sore in torso.  Patient says she feels like she did with pneumonia.

## 2018-01-02 NOTE — ED Notes (Signed)
Notified dr Joseph Art

## 2018-01-16 ENCOUNTER — Inpatient Hospital Stay: Payer: Medicare Other

## 2018-01-16 ENCOUNTER — Inpatient Hospital Stay: Payer: Medicare Other | Attending: Hematology and Oncology | Admitting: Hematology and Oncology

## 2018-01-16 ENCOUNTER — Telehealth: Payer: Self-pay | Admitting: Hematology and Oncology

## 2018-01-16 DIAGNOSIS — C50311 Malignant neoplasm of lower-inner quadrant of right female breast: Secondary | ICD-10-CM

## 2018-01-16 DIAGNOSIS — C569 Malignant neoplasm of unspecified ovary: Secondary | ICD-10-CM

## 2018-01-16 DIAGNOSIS — Z79810 Long term (current) use of selective estrogen receptor modulators (SERMs): Secondary | ICD-10-CM | POA: Diagnosis not present

## 2018-01-16 DIAGNOSIS — Z1509 Genetic susceptibility to other malignant neoplasm: Secondary | ICD-10-CM

## 2018-01-16 DIAGNOSIS — Z923 Personal history of irradiation: Secondary | ICD-10-CM | POA: Diagnosis not present

## 2018-01-16 DIAGNOSIS — Z1501 Genetic susceptibility to malignant neoplasm of breast: Secondary | ICD-10-CM

## 2018-01-16 DIAGNOSIS — Z1231 Encounter for screening mammogram for malignant neoplasm of breast: Secondary | ICD-10-CM | POA: Diagnosis not present

## 2018-01-16 DIAGNOSIS — Z170 Estrogen receptor positive status [ER+]: Secondary | ICD-10-CM

## 2018-01-16 LAB — CBC WITH DIFFERENTIAL/PLATELET
Basophils Absolute: 0 10*3/uL (ref 0.0–0.1)
Basophils Relative: 1 %
Eosinophils Absolute: 0.1 10*3/uL (ref 0.0–0.5)
Eosinophils Relative: 1 %
HCT: 36.5 % (ref 34.8–46.6)
Hemoglobin: 12.4 g/dL (ref 11.6–15.9)
Lymphocytes Relative: 26 %
Lymphs Abs: 1.4 10*3/uL (ref 0.9–3.3)
MCH: 33.5 pg (ref 25.1–34.0)
MCHC: 34 g/dL (ref 31.5–36.0)
MCV: 98.6 fL (ref 79.5–101.0)
Monocytes Absolute: 0.5 10*3/uL (ref 0.1–0.9)
Monocytes Relative: 8 %
Neutro Abs: 3.5 10*3/uL (ref 1.5–6.5)
Neutrophils Relative %: 64 %
Platelets: 101 10*3/uL — ABNORMAL LOW (ref 145–400)
RBC: 3.7 MIL/uL (ref 3.70–5.45)
RDW: 13 % (ref 11.2–14.5)
WBC: 5.5 10*3/uL (ref 3.9–10.3)

## 2018-01-16 LAB — COMPREHENSIVE METABOLIC PANEL
ALT: 20 U/L (ref 0–55)
AST: 19 U/L (ref 5–34)
Albumin: 3.1 g/dL — ABNORMAL LOW (ref 3.5–5.0)
Alkaline Phosphatase: 63 U/L (ref 40–150)
Anion gap: 5 (ref 3–11)
BUN: 11 mg/dL (ref 7–26)
CO2: 27 mmol/L (ref 22–29)
Calcium: 9 mg/dL (ref 8.4–10.4)
Chloride: 110 mmol/L — ABNORMAL HIGH (ref 98–109)
Creatinine, Ser: 1.07 mg/dL (ref 0.60–1.10)
GFR calc Af Amer: 56 mL/min — ABNORMAL LOW (ref >60)
GFR calc non Af Amer: 48 mL/min — ABNORMAL LOW (ref >60)
Glucose, Bld: 92 mg/dL (ref 70–140)
Potassium: 3.8 mmol/L (ref 3.5–5.1)
Sodium: 142 mmol/L (ref 136–145)
Total Bilirubin: 0.6 mg/dL (ref 0.2–1.2)
Total Protein: 6.9 g/dL (ref 6.4–8.3)

## 2018-01-16 MED ORDER — TAMOXIFEN CITRATE 20 MG PO TABS: 20.0000 mg | ORAL_TABLET | Freq: Every day | ORAL | 3 refills | Status: DC

## 2018-01-16 NOTE — Progress Notes (Signed)
Patient Care Team: [name], MD as PCP - General (Family Medicine) Erroll Luna, MD as Consulting Physician (General Surgery) Kyung Rudd, MD as Consulting Physician (Radiation Oncology) Gordy Levan, MD as Consulting Physician (Oncology) Sylvan Cheese, NP as Nurse Practitioner (Hematology and Oncology)  DIAGNOSIS:  Encounter Diagnoses  Name Primary?  . Encounter for screening mammogram for malignant neoplasm of breast   . Malignant neoplasm of lower-inner quadrant of right breast of female, estrogen receptor positive ([place])     SUMMARY OF ONCOLOGIC HISTORY: Oncology History   3/3/Multifocal T1N0 left breast at lumpectomy and sentinel node evaluation March 2010. ER PR +, Her2 negative. 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 Intolerant to Femara and Aromasin, Tamoxifen      08/31/2014 Procedure    Genetic testing: Mutation at [facility], [id number]      01/26/2015 Breast MRI    There is a 5 x 9 x 8 mm mass enhancement at the slight lower slight medial right breast anterior to middle depth with plateau enhancement kinetics.      02/09/2015 Initial Biopsy    Right breast core needle bx: negative for malignancy       02/24/2015 Procedure    Right breast core needle bx: DCIS with comedonecrosis, ER+ (95%), PR- (0%)      02/24/2015 Clinical Stage    Stage 0: Tis N0      04/07/2015 Definitive Surgery    Right lumpectomy: DCIS, intermediate grade, with necrosis; close lateral margin      04/07/2015 Pathologic Stage    Stage 0: pTis pNx      05/17/2015 - 06/14/2015 Radiation Therapy    Adjuvant RT([place]): Right breast 42.5 Gy over 17 fractions; right breast boost 7.5 Gy over 3 fractions. Total dose: 50 Gy      09/02/2015 Survivorship    Survivorship care plan completed and mailed to patient in lieu of in person visit.       CHIEF COMPLIANT: Follow-up on tamoxifen therapy, patient has not been taking tamoxifen in over a year  INTERVAL HISTORY: Monique Watson is a 77 year old lady with above-mentioned history of recurrent right breast DCIS who was supposed to be on tamoxifen but has not been taking it for the past 1 year.  She was also supposed to get an annual breast MRI but she has not done so for the past 2 years.  She came in today for routine follow-up.  She denies any lumps or nodules in the breast.  She had a recent mammogram which was normal at [facility].  REVIEW OF SYSTEMS:   Constitutional: Denies fevers, chills or abnormal weight loss Eyes: Denies blurriness of vision Ears, nose, mouth, throat, and face: Denies mucositis or sore throat Respiratory: Denies cough, dyspnea or wheezes Cardiovascular: Denies palpitation, chest discomfort Gastrointestinal:  Denies nausea, heartburn or change in bowel habits Skin: Denies abnormal skin rashes Lymphatics: Denies new lymphadenopathy or easy bruising Neurological:Denies numbness, tingling or new weaknesses  Behavioral/Psych: Mood is stable, no new changes  Extremities: No lower extremity edema Breast:  denies any pain or lumps or nodules in either breasts All other systems were reviewed with the patient and are negative.  I have reviewed the past medical history, past surgical history, social history and family history with the patient and they are unchanged from previous note.  ALLERGIES:  is allergic to chlorhexidine; arimidex [anastrozole]; diovan [valsartan]; and uncoded nonscreenable allergen.  MEDICATIONS:  Current  Outpatient Medications  Medication Sig Dispense Refill  . aspirin 81 MG tablet Take 81 mg by mouth daily.    . Cholecalciferol (VITAMIN D3) 2000 UNITS TABS Take 1 capsule by mouth daily.     . Omega-3 Fatty Acids (FISH OIL CONCENTRATE) 1000 MG CAPS Take 1,000 mg by mouth daily.     Marland Kitchen oxybutynin (DITROPAN-XL) 10 MG 24 hr tablet Take 10 mg by mouth daily.    . Polyethylene Glycol 3350 (PEG 3350) POWD Take 17 g by mouth daily.    . potassium chloride (K-DUR,KLOR-CON) 10 MEQ tablet Take 10 mEq by mouth daily.    Marland Kitchen Respiratory Therapy Supplies (FLUTTER) DEVI Use as directed 1 each 0  . tamoxifen (NOLVADEX) 20 MG tablet Take 1 tablet (20 mg total) by mouth daily. 90 tablet 3  . vitamin E (VITAMIN E) 400 UNIT capsule Take 400 Units by mouth 2 (two) times daily.     No current facility-administered medications for this visit.     PHYSICAL EXAMINATION: ECOG PERFORMANCE STATUS: 1 - Symptomatic but completely ambulatory  Vitals:   01/16/18 1156  BP: (!) 156/71  Pulse: 60  Resp: 19  Temp: 99 F (37.2 C)  SpO2: 100%   Filed Weights   01/16/18 1156  Weight: 179 lb 6.4 oz (81.4 kg)    GENERAL:alert, no distress and comfortable SKIN: skin color, texture, turgor are normal, no rashes or significant lesions EYES: normal, Conjunctiva are pink and non-injected, sclera clear OROPHARYNX:no exudate, no erythema and lips, buccal mucosa, and tongue normal  NECK: supple, thyroid normal size, non-tender, without nodularity LYMPH:  no palpable lymphadenopathy in the cervical, axillary or inguinal LUNGS: clear to auscultation and percussion with normal breathing effort HEART: regular rate & rhythm and no murmurs and no lower extremity edema ABDOMEN:abdomen soft, non-tender and normal bowel sounds MUSCULOSKELETAL:no cyanosis of digits and no clubbing  NEURO: alert & oriented x 3 with fluent speech, no focal motor/sensory deficits EXTREMITIES: No lower extremity edema BREAST: No palpable masses or  nodules in either right or left breasts. No palpable axillary supraclavicular or infraclavicular adenopathy no breast tenderness or nipple discharge. (exam performed in the presence of a chaperone)  LABORATORY DATA:  I have reviewed the data as listed CMP Latest Ref Rng & Units 01/16/2018 01/16/2017 08/21/2016  Glucose 70 - 140 mg/dL 92 99 102  BUN 7 - 26 mg/dL 11 14.6 11.0  Creatinine 0.60 - 1.10 mg/dL 1.07 0.9 0.9  Sodium 136 - 145 mmol/L 142 144 144  Potassium 3.5 - 5.1 mmol/L 3.8 3.5 3.5  Chloride 98 - 109 mmol/L 110(H) - -  CO2 22 - 29 mmol/L '27 26 25  ' Calcium 8.4 - 10.4 mg/dL 9.0 9.1 8.6  Total Protein 6.4 - 8.3 g/dL 6.9 7.4 7.1  Total Bilirubin 0.2 - 1.2 mg/dL 0.6 0.35 0.60  Alkaline Phos 40 - 150 U/L 63 54 61  AST 5 - 34 U/L '19 23 21  ' ALT 0 - 55 U/L '20 19 13    ' Lab  Results  Component Value Date   WBC 5.5 01/16/2018   HGB 12.4 01/16/2018   HCT 36.5 01/16/2018   MCV 98.6 01/16/2018   PLT 101 (L) 01/16/2018   NEUTROABS 3.5 01/16/2018    ASSESSMENT & PLAN:  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  I recommended that she resume tamoxifen therapy and I sent a new prescription today.  1. BRCA 2 mutation: Patient needs annual mammograms and MRIs.  Patient has not had an MRI in 2 years.  We have had multiple orders for breast MRIs but they have not been done.  I will set her up for another MRI within the next few weeks. 2. 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 mild thrombocytopenia stable, no bleeding, other counts ok. 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 She will call if bleeding or  bruising.  Surveillance: 1. Bilateral breast MRI 01/26/2015: Right breast enhancing mass 8 mm which was DCIS patient needs annual breast MRIs 2. mammogram  April 2019 at [facility]: Benign 3. Breast exam 01/16/2018: Benign  I ordered a new breast MRI but I stressed the importance of doing annual breast MRIs.  Return to clinic in 1 year for follow-up     Orders Placed This Encounter  Procedures  . MR BREAST BILATERAL W WO CONTRAST INC CAD    Standing Status:   Future    Standing Expiration Date:   03/19/2019    Order Specific Question:   If indicated for the ordered procedure, I authorize the administration of contrast media per Radiology protocol    Answer:   Yes    Order Specific Question:   What is the patient's sedation requirement?    Answer:   No Sedation    Order Specific Question:   Does the patient have a pacemaker or implanted devices?    Answer:   No    Order Specific Question:   Radiology Contrast Protocol - do NOT remove file path    Answer:   [email or web address]    Order Specific Question:   Reason for Exam additional comments    Answer:   BRCA 2 mutation annual eavl    Order Specific Question:   Preferred imaging location?    Answer:   [facility] (table limit-350 lbs)   The patient has a good understanding of the overall plan. she agrees with it. she will call with any problems that may develop before the next visit here.   Monique Ohara, MD 01/16/18

## 2018-01-16 NOTE — Assessment & Plan Note (Signed)
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  I recommended that she resume tamoxifen therapy and I sent a new prescription today.  1. BRCA 2 mutation: Patient needs annual mammograms and MRIs.  Patient has not had an MRI in 2 years.  We have had multiple orders for breast MRIs but they have not been done.  I will set her up for another MRI within the next few weeks. 2. 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 mild thrombocytopenia stable, no bleeding, other counts ok. 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 She will call if bleeding or bruising.  Surveillance: 1. Bilateral breast MRI 01/26/2015: Right breast enhancing mass 8 mm which was DCIS patient needs annual breast MRIs 2. mammogram April 2019 at [facility]: Benign 3. Breast exam 01/16/2018: Benign  I ordered a new breast MRI but I stressed the importance of doing annual breast MRIs.  Return to clinic in 1 year for follow-up

## 2018-01-16 NOTE — Telephone Encounter (Signed)
Gave avs and calendar ° °

## 2018-01-17 LAB — CA 125: Cancer Antigen (CA) 125: 22.5 U/mL (ref 0.0–38.1)

## 2018-01-21 ENCOUNTER — Ambulatory Visit (INDEPENDENT_AMBULATORY_CARE_PROVIDER_SITE_OTHER): Payer: Medicare Other | Admitting: Internal Medicine

## 2018-01-21 ENCOUNTER — Encounter: Payer: Self-pay | Admitting: Internal Medicine

## 2018-01-21 DIAGNOSIS — J479 Bronchiectasis, uncomplicated: Secondary | ICD-10-CM

## 2018-01-21 LAB — PULMONARY FUNCTION TEST
DL/VA % pred: 89 %
DL/VA: 4.32 ml/min/mmHg/L
DLCO cor % pred: 62 %
DLCO cor: 15.07 ml/min/mmHg
DLCO unc % pred: 60 %
DLCO unc: 14.59 ml/min/mmHg
FEF 25-75 Post: 1.49 L/sec
FEF 25-75 Pre: 1.75 L/sec
FEF2575-%Change-Post: -14 %
FEF2575-%Pred-Post: 111 %
FEF2575-%Pred-Pre: 130 %
FEV1-%Change-Post: -2 %
FEV1-%Pred-Post: 90 %
FEV1-%Pred-Pre: 93 %
FEV1-Post: 1.44 L
FEV1-Pre: 1.48 L
FEV1FVC-%Change-Post: 4 %
FEV1FVC-%Pred-Pre: 110 %
FEV6-%Change-Post: -6 %
FEV6-%Pred-Post: 83 %
FEV6-%Pred-Pre: 89 %
FEV6-Post: 1.64 L
FEV6-Pre: 1.76 L
FEV6FVC-%Pred-Post: 104 %
FEV6FVC-%Pred-Pre: 104 %
FVC-%Change-Post: -6 %
FVC-%Pred-Post: 79 %
FVC-%Pred-Pre: 85 %
FVC-Post: 1.64 L
FVC-Pre: 1.76 L
Post FEV1/FVC ratio: 88 %
Post FEV6/FVC ratio: 100 %
Pre FEV1/FVC ratio: 84 %
Pre FEV6/FVC Ratio: 100 %
RV % pred: 76 %
RV: 1.81 L
TLC % pred: 73 %
TLC: 3.74 L

## 2018-01-21 NOTE — Progress Notes (Signed)
Subjective:     Patient ID: IO DIEUJUSTE, female   DOB: 05-29-1941,    MRN: [id number]     Brief patient profile:  79 yowf quit smoking 11/ 2018 referred to pulmonary clinic 10/22/2017 by Dr   Drema Dallas for eval of bronchiectasis with nl pfts 01/21/2018    History of Present Illness  10/22/2017 1st [facility] office visit/ Refujio Haymer   Chief Complaint  Patient presents with  . Pulmonary Consult    Referred by Dr. [name]. Pt states had PNA back in Nov 2018. She is coughing with yellow sputum. She uses her albuterol inhaler at least once per day on average.   baseline pna "every other year"  x 10-15 years and fine in between so fine in fact that she could ex at gym and no need for inhalers Has not returned  to gym p most recent dx of pna in Nov 2018  Uses saba each helps bring up  Maybe a tbsp beige more than yellow and never bloody / no better with a zpak Sleeps flat ok  Doe = MMRC1 = can walk nl pace, flat grade, can't hurry or go uphills or steps s sob   rec Bronchiectasis    Work on inhaler technique:   Use the flutter valve as much as you can to help you bring up mucus     01/21/2018  f/u ov/Draya Felker re: bronchiectasis  Chief Complaint  Patient presents with  . Follow-up    Feeling much better, PFT today, Coughing yellow mucus, some  SOB    Dyspnea:  Not limited by breathing from desired activities   Cough: rare now  Sleep: fine flat SABA use:  Not using   No obvious day to day or daytime variability or assoc excess/ purulent sputum or mucus plugs or hemoptysis or cp or chest tightness, subjective wheeze or overt sinus or hb symptoms. No unusual exposure hx or h/o childhood pna/ asthma or knowledge of premature birth.  Sleeping  Fine flat   without nocturnal  or early am exacerbation  of respiratory  c/o's or need for noct saba. Also denies any obvious fluctuation of symptoms with weather or environmental changes or other aggravating or alleviating factors except as outlined  above   Current Allergies, Complete Past Medical History, Past Surgical History, Family History, and Social History were reviewed in [employer] record.  ROS  The following are not active complaints unless bolded Hoarseness, sore throat, dysphagia, dental problems, itching, sneezing,  nasal congestion or discharge of excess mucus or purulent secretions, ear ache,   fever, chills, sweats, unintended wt loss or wt gain, classically pleuritic or exertional cp,  orthopnea pnd or arm/hand swelling  or leg swelling, presyncope, palpitations, abdominal pain, anorexia, nausea, vomiting, diarrhea  or change in bowel habits or change in bladder habits, change in stools or change in urine, dysuria, hematuria,  rash, arthralgias, visual complaints, headache, numbness, weakness or ataxia or problems with walking or coordination,  change in mood or  memory.        Current Meds  Medication Sig  . aspirin 81 MG tablet Take 81 mg by mouth daily.  . Cholecalciferol (VITAMIN D3) 2000 UNITS TABS Take 1 capsule by mouth daily.   . Omega-3 Fatty Acids (FISH OIL CONCENTRATE) 1000 MG CAPS Take 1,000 mg by mouth daily.   Marland Kitchen oxybutynin (DITROPAN-XL) 10 MG 24 hr tablet Take 10 mg by mouth daily.  . Polyethylene Glycol 3350 (PEG 3350) POWD  Take 17 g by mouth daily.  . potassium chloride (K-DUR,KLOR-CON) 10 MEQ tablet Take 10 mEq by mouth daily.  Marland Kitchen Respiratory Therapy Supplies (FLUTTER) DEVI Use as directed  . tamoxifen (NOLVADEX) 20 MG tablet Take 1 tablet (20 mg total) by mouth daily.  . vitamin E (VITAMIN E) 400 UNIT capsule Take 400 Units by mouth 2 (two) times daily.             Objective:   Physical Exam     amb bf nad     01/21/2018         178   10/22/17 181 lb (82.1 kg)  03/06/17 169 lb (76.7 kg)  01/16/17 174 lb 6.4 oz (79.1 kg)     Vital signs reviewed - Note on arrival 02 sats  98% on RA     01/21/2018   top denture   mild    insp  Pops/ squeaks both bases   HEENT: nl   turbinates bilaterally, and oropharynx. Nl external ear canals without cough reflex - top dentures    NECK :  without JVD/Nodes/TM/ nl carotid upstrokes bilaterally   LUNGS: no acc muscle use,  Nl contour chest with mild insp pops and squeaks = in bases  bilaterally without cough on insp or exp maneuvers   CV:  RRR  no s3 or murmur or increase in P2, and no edema   ABD:  soft and nontender with nl inspiratory excursion in the supine position. No bruits or organomegaly appreciated, bowel sounds nl  MS:  Nl gait/ ext warm without deformities, calf tenderness, cyanosis or clubbing No obvious joint restrictions   SKIN: warm and dry without lesions    NEURO:  alert, approp, nl sensorium with  no motor or cerebellar deficits apparent.                   Assessment:

## 2018-01-21 NOTE — Patient Instructions (Addendum)
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 of your bronchial tubes which means that they don't function perfectly normally and mucus tends to pool in certain areas of your lung which can cause pneumonia and further scarring of your lung and bronchial tubes  Whenever you develop cough congestion take mucinex or mucinex dm > these will help keep the mucus loose and flowing but if your condition worsens you need to seek help immediately preferably here or somewhere inside the [facility] system to compare xrays ( worse = darker or bloody mucus or pain on breathing in)    Work on inhaler technique:  relax and gently blow all the way out then take a nice smooth deep breath back in, triggering the inhaler at same time you start breathing in.  Hold for up to 5 seconds if you can.   Use the flutter valve as much as you can to help you bring up mucus   Be careful with oil based vitamins like fish oil as they present a problem with your bronchiectasis - use powders where you can and minimize the amount you use (instead of fish oil, eat more fish for example)    If you are satisfied with your treatment plan,  let your doctor know and he/she can either refill your medications or you can return here when your prescription runs out.     If in any way you are not 100% satisfied,  please tell us.  If 100% better, tell your friends!  Pulmonary follow up is as needed

## 2018-01-21 NOTE — Progress Notes (Signed)
Patient completed full PFT today. 

## 2018-01-23 ENCOUNTER — Encounter: Payer: Self-pay | Admitting: Internal Medicine

## 2018-01-23 NOTE — Assessment & Plan Note (Addendum)
See CT chest 08/17/17  - Spirometry 10/22/2017  wnl s am saba but active exp  rhonchi on exam  - Quant Ig's wnl  - Allergy profile 10/22/2017 >  Eos 0.1 /  IgE  45 RAST neg  - Quant TB 10/22/2017  Neg - Flutter valve 10/22/17  - PFT's  01/21/2018  FEV1 1.44 (90 % ) ratio 88  p no % improvement from saba p nothing prior to study with DLCO  60/62 % corrects to 89 % for alv volume     I had an extended final summary discussion with the patient reviewing all relevant studies completed to date and  lasting 15 to 20 minutes of a 25 minute visit on the following issues:   1) she is doing great on a very conservative regimen at this point.  2) I reviewed with her again the pathophysiology of bronchiectasis using the broken escalator analogy and emphasizing the use of Mucinex products for cough and congestion and calling of her mucus turns purulent.  3)  device teaching (flutter valve)  extended face to face time for this visit   4) Each maintenance medication was reviewed in detail including most importantly the difference between maintenance and as needed and under what circumstances the prns are to be used.  Please see AVS for specific  Instructions which are unique to this visit and I personally typed out  which were reviewed in detail in writing with the patient and a copy provided.     Pulmonary follow-up can be as needed.

## 2018-02-20 ENCOUNTER — Ambulatory Visit (HOSPITAL_COMMUNITY)
Admission: RE | Admit: 2018-02-20 | Discharge: 2018-02-20 | Disposition: A | Discharge status: Home / Self Care | Payer: Medicare Other | Source: Ambulatory Visit | Attending: Hematology and Oncology | Admitting: Hematology and Oncology

## 2018-02-20 ENCOUNTER — Telehealth: Payer: Self-pay | Admitting: Hematology and Oncology

## 2018-02-20 DIAGNOSIS — Z1231 Encounter for screening mammogram for malignant neoplasm of breast: Secondary | ICD-10-CM

## 2018-02-20 DIAGNOSIS — Z803 Family history of malignant neoplasm of breast: Secondary | ICD-10-CM | POA: Diagnosis not present

## 2018-02-20 DIAGNOSIS — N3281 Overactive bladder: Secondary | ICD-10-CM | POA: Diagnosis not present

## 2018-02-20 MED ORDER — GADOBENATE DIMEGLUMINE 529 MG/ML IV SOLN
16.0000 mL | Freq: Once | INTRAVENOUS | Status: AC | PRN
  Administered 2018-02-20: 16 mL via INTRAVENOUS

## 2018-02-20 NOTE — Telephone Encounter (Signed)
I informed the patient that the breast MRI was normal. 

## 2018-05-23 DIAGNOSIS — N3946 Mixed incontinence: Secondary | ICD-10-CM | POA: Diagnosis not present

## 2018-05-23 DIAGNOSIS — N3281 Overactive bladder: Secondary | ICD-10-CM | POA: Diagnosis not present

## 2018-05-31 DIAGNOSIS — J479 Bronchiectasis, uncomplicated: Secondary | ICD-10-CM | POA: Diagnosis not present

## 2018-05-31 DIAGNOSIS — R7989 Other specified abnormal findings of blood chemistry: Secondary | ICD-10-CM | POA: Diagnosis not present

## 2018-05-31 DIAGNOSIS — Z853 Personal history of malignant neoplasm of breast: Secondary | ICD-10-CM | POA: Diagnosis not present

## 2018-05-31 DIAGNOSIS — E876 Hypokalemia: Secondary | ICD-10-CM | POA: Diagnosis not present

## 2018-05-31 DIAGNOSIS — E559 Vitamin D deficiency, unspecified: Secondary | ICD-10-CM | POA: Diagnosis not present

## 2018-05-31 DIAGNOSIS — R635 Abnormal weight gain: Secondary | ICD-10-CM | POA: Diagnosis not present

## 2018-05-31 DIAGNOSIS — Z8543 Personal history of malignant neoplasm of ovary: Secondary | ICD-10-CM | POA: Diagnosis not present

## 2018-05-31 DIAGNOSIS — I700 Atherosclerosis of aorta: Secondary | ICD-10-CM | POA: Diagnosis not present

## 2018-05-31 DIAGNOSIS — Z87891 Personal history of nicotine dependence: Secondary | ICD-10-CM | POA: Diagnosis not present

## 2018-05-31 DIAGNOSIS — I10 Essential (primary) hypertension: Secondary | ICD-10-CM | POA: Diagnosis not present

## 2018-05-31 DIAGNOSIS — Z23 Encounter for immunization: Secondary | ICD-10-CM | POA: Diagnosis not present

## 2018-06-03 ENCOUNTER — Telehealth: Payer: Self-pay

## 2018-06-03 NOTE — Telephone Encounter (Signed)
Lab results received from [place] at [facility].  Pt sees [facility] in 2020.  Labs have been sent to medical records to be scanned into pt's chart.

## 2018-06-04 ENCOUNTER — Other Ambulatory Visit: Payer: Self-pay | Admitting: Family Medicine

## 2018-06-04 DIAGNOSIS — Z87891 Personal history of nicotine dependence: Secondary | ICD-10-CM

## 2018-06-11 ENCOUNTER — Ambulatory Visit
Admission: RE | Admit: 2018-06-11 | Discharge: 2018-06-11 | Disposition: A | Discharge status: Home / Self Care | Payer: Medicare Other | Source: Ambulatory Visit | Attending: Family Medicine | Admitting: Family Medicine

## 2018-06-11 ENCOUNTER — Other Ambulatory Visit: Payer: Self-pay | Admitting: Family Medicine

## 2018-06-11 DIAGNOSIS — Z87891 Personal history of nicotine dependence: Secondary | ICD-10-CM

## 2018-06-18 DIAGNOSIS — M6281 Muscle weakness (generalized): Secondary | ICD-10-CM | POA: Diagnosis not present

## 2018-06-18 DIAGNOSIS — M62838 Other muscle spasm: Secondary | ICD-10-CM | POA: Diagnosis not present

## 2018-06-18 DIAGNOSIS — N3946 Mixed incontinence: Secondary | ICD-10-CM | POA: Diagnosis not present

## 2018-06-18 DIAGNOSIS — R102 Pelvic and perineal pain: Secondary | ICD-10-CM | POA: Diagnosis not present

## 2018-06-21 DIAGNOSIS — M62838 Other muscle spasm: Secondary | ICD-10-CM | POA: Diagnosis not present

## 2018-06-21 DIAGNOSIS — R102 Pelvic and perineal pain: Secondary | ICD-10-CM | POA: Diagnosis not present

## 2018-06-21 DIAGNOSIS — N3946 Mixed incontinence: Secondary | ICD-10-CM | POA: Diagnosis not present

## 2018-06-21 DIAGNOSIS — M6281 Muscle weakness (generalized): Secondary | ICD-10-CM | POA: Diagnosis not present

## 2018-06-25 DIAGNOSIS — M62838 Other muscle spasm: Secondary | ICD-10-CM | POA: Diagnosis not present

## 2018-06-25 DIAGNOSIS — M6281 Muscle weakness (generalized): Secondary | ICD-10-CM | POA: Diagnosis not present

## 2018-06-25 DIAGNOSIS — R102 Pelvic and perineal pain: Secondary | ICD-10-CM | POA: Diagnosis not present

## 2018-06-25 DIAGNOSIS — N3946 Mixed incontinence: Secondary | ICD-10-CM | POA: Diagnosis not present

## 2018-06-25 DIAGNOSIS — M545 Low back pain: Secondary | ICD-10-CM | POA: Diagnosis not present

## 2018-07-03 DIAGNOSIS — N3946 Mixed incontinence: Secondary | ICD-10-CM | POA: Diagnosis not present

## 2018-07-03 DIAGNOSIS — M545 Low back pain: Secondary | ICD-10-CM | POA: Diagnosis not present

## 2018-07-03 DIAGNOSIS — R102 Pelvic and perineal pain: Secondary | ICD-10-CM | POA: Diagnosis not present

## 2018-07-03 DIAGNOSIS — M62838 Other muscle spasm: Secondary | ICD-10-CM | POA: Diagnosis not present

## 2018-07-03 DIAGNOSIS — M6281 Muscle weakness (generalized): Secondary | ICD-10-CM | POA: Diagnosis not present

## 2018-07-05 DIAGNOSIS — M62838 Other muscle spasm: Secondary | ICD-10-CM | POA: Diagnosis not present

## 2018-07-05 DIAGNOSIS — R102 Pelvic and perineal pain: Secondary | ICD-10-CM | POA: Diagnosis not present

## 2018-07-05 DIAGNOSIS — M6281 Muscle weakness (generalized): Secondary | ICD-10-CM | POA: Diagnosis not present

## 2018-07-05 DIAGNOSIS — N3946 Mixed incontinence: Secondary | ICD-10-CM | POA: Diagnosis not present

## 2018-07-08 DIAGNOSIS — M62838 Other muscle spasm: Secondary | ICD-10-CM | POA: Diagnosis not present

## 2018-07-08 DIAGNOSIS — M542 Cervicalgia: Secondary | ICD-10-CM | POA: Diagnosis not present

## 2018-07-08 DIAGNOSIS — R42 Dizziness and giddiness: Secondary | ICD-10-CM | POA: Diagnosis not present

## 2018-07-08 DIAGNOSIS — N3946 Mixed incontinence: Secondary | ICD-10-CM | POA: Diagnosis not present

## 2018-07-08 DIAGNOSIS — M6281 Muscle weakness (generalized): Secondary | ICD-10-CM | POA: Diagnosis not present

## 2018-07-08 DIAGNOSIS — H8113 Benign paroxysmal vertigo, bilateral: Secondary | ICD-10-CM | POA: Diagnosis not present

## 2018-07-15 DIAGNOSIS — M542 Cervicalgia: Secondary | ICD-10-CM | POA: Diagnosis not present

## 2018-07-15 DIAGNOSIS — H8113 Benign paroxysmal vertigo, bilateral: Secondary | ICD-10-CM | POA: Diagnosis not present

## 2018-07-15 DIAGNOSIS — M6281 Muscle weakness (generalized): Secondary | ICD-10-CM | POA: Diagnosis not present

## 2018-07-15 DIAGNOSIS — R42 Dizziness and giddiness: Secondary | ICD-10-CM | POA: Diagnosis not present

## 2018-07-15 DIAGNOSIS — M62838 Other muscle spasm: Secondary | ICD-10-CM | POA: Diagnosis not present

## 2018-08-05 DIAGNOSIS — R42 Dizziness and giddiness: Secondary | ICD-10-CM | POA: Diagnosis not present

## 2018-08-05 DIAGNOSIS — N3946 Mixed incontinence: Secondary | ICD-10-CM | POA: Diagnosis not present

## 2018-08-05 DIAGNOSIS — M6281 Muscle weakness (generalized): Secondary | ICD-10-CM | POA: Diagnosis not present

## 2018-08-05 DIAGNOSIS — M62838 Other muscle spasm: Secondary | ICD-10-CM | POA: Diagnosis not present

## 2018-08-05 DIAGNOSIS — H8113 Benign paroxysmal vertigo, bilateral: Secondary | ICD-10-CM | POA: Diagnosis not present

## 2018-08-06 DIAGNOSIS — R102 Pelvic and perineal pain: Secondary | ICD-10-CM | POA: Diagnosis not present

## 2018-08-06 DIAGNOSIS — M62838 Other muscle spasm: Secondary | ICD-10-CM | POA: Diagnosis not present

## 2018-08-06 DIAGNOSIS — N3946 Mixed incontinence: Secondary | ICD-10-CM | POA: Diagnosis not present

## 2018-08-06 DIAGNOSIS — M6281 Muscle weakness (generalized): Secondary | ICD-10-CM | POA: Diagnosis not present

## 2018-08-13 DIAGNOSIS — M6281 Muscle weakness (generalized): Secondary | ICD-10-CM | POA: Diagnosis not present

## 2018-08-13 DIAGNOSIS — M62838 Other muscle spasm: Secondary | ICD-10-CM | POA: Diagnosis not present

## 2018-08-13 DIAGNOSIS — H8113 Benign paroxysmal vertigo, bilateral: Secondary | ICD-10-CM | POA: Diagnosis not present

## 2018-08-13 DIAGNOSIS — R42 Dizziness and giddiness: Secondary | ICD-10-CM | POA: Diagnosis not present

## 2018-08-26 DIAGNOSIS — N3946 Mixed incontinence: Secondary | ICD-10-CM | POA: Diagnosis not present

## 2018-08-26 DIAGNOSIS — M62838 Other muscle spasm: Secondary | ICD-10-CM | POA: Diagnosis not present

## 2018-08-26 DIAGNOSIS — M6281 Muscle weakness (generalized): Secondary | ICD-10-CM | POA: Diagnosis not present

## 2018-08-26 DIAGNOSIS — M545 Low back pain: Secondary | ICD-10-CM | POA: Diagnosis not present

## 2018-08-26 DIAGNOSIS — R102 Pelvic and perineal pain: Secondary | ICD-10-CM | POA: Diagnosis not present

## 2018-08-28 DIAGNOSIS — N3281 Overactive bladder: Secondary | ICD-10-CM | POA: Diagnosis not present

## 2018-09-03 DIAGNOSIS — N3946 Mixed incontinence: Secondary | ICD-10-CM | POA: Diagnosis not present

## 2018-09-03 DIAGNOSIS — R102 Pelvic and perineal pain: Secondary | ICD-10-CM | POA: Diagnosis not present

## 2018-09-03 DIAGNOSIS — M6281 Muscle weakness (generalized): Secondary | ICD-10-CM | POA: Diagnosis not present

## 2018-09-03 DIAGNOSIS — M62838 Other muscle spasm: Secondary | ICD-10-CM | POA: Diagnosis not present

## 2018-09-18 HISTORY — PX: TRANSTHORACIC ECHOCARDIOGRAM: SHX275

## 2018-09-19 DIAGNOSIS — N3946 Mixed incontinence: Secondary | ICD-10-CM | POA: Diagnosis not present

## 2018-09-19 DIAGNOSIS — M6281 Muscle weakness (generalized): Secondary | ICD-10-CM | POA: Diagnosis not present

## 2018-09-19 DIAGNOSIS — M545 Low back pain: Secondary | ICD-10-CM | POA: Diagnosis not present

## 2018-09-19 DIAGNOSIS — M62838 Other muscle spasm: Secondary | ICD-10-CM | POA: Diagnosis not present

## 2018-09-27 ENCOUNTER — Emergency Department (HOSPITAL_COMMUNITY): Payer: Medicare Other

## 2018-09-27 ENCOUNTER — Encounter (HOSPITAL_COMMUNITY): Payer: Self-pay | Admitting: Emergency Medicine

## 2018-09-27 ENCOUNTER — Inpatient Hospital Stay (HOSPITAL_COMMUNITY)
Admission: EM | Admit: 2018-09-27 | Discharge: 2018-09-28 | DRG: 092 | Disposition: A | Discharge status: Home / Self Care | Payer: Medicare Other | Attending: Family Medicine | Admitting: Family Medicine

## 2018-09-27 DIAGNOSIS — Z923 Personal history of irradiation: Secondary | ICD-10-CM

## 2018-09-27 DIAGNOSIS — Z79899 Other long term (current) drug therapy: Secondary | ICD-10-CM | POA: Diagnosis not present

## 2018-09-27 DIAGNOSIS — R4182 Altered mental status, unspecified: Secondary | ICD-10-CM

## 2018-09-27 DIAGNOSIS — I491 Atrial premature depolarization: Secondary | ICD-10-CM | POA: Diagnosis present

## 2018-09-27 DIAGNOSIS — R410 Disorientation, unspecified: Secondary | ICD-10-CM | POA: Diagnosis present

## 2018-09-27 DIAGNOSIS — G92 Toxic encephalopathy: Principal | ICD-10-CM | POA: Diagnosis present

## 2018-09-27 DIAGNOSIS — D649 Anemia, unspecified: Secondary | ICD-10-CM | POA: Diagnosis present

## 2018-09-27 DIAGNOSIS — Z8543 Personal history of malignant neoplasm of ovary: Secondary | ICD-10-CM

## 2018-09-27 DIAGNOSIS — M47816 Spondylosis without myelopathy or radiculopathy, lumbar region: Secondary | ICD-10-CM | POA: Diagnosis present

## 2018-09-27 DIAGNOSIS — Z803 Family history of malignant neoplasm of breast: Secondary | ICD-10-CM | POA: Diagnosis not present

## 2018-09-27 DIAGNOSIS — Z87891 Personal history of nicotine dependence: Secondary | ICD-10-CM

## 2018-09-27 DIAGNOSIS — R2681 Unsteadiness on feet: Secondary | ICD-10-CM | POA: Diagnosis present

## 2018-09-27 DIAGNOSIS — R4702 Dysphasia: Secondary | ICD-10-CM | POA: Diagnosis not present

## 2018-09-27 DIAGNOSIS — J479 Bronchiectasis, uncomplicated: Secondary | ICD-10-CM | POA: Diagnosis not present

## 2018-09-27 DIAGNOSIS — Z853 Personal history of malignant neoplasm of breast: Secondary | ICD-10-CM | POA: Diagnosis not present

## 2018-09-27 DIAGNOSIS — D696 Thrombocytopenia, unspecified: Secondary | ICD-10-CM | POA: Diagnosis present

## 2018-09-27 DIAGNOSIS — I6523 Occlusion and stenosis of bilateral carotid arteries: Secondary | ICD-10-CM | POA: Diagnosis present

## 2018-09-27 DIAGNOSIS — Z8701 Personal history of pneumonia (recurrent): Secondary | ICD-10-CM

## 2018-09-27 DIAGNOSIS — Z800 Family history of malignant neoplasm of digestive organs: Secondary | ICD-10-CM

## 2018-09-27 DIAGNOSIS — E876 Hypokalemia: Secondary | ICD-10-CM | POA: Diagnosis present

## 2018-09-27 DIAGNOSIS — R1110 Vomiting, unspecified: Secondary | ICD-10-CM | POA: Diagnosis not present

## 2018-09-27 DIAGNOSIS — I10 Essential (primary) hypertension: Secondary | ICD-10-CM | POA: Diagnosis not present

## 2018-09-27 DIAGNOSIS — R000 Tachycardia, unspecified: Secondary | ICD-10-CM | POA: Diagnosis not present

## 2018-09-27 DIAGNOSIS — I700 Atherosclerosis of aorta: Secondary | ICD-10-CM | POA: Diagnosis present

## 2018-09-27 DIAGNOSIS — Z888 Allergy status to other drugs, medicaments and biological substances status: Secondary | ICD-10-CM | POA: Diagnosis not present

## 2018-09-27 DIAGNOSIS — Z809 Family history of malignant neoplasm, unspecified: Secondary | ICD-10-CM

## 2018-09-27 DIAGNOSIS — R29818 Other symptoms and signs involving the nervous system: Secondary | ICD-10-CM | POA: Diagnosis not present

## 2018-09-27 DIAGNOSIS — R2689 Other abnormalities of gait and mobility: Secondary | ICD-10-CM | POA: Diagnosis present

## 2018-09-27 DIAGNOSIS — R058 Other specified cough: Secondary | ICD-10-CM

## 2018-09-27 DIAGNOSIS — R531 Weakness: Secondary | ICD-10-CM

## 2018-09-27 DIAGNOSIS — M1990 Unspecified osteoarthritis, unspecified site: Secondary | ICD-10-CM | POA: Diagnosis present

## 2018-09-27 DIAGNOSIS — J189 Pneumonia, unspecified organism: Secondary | ICD-10-CM | POA: Diagnosis not present

## 2018-09-27 DIAGNOSIS — J209 Acute bronchitis, unspecified: Secondary | ICD-10-CM | POA: Diagnosis present

## 2018-09-27 DIAGNOSIS — J849 Interstitial pulmonary disease, unspecified: Secondary | ICD-10-CM | POA: Diagnosis present

## 2018-09-27 DIAGNOSIS — Z90710 Acquired absence of both cervix and uterus: Secondary | ICD-10-CM

## 2018-09-27 DIAGNOSIS — J470 Bronchiectasis with acute lower respiratory infection: Secondary | ICD-10-CM | POA: Diagnosis present

## 2018-09-27 DIAGNOSIS — R0902 Hypoxemia: Secondary | ICD-10-CM | POA: Diagnosis not present

## 2018-09-27 DIAGNOSIS — Z8041 Family history of malignant neoplasm of ovary: Secondary | ICD-10-CM | POA: Diagnosis not present

## 2018-09-27 DIAGNOSIS — G4489 Other headache syndrome: Secondary | ICD-10-CM | POA: Diagnosis not present

## 2018-09-27 DIAGNOSIS — I2510 Atherosclerotic heart disease of native coronary artery without angina pectoris: Secondary | ICD-10-CM | POA: Diagnosis present

## 2018-09-27 DIAGNOSIS — I119 Hypertensive heart disease without heart failure: Secondary | ICD-10-CM | POA: Diagnosis present

## 2018-09-27 DIAGNOSIS — R109 Unspecified abdominal pain: Secondary | ICD-10-CM | POA: Diagnosis not present

## 2018-09-27 DIAGNOSIS — H538 Other visual disturbances: Secondary | ICD-10-CM | POA: Diagnosis not present

## 2018-09-27 DIAGNOSIS — Z86010 Personal history of colonic polyps: Secondary | ICD-10-CM

## 2018-09-27 DIAGNOSIS — R05 Cough: Secondary | ICD-10-CM | POA: Diagnosis not present

## 2018-09-27 DIAGNOSIS — I6502 Occlusion and stenosis of left vertebral artery: Secondary | ICD-10-CM | POA: Diagnosis not present

## 2018-09-27 DIAGNOSIS — Z7982 Long term (current) use of aspirin: Secondary | ICD-10-CM

## 2018-09-27 DIAGNOSIS — R55 Syncope and collapse: Secondary | ICD-10-CM | POA: Diagnosis not present

## 2018-09-27 DIAGNOSIS — G928 Other toxic encephalopathy: Secondary | ICD-10-CM | POA: Diagnosis present

## 2018-09-27 LAB — URINALYSIS, ROUTINE W REFLEX MICROSCOPIC
Bilirubin Urine: NEGATIVE
Glucose, UA: NEGATIVE mg/dL
Hgb urine dipstick: NEGATIVE
Ketones, ur: NEGATIVE mg/dL
Leukocytes, UA: NEGATIVE
Nitrite: NEGATIVE
Protein, ur: NEGATIVE mg/dL
Specific Gravity, Urine: 1.042 — ABNORMAL HIGH (ref 1.005–1.030)
pH: 7 (ref 5.0–8.0)

## 2018-09-27 LAB — DIFFERENTIAL
Abs Immature Granulocytes: 0.03 10*3/uL (ref 0.00–0.07)
Basophils Absolute: 0 10*3/uL (ref 0.0–0.1)
Basophils Relative: 0 %
Eosinophils Absolute: 0.1 10*3/uL (ref 0.0–0.5)
Eosinophils Relative: 1 %
Immature Granulocytes: 0 %
Lymphocytes Relative: 48 %
Lymphs Abs: 4.3 10*3/uL — ABNORMAL HIGH (ref 0.7–4.0)
Monocytes Absolute: 1 10*3/uL (ref 0.1–1.0)
Monocytes Relative: 11 %
Neutro Abs: 3.6 10*3/uL (ref 1.7–7.7)
Neutrophils Relative %: 40 %

## 2018-09-27 LAB — CBC
HCT: 41.6 % (ref 36.0–46.0)
HCT: 38 % (ref 36.0–46.0)
Hemoglobin: 13.6 g/dL (ref 12.0–15.0)
Hemoglobin: 12.5 g/dL (ref 12.0–15.0)
MCH: 32.2 pg (ref 26.0–34.0)
MCH: 32.3 pg (ref 26.0–34.0)
MCHC: 32.7 g/dL (ref 30.0–36.0)
MCHC: 32.9 g/dL (ref 30.0–36.0)
MCV: 98.3 fL (ref 80.0–100.0)
MCV: 98.2 fL (ref 80.0–100.0)
Platelets: 124 10*3/uL — ABNORMAL LOW (ref 150–400)
Platelets: 101 10*3/uL — ABNORMAL LOW (ref 150–400)
RBC: 4.23 MIL/uL (ref 3.87–5.11)
RBC: 3.87 MIL/uL (ref 3.87–5.11)
RDW: 12.9 % (ref 11.5–15.5)
RDW: 13 % (ref 11.5–15.5)
WBC: 9.1 10*3/uL (ref 4.0–10.5)
WBC: 5.9 10*3/uL (ref 4.0–10.5)
nRBC: 0 % (ref 0.0–0.2)
nRBC: 0 % (ref 0.0–0.2)

## 2018-09-27 LAB — RESPIRATORY PANEL BY PCR

## 2018-09-27 LAB — COMPREHENSIVE METABOLIC PANEL
ALT: 20 U/L (ref 0–44)
AST: 33 U/L (ref 15–41)
Albumin: 3.4 g/dL — ABNORMAL LOW (ref 3.5–5.0)
Alkaline Phosphatase: 50 U/L (ref 38–126)
Anion gap: 11 (ref 5–15)
BUN: 11 mg/dL (ref 8–23)
CO2: 22 mmol/L (ref 22–32)
Calcium: 9 mg/dL (ref 8.9–10.3)
Chloride: 108 mmol/L (ref 98–111)
Creatinine, Ser: 0.9 mg/dL (ref 0.44–1.00)
GFR calc Af Amer: >60 mL/min (ref >60)
GFR calc non Af Amer: >60 mL/min (ref >60)
Glucose, Bld: 139 mg/dL — ABNORMAL HIGH (ref 70–99)
Potassium: 3.1 mmol/L — ABNORMAL LOW (ref 3.5–5.1)
Sodium: 141 mmol/L (ref 135–145)
Total Bilirubin: 0.8 mg/dL (ref 0.3–1.2)
Total Protein: 7.4 g/dL (ref 6.5–8.1)

## 2018-09-27 LAB — PROTIME-INR
INR: 0.98
Prothrombin Time: 12.9 seconds (ref 11.4–15.2)

## 2018-09-27 LAB — CREATININE, SERUM
Creatinine, Ser: 0.88 mg/dL (ref 0.44–1.00)
GFR calc Af Amer: >60 mL/min (ref >60)
GFR calc non Af Amer: >60 mL/min (ref >60)

## 2018-09-27 LAB — I-STAT CHEM 8, ED
BUN: 12 mg/dL (ref 8–23)
Calcium, Ion: 1.03 mmol/L — ABNORMAL LOW (ref 1.15–1.40)
Chloride: 109 mmol/L (ref 98–111)
Creatinine, Ser: 0.7 mg/dL (ref 0.44–1.00)
Glucose, Bld: 141 mg/dL — ABNORMAL HIGH (ref 70–99)
HCT: 42 % (ref 36.0–46.0)
Hemoglobin: 14.3 g/dL (ref 12.0–15.0)
Potassium: 3.1 mmol/L — ABNORMAL LOW (ref 3.5–5.1)
Sodium: 142 mmol/L (ref 135–145)
TCO2: 22 mmol/L (ref 22–32)

## 2018-09-27 LAB — APTT: aPTT: 26 seconds (ref 24–36)

## 2018-09-27 LAB — I-STAT TROPONIN, ED: Troponin i, poc: 0 ng/mL (ref 0.00–0.08)

## 2018-09-27 MED ORDER — VITAMIN D 25 MCG (1000 UNIT) PO TABS
1000.0000 [IU] | ORAL_TABLET | Freq: Every day | ORAL | Status: DC
  Administered 2018-09-28: 1000 [IU] via ORAL
  Filled 2018-09-27: qty 1

## 2018-09-27 MED ORDER — IOPAMIDOL (ISOVUE-370) INJECTION 76%
100.0000 mL | Freq: Once | INTRAVENOUS | Status: AC | PRN
  Administered 2018-09-27: 100 mL via INTRAVENOUS

## 2018-09-27 MED ORDER — SODIUM CHLORIDE 0.9 % IV SOLN
1.0000 g | Freq: Three times a day (TID) | INTRAVENOUS | Status: DC
  Administered 2018-09-28: 1 g via INTRAVENOUS
  Filled 2018-09-27 (×4): qty 1

## 2018-09-27 MED ORDER — POTASSIUM CHLORIDE 10 MEQ/100ML IV SOLN
10.0000 meq | INTRAVENOUS | Status: AC
  Administered 2018-09-27 – 2018-09-28 (×3): 10 meq via INTRAVENOUS
  Filled 2018-09-27 (×3): qty 100

## 2018-09-27 MED ORDER — METOCLOPRAMIDE HCL 5 MG/ML IJ SOLN
10.0000 mg | Freq: Once | INTRAMUSCULAR | Status: AC
  Administered 2018-09-27: 10 mg via INTRAVENOUS
  Filled 2018-09-27: qty 2

## 2018-09-27 MED ORDER — TAMOXIFEN CITRATE 10 MG PO TABS
20.0000 mg | ORAL_TABLET | Freq: Every day | ORAL | Status: DC
  Administered 2018-09-28: 20 mg via ORAL
  Filled 2018-09-27: qty 2

## 2018-09-27 MED ORDER — ACETAMINOPHEN 650 MG RE SUPP: 650.0000 mg | RECTAL | Status: DC | PRN

## 2018-09-27 MED ORDER — VITAMIN E 180 MG (400 UNIT) PO CAPS
400.0000 [IU] | ORAL_CAPSULE | Freq: Two times a day (BID) | ORAL | Status: DC
  Administered 2018-09-28: 400 [IU] via ORAL
  Filled 2018-09-27 (×3): qty 1

## 2018-09-27 MED ORDER — ACETAMINOPHEN 325 MG PO TABS
650.0000 mg | ORAL_TABLET | ORAL | Status: DC | PRN
  Filled 2018-09-27: qty 2

## 2018-09-27 MED ORDER — VANCOMYCIN HCL 10 G IV SOLR
1750.0000 mg | Freq: Once | INTRAVENOUS | Status: DC
  Filled 2018-09-27: qty 1750

## 2018-09-27 MED ORDER — STROKE: EARLY STAGES OF RECOVERY BOOK
Freq: Once | Status: AC
  Administered 2018-09-27: 21:00:00
  Filled 2018-09-27: qty 1

## 2018-09-27 MED ORDER — ONDANSETRON HCL 4 MG/2ML IJ SOLN
4.0000 mg | Freq: Once | INTRAMUSCULAR | Status: AC
  Administered 2018-09-27: 4 mg via INTRAVENOUS
  Filled 2018-09-27: qty 2

## 2018-09-27 MED ORDER — SENNOSIDES-DOCUSATE SODIUM 8.6-50 MG PO TABS: 1.0000 | ORAL_TABLET | Freq: Every evening | ORAL | Status: DC | PRN

## 2018-09-27 MED ORDER — ACETAMINOPHEN 160 MG/5ML PO SOLN: 650.0000 mg | ORAL | Status: DC | PRN

## 2018-09-27 MED ORDER — POTASSIUM CHLORIDE CRYS ER 20 MEQ PO TBCR
10.0000 meq | EXTENDED_RELEASE_TABLET | Freq: Every day | ORAL | Status: DC
  Administered 2018-09-28: 10 meq via ORAL
  Filled 2018-09-27: qty 1

## 2018-09-27 MED ORDER — POLYETHYLENE GLYCOL 3350 17 G PO PACK
17.0000 g | PACK | Freq: Every day | ORAL | Status: DC
  Administered 2018-09-28: 17 g via ORAL
  Filled 2018-09-27: qty 1

## 2018-09-27 MED ORDER — IOHEXOL 300 MG/ML  SOLN
100.0000 mL | Freq: Once | INTRAMUSCULAR | Status: AC | PRN
  Administered 2018-09-27: 100 mL via INTRAVENOUS

## 2018-09-27 MED ORDER — OXYBUTYNIN CHLORIDE ER 10 MG PO TB24
10.0000 mg | ORAL_TABLET | Freq: Every day | ORAL | Status: DC
  Filled 2018-09-27: qty 1

## 2018-09-27 MED ORDER — ENOXAPARIN SODIUM 40 MG/0.4ML ~~LOC~~ SOLN
40.0000 mg | SUBCUTANEOUS | Status: DC
  Administered 2018-09-27: 40 mg via SUBCUTANEOUS
  Filled 2018-09-27: qty 0.4

## 2018-09-27 MED ORDER — VANCOMYCIN HCL IN DEXTROSE 750-5 MG/150ML-% IV SOLN
750.0000 mg | Freq: Two times a day (BID) | INTRAVENOUS | Status: DC
  Filled 2018-09-27: qty 150

## 2018-09-27 MED ORDER — SODIUM CHLORIDE 0.9 % IV SOLN
INTRAVENOUS | Status: AC
  Administered 2018-09-27: 21:00:00 via INTRAVENOUS

## 2018-09-27 NOTE — Consult Note (Addendum)
Neurology Consultation  Reason for Consult: Code Stroke Referring Physician: Dr. Ashok Cordia  CC: Blurred vision, left-sided weakness  History is obtained from: Chart, family  HPI: Monique Watson is a 78 y.o. female past medical history of left breast cancer, right breast DCI, ovarian cancer status post XRT, hypertension, frequent recurrent pneumonias, anemia and thrombocytopenia, presented to the emergency room via EMS when family was called because of a sudden onset of blurred vision and left-sided weakness. The family was very concerned that the patient had developed sudden onset of blurred vision and left-sided weakness this morning with a last known normal of 11:45 AM. For EMS, the patient had some left-sided weakness and was unable to pivot to the left when they try to put her on the stretcher.  She had no facial asymmetry but was not following commands.  She also had incomprehensible speech for EMS. Family reported that she had been sick with a GI bug since Christmas. She also had multiple pneumonias in the past few months to years but they usually come on with different speed-the more insidious with this change was very sudden. En route to the hospital, the patient threw up and was given 1 dose of Zofran. Her pupils were also pinpoint and she received 1 dose of Narcan. Upon stroke team evaluation initially, the patient was awake, alert, nonverbal, not following any commands.  She seemed to be neglecting the left side but later she was blinking and responding to stimuli from the left.  At one point in the CT scanner she also had some difficulty moving the right side. Due to the nonfocal exam and later on imaging that revealed no LVO, she is not a candidate for TPA or EVT.  In the CT scanner, she had another episode of emesis and was taken back to the room in the emergency department.  She has had complaints of dizziness for which she is receiving vestibular rehab for many years.  She drives  herself.  Recently able to start some exercising at the senior center in the gym-treadmill.  LKW: 11:45 AM tpa given?: no, nonfocal exam Premorbid modified Rankin scale (mRS): 1  ROS: Positive for cough, diarrhea, vomiting, nausea, ongoing balance issues for which she is receiving vestibular rehab..   Past Medical History:  Diagnosis Date  . Anemia   . Arthritis   . Breast cancer ([place]) 09/29/2011  . Hypertension   . Ovarian cancer ([place]) 09/29/2011  . Pneumonia   . Radiation 01/14/2009-02/10/2009   left breast 45 Gy, boosted to 5 Gy   Family History  Problem Relation Age of Onset  . Ovarian cancer Mother 97  . Colon cancer Brother 32  . Breast cancer Paternal Aunt        6-7 with breast cancer; one bilateral, several premenopausal  . Pancreatitis Brother 79  . Colon cancer Paternal Aunt   . Cancer Cousin        NOS    Social History:   reports that she quit smoking about 14 months ago. Her smoking use included cigarettes. She has a 58.00 pack-year smoking history. She has never used smokeless tobacco. She reports current alcohol use of about 1.0 standard drinks of alcohol per week. She reports that she does not use drugs.  Medications No current facility-administered medications for this encounter.   Current Outpatient Medications:  .  aspirin 81 MG tablet, Take 81 mg by mouth daily., Disp: , Rfl:  .  Cholecalciferol (VITAMIN D3) 2000 UNITS TABS, Take 1  capsule by mouth daily. , Disp: , Rfl:  .  Omega-3 Fatty Acids (FISH OIL CONCENTRATE) 1000 MG CAPS, Take 1,000 mg by mouth daily. , Disp: , Rfl:  .  oxybutynin (DITROPAN-XL) 10 MG 24 hr tablet, Take 10 mg by mouth daily., Disp: , Rfl:  .  Polyethylene Glycol 3350 (PEG 3350) POWD, Take 17 g by mouth daily., Disp: , Rfl:  .  potassium chloride (K-DUR,KLOR-CON) 10 MEQ tablet, Take 10 mEq by mouth daily., Disp: , Rfl:  .  Respiratory Therapy Supplies (FLUTTER) DEVI, Use as directed, Disp: 1 each, Rfl: 0 .  tamoxifen (NOLVADEX) 20  MG tablet, Take 1 tablet (20 mg total) by mouth daily., Disp: 90 tablet, Rfl: 3 .  vitamin E (VITAMIN E) 400 UNIT capsule, Take 400 Units by mouth 2 (two) times daily., Disp: , Rfl:    Exam: Current vital signs: BP (!) 190/85   Pulse 70   Resp 14   Ht 5\' 4"  (1.626 m)   Wt 85 kg   SpO2 96%   BMI 32.17 kg/m  Vital signs in last 24 hours: Pulse Rate:  [70] 70 (01/10 1320) Resp:  [14] 14 (01/10 1320) BP: (190)/(85) 190/85 (01/10 1315) SpO2:  [96 %] 96 % (01/10 1320) Weight:  [85 kg] 85 kg (01/10 1200) General: Patient is awake, alert in some mild respiratory distress. HEENT: Normocephalic atraumatic dry mucous membranes with some leftover particles from the vomitus before. CVS: S1-2 heard regular rate rhythm Chest: Scattered rales all over. Abdomen: Nontender Neurological exam Patient awake, alert. She does not following commands. She is nonverbal Seem to have extremely poor attention concentration. Cranial nerves: Pupils are equal round reactive light, there is no gaze preference and she is able to look at all size although it seemed like she had a right gaze preference first but then she was able to look both sides.  She initially did not blink to threat from the left but later on she did.  Her face is symmetric.  Difficult to ascertain auditory acuity. Motor exam: She intermittently moved both her right and left arms but was unable to keep them above the bed and both arms fell to the bed before 10 seconds.  Both lower extremities fell to the bed before 5 seconds. Sensory exam: Initially less withdrawal to noxious stimulation on the left but later on similar withdrawal.  Initially seemed to be neglecting on the left but later that seemed to have resolved COordination exam cannot be performed NIHSS 1a Level of Conscious.: 0 1b LOC Questions: 2 1c LOC Commands: 2 2 Best Gaze: 0 3 Visual: 1 4 Facial Palsy: 0 5a Motor Arm - left: 2 5b Motor Arm - Right: 2 6a Motor Leg - Left:  2 6b Motor Leg - Right: 2 7 Limb Ataxia: 0 8 Sensory: 1 9 Best Language: 3 10 Dysarthria: 2 11 Extinct. and Inatten.: 1 TOTAL: 19  Labs I have reviewed labs in epic and the results pertinent to this consultation are:  CBC    Component Value Date/Time   WBC 9.1 09/27/2018 1250   RBC 4.23 09/27/2018 1250   HGB 14.3 09/27/2018 1253   HGB 12.8 01/16/2017 1430   HCT 42.0 09/27/2018 1253   HCT 37.7 01/16/2017 1430   PLT 124 (L) 09/27/2018 1250   PLT 99 (L) 01/16/2017 1430   MCV 98.3 09/27/2018 1250   MCV 99.2 01/16/2017 1430   MCH 32.2 09/27/2018 1250   MCHC 32.7 09/27/2018 1250   RDW  12.9 09/27/2018 1250   RDW 12.9 01/16/2017 1430   LYMPHSABS 4.3 (H) 09/27/2018 1250   LYMPHSABS 1.6 01/16/2017 1430   MONOABS 1.0 09/27/2018 1250   MONOABS 0.5 01/16/2017 1430   EOSABS 0.1 09/27/2018 1250   EOSABS 0.1 01/16/2017 1430   BASOSABS 0.0 09/27/2018 1250   BASOSABS 0.0 01/16/2017 1430    CMP     Component Value Date/Time   NA 142 09/27/2018 1253   NA 144 01/16/2017 1430   K 3.1 (L) 09/27/2018 1253   K 3.5 01/16/2017 1430   CL 109 09/27/2018 1253   CL 112 (H) 10/24/2012 1119   CO2 27 01/16/2018 1133   CO2 26 01/16/2017 1430   GLUCOSE 141 (H) 09/27/2018 1253   GLUCOSE 99 01/16/2017 1430   GLUCOSE 103 (H) 10/24/2012 1119   BUN 12 09/27/2018 1253   BUN 14.6 01/16/2017 1430   CREATININE 0.70 09/27/2018 1253   CREATININE 0.9 01/16/2017 1430   CALCIUM 9.0 01/16/2018 1133   CALCIUM 9.1 01/16/2017 1430   PROT 6.9 01/16/2018 1133   PROT 7.4 01/16/2017 1430   ALBUMIN 3.1 (L) 01/16/2018 1133   ALBUMIN 3.4 (L) 01/16/2017 1430   AST 19 01/16/2018 1133   AST 23 01/16/2017 1430   ALT 20 01/16/2018 1133   ALT 19 01/16/2017 1430   ALKPHOS 63 01/16/2018 1133   ALKPHOS 54 01/16/2017 1430   BILITOT 0.6 01/16/2018 1133   BILITOT 0.35 01/16/2017 1430   GFRNONAA 48 (L) 01/16/2018 1133   GFRAA 56 (L) 01/16/2018 1133   Imaging I have reviewed the images obtained: CT-scan of the  brain-no acute changes. CTA head and neck with no emergent large vessel occlusion and patent anterior posterior circulation. CT perfusion study with no perfusion deficit.  Assessment:  78 year old woman with a past medical history of left breast cancer status post XRT, right breast DCI, ovarian cancer status post XRT, hypertension, frequent recurrent pneumonias anemia and thrombocytopenia presented to the emergency room for sudden onset of blurred vision and possible left-sided weakness. Her exam was nonfocal due to weakness in all 4 extremities.  Initially there was some concern for left-sided neglect but that seemed to resolve and was more related to general poor attention concentration. Not a candidate for IV TPA due to nonfocal exam. CTA head and neck showed no large vessel occlusion hence not a candidate for endovascular thrombectomy. Her symptoms are likely related to toxic metabolic encephalopathy as she has had multiple pneumonias in the past and also been recently sick with a GI illness with diarrhea and nausea and vomiting. An MRI of the brain should be done to rule out CNS pathology and if it does not show stroke, no stroke work-up is indicated.  Impression: Toxic metabolic encephalopathy Evaluate for stroke  Recommendations: Management of toxic metabolic derangements per primary team as you are. Chest x-ray-history of frequent recurrent pneumonias. Urinalysis Abdominal films (had projectile vomiting) and further management per ER/IM MRI of the brain to evaluate for any evidence of stroke.  Pursue stroke work-up only if MRI reveals a stroke. Case was discussed with the ED provider Dr. Ashok Cordia in person. Also spoke with the patient's daughter in detail and explained the philosophy of care and answered all the questions.  -- Amie Portland, MD [facility] Pager: [phone] If 7pm to 7am, please call on call as listed on AMION.

## 2018-09-27 NOTE — Progress Notes (Signed)
Pt IV infiltrated twice during infusion of potassium, IV stopped immediately, unable to infuse potassium. Will  Continue to infuse Vanc for now. MD aware.

## 2018-09-27 NOTE — H&P (Signed)
History and Physical    Monique Watson DGL:[id number] DOB: 14-Feb-1941 DOA: 09/27/2018  PCP: [name], MD  Patient coming from: Home  I have personally briefly reviewed patient's old medical records in [place]  Chief Complaint: Acute weakness and inability to speak  HPI: Monique Watson is a 78 y.o. 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  Last known normal was 11:45 AM this morning when patient was making breakfast she became wobbly according to her son who was present and witnessed the event.  Her to becoming wobbly she was having some coughing but she often coughs due to her bronchiectasis.  She was speaking until she left the house and then in the ambulance developed a loss of consciousness and 1 presented here was unable to give any history and had significant decline in her neurological status.  Initially she is awake and nonverbal and not following any commands she had a left-sided weakness and was not being the left side at all.  As the day progressed the patient has been able to move her left side she even told me she was cold when I was helping the aide change her sheets and she expressed SI of relief when I turned the overhead lights out. In the ED she obtained a stat CT scan and a stat CT angiogram of the head and it was unremarkable MRI is pending read she was referred to medicine for further evaluation and management of stroke versus toxic metabolic encephalopathy due to possible pneumonia.  Review of Systems: Level 5 caveat unable to obtain due to inability to communicate  Past Medical History:  Diagnosis Date  . Anemia   . Arthritis   .  Breast cancer ([place]) 09/29/2011  . Hypertension   . Ovarian cancer ([place]) 09/29/2011  . Pneumonia   . Radiation 01/14/2009-02/10/2009   left breast 45 Gy, boosted to 5 Gy    Past Surgical History:  Procedure Laterality Date  . ABDOMINAL HYSTERECTOMY    . APPENDECTOMY    . BLADDER SURGERY    . BREAST LUMPECTOMY    . BREAST LUMPECTOMY WITH RADIOACTIVE SEED LOCALIZATION Right 04/07/2015   Procedure: BREAST LUMPECTOMY WITH RADIOACTIVE SEED LOCALIZATION;  Surgeon: Erroll Luna, MD;  Location: [facility];  Service: General;  Laterality: Right;  . PORT-A-CATH REMOVAL N/A 07/13/2015   Procedure: REMOVAL PORT-A-CATH;  Surgeon: Erroll Luna, MD;  Location: [facility];  Service: General;  Laterality: N/A;  . TONSILLECTOMY      Social History   Social History Narrative   Lives with husband   Caffeine use- coffee 2 cups daily, sometimes tea     reports that she quit smoking about 14 months ago. Her smoking use included cigarettes. She has a 58.00 pack-year smoking history. She has never used smokeless tobacco. She reports current alcohol use of about 1.0 standard drinks of alcohol per week. She reports that she does not use drugs.  Allergies  Allergen Reactions  . Chlorhexidine Anaphylaxis, Hives, Shortness Of Breath and Itching    anaphylaxis  . Arimidex [Anastrozole] Other (See Comments)    Body aches per patient. Irven Baltimore, RN.  Marland Kitchen Diovan [Valsartan] Other (See Comments)    arthralgias  . Uncoded Nonscreenable Allergen  Blood pressure medication patient stated that she has a low tolerance for    Family History  Problem Relation Age of Onset  . Ovarian cancer Mother 8  . Colon cancer Brother 45  . Breast cancer Paternal Aunt        6-7 with breast cancer; one bilateral, several premenopausal  . Pancreatitis Brother 20  . Colon cancer Paternal Aunt   . Cancer Cousin        NOS     Prior to Admission medications   Medication Sig Start Date  End Date Taking? Authorizing Provider  aspirin 81 MG tablet Take 81 mg by mouth daily.    [provider]  Cholecalciferol (VITAMIN D3) 2000 UNITS TABS Take 1 capsule by mouth daily.     [provider]  Omega-3 Fatty Acids (FISH OIL CONCENTRATE) 1000 MG CAPS Take 1,000 mg by mouth daily.     [provider]  oxybutynin (DITROPAN-XL) 10 MG 24 hr tablet Take 10 mg by mouth daily. 07/05/16   [provider]  Polyethylene Glycol 3350 (PEG 3350) POWD Take 17 g by mouth daily. 07/05/16   [provider]  potassium chloride (K-DUR,KLOR-CON) 10 MEQ tablet Take 10 mEq by mouth daily.    [provider]  Respiratory Therapy Supplies (FLUTTER) DEVI Use as directed 10/22/17   Tanda Rockers, MD  tamoxifen (NOLVADEX) 20 MG tablet Take 1 tablet (20 mg total) by mouth daily. 01/16/18   Nicholas Lose, MD  vitamin E (VITAMIN E) 400 UNIT capsule Take 400 Units by mouth 2 (two) times daily.    [provider]    Physical Exam:  Constitutional: Lying in bed minimally responsive answers occasional questions with one-word but frequently does not answer at all.  No respiratory distress Vitals:   09/27/18 1357 09/27/18 1400 09/27/18 1515 09/27/18 1615  BP:  136/65 (!) 163/57 (!) 145/61  Pulse:  71 72 74  Resp:  13 13 13   Temp: 98.7 F (37.1 C)     TempSrc: Oral     SpO2:  99% 99% 100%  Weight:      Height:       Eyes: PERRL, lids and conjunctivae normal ENMT: Mucous membranes are dry. Posterior pharynx clear of any exudate or lesions.Normal dentition.  Neck: normal, supple, no masses, no thyromegaly Respiratory: Coarse breath sounds with Velcro-like rales bilaterally, no wheezing, no crackles. Normal respiratory effort. No accessory muscle use.  Cardiovascular: Regular rate and rhythm, no murmurs / rubs / gallops. No extremity edema. 2+ pedal pulses. No carotid bruits.  Abdomen: no tenderness, no masses palpated. No hepatosplenomegaly. Bowel sounds  positive.  Musculoskeletal: no clubbing / cyanosis. No joint deformity upper and lower extremities. Good ROM, no contractures. Normal muscle tone.  Skin: no rashes, lesions, ulcers. No induration Neurologic: Globally weak does exhibit motion on both sides.   Labs on Admission: I have personally reviewed following labs and imaging studies  CBC: Recent Labs  Lab 09/27/18 1250 09/27/18 1253  WBC 9.1  --   NEUTROABS 3.6  --   HGB 13.6 14.3  HCT 41.6 42.0  MCV 98.3  --   PLT 124*  --    Basic Metabolic Panel: Recent Labs  Lab 09/27/18 1250 09/27/18 1253  NA 141 142  K 3.1* 3.1*  CL 108 109  CO2 22  --   GLUCOSE 139* 141*  BUN 11 12  CREATININE 0.90 0.70  CALCIUM 9.0  --    GFR: Estimated Creatinine  Clearance: 60.1 mL/min (by C-G formula based on SCr of 0.7 mg/dL). Liver Function Tests: Recent Labs  Lab 09/27/18 1250  AST 33  ALT 20  ALKPHOS 50  BILITOT 0.8  PROT 7.4  ALBUMIN 3.4*   No results for input(s): LIPASE, AMYLASE in the last 168 hours. No results for input(s): AMMONIA in the last 168 hours. Coagulation Profile: Recent Labs  Lab 09/27/18 1250  INR 0.98   Urine analysis:    Component Value Date/Time   COLORURINE YELLOW 09/27/2018 1603   APPEARANCEUR CLEAR 09/27/2018 1603   LABSPEC 1.042 (H) 09/27/2018 1603   PHURINE 7.0 09/27/2018 1603   GLUCOSEU NEGATIVE 09/27/2018 1603   HGBUR NEGATIVE 09/27/2018 1603   BILIRUBINUR NEGATIVE 09/27/2018 1603   KETONESUR NEGATIVE 09/27/2018 1603   PROTEINUR NEGATIVE 09/27/2018 1603   UROBILINOGEN 0.2 05/20/2013 0444   NITRITE NEGATIVE 09/27/2018 1603   LEUKOCYTESUR NEGATIVE 09/27/2018 1603    Radiological Exams on Admission: Ct Angio Head W Or Wo Contrast  Result Date: 09/27/2018 CLINICAL DATA:  Aphasia.  Blurred vision.  History of breast cancer. EXAM: CT ANGIOGRAPHY HEAD AND NECK CT PERFUSION BRAIN TECHNIQUE: Multidetector CT imaging of the head and neck was performed using the standard protocol during  bolus administration of intravenous contrast. Multiplanar CT image reconstructions and MIPs were obtained to evaluate the vascular anatomy. Carotid stenosis measurements (when applicable) are obtained utilizing NASCET criteria, using the distal internal carotid diameter as the denominator. Multiphase CT imaging of the brain was performed following IV bolus contrast injection. Subsequent parametric perfusion maps were calculated using RAPID software. CONTRAST:  149mL ISOVUE-370 IOPAMIDOL (ISOVUE-370) INJECTION 76% COMPARISON:  Brain MRI 11/09/2015 FINDINGS: CTA NECK FINDINGS Aortic arch: Standard 3 vessel aortic arch with mild atherosclerotic plaque. Widely patent arch vessel origins. Right carotid system: Patent with minimal plaque at the carotid bifurcation. No evidence of stenosis or dissection. Left carotid system: Patent with minimal plaque at the carotid bifurcation. No evidence of stenosis or dissection. Vertebral arteries: Patent without evidence of significant stenosis or dissection. Dominant right vertebral artery. Skeleton: Moderate cervical disc degeneration. No suspicious osseous lesion. Other neck: Focally prominent retropharyngeal fat from C2-C5 asymmetrically greater to the right and with mild mass effect on adjacent structures compatible with a benign lipoma and measuring 3.2 x 1.0 cm (transverse x AP). Right-sided external laryngocele. Upper chest: Bronchiectasis in the apical right upper lobe. Review of the MIP images confirms the above findings CTA HEAD FINDINGS Anterior circulation: The internal carotid arteries are patent from skull base to carotid termini with calcified plaque resulting in mild bilateral cavernous segment stenosis. ACAs and MCAs are patent with mild branch vessel irregularity but no evidence of proximal branch occlusion or significant proximal stenosis. No aneurysm is identified. Posterior circulation: The intracranial vertebral arteries are patent to the basilar with the right  being dominant. Distal V4 stenoses are mild on the right and moderate on the left. The right PICA and left AICA appear dominant. Patent SCA origins are seen bilaterally. The basilar artery is widely patent. There are right larger than left posterior communicating arteries with a fetal origin noted of the right PCA. Both PCAs are patent with branch vessel irregularity but no significant proximal stenosis. No aneurysm is identified. Venous sinuses: Patent. Anatomic variants: Fetal right PCA. Delayed phase: No abnormal enhancement. Review of the MIP images confirms the above findings CT Brain Perfusion Findings: CBF (<30%) Volume: 0 mL Perfusion (Tmax>6.0s) volume: 0 mL Mismatch Volume: 0 mL Infarction Location: None IMPRESSION: 1. No  emergent large vessel occlusion. 2. Minimal cervical carotid artery atherosclerosis without stenosis. 3. Mild right and moderate left V4 stenoses. 4. Mild bilateral intracranial ICA stenosis. 5. Negative CT perfusion imaging. 6. No evidence of intracranial metastatic disease. 7. Small retropharyngeal lipoma. 8.  Aortic Atherosclerosis (ICD10-I70.0). Preliminary findings of no large vessel occlusion were discussed with Dr. Rory Percy via telephone on 09/27/2018 at 1:05 p.m. Electronically Signed   By: Logan Bores M.D.   On: 09/27/2018 13:34   Ct Angio Neck W Or Wo Contrast  Result Date: 09/27/2018 CLINICAL DATA:  Aphasia.  Blurred vision.  History of breast cancer. EXAM: CT ANGIOGRAPHY HEAD AND NECK CT PERFUSION BRAIN TECHNIQUE: Multidetector CT imaging of the head and neck was performed using the standard protocol during bolus administration of intravenous contrast. Multiplanar CT image reconstructions and MIPs were obtained to evaluate the vascular anatomy. Carotid stenosis measurements (when applicable) are obtained utilizing NASCET criteria, using the distal internal carotid diameter as the denominator. Multiphase CT imaging of the brain was performed following IV bolus contrast  injection. Subsequent parametric perfusion maps were calculated using RAPID software. CONTRAST:  161mL ISOVUE-370 IOPAMIDOL (ISOVUE-370) INJECTION 76% COMPARISON:  Brain MRI 11/09/2015 FINDINGS: CTA NECK FINDINGS Aortic arch: Standard 3 vessel aortic arch with mild atherosclerotic plaque. Widely patent arch vessel origins. Right carotid system: Patent with minimal plaque at the carotid bifurcation. No evidence of stenosis or dissection. Left carotid system: Patent with minimal plaque at the carotid bifurcation. No evidence of stenosis or dissection. Vertebral arteries: Patent without evidence of significant stenosis or dissection. Dominant right vertebral artery. Skeleton: Moderate cervical disc degeneration. No suspicious osseous lesion. Other neck: Focally prominent retropharyngeal fat from C2-C5 asymmetrically greater to the right and with mild mass effect on adjacent structures compatible with a benign lipoma and measuring 3.2 x 1.0 cm (transverse x AP). Right-sided external laryngocele. Upper chest: Bronchiectasis in the apical right upper lobe. Review of the MIP images confirms the above findings CTA HEAD FINDINGS Anterior circulation: The internal carotid arteries are patent from skull base to carotid termini with calcified plaque resulting in mild bilateral cavernous segment stenosis. ACAs and MCAs are patent with mild branch vessel irregularity but no evidence of proximal branch occlusion or significant proximal stenosis. No aneurysm is identified. Posterior circulation: The intracranial vertebral arteries are patent to the basilar with the right being dominant. Distal V4 stenoses are mild on the right and moderate on the left. The right PICA and left AICA appear dominant. Patent SCA origins are seen bilaterally. The basilar artery is widely patent. There are right larger than left posterior communicating arteries with a fetal origin noted of the right PCA. Both PCAs are patent with branch vessel  irregularity but no significant proximal stenosis. No aneurysm is identified. Venous sinuses: Patent. Anatomic variants: Fetal right PCA. Delayed phase: No abnormal enhancement. Review of the MIP images confirms the above findings CT Brain Perfusion Findings: CBF (<30%) Volume: 0 mL Perfusion (Tmax>6.0s) volume: 0 mL Mismatch Volume: 0 mL Infarction Location: None IMPRESSION: 1. No emergent large vessel occlusion. 2. Minimal cervical carotid artery atherosclerosis without stenosis. 3. Mild right and moderate left V4 stenoses. 4. Mild bilateral intracranial ICA stenosis. 5. Negative CT perfusion imaging. 6. No evidence of intracranial metastatic disease. 7. Small retropharyngeal lipoma. 8.  Aortic Atherosclerosis (ICD10-I70.0). Preliminary findings of no large vessel occlusion were discussed with Dr. Rory Percy via telephone on 09/27/2018 at 1:05 p.m. Electronically Signed   By: Logan Bores M.D.   On: 09/27/2018 13:34  Ct Abdomen Pelvis W Contrast  Result Date: 09/27/2018 CLINICAL DATA:  Abdominal pain. Vomiting. History of ovarian cancer and right breast cancer. EXAM: CT ABDOMEN AND PELVIS WITH CONTRAST TECHNIQUE: Multidetector CT imaging of the abdomen and pelvis was performed using the standard protocol following bolus administration of intravenous contrast. CONTRAST:  1104mL OMNIPAQUE IOHEXOL 300 MG/ML  SOLN COMPARISON:  CT scan dated 05/20/2013 FINDINGS: Lower chest: There is new borderline cardiomegaly since the prior study of 2014. Aortic atherosclerosis. Progressive bronchiectasis at both lung bases with mucous plugs in some of the distal dilated bronchi. No consolidative infiltrates pleural effusions. Hepatobiliary: There are slightly dilated intra and extrahepatic bile ducts. Status post cholecystectomy. Common bile duct diameter is 10 mm, upper limits of normal for post cholecystectomy patient. No evidence of an ampullary mass. Pancreas: Unremarkable. No pancreatic ductal dilatation or surrounding  inflammatory changes. Spleen: Normal in size without focal abnormality. Adrenals/Urinary Tract: Adrenal glands are unremarkable. Kidneys are normal, without renal calculi, focal lesion, or hydronephrosis. Bladder is unremarkable. Stomach/Bowel: Stomach is within normal limits. Appendix appears normal. No evidence of bowel wall thickening, distention, or inflammatory changes. Vascular/Lymphatic: Aortic atherosclerosis. No enlarged abdominal or pelvic lymph nodes. Reproductive: Status post hysterectomy. No adnexal masses. Other: No abdominal wall hernia or abnormality. No abdominopelvic ascites. Musculoskeletal: No acute abnormalities. Degenerative disc and joint disease at L5-S1. 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 IMPRESSION: 1. Slight dilatation of the biliary tree, probably secondary to prior cholecystectomy. Patient's bilirubin is not elevated. 2. No acute abnormalities of the abdomen or pelvis. 3. Progressive bronchiectasis at both lung bases with mucous plugs in some of the distal dilated bronchi. Aortic Atherosclerosis (ICD10-I70.0). Electronically Signed   By: Lorriane Shire M.D.   On: 09/27/2018 14:42   Ct Cerebral Perfusion W Contrast  Result Date: 09/27/2018 CLINICAL DATA:  Aphasia.  Blurred vision.  History of breast cancer. EXAM: CT ANGIOGRAPHY HEAD AND NECK CT PERFUSION BRAIN TECHNIQUE: Multidetector CT imaging of the head and neck was performed using the standard protocol during bolus administration of intravenous contrast. Multiplanar CT image reconstructions and MIPs were obtained to evaluate the vascular anatomy. Carotid stenosis measurements (when applicable) are obtained utilizing NASCET criteria, using the distal internal carotid diameter as the denominator. Multiphase CT imaging of the brain was performed following IV bolus contrast injection. Subsequent parametric perfusion maps were calculated using RAPID software. CONTRAST:  167mL ISOVUE-370 IOPAMIDOL (ISOVUE-370) INJECTION 76%  COMPARISON:  Brain MRI 11/09/2015 FINDINGS: CTA NECK FINDINGS Aortic arch: Standard 3 vessel aortic arch with mild atherosclerotic plaque. Widely patent arch vessel origins. Right carotid system: Patent with minimal plaque at the carotid bifurcation. No evidence of stenosis or dissection. Left carotid system: Patent with minimal plaque at the carotid bifurcation. No evidence of stenosis or dissection. Vertebral arteries: Patent without evidence of significant stenosis or dissection. Dominant right vertebral artery. Skeleton: Moderate cervical disc degeneration. No suspicious osseous lesion. Other neck: Focally prominent retropharyngeal fat from C2-C5 asymmetrically greater to the right and with mild mass effect on adjacent structures compatible with a benign lipoma and measuring 3.2 x 1.0 cm (transverse x AP). Right-sided external laryngocele. Upper chest: Bronchiectasis in the apical right upper lobe. Review of the MIP images confirms the above findings CTA HEAD FINDINGS Anterior circulation: The internal carotid arteries are patent from skull base to carotid termini with calcified plaque resulting in mild bilateral cavernous segment stenosis. ACAs and MCAs are patent with mild branch vessel irregularity but no evidence of proximal branch occlusion or significant proximal stenosis. No  aneurysm is identified. Posterior circulation: The intracranial vertebral arteries are patent to the basilar with the right being dominant. Distal V4 stenoses are mild on the right and moderate on the left. The right PICA and left AICA appear dominant. Patent SCA origins are seen bilaterally. The basilar artery is widely patent. There are right larger than left posterior communicating arteries with a fetal origin noted of the right PCA. Both PCAs are patent with branch vessel irregularity but no significant proximal stenosis. No aneurysm is identified. Venous sinuses: Patent. Anatomic variants: Fetal right PCA. Delayed phase: No  abnormal enhancement. Review of the MIP images confirms the above findings CT Brain Perfusion Findings: CBF (<30%) Volume: 0 mL Perfusion (Tmax>6.0s) volume: 0 mL Mismatch Volume: 0 mL Infarction Location: None IMPRESSION: 1. No emergent large vessel occlusion. 2. Minimal cervical carotid artery atherosclerosis without stenosis. 3. Mild right and moderate left V4 stenoses. 4. Mild bilateral intracranial ICA stenosis. 5. Negative CT perfusion imaging. 6. No evidence of intracranial metastatic disease. 7. Small retropharyngeal lipoma. 8.  Aortic Atherosclerosis (ICD10-I70.0). Preliminary findings of no large vessel occlusion were discussed with Dr. Rory Percy via telephone on 09/27/2018 at 1:05 p.m. Electronically Signed   By: Logan Bores M.D.   On: 09/27/2018 13:34   Dg Chest Portable 1 View  Result Date: 09/27/2018 CLINICAL DATA:  Headache.  Vomiting. EXAM: PORTABLE CHEST 1 VIEW COMPARISON:  01/02/2018. FINDINGS: Mediastinum and hilar structures are normal. Heart size stable. Bibasilar pulmonary interstitial prominence with bronchiectasis noted. These findings are chronic. No pleural effusion or pneumothorax. Surgical clips left chest. IMPRESSION: Bibasilar chronic interstitial lung disease and bronchiectasis. Similar findings noted on multiple prior exams. Electronically Signed   By: Marcello Moores  Register   On: 09/27/2018 14:06   Ct Head Code Stroke Wo Contrast  Result Date: 09/27/2018 CLINICAL DATA:  Code stroke.  Slurred speech.  Blurred vision. EXAM: CT HEAD WITHOUT CONTRAST TECHNIQUE: Contiguous axial images were obtained from the base of the skull through the vertex without intravenous contrast. COMPARISON:  Brain MRI 11/09/2015 FINDINGS: Brain: There is no evidence of acute infarct, intracranial hemorrhage, mass, midline shift, or extra-axial fluid collection. The ventricles and sulci are normal. No significant white matter disease is evident for age. Vascular: Calcified atherosclerosis at the skull base. No  hyperdense vessel. Skull: No fracture. 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 Sinuses/Orbits: Evidence of chronic sinusitis. Mild right maxillary sinus mucosal thickening. No sinus fluid. Clear mastoid air cells. Bilateral cataract extraction. Other: None. ASPECTS [facility] Stroke Program Early CT Score) - Ganglionic level infarction (caudate, lentiform nuclei, internal capsule, insula, M1-M3 cortex): 7 - Supraganglionic infarction (M4-M6 cortex): 3 Total score (0-10 with 10 being normal): 10 IMPRESSION: 1. No evidence of acute intracranial abnormality. 2. ASPECTS is 10. These results were communicated to Dr. Rory Percy at 1:03 pm on 09/27/2018 by text page via the [facility] messaging system. Electronically Signed   By: Logan Bores M.D.   On: 09/27/2018 13:03    EKG: Independently reviewed.  Sinus rhythm with PACs and nonspecific ST-T wave abnormalities  Assessment/Plan Principal Problem:   Toxic metabolic encephalopathy Active Problems:   Stroke (HCC)   Acute bronchitis with bronchiectasis (HCC)   Essential hypertension   Thrombocytopenia (HCC)   Vomiting   Hypokalemia   History of left breast cancer   1.  Toxic metabolic encephalopathy: So far stroke work-up is unremarkable.  Patient was not felt to be a candidate for TPA.  We will continue to monitor.  She does  have some medical illnesses and has had a cough.  It is possible that she has a pneumonia that has not yet blossomed.  Although her white blood cell count is normal.  We will treat her for a presumed pneumonia that is complicated due to bronchiectasis with vancomycin and cefepime.  We will continue to monitor plan repeat noncontrast CT scan of the chest tomorrow to evaluate lung after hydration.  2.  Stroke: Highly suspect stroke.  MRI is pending.  Stroke team has consulted.  3.  Acute bronchitis with bronchiectasis: Start antibiotics as noted above.  4.  Essential hypertension: Hold blood pressure medications for  now to allow for permissive hypertension if there is in fact a stroke.  5.  Thrombocytopenia: This could be related to infection if the patient has infection will monitor closely.  6.  Vomiting: Patient had an episode of vomiting at home this could be due to a stroke but could also be due to irritation of the diaphragm from pneumonia we will continue to monitor and offer antiemetics.  7.  Hypokalemia: We will replete IV.  8.  History of left breast cancer noted  DVT prophylaxis: Lovenox Code Status: Full code Family Communication: Spoke with patient's daughter, her son, and her son-in-law all were present at the bedside. Disposition Plan: Likely skilled nursing facility given current condition in 3 to 4 days Consults called: *Neurology Dr. Malen Gauze Admission status: Inpatient   Lady Deutscher MD [facility] Pager [phone]  If 7PM-7AM, please contact night-coverage [email or web address] Password [facility]  09/27/2018, 4:57 PM

## 2018-09-27 NOTE — ED Notes (Signed)
Pt returned from imaging.

## 2018-09-27 NOTE — Progress Notes (Signed)
Pharmacy Antibiotic Note  Monique Watson is a 78 y.o. female admitted on 09/27/2018 with pneumonia.    Plan: Vanc 1750 mg x 1 then 750 mg q12h Cefepime 1g q8h Monitor renal fx cx vanc lvls prn  Height: 5\' 4"  (162.6 cm) Weight: 187 lb 6.3 oz (85 kg) IBW/kg (Calculated) : 54.7  Temp (24hrs), Avg:98.7 F (37.1 C), Min:98.7 F (37.1 C), Max:98.7 F (37.1 C)  Recent Labs  Lab 09/27/18 1250 09/27/18 1253  WBC 9.1  --   CREATININE 0.90 0.70    Estimated Creatinine Clearance: 60.1 mL/min (by C-G formula based on SCr of 0.7 mg/dL).    Allergies  Allergen Reactions  . Chlorhexidine Anaphylaxis, Hives, Shortness Of Breath and Itching    anaphylaxis  . Arimidex [Anastrozole] Other (See Comments)    Body aches per patient. Irven Baltimore, RN.  Marland Kitchen Diovan [Valsartan] Other (See Comments)    arthralgias  . Uncoded Nonscreenable Allergen     Blood pressure medication patient stated that she has a low tolerance for   Vancomycin 750 mg IV Q 12 hrs. Goal AUC 400-550. Expected AUC: 519 SCr used: 0.8  Levester Fresh, PharmD, BCPS, BCCCP Clinical Pharmacist [phone]  Please check AMION for all [place] numbers  09/27/2018 7:34 PM

## 2018-09-27 NOTE — Code Documentation (Addendum)
78 yo female coming from home where she lives with her family. Pt was reported to have a sudden onset of headache along with vomiting. EMS called and activated Code Stroke. Stroke Team met patient upon arrival to the ED. 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 Bilateral arm and leg movement decreased. Pt will not answer questions or speak unless moaning with movement. Some slurred speech when speaking "you hurt me". Exam is not focal and is more consistent with patient being inattentive. CT Head negative. CTA/CTP negative for LVO. Pt began to vomit while leaving CT. Cleaned and placed on the cardiac monitor. Pt is not a tPA candidate due to stroke not suspected and nonfocal exam. Family reports diarrhea and pneumonia in the last couple days and generally not doing well. EDP to continue with care. Handoff given Vicente Males, Therapist, sports.

## 2018-09-27 NOTE — ED Provider Notes (Signed)
[facility] EMERGENCY DEPARTMENT Provider Note   CSN: [id number] Arrival date & time: 09/27/18  1244     History   Chief Complaint No chief complaint on file.   HPI Monique Watson is a 78 y.o. female.  Patient presents with acute change in mental status, gen weakness, ?slurred speech/not speaking. Pt very limited historian - level 5 caveat. ?last normal unclear. On arrival to ED, pt with recurrent vomiting, emesis yellowish, and productive cough. No report of recent fevers. No preceding uri symptoms. No report of trauma/fall. Family indicates pt with no recent c/o pain or other symptoms at home. Pt arrived as Code stroke and neurology evaluated on arrival to ED.   The history is provided by the patient, a relative and the EMS personnel. The history is limited by the condition of the patient.    Past Medical History:  Diagnosis Date  . Anemia   . Arthritis   . Breast cancer ([place]) 09/29/2011  . Hypertension   . Ovarian cancer ([place]) 09/29/2011  . Pneumonia   . Radiation 01/14/2009-02/10/2009   left breast 45 Gy, boosted to 5 Gy    Patient Active Problem List   Diagnosis Date Noted  . 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 History of left breast cancer 10/20/2015  . Portacath in place 05/28/2015  . Anaphylaxis 05/28/2015  . 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 Breast cancer of lower-inner quadrant of right female breast ([place]) 04/20/2015  . Environmental and seasonal allergies 01/02/2015  . Hx of colonic polyps 01/02/2015  . Thrombocytopenia ([place]) 01/02/2015  . Past use of tobacco 01/02/2015  . Port catheter in place 01/02/2015  . 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 Pneumonia 10/08/2014  . Essential hypertension 10/08/2014  . CAP (community acquired pneumonia)   . Genetic testing 10/07/2014  . BRCA2 positive  10/07/2014  . DOE (dyspnea on exertion) 07/20/2014  . SOB (shortness of breath) 10/29/2011  . Pneumonitis due to fumes and vapors ([place]) 10/29/2011  . Acute bronchitis 10/29/2011  . Tobacco use disorder 10/29/2011  . Hypertension 10/29/2011  . Ovarian cancer ([place]) 09/29/2011    Past Surgical History:  Procedure Laterality Date  . ABDOMINAL HYSTERECTOMY    . APPENDECTOMY    . BLADDER SURGERY    . BREAST LUMPECTOMY    . BREAST LUMPECTOMY WITH RADIOACTIVE SEED LOCALIZATION Right 04/07/2015   Procedure: BREAST LUMPECTOMY WITH RADIOACTIVE SEED LOCALIZATION;  Surgeon: Erroll Luna, MD;  Location: [facility];  Service: General;  Laterality: Right;  . PORT-A-CATH REMOVAL N/A 07/13/2015   Procedure: REMOVAL PORT-A-CATH;  Surgeon: Erroll Luna, MD;  Location: [facility];  Service: General;  Laterality: N/A;  . TONSILLECTOMY       OB History   No obstetric history on file.      Home Medications    Prior to Admission medications   Medication Sig Start Date End Date Taking? Authorizing Provider  aspirin 81 MG tablet Take 81 mg by mouth daily.    [provider]  Cholecalciferol (VITAMIN D3) 2000 UNITS TABS Take 1 capsule by mouth daily.     [provider]  Omega-3 Fatty Acids (FISH OIL CONCENTRATE) 1000 MG CAPS Take 1,000 mg by mouth daily.     [provider]  oxybutynin (DITROPAN-XL) 10 MG 24 hr tablet Take 10 mg by mouth daily. 07/05/16  [provider]  Polyethylene Glycol 3350 (PEG 3350) POWD Take 17 g by mouth daily. 07/05/16   [provider]  potassium chloride (K-DUR,KLOR-CON) 10 MEQ tablet Take 10 mEq by mouth daily.    [provider]  Respiratory Therapy Supplies (FLUTTER) DEVI Use as directed 10/22/17   Tanda Rockers, MD  tamoxifen (NOLVADEX) 20 MG tablet Take 1 tablet (20 mg total) by mouth daily. 01/16/18   Nicholas Lose, MD  vitamin E (VITAMIN E) 400 UNIT capsule Take 400 Units by mouth 2  (two) times daily.    [provider]    Family History Family History  Problem Relation Age of Onset  . Ovarian cancer Mother 81  . Colon cancer Brother 26  . Breast cancer Paternal Aunt        6-7 with breast cancer; one bilateral, several premenopausal  . Pancreatitis Brother 59  . Colon cancer Paternal Aunt   . Cancer Cousin        NOS    Social History Social History   Tobacco Use  . Smoking status: Former Smoker    Packs/day: 1.00    Years: 58.00    Pack years: 58.00    Types: Cigarettes    Last attempt to quit: 07/19/2017    Years since quitting: 1.1  . Smokeless tobacco: Never Used  Substance Use Topics  . Alcohol use: Yes    Alcohol/week: 1.0 standard drinks    Types: 1 Shots of liquor per week    Comment: 11/29/15 cocktail on weekends  . Drug use: No     Allergies   Chlorhexidine; Arimidex [anastrozole]; Diovan [valsartan]; and Uncoded nonscreenable allergen   Review of Systems Review of Systems  Unable to perform ROS: Mental status change  Constitutional: Negative for fever.  Gastrointestinal: Positive for vomiting.  Neurological: Positive for weakness.  level 5 caveat - mental status change.    Physical Exam Updated Vital Signs BP (!) 190/85   Pulse 70   Resp 14   Ht 1.626 m ('5\' 4"' )   Wt 85 kg   SpO2 96%   BMI 32.17 kg/m   Physical Exam Vitals signs and nursing note reviewed.  Constitutional:      Appearance: Normal appearance. She is well-developed.  HENT:     Head: Atraumatic.     Nose: Nose normal.     Mouth/Throat:     Mouth: Mucous membranes are moist.  Eyes:     General: No scleral icterus.    Conjunctiva/sclera: Conjunctivae normal.     Pupils: Pupils are equal, round, and reactive to light.  Neck:     Musculoskeletal: Normal range of motion and neck supple. No neck rigidity or muscular tenderness.     Vascular: No carotid bruit.     Trachea: No tracheal deviation.  Cardiovascular:     Rate and Rhythm: Normal  rate and regular rhythm.     Pulses: Normal pulses.     Heart sounds: Normal heart sounds. No murmur. No friction rub. No gallop.   Pulmonary:     Effort: Pulmonary effort is normal. No respiratory distress.     Breath sounds: Normal breath sounds.     Comments: Upper resp congestion, coughing.  Abdominal:     General: Bowel sounds are normal. There is no distension.     Palpations: Abdomen is soft. There is no mass.     Tenderness: There is no abdominal tenderness. There is no guarding.  Genitourinary:    Comments:  No cva tenderness.  Musculoskeletal:        General: No swelling or tenderness.  Skin:    General: Skin is warm and dry.     Findings: No rash.  Neurological:     Mental Status: She is alert.     Comments: Pt awake/alert. Not speaking or trying to speak. Moves bil extremities purposefully.   Psychiatric:     Comments: Decreased alertness.       ED Treatments / Results  Labs (all labs ordered are listed, but only abnormal results are displayed) Results for orders placed or performed during the hospital encounter of 09/27/18  Protime-INR  Result Value Ref Range   Prothrombin Time 12.9 11.4 - 15.2 seconds   INR 0.98   APTT  Result Value Ref Range   aPTT 26 24 - 36 seconds  CBC  Result Value Ref Range   WBC 9.1 4.0 - 10.5 K/uL   RBC 4.23 3.87 - 5.11 MIL/uL   Hemoglobin 13.6 12.0 - 15.0 g/dL   HCT 41.6 36.0 - 46.0 %   MCV 98.3 80.0 - 100.0 fL   MCH 32.2 26.0 - 34.0 pg   MCHC 32.7 30.0 - 36.0 g/dL   RDW 12.9 11.5 - 15.5 %   Platelets 124 (L) 150 - 400 K/uL   nRBC 0.0 0.0 - 0.2 %  Differential  Result Value Ref Range   Neutrophils Relative % 40 %   Neutro Abs 3.6 1.7 - 7.7 K/uL   Lymphocytes Relative 48 %   Lymphs Abs 4.3 (H) 0.7 - 4.0 K/uL   Monocytes Relative 11 %   Monocytes Absolute 1.0 0.1 - 1.0 K/uL   Eosinophils Relative 1 %   Eosinophils Absolute 0.1 0.0 - 0.5 K/uL   Basophils Relative 0 %   Basophils Absolute 0.0 0.0 - 0.1 K/uL   Immature  Granulocytes 0 %   Abs Immature Granulocytes 0.03 0.00 - 0.07 K/uL  I-stat troponin, ED  Result Value Ref Range   Troponin i, poc 0.00 0.00 - 0.08 ng/mL   Comment 3          I-Stat Chem 8, ED  Result Value Ref Range   Sodium 142 135 - 145 mmol/L   Potassium 3.1 (L) 3.5 - 5.1 mmol/L   Chloride 109 98 - 111 mmol/L   BUN 12 8 - 23 mg/dL   Creatinine, Ser 0.70 0.44 - 1.00 mg/dL   Glucose, Bld 141 (H) 70 - 99 mg/dL   Calcium, Ion 1.03 (L) 1.15 - 1.40 mmol/L   TCO2 22 22 - 32 mmol/L   Hemoglobin 14.3 12.0 - 15.0 g/dL   HCT 42.0 36.0 - 46.0 %   Ct Angio Head W Or Wo Contrast  Result Date: 09/27/2018 CLINICAL DATA:  Aphasia.  Blurred vision.  History of breast cancer. EXAM: CT ANGIOGRAPHY HEAD AND NECK CT PERFUSION BRAIN TECHNIQUE: Multidetector CT imaging of the head and neck was performed using the standard protocol during bolus administration of intravenous contrast. Multiplanar CT image reconstructions and MIPs were obtained to evaluate the vascular anatomy. Carotid stenosis measurements (when applicable) are obtained utilizing NASCET criteria, using the distal internal carotid diameter as the denominator. Multiphase CT imaging of the brain was performed following IV bolus contrast injection. Subsequent parametric perfusion maps were calculated using RAPID software. CONTRAST:  156m ISOVUE-370 IOPAMIDOL (ISOVUE-370) INJECTION 76% COMPARISON:  Brain MRI 11/09/2015 FINDINGS: CTA NECK FINDINGS Aortic arch: Standard 3 vessel aortic arch with mild atherosclerotic plaque. Widely patent arch  vessel origins. Right carotid system: Patent with minimal plaque at the carotid bifurcation. No evidence of stenosis or dissection. Left carotid system: Patent with minimal plaque at the carotid bifurcation. No evidence of stenosis or dissection. Vertebral arteries: Patent without evidence of significant stenosis or dissection. Dominant right vertebral artery. Skeleton: Moderate cervical disc degeneration. No  suspicious osseous lesion. Other neck: Focally prominent retropharyngeal fat from C2-C5 asymmetrically greater to the right and with mild mass effect on adjacent structures compatible with a benign lipoma and measuring 3.2 x 1.0 cm (transverse x AP). Right-sided external laryngocele. Upper chest: Bronchiectasis in the apical right upper lobe. Review of the MIP images confirms the above findings CTA HEAD FINDINGS Anterior circulation: The internal carotid arteries are patent from skull base to carotid termini with calcified plaque resulting in mild bilateral cavernous segment stenosis. ACAs and MCAs are patent with mild branch vessel irregularity but no evidence of proximal branch occlusion or significant proximal stenosis. No aneurysm is identified. Posterior circulation: The intracranial vertebral arteries are patent to the basilar with the right being dominant. Distal V4 stenoses are mild on the right and moderate on the left. The right PICA and left AICA appear dominant. Patent SCA origins are seen bilaterally. The basilar artery is widely patent. There are right larger than left posterior communicating arteries with a fetal origin noted of the right PCA. Both PCAs are patent with branch vessel irregularity but no significant proximal stenosis. No aneurysm is identified. Venous sinuses: Patent. Anatomic variants: Fetal right PCA. Delayed phase: No abnormal enhancement. Review of the MIP images confirms the above findings CT Brain Perfusion Findings: CBF (<30%) Volume: 0 mL Perfusion (Tmax>6.0s) volume: 0 mL Mismatch Volume: 0 mL Infarction Location: None IMPRESSION: 1. No emergent large vessel occlusion. 2. Minimal cervical carotid artery atherosclerosis without stenosis. 3. Mild right and moderate left V4 stenoses. 4. Mild bilateral intracranial ICA stenosis. 5. Negative CT perfusion imaging. 6. No evidence of intracranial metastatic disease. 7. Small retropharyngeal lipoma. 8.  Aortic Atherosclerosis  (ICD10-I70.0). Preliminary findings of no large vessel occlusion were discussed with Dr. Rory Percy via telephone on 09/27/2018 at 1:05 p.m. Electronically Signed   By: Logan Bores M.D.   On: 09/27/2018 13:34   Ct Angio Neck W Or Wo Contrast  Result Date: 09/27/2018 CLINICAL DATA:  Aphasia.  Blurred vision.  History of breast cancer. EXAM: CT ANGIOGRAPHY HEAD AND NECK CT PERFUSION BRAIN TECHNIQUE: Multidetector CT imaging of the head and neck was performed using the standard protocol during bolus administration of intravenous contrast. Multiplanar CT image reconstructions and MIPs were obtained to evaluate the vascular anatomy. Carotid stenosis measurements (when applicable) are obtained utilizing NASCET criteria, using the distal internal carotid diameter as the denominator. Multiphase CT imaging of the brain was performed following IV bolus contrast injection. Subsequent parametric perfusion maps were calculated using RAPID software. CONTRAST:  123m ISOVUE-370 IOPAMIDOL (ISOVUE-370) INJECTION 76% COMPARISON:  Brain MRI 11/09/2015 FINDINGS: CTA NECK FINDINGS Aortic arch: Standard 3 vessel aortic arch with mild atherosclerotic plaque. Widely patent arch vessel origins. Right carotid system: Patent with minimal plaque at the carotid bifurcation. No evidence of stenosis or dissection. Left carotid system: Patent with minimal plaque at the carotid bifurcation. No evidence of stenosis or dissection. Vertebral arteries: Patent without evidence of significant stenosis or dissection. Dominant right vertebral artery. Skeleton: Moderate cervical disc degeneration. No suspicious osseous lesion. Other neck: Focally prominent retropharyngeal fat from C2-C5 asymmetrically greater to the right and with mild mass effect on adjacent structures compatible  with a benign lipoma and measuring 3.2 x 1.0 cm (transverse x AP). Right-sided external laryngocele. Upper chest: Bronchiectasis in the apical right upper lobe. Review of the MIP  images confirms the above findings CTA HEAD FINDINGS Anterior circulation: The internal carotid arteries are patent from skull base to carotid termini with calcified plaque resulting in mild bilateral cavernous segment stenosis. ACAs and MCAs are patent with mild branch vessel irregularity but no evidence of proximal branch occlusion or significant proximal stenosis. No aneurysm is identified. Posterior circulation: The intracranial vertebral arteries are patent to the basilar with the right being dominant. Distal V4 stenoses are mild on the right and moderate on the left. The right PICA and left AICA appear dominant. Patent SCA origins are seen bilaterally. The basilar artery is widely patent. There are right larger than left posterior communicating arteries with a fetal origin noted of the right PCA. Both PCAs are patent with branch vessel irregularity but no significant proximal stenosis. No aneurysm is identified. Venous sinuses: Patent. Anatomic variants: Fetal right PCA. Delayed phase: No abnormal enhancement. Review of the MIP images confirms the above findings CT Brain Perfusion Findings: CBF (<30%) Volume: 0 mL Perfusion (Tmax>6.0s) volume: 0 mL Mismatch Volume: 0 mL Infarction Location: None IMPRESSION: 1. No emergent large vessel occlusion. 2. Minimal cervical carotid artery atherosclerosis without stenosis. 3. Mild right and moderate left V4 stenoses. 4. Mild bilateral intracranial ICA stenosis. 5. Negative CT perfusion imaging. 6. No evidence of intracranial metastatic disease. 7. Small retropharyngeal lipoma. 8.  Aortic Atherosclerosis (ICD10-I70.0). Preliminary findings of no large vessel occlusion were discussed with Dr. Rory Percy via telephone on 09/27/2018 at 1:05 p.m. Electronically Signed   By: Logan Bores M.D.   On: 09/27/2018 13:34   Ct Cerebral Perfusion W Contrast  Result Date: 09/27/2018 CLINICAL DATA:  Aphasia.  Blurred vision.  History of breast cancer. EXAM: CT ANGIOGRAPHY HEAD AND NECK  CT PERFUSION BRAIN TECHNIQUE: Multidetector CT imaging of the head and neck was performed using the standard protocol during bolus administration of intravenous contrast. Multiplanar CT image reconstructions and MIPs were obtained to evaluate the vascular anatomy. Carotid stenosis measurements (when applicable) are obtained utilizing NASCET criteria, using the distal internal carotid diameter as the denominator. Multiphase CT imaging of the brain was performed following IV bolus contrast injection. Subsequent parametric perfusion maps were calculated using RAPID software. CONTRAST:  144m ISOVUE-370 IOPAMIDOL (ISOVUE-370) INJECTION 76% COMPARISON:  Brain MRI 11/09/2015 FINDINGS: CTA NECK FINDINGS Aortic arch: Standard 3 vessel aortic arch with mild atherosclerotic plaque. Widely patent arch vessel origins. Right carotid system: Patent with minimal plaque at the carotid bifurcation. No evidence of stenosis or dissection. Left carotid system: Patent with minimal plaque at the carotid bifurcation. No evidence of stenosis or dissection. Vertebral arteries: Patent without evidence of significant stenosis or dissection. Dominant right vertebral artery. Skeleton: Moderate cervical disc degeneration. No suspicious osseous lesion. Other neck: Focally prominent retropharyngeal fat from C2-C5 asymmetrically greater to the right and with mild mass effect on adjacent structures compatible with a benign lipoma and measuring 3.2 x 1.0 cm (transverse x AP). Right-sided external laryngocele. Upper chest: Bronchiectasis in the apical right upper lobe. Review of the MIP images confirms the above findings CTA HEAD FINDINGS Anterior circulation: The internal carotid arteries are patent from skull base to carotid termini with calcified plaque resulting in mild bilateral cavernous segment stenosis. ACAs and MCAs are patent with mild branch vessel irregularity but no evidence of proximal branch occlusion or significant proximal stenosis.  No aneurysm is identified. Posterior circulation: The intracranial vertebral arteries are patent to the basilar with the right being dominant. Distal V4 stenoses are mild on the right and moderate on the left. The right PICA and left AICA appear dominant. Patent SCA origins are seen bilaterally. The basilar artery is widely patent. There are right larger than left posterior communicating arteries with a fetal origin noted of the right PCA. Both PCAs are patent with branch vessel irregularity but no significant proximal stenosis. No aneurysm is identified. Venous sinuses: Patent. Anatomic variants: Fetal right PCA. Delayed phase: No abnormal enhancement. Review of the MIP images confirms the above findings CT Brain Perfusion Findings: CBF (<30%) Volume: 0 mL Perfusion (Tmax>6.0s) volume: 0 mL Mismatch Volume: 0 mL Infarction Location: None IMPRESSION: 1. No emergent large vessel occlusion. 2. Minimal cervical carotid artery atherosclerosis without stenosis. 3. Mild right and moderate left V4 stenoses. 4. Mild bilateral intracranial ICA stenosis. 5. Negative CT perfusion imaging. 6. No evidence of intracranial metastatic disease. 7. Small retropharyngeal lipoma. 8.  Aortic Atherosclerosis (ICD10-I70.0). Preliminary findings of no large vessel occlusion were discussed with Dr. Rory Percy via telephone on 09/27/2018 at 1:05 p.m. Electronically Signed   By: Logan Bores M.D.   On: 09/27/2018 13:34   Ct Head Code Stroke Wo Contrast  Result Date: 09/27/2018 CLINICAL DATA:  Code stroke.  Slurred speech.  Blurred vision. EXAM: CT HEAD WITHOUT CONTRAST TECHNIQUE: Contiguous axial images were obtained from the base of the skull through the vertex without intravenous contrast. COMPARISON:  Brain MRI 11/09/2015 FINDINGS: Brain: There is no evidence of acute infarct, intracranial hemorrhage, mass, midline shift, or extra-axial fluid collection. The ventricles and sulci are normal. No significant white matter disease is evident for  age. Vascular: Calcified atherosclerosis at the skull base. No hyperdense vessel. Skull: No fracture. 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 Sinuses/Orbits: Evidence of chronic sinusitis. Mild right maxillary sinus mucosal thickening. No sinus fluid. Clear mastoid air cells. Bilateral cataract extraction. Other: None. ASPECTS [facility] Stroke Program Early CT Score) - Ganglionic level infarction (caudate, lentiform nuclei, internal capsule, insula, M1-M3 cortex): 7 - Supraganglionic infarction (M4-M6 cortex): 3 Total score (0-10 with 10 being normal): 10 IMPRESSION: 1. No evidence of acute intracranial abnormality. 2. ASPECTS is 10. These results were communicated to Dr. Rory Percy at 1:03 pm on 09/27/2018 by text page via the [facility] messaging system. Electronically Signed   By: Logan Bores M.D.   On: 09/27/2018 13:03    EKG EKG Interpretation  Date/Time:  Friday September 27 2018 13:18:02 EST Ventricular Rate:  75 PR Interval:    QRS Duration: 98 QT Interval:  396 QTC Calculation: 443 R Axis:   -12 Text Interpretation:  Sinus rhythm Atrial premature complex Nonspecific ST abnormality Confirmed by Lajean Saver [phone]) on 09/27/2018 1:27:58 PM   Radiology Ct Angio Head W Or Wo Contrast  Result Date: 09/27/2018 CLINICAL DATA:  Aphasia.  Blurred vision.  History of breast cancer. EXAM: CT ANGIOGRAPHY HEAD AND NECK CT PERFUSION BRAIN TECHNIQUE: Multidetector CT imaging of the head and neck was performed using the standard protocol during bolus administration of intravenous contrast. Multiplanar CT image reconstructions and MIPs were obtained to evaluate the vascular anatomy. Carotid stenosis measurements (when applicable) are obtained utilizing NASCET criteria, using the distal internal carotid diameter as the denominator. Multiphase CT imaging of the brain was performed following IV bolus contrast injection. Subsequent parametric perfusion maps were calculated using RAPID software.  CONTRAST:  121m ISOVUE-370 IOPAMIDOL (  ISOVUE-370) INJECTION 76% COMPARISON:  Brain MRI 11/09/2015 FINDINGS: CTA NECK FINDINGS Aortic arch: Standard 3 vessel aortic arch with mild atherosclerotic plaque. Widely patent arch vessel origins. Right carotid system: Patent with minimal plaque at the carotid bifurcation. No evidence of stenosis or dissection. Left carotid system: Patent with minimal plaque at the carotid bifurcation. No evidence of stenosis or dissection. Vertebral arteries: Patent without evidence of significant stenosis or dissection. Dominant right vertebral artery. Skeleton: Moderate cervical disc degeneration. No suspicious osseous lesion. Other neck: Focally prominent retropharyngeal fat from C2-C5 asymmetrically greater to the right and with mild mass effect on adjacent structures compatible with a benign lipoma and measuring 3.2 x 1.0 cm (transverse x AP). Right-sided external laryngocele. Upper chest: Bronchiectasis in the apical right upper lobe. Review of the MIP images confirms the above findings CTA HEAD FINDINGS Anterior circulation: The internal carotid arteries are patent from skull base to carotid termini with calcified plaque resulting in mild bilateral cavernous segment stenosis. ACAs and MCAs are patent with mild branch vessel irregularity but no evidence of proximal branch occlusion or significant proximal stenosis. No aneurysm is identified. Posterior circulation: The intracranial vertebral arteries are patent to the basilar with the right being dominant. Distal V4 stenoses are mild on the right and moderate on the left. The right PICA and left AICA appear dominant. Patent SCA origins are seen bilaterally. The basilar artery is widely patent. There are right larger than left posterior communicating arteries with a fetal origin noted of the right PCA. Both PCAs are patent with branch vessel irregularity but no significant proximal stenosis. No aneurysm is identified. Venous sinuses:  Patent. Anatomic variants: Fetal right PCA. Delayed phase: No abnormal enhancement. Review of the MIP images confirms the above findings CT Brain Perfusion Findings: CBF (<30%) Volume: 0 mL Perfusion (Tmax>6.0s) volume: 0 mL Mismatch Volume: 0 mL Infarction Location: None IMPRESSION: 1. No emergent large vessel occlusion. 2. Minimal cervical carotid artery atherosclerosis without stenosis. 3. Mild right and moderate left V4 stenoses. 4. Mild bilateral intracranial ICA stenosis. 5. Negative CT perfusion imaging. 6. No evidence of intracranial metastatic disease. 7. Small retropharyngeal lipoma. 8.  Aortic Atherosclerosis (ICD10-I70.0). Preliminary findings of no large vessel occlusion were discussed with Dr. Rory Percy via telephone on 09/27/2018 at 1:05 p.m. Electronically Signed   By: Logan Bores M.D.   On: 09/27/2018 13:34   Ct Angio Neck W Or Wo Contrast  Result Date: 09/27/2018 CLINICAL DATA:  Aphasia.  Blurred vision.  History of breast cancer. EXAM: CT ANGIOGRAPHY HEAD AND NECK CT PERFUSION BRAIN TECHNIQUE: Multidetector CT imaging of the head and neck was performed using the standard protocol during bolus administration of intravenous contrast. Multiplanar CT image reconstructions and MIPs were obtained to evaluate the vascular anatomy. Carotid stenosis measurements (when applicable) are obtained utilizing NASCET criteria, using the distal internal carotid diameter as the denominator. Multiphase CT imaging of the brain was performed following IV bolus contrast injection. Subsequent parametric perfusion maps were calculated using RAPID software. CONTRAST:  182m ISOVUE-370 IOPAMIDOL (ISOVUE-370) INJECTION 76% COMPARISON:  Brain MRI 11/09/2015 FINDINGS: CTA NECK FINDINGS Aortic arch: Standard 3 vessel aortic arch with mild atherosclerotic plaque. Widely patent arch vessel origins. Right carotid system: Patent with minimal plaque at the carotid bifurcation. No evidence of stenosis or dissection. Left carotid  system: Patent with minimal plaque at the carotid bifurcation. No evidence of stenosis or dissection. Vertebral arteries: Patent without evidence of significant stenosis or dissection. Dominant right vertebral artery. Skeleton: Moderate cervical disc degeneration.  No suspicious osseous lesion. Other neck: Focally prominent retropharyngeal fat from C2-C5 asymmetrically greater to the right and with mild mass effect on adjacent structures compatible with a benign lipoma and measuring 3.2 x 1.0 cm (transverse x AP). Right-sided external laryngocele. Upper chest: Bronchiectasis in the apical right upper lobe. Review of the MIP images confirms the above findings CTA HEAD FINDINGS Anterior circulation: The internal carotid arteries are patent from skull base to carotid termini with calcified plaque resulting in mild bilateral cavernous segment stenosis. ACAs and MCAs are patent with mild branch vessel irregularity but no evidence of proximal branch occlusion or significant proximal stenosis. No aneurysm is identified. Posterior circulation: The intracranial vertebral arteries are patent to the basilar with the right being dominant. Distal V4 stenoses are mild on the right and moderate on the left. The right PICA and left AICA appear dominant. Patent SCA origins are seen bilaterally. The basilar artery is widely patent. There are right larger than left posterior communicating arteries with a fetal origin noted of the right PCA. Both PCAs are patent with branch vessel irregularity but no significant proximal stenosis. No aneurysm is identified. Venous sinuses: Patent. Anatomic variants: Fetal right PCA. Delayed phase: No abnormal enhancement. Review of the MIP images confirms the above findings CT Brain Perfusion Findings: CBF (<30%) Volume: 0 mL Perfusion (Tmax>6.0s) volume: 0 mL Mismatch Volume: 0 mL Infarction Location: None IMPRESSION: 1. No emergent large vessel occlusion. 2. Minimal cervical carotid artery  atherosclerosis without stenosis. 3. Mild right and moderate left V4 stenoses. 4. Mild bilateral intracranial ICA stenosis. 5. Negative CT perfusion imaging. 6. No evidence of intracranial metastatic disease. 7. Small retropharyngeal lipoma. 8.  Aortic Atherosclerosis (ICD10-I70.0). Preliminary findings of no large vessel occlusion were discussed with Dr. Rory Percy via telephone on 09/27/2018 at 1:05 p.m. Electronically Signed   By: Logan Bores M.D.   On: 09/27/2018 13:34   Ct Abdomen Pelvis W Contrast  Result Date: 09/27/2018 CLINICAL DATA:  Abdominal pain. Vomiting. History of ovarian cancer and right breast cancer. EXAM: CT ABDOMEN AND PELVIS WITH CONTRAST TECHNIQUE: Multidetector CT imaging of the abdomen and pelvis was performed using the standard protocol following bolus administration of intravenous contrast. CONTRAST:  154m OMNIPAQUE IOHEXOL 300 MG/ML  SOLN COMPARISON:  CT scan dated 05/20/2013 FINDINGS: Lower chest: There is new borderline cardiomegaly since the prior study of 2014. Aortic atherosclerosis. Progressive bronchiectasis at both lung bases with mucous plugs in some of the distal dilated bronchi. No consolidative infiltrates pleural effusions. Hepatobiliary: There are slightly dilated intra and extrahepatic bile ducts. Status post cholecystectomy. Common bile duct diameter is 10 mm, upper limits of normal for post cholecystectomy patient. No evidence of an ampullary mass. Pancreas: Unremarkable. No pancreatic ductal dilatation or surrounding inflammatory changes. Spleen: Normal in size without focal abnormality. Adrenals/Urinary Tract: Adrenal glands are unremarkable. Kidneys are normal, without renal calculi, focal lesion, or hydronephrosis. Bladder is unremarkable. Stomach/Bowel: Stomach is within normal limits. Appendix appears normal. No evidence of bowel wall thickening, distention, or inflammatory changes. Vascular/Lymphatic: Aortic atherosclerosis. No enlarged abdominal or pelvic lymph  nodes. Reproductive: Status post hysterectomy. No adnexal masses. Other: No abdominal wall hernia or abnormality. No abdominopelvic ascites. Musculoskeletal: No acute abnormalities. Degenerative disc and joint disease at L5-S1. 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 IMPRESSION: 1. Slight dilatation of the biliary tree, probably secondary to prior cholecystectomy. Patient's bilirubin is not elevated. 2. No acute abnormalities of the abdomen or pelvis. 3. Progressive bronchiectasis at both lung bases with mucous plugs in some  of the distal dilated bronchi. Aortic Atherosclerosis (ICD10-I70.0). Electronically Signed   By: Lorriane Shire M.D.   On: 09/27/2018 14:42   Ct Cerebral Perfusion W Contrast  Result Date: 09/27/2018 CLINICAL DATA:  Aphasia.  Blurred vision.  History of breast cancer. EXAM: CT ANGIOGRAPHY HEAD AND NECK CT PERFUSION BRAIN TECHNIQUE: Multidetector CT imaging of the head and neck was performed using the standard protocol during bolus administration of intravenous contrast. Multiplanar CT image reconstructions and MIPs were obtained to evaluate the vascular anatomy. Carotid stenosis measurements (when applicable) are obtained utilizing NASCET criteria, using the distal internal carotid diameter as the denominator. Multiphase CT imaging of the brain was performed following IV bolus contrast injection. Subsequent parametric perfusion maps were calculated using RAPID software. CONTRAST:  127m ISOVUE-370 IOPAMIDOL (ISOVUE-370) INJECTION 76% COMPARISON:  Brain MRI 11/09/2015 FINDINGS: CTA NECK FINDINGS Aortic arch: Standard 3 vessel aortic arch with mild atherosclerotic plaque. Widely patent arch vessel origins. Right carotid system: Patent with minimal plaque at the carotid bifurcation. No evidence of stenosis or dissection. Left carotid system: Patent with minimal plaque at the carotid bifurcation. No evidence of stenosis or dissection. Vertebral arteries: Patent without evidence of significant  stenosis or dissection. Dominant right vertebral artery. Skeleton: Moderate cervical disc degeneration. No suspicious osseous lesion. Other neck: Focally prominent retropharyngeal fat from C2-C5 asymmetrically greater to the right and with mild mass effect on adjacent structures compatible with a benign lipoma and measuring 3.2 x 1.0 cm (transverse x AP). Right-sided external laryngocele. Upper chest: Bronchiectasis in the apical right upper lobe. Review of the MIP images confirms the above findings CTA HEAD FINDINGS Anterior circulation: The internal carotid arteries are patent from skull base to carotid termini with calcified plaque resulting in mild bilateral cavernous segment stenosis. ACAs and MCAs are patent with mild branch vessel irregularity but no evidence of proximal branch occlusion or significant proximal stenosis. No aneurysm is identified. Posterior circulation: The intracranial vertebral arteries are patent to the basilar with the right being dominant. Distal V4 stenoses are mild on the right and moderate on the left. The right PICA and left AICA appear dominant. Patent SCA origins are seen bilaterally. The basilar artery is widely patent. There are right larger than left posterior communicating arteries with a fetal origin noted of the right PCA. Both PCAs are patent with branch vessel irregularity but no significant proximal stenosis. No aneurysm is identified. Venous sinuses: Patent. Anatomic variants: Fetal right PCA. Delayed phase: No abnormal enhancement. Review of the MIP images confirms the above findings CT Brain Perfusion Findings: CBF (<30%) Volume: 0 mL Perfusion (Tmax>6.0s) volume: 0 mL Mismatch Volume: 0 mL Infarction Location: None IMPRESSION: 1. No emergent large vessel occlusion. 2. Minimal cervical carotid artery atherosclerosis without stenosis. 3. Mild right and moderate left V4 stenoses. 4. Mild bilateral intracranial ICA stenosis. 5. Negative CT perfusion imaging. 6. No evidence  of intracranial metastatic disease. 7. Small retropharyngeal lipoma. 8.  Aortic Atherosclerosis (ICD10-I70.0). Preliminary findings of no large vessel occlusion were discussed with Dr. ARory Percyvia telephone on 09/27/2018 at 1:05 p.m. Electronically Signed   By: ALogan BoresM.D.   On: 09/27/2018 13:34   Dg Chest Portable 1 View  Result Date: 09/27/2018 CLINICAL DATA:  Headache.  Vomiting. EXAM: PORTABLE CHEST 1 VIEW COMPARISON:  01/02/2018. FINDINGS: Mediastinum and hilar structures are normal. Heart size stable. Bibasilar pulmonary interstitial prominence with bronchiectasis noted. These findings are chronic. No pleural effusion or pneumothorax. Surgical clips left chest. 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 Similar findings noted on multiple prior exams. Electronically Signed   By: Marcello Moores  Register   On: 09/27/2018 14:06   Ct Head Code Stroke Wo Contrast  Result Date: 09/27/2018 CLINICAL DATA:  Code stroke.  Slurred speech.  Blurred vision. EXAM: CT HEAD WITHOUT CONTRAST TECHNIQUE: Contiguous axial images were obtained from the base of the skull through the vertex without intravenous contrast. COMPARISON:  Brain MRI 11/09/2015 FINDINGS: Brain: There is no evidence of acute infarct, intracranial hemorrhage, mass, midline shift, or extra-axial fluid collection. The ventricles and sulci are normal. No significant white matter disease is evident for age. Vascular: Calcified atherosclerosis at the skull base. No hyperdense vessel. Skull: No fracture. 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 Sinuses/Orbits: Evidence of chronic sinusitis. Mild right maxillary sinus mucosal thickening. No sinus fluid. Clear mastoid air cells. Bilateral cataract extraction. Other: None. ASPECTS A [facility] Stroke Program Early CT Score) - Ganglionic level infarction (caudate, lentiform nuclei, internal capsule, insula, M1-M3 cortex): 7 - Supraganglionic infarction (M4-M6  cortex): 3 Total score (0-10 with 10 being normal): 10 IMPRESSION: 1. No evidence of acute intracranial abnormality. 2. ASPECTS is 10. These results were communicated to Dr. Rory Percy at 1:03 pm on 09/27/2018 by text page via the [facility] messaging system. Electronically Signed   By: Logan Bores M.D.   On: 09/27/2018 13:03    Procedures Procedures (including critical care time)  Medications Ordered in ED Medications  ondansetron (ZOFRAN) injection 4 mg (has no administration in time range)  metoCLOPramide (REGLAN) injection 10 mg (has no administration in time range)  iopamidol (ISOVUE-370) 76 % injection 100 mL (100 mLs Intravenous Contrast Given 09/27/18 1311)     Initial Impression / Assessment and Plan / ED Course  I have reviewed the triage vital signs and the nursing notes.  Pertinent labs & imaging results that were available during my care of the patient were reviewed by me and considered in my medical decision making (see chart for details).  Pt arrived as code stroke. Neurology indicates they plan to get mri, but ct/cta okay.  Pt with recurrent vomiting. Ns bolus. zofran iv. abd soft, ?mid to lower abd tenderness. Ct.   Labs reviewed - k low. kcl iv.   Ct abd reviewed - neg for acute process, no sbo.   Given weakness, altered mental status, code cva with mri pending, and now recurrent nv, will consult hospitalists team for admission.     Final Clinical Impressions(s) / ED Diagnoses   Final diagnoses:  None    ED Discharge Orders    None       Lajean Saver, MD 09/27/18 1454

## 2018-09-27 NOTE — ED Notes (Signed)
Attempted report 

## 2018-09-28 ENCOUNTER — Inpatient Hospital Stay (HOSPITAL_COMMUNITY): Payer: Medicare Other

## 2018-09-28 ENCOUNTER — Encounter (HOSPITAL_COMMUNITY): Payer: Medicare Other

## 2018-09-28 DIAGNOSIS — J189 Pneumonia, unspecified organism: Secondary | ICD-10-CM | POA: Diagnosis not present

## 2018-09-28 DIAGNOSIS — J209 Acute bronchitis, unspecified: Secondary | ICD-10-CM | POA: Diagnosis not present

## 2018-09-28 DIAGNOSIS — E876 Hypokalemia: Secondary | ICD-10-CM

## 2018-09-28 DIAGNOSIS — J470 Bronchiectasis with acute lower respiratory infection: Secondary | ICD-10-CM | POA: Diagnosis not present

## 2018-09-28 DIAGNOSIS — H538 Other visual disturbances: Secondary | ICD-10-CM | POA: Diagnosis not present

## 2018-09-28 DIAGNOSIS — J849 Interstitial pulmonary disease, unspecified: Secondary | ICD-10-CM | POA: Diagnosis not present

## 2018-09-28 DIAGNOSIS — G92 Toxic encephalopathy: Principal | ICD-10-CM

## 2018-09-28 DIAGNOSIS — R05 Cough: Secondary | ICD-10-CM | POA: Diagnosis not present

## 2018-09-28 DIAGNOSIS — R2689 Other abnormalities of gait and mobility: Secondary | ICD-10-CM | POA: Diagnosis not present

## 2018-09-28 DIAGNOSIS — R55 Syncope and collapse: Secondary | ICD-10-CM | POA: Diagnosis not present

## 2018-09-28 DIAGNOSIS — I10 Essential (primary) hypertension: Secondary | ICD-10-CM | POA: Diagnosis not present

## 2018-09-28 DIAGNOSIS — R2681 Unsteadiness on feet: Secondary | ICD-10-CM | POA: Diagnosis not present

## 2018-09-28 DIAGNOSIS — R410 Disorientation, unspecified: Secondary | ICD-10-CM | POA: Diagnosis present

## 2018-09-28 LAB — RAPID URINE DRUG SCREEN, HOSP PERFORMED
Amphetamines: NOT DETECTED
Barbiturates: NOT DETECTED
Benzodiazepines: NOT DETECTED
Cocaine: NOT DETECTED
Opiates: NOT DETECTED
Tetrahydrocannabinol: POSITIVE — AB

## 2018-09-28 LAB — LIPID PANEL
Cholesterol: 140 mg/dL (ref 0–200)
HDL: 62 mg/dL (ref >40)
LDL Cholesterol: 62 mg/dL (ref 0–99)
Total CHOL/HDL Ratio: 2.3 RATIO
Triglycerides: 80 mg/dL (ref <150)
VLDL: 16 mg/dL (ref 0–40)

## 2018-09-28 LAB — ECHOCARDIOGRAM COMPLETE
Height: 66 in
Weight: 2857.16 oz

## 2018-09-28 LAB — MRSA PCR SCREENING: MRSA by PCR: NEGATIVE

## 2018-09-28 LAB — HEMOGLOBIN A1C
Hgb A1c MFr Bld: 4.7 % — ABNORMAL LOW (ref 4.8–5.6)
Mean Plasma Glucose: 88.19 mg/dL

## 2018-09-28 LAB — HIV ANTIBODY (ROUTINE TESTING W REFLEX): HIV Screen 4th Generation wRfx: NONREACTIVE

## 2018-09-28 LAB — PROCALCITONIN: Procalcitonin: <0.1 ng/mL

## 2018-09-28 LAB — STREP PNEUMONIAE URINARY ANTIGEN: Strep Pneumo Urinary Antigen: NEGATIVE

## 2018-09-28 MED ORDER — AZITHROMYCIN 250 MG PO TABS: ORAL_TABLET | ORAL | 0 refills | Status: AC

## 2018-09-28 MED ORDER — MIRABEGRON ER 25 MG PO TB24
50.0000 mg | ORAL_TABLET | Freq: Every day | ORAL | Status: DC
  Administered 2018-09-28: 50 mg via ORAL
  Filled 2018-09-28: qty 2

## 2018-09-28 NOTE — Discharge Summary (Addendum)
Physician Discharge Summary  Monique Watson ZOX:[id number] DOB: Apr 22, 1941 DOA: 09/27/2018  PCP: [name], MD  Admit date: 09/27/2018 Discharge date: 09/28/2018  Admitted From: Home Disposition: Home  Recommendations for Outpatient Follow-up:  1. Follow up with PCP in 1 week 2. Please obtain BMP/CBC in one week 3. Please follow up on the following pending results: None  Home Health: Outpatient PT Equipment/Devices: None  Discharge Condition: Stable CODE STATUS: Full code Diet recommendation: Heart healthy diet   Brief/Interim Summary:  Admission HPI written by Lady Deutscher, MD   Chief Complaint: Acute weakness and inability to speak  HPI: Monique Watson is a 78 y.o. 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  Last known normal was 11:45 AM this morning when patient was making breakfast she became wobbly according to her son who was present and witnessed the event.  Her to becoming wobbly she was having some coughing but she often coughs due to her bronchiectasis.  She was speaking until she left the house and then in the ambulance developed a loss of consciousness and 1 presented here was unable to give any history and had significant decline in her neurological status.  Initially she is awake and nonverbal and not following any commands she had a left-sided weakness and was not being the left side at all.  As the day progressed the patient has been able to move her left side she even told me she was cold when I was helping the aide change her sheets and she expressed SI of relief when I turned the overhead lights out. In the ED she obtained a  stat CT scan and a stat CT angiogram of the head and it was unremarkable MRI is pending read she was referred to medicine for further evaluation and management of stroke versus toxic metabolic encephalopathy due to possible pneumonia.   Hospital course:  Metabolic encephalopathy Presumed toxic,  However, no source. This may be secondary to hypoxia, honestly. Oxygen low 90s with ambulation. CT chest without evidence of infection. CT head without acute abnormalities. Transthoracic Echocardiogram without significant valve disease. No events on telemetry. Orthostatic vitals normal.  Bronchiectasis Acute bronchitis Patient with mild cough today. No obvious infection but evidence of inflammation. Will discharge on Azithromycin.  Essential hypertension Not on antihypertensives  Thrombocytopenia Chronic and stable.  Vomiting No recurrent episodes.  Hypokalemia Given supplementation.   Discharge Diagnoses:  Principal Problem:   Toxic metabolic encephalopathy Active Problems:   Acute bronchitis with bronchiectasis (HCC)   Essential hypertension   Thrombocytopenia (HCC)   History of left breast cancer   Vomiting   Hypokalemia   Discharge Instructions   Allergies as of 09/28/2018      Reactions   Chlorhexidine Anaphylaxis, Hives, Shortness Of Breath, Itching   anaphylaxis   Arimidex [anastrozole] Other (See Comments)   Body aches per patient. Irven Baltimore, RN.   Diovan [valsartan] Other (See Comments)   arthralgias   Uncoded Nonscreenable Allergen    Blood pressure medication patient stated that she has a low tolerance for      Medication List    TAKE these medications   aspirin 81 MG tablet Take 81 mg by mouth daily.   azithromycin 250 MG tablet Commonly  known as:  ZITHROMAX Take 2 tablets (500 mg total) by mouth daily for 1 day, THEN 1 tablet (250 mg total) daily for 4 days. Take 1 tablet daily for 3 days.. Start taking on:  September 28, 2018   FISH OIL CONCENTRATE  1000 MG Caps Take 1,000 mg by mouth daily.   FLUTTER Devi Use as directed   MYRBETRIQ 50 MG Tb24 tablet Generic drug:  mirabegron ER Take 50 mg by mouth daily.   PEG 3350 Powd Take 17 g by mouth daily.   potassium chloride 10 MEQ tablet Commonly known as:  K-DUR,KLOR-CON Take 10 mEq by mouth daily.   tamoxifen 20 MG tablet Commonly known as:  NOLVADEX Take 1 tablet (20 mg total) by mouth daily.   Vitamin D3 50 MCG (2000 UT) Tabs Take 1 capsule by mouth daily.   vitamin E 400 UNIT capsule Generic drug:  vitamin E Take 400 Units by mouth 2 (two) times daily.       Allergies  Allergen Reactions  . Chlorhexidine Anaphylaxis, Hives, Shortness Of Breath and Itching    anaphylaxis  . Arimidex [Anastrozole] Other (See Comments)    Body aches per patient. Irven Baltimore, RN.  Marland Kitchen Diovan [Valsartan] Other (See Comments)    arthralgias  . Uncoded Nonscreenable Allergen     Blood pressure medication patient stated that she has a low tolerance for    Consultations:  Neurology   Procedures/Studies: Ct Angio Head W Or Wo Contrast  Result Date: 09/27/2018 CLINICAL DATA:  Aphasia.  Blurred vision.  History of breast cancer. EXAM: CT ANGIOGRAPHY HEAD AND NECK CT PERFUSION BRAIN TECHNIQUE: Multidetector CT imaging of the head and neck was performed using the standard protocol during bolus administration of intravenous contrast. Multiplanar CT image reconstructions and MIPs were obtained to evaluate the vascular anatomy. Carotid stenosis measurements (when applicable) are obtained utilizing NASCET criteria, using the distal internal carotid diameter as the denominator. Multiphase CT imaging of the brain was performed following IV bolus contrast injection. Subsequent parametric perfusion maps were calculated using RAPID software. CONTRAST:  153mL ISOVUE-370 IOPAMIDOL (ISOVUE-370) INJECTION 76% COMPARISON:  Brain MRI 11/09/2015 FINDINGS: CTA NECK FINDINGS Aortic arch: Standard 3 vessel  aortic arch with mild atherosclerotic plaque. Widely patent arch vessel origins. Right carotid system: Patent with minimal plaque at the carotid bifurcation. No evidence of stenosis or dissection. Left carotid system: Patent with minimal plaque at the carotid bifurcation. No evidence of stenosis or dissection. Vertebral arteries: Patent without evidence of significant stenosis or dissection. Dominant right vertebral artery. Skeleton: Moderate cervical disc degeneration. No suspicious osseous lesion. Other neck: Focally prominent retropharyngeal fat from C2-C5 asymmetrically greater to the right and with mild mass effect on adjacent structures compatible with a benign lipoma and measuring 3.2 x 1.0 cm (transverse x AP). Right-sided external laryngocele. Upper chest: Bronchiectasis in the apical right upper lobe. Review of the MIP images confirms the above findings CTA HEAD FINDINGS Anterior circulation: The internal carotid arteries are patent from skull base to carotid termini with calcified plaque resulting in mild bilateral cavernous segment stenosis. ACAs and MCAs are patent with mild branch vessel irregularity but no evidence of proximal branch occlusion or significant proximal stenosis. No aneurysm is identified. Posterior circulation: The intracranial vertebral arteries are patent to the basilar with the right being dominant. Distal V4 stenoses are mild on the right and moderate on the left. The right PICA and left AICA appear dominant. Patent SCA origins are seen bilaterally. The basilar artery is  widely patent. There are right larger than left posterior communicating arteries with a fetal origin noted of the right PCA. Both PCAs are patent with branch vessel irregularity but no significant proximal stenosis. No aneurysm is identified. Venous sinuses: Patent. Anatomic variants: Fetal right PCA. Delayed phase: No abnormal enhancement. Review of the MIP images confirms the above findings CT Brain Perfusion  Findings: CBF (<30%) Volume: 0 mL Perfusion (Tmax>6.0s) volume: 0 mL Mismatch Volume: 0 mL Infarction Location: None IMPRESSION: 1. No emergent large vessel occlusion. 2. Minimal cervical carotid artery atherosclerosis without stenosis. 3. Mild right and moderate left V4 stenoses. 4. Mild bilateral intracranial ICA stenosis. 5. Negative CT perfusion imaging. 6. No evidence of intracranial metastatic disease. 7. Small retropharyngeal lipoma. 8.  Aortic Atherosclerosis (ICD10-I70.0). Preliminary findings of no large vessel occlusion were discussed with Dr. Rory Percy via telephone on 09/27/2018 at 1:05 p.m. Electronically Signed   By: Logan Bores M.D.   On: 09/27/2018 13:34   Ct Angio Neck W Or Wo Contrast  Result Date: 09/27/2018 CLINICAL DATA:  Aphasia.  Blurred vision.  History of breast cancer. EXAM: CT ANGIOGRAPHY HEAD AND NECK CT PERFUSION BRAIN TECHNIQUE: Multidetector CT imaging of the head and neck was performed using the standard protocol during bolus administration of intravenous contrast. Multiplanar CT image reconstructions and MIPs were obtained to evaluate the vascular anatomy. Carotid stenosis measurements (when applicable) are obtained utilizing NASCET criteria, using the distal internal carotid diameter as the denominator. Multiphase CT imaging of the brain was performed following IV bolus contrast injection. Subsequent parametric perfusion maps were calculated using RAPID software. CONTRAST:  15mL ISOVUE-370 IOPAMIDOL (ISOVUE-370) INJECTION 76% COMPARISON:  Brain MRI 11/09/2015 FINDINGS: CTA NECK FINDINGS Aortic arch: Standard 3 vessel aortic arch with mild atherosclerotic plaque. Widely patent arch vessel origins. Right carotid system: Patent with minimal plaque at the carotid bifurcation. No evidence of stenosis or dissection. Left carotid system: Patent with minimal plaque at the carotid bifurcation. No evidence of stenosis or dissection. Vertebral arteries: Patent without evidence of  significant stenosis or dissection. Dominant right vertebral artery. Skeleton: Moderate cervical disc degeneration. No suspicious osseous lesion. Other neck: Focally prominent retropharyngeal fat from C2-C5 asymmetrically greater to the right and with mild mass effect on adjacent structures compatible with a benign lipoma and measuring 3.2 x 1.0 cm (transverse x AP). Right-sided external laryngocele. Upper chest: Bronchiectasis in the apical right upper lobe. Review of the MIP images confirms the above findings CTA HEAD FINDINGS Anterior circulation: The internal carotid arteries are patent from skull base to carotid termini with calcified plaque resulting in mild bilateral cavernous segment stenosis. ACAs and MCAs are patent with mild branch vessel irregularity but no evidence of proximal branch occlusion or significant proximal stenosis. No aneurysm is identified. Posterior circulation: The intracranial vertebral arteries are patent to the basilar with the right being dominant. Distal V4 stenoses are mild on the right and moderate on the left. The right PICA and left AICA appear dominant. Patent SCA origins are seen bilaterally. The basilar artery is widely patent. There are right larger than left posterior communicating arteries with a fetal origin noted of the right PCA. Both PCAs are patent with branch vessel irregularity but no significant proximal stenosis. No aneurysm is identified. Venous sinuses: Patent. Anatomic variants: Fetal right PCA. Delayed phase: No abnormal enhancement. Review of the MIP images confirms the above findings CT Brain Perfusion Findings: CBF (<30%) Volume: 0 mL Perfusion (Tmax>6.0s) volume: 0 mL Mismatch Volume: 0 mL Infarction Location: None IMPRESSION:  1. No emergent large vessel occlusion. 2. Minimal cervical carotid artery atherosclerosis without stenosis. 3. Mild right and moderate left V4 stenoses. 4. Mild bilateral intracranial ICA stenosis. 5. Negative CT perfusion imaging. 6.  No evidence of intracranial metastatic disease. 7. Small retropharyngeal lipoma. 8.  Aortic Atherosclerosis (ICD10-I70.0). Preliminary findings of no large vessel occlusion were discussed with Dr. Rory Percy via telephone on 09/27/2018 at 1:05 p.m. Electronically Signed   By: Logan Bores M.D.   On: 09/27/2018 13:34   Ct Chest Wo Contrast  Result Date: 09/28/2018 CLINICAL DATA:  Recurrent pneumonia, cough. EXAM: CT CHEST WITHOUT CONTRAST TECHNIQUE: Multidetector CT imaging of the chest was performed following the standard protocol without IV contrast. COMPARISON:  Radiograph of same day.  CT scan of August 17, 2017. FINDINGS: Cardiovascular: Atherosclerosis of thoracic aorta is noted without aneurysm formation. Normal cardiac size. No pericardial effusion is noted. Mild coronary artery calcifications are noted. Mediastinum/Nodes: No enlarged mediastinal or axillary lymph nodes. Thyroid gland, trachea, and esophagus demonstrate no significant findings. Lungs/Pleura: No pneumothorax or pleural effusion is noted. Bronchiectasis is again noted in both lower lobes. Stable degree of subsegmental atelectasis or scarring is noted posteriorly in both lung bases. Stable emphysematous bulla formation is noted in right upper lobe. Right upper lobe tree-in-bud opacities noted on prior exam is slightly decreased, suggesting improving inflammation, although residual inflammation or scarring remains. No new opacities are seen to suggest acute inflammation. Upper Abdomen: No acute abnormality. Musculoskeletal: No chest wall mass or suspicious bone lesions identified. IMPRESSION: Tree-in-bud opacity seen in right upper lobe on prior exam are slightly decreased, suggesting improving atypical or mycobacterial inflammation, although residual inflammation or scarring remains. Stable bronchiectasis is noted in both lungs, particularly in the lower lobes. Stable degree of subsegmental atelectasis or scarring is noted posteriorly in the  lower lobes. Mild coronary artery calcifications are noted. Aortic Atherosclerosis (ICD10-I70.0) and Emphysema (ICD10-J43.9). Electronically Signed   By: Marijo Conception, M.D.   On: 09/28/2018 07:57   Mr Brain Wo Contrast  Result Date: 09/28/2018 CLINICAL DATA:  Acute onset left-sided weakness and slurred speech yesterday. Blurred vision. Exam was nonfocal. EXAM: MRI HEAD WITHOUT CONTRAST TECHNIQUE: Multiplanar, multiecho pulse sequences of the brain and surrounding structures were obtained without intravenous contrast. COMPARISON:  CT head and CTA head and neck 09/27/2017. MRI brain 11/09/2015 FINDINGS: Brain: No acute infarct, hemorrhage, or mass lesion is present. Mild atrophy and white matter changes are within normal limits for age. The ventricles are of normal size. No significant extraaxial fluid collection is present. The internal auditory canals are within normal limits. The brainstem and cerebellum are within normal limits. Vascular: Flow is present in the major intracranial arteries. Skull and upper cervical spine: The craniocervical junction is normal. Degenerative changes are noted in the cervical spine with endplate changes at U5-4, C4-5, and C5-6. Central canal narrowing is greatest at C3-4. There is slight anterolisthesis at C4-5. 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 Sinuses/Orbits: Mild mucosal thickening is present throughout the anterior paranasal sinuses. Right maxillary sinus is smaller than the left, likely the sequelae chronic or recurrent disease. No active disease is present otherwise. Bilateral lens replacements are noted. Globes and orbits are otherwise unremarkable. IMPRESSION: 1. No acute or subacute infarct to explain the patient's symptoms. 2. Normal and stable MRI appearance of the brain for age. 3. Mild sinus disease appears chronic. 4. Degenerative changes within the upper cervical spine. Electronically Signed   By: San Morelle M.D.   On:  09/28/2018 06:55   Ct  Abdomen Pelvis W Contrast  Result Date: 09/27/2018 CLINICAL DATA:  Abdominal pain. Vomiting. History of ovarian cancer and right breast cancer. EXAM: CT ABDOMEN AND PELVIS WITH CONTRAST TECHNIQUE: Multidetector CT imaging of the abdomen and pelvis was performed using the standard protocol following bolus administration of intravenous contrast. CONTRAST:  137mL OMNIPAQUE IOHEXOL 300 MG/ML  SOLN COMPARISON:  CT scan dated 05/20/2013 FINDINGS: Lower chest: There is new borderline cardiomegaly since the prior study of 2014. Aortic atherosclerosis. Progressive bronchiectasis at both lung bases with mucous plugs in some of the distal dilated bronchi. No consolidative infiltrates pleural effusions. Hepatobiliary: There are slightly dilated intra and extrahepatic bile ducts. Status post cholecystectomy. Common bile duct diameter is 10 mm, upper limits of normal for post cholecystectomy patient. No evidence of an ampullary mass. Pancreas: Unremarkable. No pancreatic ductal dilatation or surrounding inflammatory changes. Spleen: Normal in size without focal abnormality. Adrenals/Urinary Tract: Adrenal glands are unremarkable. Kidneys are normal, without renal calculi, focal lesion, or hydronephrosis. Bladder is unremarkable. Stomach/Bowel: Stomach is within normal limits. Appendix appears normal. No evidence of bowel wall thickening, distention, or inflammatory changes. Vascular/Lymphatic: Aortic atherosclerosis. No enlarged abdominal or pelvic lymph nodes. Reproductive: Status post hysterectomy. No adnexal masses. Other: No abdominal wall hernia or abnormality. No abdominopelvic ascites. Musculoskeletal: No acute abnormalities. Degenerative disc and joint disease at L5-S1. 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 IMPRESSION: 1. Slight dilatation of the biliary tree, probably secondary to prior cholecystectomy. Patient's bilirubin is not elevated. 2. No acute abnormalities of the abdomen or pelvis. 3. Progressive  bronchiectasis at both lung bases with mucous plugs in some of the distal dilated bronchi. Aortic Atherosclerosis (ICD10-I70.0). Electronically Signed   By: Lorriane Shire M.D.   On: 09/27/2018 14:42   Ct Cerebral Perfusion W Contrast  Result Date: 09/27/2018 CLINICAL DATA:  Aphasia.  Blurred vision.  History of breast cancer. EXAM: CT ANGIOGRAPHY HEAD AND NECK CT PERFUSION BRAIN TECHNIQUE: Multidetector CT imaging of the head and neck was performed using the standard protocol during bolus administration of intravenous contrast. Multiplanar CT image reconstructions and MIPs were obtained to evaluate the vascular anatomy. Carotid stenosis measurements (when applicable) are obtained utilizing NASCET criteria, using the distal internal carotid diameter as the denominator. Multiphase CT imaging of the brain was performed following IV bolus contrast injection. Subsequent parametric perfusion maps were calculated using RAPID software. CONTRAST:  157mL ISOVUE-370 IOPAMIDOL (ISOVUE-370) INJECTION 76% COMPARISON:  Brain MRI 11/09/2015 FINDINGS: CTA NECK FINDINGS Aortic arch: Standard 3 vessel aortic arch with mild atherosclerotic plaque. Widely patent arch vessel origins. Right carotid system: Patent with minimal plaque at the carotid bifurcation. No evidence of stenosis or dissection. Left carotid system: Patent with minimal plaque at the carotid bifurcation. No evidence of stenosis or dissection. Vertebral arteries: Patent without evidence of significant stenosis or dissection. Dominant right vertebral artery. Skeleton: Moderate cervical disc degeneration. No suspicious osseous lesion. Other neck: Focally prominent retropharyngeal fat from C2-C5 asymmetrically greater to the right and with mild mass effect on adjacent structures compatible with a benign lipoma and measuring 3.2 x 1.0 cm (transverse x AP). Right-sided external laryngocele. Upper chest: Bronchiectasis in the apical right upper lobe. Review of the MIP  images confirms the above findings CTA HEAD FINDINGS Anterior circulation: The internal carotid arteries are patent from skull base to carotid termini with calcified plaque resulting in mild bilateral cavernous segment stenosis. ACAs and MCAs are patent with mild branch vessel irregularity but no evidence of proximal branch occlusion  or significant proximal stenosis. No aneurysm is identified. Posterior circulation: The intracranial vertebral arteries are patent to the basilar with the right being dominant. Distal V4 stenoses are mild on the right and moderate on the left. The right PICA and left AICA appear dominant. Patent SCA origins are seen bilaterally. The basilar artery is widely patent. There are right larger than left posterior communicating arteries with a fetal origin noted of the right PCA. Both PCAs are patent with branch vessel irregularity but no significant proximal stenosis. No aneurysm is identified. Venous sinuses: Patent. Anatomic variants: Fetal right PCA. Delayed phase: No abnormal enhancement. Review of the MIP images confirms the above findings CT Brain Perfusion Findings: CBF (<30%) Volume: 0 mL Perfusion (Tmax>6.0s) volume: 0 mL Mismatch Volume: 0 mL Infarction Location: None IMPRESSION: 1. No emergent large vessel occlusion. 2. Minimal cervical carotid artery atherosclerosis without stenosis. 3. Mild right and moderate left V4 stenoses. 4. Mild bilateral intracranial ICA stenosis. 5. Negative CT perfusion imaging. 6. No evidence of intracranial metastatic disease. 7. Small retropharyngeal lipoma. 8.  Aortic Atherosclerosis (ICD10-I70.0). Preliminary findings of no large vessel occlusion were discussed with Dr. Rory Percy via telephone on 09/27/2018 at 1:05 p.m. Electronically Signed   By: Logan Bores M.D.   On: 09/27/2018 13:34   Dg Chest Portable 1 View  Result Date: 09/27/2018 CLINICAL DATA:  Headache.  Vomiting. EXAM: PORTABLE CHEST 1 VIEW COMPARISON:  01/02/2018. FINDINGS: Mediastinum  and hilar structures are normal. Heart size stable. Bibasilar pulmonary interstitial prominence with bronchiectasis noted. These findings are chronic. No pleural effusion or pneumothorax. Surgical clips left chest. IMPRESSION: Bibasilar chronic interstitial lung disease and bronchiectasis. Similar findings noted on multiple prior exams. Electronically Signed   By: Marcello Moores  Register   On: 09/27/2018 14:06   Ct Head Code Stroke Wo Contrast  Result Date: 09/27/2018 CLINICAL DATA:  Code stroke.  Slurred speech.  Blurred vision. EXAM: CT HEAD WITHOUT CONTRAST TECHNIQUE: Contiguous axial images were obtained from the base of the skull through the vertex without intravenous contrast. COMPARISON:  Brain MRI 11/09/2015 FINDINGS: Brain: There is no evidence of acute infarct, intracranial hemorrhage, mass, midline shift, or extra-axial fluid collection. The ventricles and sulci are normal. No significant white matter disease is evident for age. Vascular: Calcified atherosclerosis at the skull base. No hyperdense vessel. Skull: No fracture. 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 Sinuses/Orbits: Evidence of chronic sinusitis. Mild right maxillary sinus mucosal thickening. No sinus fluid. Clear mastoid air cells. Bilateral cataract extraction. Other: None. ASPECTS [facility] Stroke Program Early CT Score) - Ganglionic level infarction (caudate, lentiform nuclei, internal capsule, insula, M1-M3 cortex): 7 - Supraganglionic infarction (M4-M6 cortex): 3 Total score (0-10 with 10 being normal): 10 IMPRESSION: 1. No evidence of acute intracranial abnormality. 2. ASPECTS is 10. These results were communicated to Dr. Rory Percy at 1:03 pm on 09/27/2018 by text page via the [facility] messaging system. Electronically Signed   By: Logan Bores M.D.   On: 09/27/2018 13:03     Transthoracic Echocardiogram (09/28/18) Study Conclusions  - Left ventricle: The cavity size was normal. Wall thickness was   increased in a  pattern of mild LVH. Systolic function was normal.   The estimated ejection fraction was in the range of 55% to 60%.   Wall motion was normal; there were no regional wall motion   abnormalities. Doppler parameters are consistent with abnormal   left ventricular relaxation (grade 1 diastolic dysfunction).   Doppler parameters are consistent with high ventricular  filling   pressure. - Aortic valve: Transvalvular velocity was within the normal range.   There was no stenosis. There was no regurgitation. - Mitral valve: Transvalvular velocity was within the normal range.   There was no evidence for stenosis. There was no regurgitation. - Right ventricle: The cavity size was normal. Wall thickness was   normal. Systolic function was normal. - Tricuspid valve: There was trivial regurgitation.  Impressions:  - Endocardial border definition is poor. Systolic function appears   grossly normal.   Subjective: No issues. Patient feels well.  Discharge Exam: Vitals:   09/28/18 0353 09/28/18 1140  BP: (!) 148/64 (!) 147/56  Pulse: 60 61  Resp: 17 17  Temp: 98.4 F (36.9 C) (!) 97.4 F (36.3 C)  SpO2: 97% 98%   Vitals:   09/27/18 2243 09/28/18 0104 09/28/18 0353 09/28/18 1140  BP: (!) 179/55 (!) 151/62 (!) 148/64 (!) 147/56  Pulse: 68 (!) 57 60 61  Resp: 18 18 17 17   Temp:   98.4 F (36.9 C) (!) 97.4 F (36.3 C)  TempSrc:   Oral Oral  SpO2: 97%  97% 98%  Weight:      Height:        General: Pt is alert, awake, not in acute distress Cardiovascular: RRR, S1/S2 +, no rubs, no gallops Respiratory: CTA bilaterally, no wheezing, no rhonchi Abdominal: Soft, NT, ND, bowel sounds + Extremities: no edema, no cyanosis    The results of significant diagnostics from this hospitalization (including imaging, microbiology, ancillary and laboratory) are listed below for reference.     Microbiology: Recent Results (from the past 240 hour(s))  Respiratory Panel by PCR     Status: None    Collection Time: 09/27/18  5:38 PM  Result Value Ref Range Status   Adenovirus NOT DETECTED NOT DETECTED Final   Coronavirus 229E NOT DETECTED NOT DETECTED Final   Coronavirus HKU1 NOT DETECTED NOT DETECTED Final   Coronavirus NL63 NOT DETECTED NOT DETECTED Final   Coronavirus OC43 NOT DETECTED NOT DETECTED Final   Metapneumovirus NOT DETECTED NOT DETECTED Final   Rhinovirus / Enterovirus NOT DETECTED NOT DETECTED Final   Influenza A NOT DETECTED NOT DETECTED Final   Influenza B NOT DETECTED NOT DETECTED Final   Parainfluenza Virus 1 NOT DETECTED NOT DETECTED Final   Parainfluenza Virus 2 NOT DETECTED NOT DETECTED Final   Parainfluenza Virus 3 NOT DETECTED NOT DETECTED Final   Parainfluenza Virus 4 NOT DETECTED NOT DETECTED Final   Respiratory Syncytial Virus NOT DETECTED NOT DETECTED Final   Bordetella pertussis NOT DETECTED NOT DETECTED Final   Chlamydophila pneumoniae NOT DETECTED NOT DETECTED Final   Mycoplasma pneumoniae NOT DETECTED NOT DETECTED Final  Culture, blood (routine x 2) Call MD if unable to obtain prior to antibiotics being given     Status: None (Preliminary result)   Collection Time: 09/27/18  7:25 PM  Result Value Ref Range Status   Specimen Description BLOOD LEFT ARM  Final   Special Requests   Final    BOTTLES DRAWN AEROBIC ONLY Blood Culture adequate volume   Culture NO GROWTH < 24 HOURS  Final   Report Status PENDING  Incomplete  Culture, blood (routine x 2) Call MD if unable to obtain prior to antibiotics being given     Status: None (Preliminary result)   Collection Time: 09/27/18  7:45 PM  Result Value Ref Range Status   Specimen Description BLOOD RIGHT ARM  Final   Special Requests  Final    BOTTLES DRAWN AEROBIC AND ANAEROBIC Blood Culture results may not be optimal due to an excessive volume of blood received in culture bottles   Culture NO GROWTH < 24 HOURS  Final   Report Status PENDING  Incomplete  MRSA PCR Screening     Status: None   Collection  Time: 09/28/18 11:28 AM  Result Value Ref Range Status   MRSA by PCR NEGATIVE NEGATIVE Final    Comment:        The GeneXpert MRSA Assay (FDA approved for NASAL specimens only), is one component of a comprehensive MRSA colonization surveillance program. It is not intended to diagnose MRSA infection nor to guide or monitor treatment for MRSA infections. Performed at [facility], [place] [address] [place], [place] [address]      Labs: BNP (last 3 results) No results for input(s): BNP in the last 8760 hours. Basic Metabolic Panel: Recent Labs  Lab 09/27/18 1250 09/27/18 1253 09/27/18 1938  NA 141 142  --   K 3.1* 3.1*  --   CL 108 109  --   CO2 22  --   --   GLUCOSE 139* 141*  --   BUN 11 12  --   CREATININE 0.90 0.70 0.88  CALCIUM 9.0  --   --    Liver Function Tests: Recent Labs  Lab 09/27/18 1250  AST 33  ALT 20  ALKPHOS 50  BILITOT 0.8  PROT 7.4  ALBUMIN 3.4*   No results for input(s): LIPASE, AMYLASE in the last 168 hours. No results for input(s): AMMONIA in the last 168 hours. CBC: Recent Labs  Lab 09/27/18 1250 09/27/18 1253 09/27/18 1938  WBC 9.1  --  5.9  NEUTROABS 3.6  --   --   HGB 13.6 14.3 12.5  HCT 41.6 42.0 38.0  MCV 98.3  --  98.2  PLT 124*  --  101*   Cardiac Enzymes: No results for input(s): CKTOTAL, CKMB, CKMBINDEX, TROPONINI in the last 168 hours. BNP: Invalid input(s): POCBNP CBG: No results for input(s): GLUCAP in the last 168 hours. D-Dimer No results for input(s): DDIMER in the last 72 hours. Hgb A1c Recent Labs    09/28/18 0528  HGBA1C 4.7*   Lipid Profile Recent Labs    09/28/18 0528  CHOL 140  HDL 62  LDLCALC 62  TRIG 80  CHOLHDL 2.3   Thyroid function studies No results for input(s): TSH, T4TOTAL, T3FREE, THYROIDAB in the last 72 hours.  Invalid input(s): FREET3 Anemia work up No results for input(s): VITAMINB12, FOLATE, FERRITIN, TIBC, IRON, RETICCTPCT in the last 72 hours. Urinalysis      Component Value Date/Time   COLORURINE YELLOW 09/27/2018 1603   APPEARANCEUR CLEAR 09/27/2018 1603   LABSPEC 1.042 (H) 09/27/2018 1603   PHURINE 7.0 09/27/2018 1603   GLUCOSEU NEGATIVE 09/27/2018 1603   HGBUR NEGATIVE 09/27/2018 1603   BILIRUBINUR NEGATIVE 09/27/2018 1603   KETONESUR NEGATIVE 09/27/2018 1603   PROTEINUR NEGATIVE 09/27/2018 1603   UROBILINOGEN 0.2 05/20/2013 0444   NITRITE NEGATIVE 09/27/2018 1603   LEUKOCYTESUR NEGATIVE 09/27/2018 1603   Sepsis Labs Invalid input(s): PROCALCITONIN,  WBC,  LACTICIDVEN Microbiology Recent Results (from the past 240 hour(s))  Respiratory Panel by PCR     Status: None   Collection Time: 09/27/18  5:38 PM  Result Value Ref Range Status   Adenovirus NOT DETECTED NOT DETECTED Final   Coronavirus 229E NOT DETECTED NOT DETECTED Final   Coronavirus HKU1 NOT  DETECTED NOT DETECTED Final   Coronavirus NL63 NOT DETECTED NOT DETECTED Final   Coronavirus OC43 NOT DETECTED NOT DETECTED Final   Metapneumovirus NOT DETECTED NOT DETECTED Final   Rhinovirus / Enterovirus NOT DETECTED NOT DETECTED Final   Influenza A NOT DETECTED NOT DETECTED Final   Influenza B NOT DETECTED NOT DETECTED Final   Parainfluenza Virus 1 NOT DETECTED NOT DETECTED Final   Parainfluenza Virus 2 NOT DETECTED NOT DETECTED Final   Parainfluenza Virus 3 NOT DETECTED NOT DETECTED Final   Parainfluenza Virus 4 NOT DETECTED NOT DETECTED Final   Respiratory Syncytial Virus NOT DETECTED NOT DETECTED Final   Bordetella pertussis NOT DETECTED NOT DETECTED Final   Chlamydophila pneumoniae NOT DETECTED NOT DETECTED Final   Mycoplasma pneumoniae NOT DETECTED NOT DETECTED Final  Culture, blood (routine x 2) Call MD if unable to obtain prior to antibiotics being given     Status: None (Preliminary result)   Collection Time: 09/27/18  7:25 PM  Result Value Ref Range Status   Specimen Description BLOOD LEFT ARM  Final   Special Requests   Final    BOTTLES DRAWN AEROBIC ONLY Blood  Culture adequate volume   Culture NO GROWTH < 24 HOURS  Final   Report Status PENDING  Incomplete  Culture, blood (routine x 2) Call MD if unable to obtain prior to antibiotics being given     Status: None (Preliminary result)   Collection Time: 09/27/18  7:45 PM  Result Value Ref Range Status   Specimen Description BLOOD RIGHT ARM  Final   Special Requests   Final    BOTTLES DRAWN AEROBIC AND ANAEROBIC Blood Culture results may not be optimal due to an excessive volume of blood received in culture bottles   Culture NO GROWTH < 24 HOURS  Final   Report Status PENDING  Incomplete  MRSA PCR Screening     Status: None   Collection Time: 09/28/18 11:28 AM  Result Value Ref Range Status   MRSA by PCR NEGATIVE NEGATIVE Final    Comment:        The GeneXpert MRSA Assay (FDA approved for NASAL specimens only), is one component of a comprehensive MRSA colonization surveillance program. It is not intended to diagnose MRSA infection nor to guide or monitor treatment for MRSA infections. Performed at [facility], [place] [address], [place], [place] [address]     SIGNED:   Cordelia Poche, MD [facility] 09/28/2018, 5:20 PM

## 2018-09-28 NOTE — Discharge Instructions (Signed)
Monique Watson,  You were in the hospital because of some confusion. This may be because of low oxygen. You have significantly improved and your workup was negative. No evidence of stroke. You may have some bronchitis and you will go home with an antibiotic. Physical therapy has recommended outpatient physical therapy for you.

## 2018-09-28 NOTE — Evaluation (Signed)
Physical Therapy Evaluation Patient Details Name: Monique Watson MRN: [id number] DOB: May 02, 1941 Today's Date: 09/28/2018   History of Present Illness  Pt is a 78 y/o female admitted secondary to blurred vision, difficulty speaking and L sided weakness. MRI was negative for any acute findings. PMH including but not limited to HTN, breast cancer and ovarian cancer.     Clinical Impression  Pt presented supine in bed with HOB elevated, awake and willing to participate in therapy session. Pt's son present throughout session as well. Prior to admission, pt reported that she was independent with all functional mobility and ADLs. Pt lives with her son in a single level home with one step to enter. She was also going to OP PT 2x/week PTA. Pt currently at min guard level overall for functional mobility without use of an AD. Pt guarded and slow with ambulation; however, no LOB or need for physical assistance. Pt would continue to benefit from skilled physical therapy services at this time while admitted and after d/c to address the below listed limitations in order to improve overall safety and independence with functional mobility.  Pt on RA throughout with SPO2 maintaining >91% with all activity.     Follow Up Recommendations Outpatient PT;Other (comment)(continue with OP PT services)    Equipment Recommendations  None recommended by PT    Recommendations for Other Services       Precautions / Restrictions Precautions Precautions: Fall Restrictions Weight Bearing Restrictions: No      Mobility  Bed Mobility Overal bed mobility: Modified Independent                Transfers Overall transfer level: Needs assistance Equipment used: None Transfers: Sit to/from Stand Sit to Stand: Min guard         General transfer comment: min guard for safety, pt steady with transition but cautious  Ambulation/Gait Ambulation/Gait assistance: Min guard Gait Distance (Feet): 100  Feet Assistive device: None Gait Pattern/deviations: Step-through pattern;Decreased step length - right;Decreased step length - left;Decreased stride length;Narrow base of support Gait velocity: decreased   General Gait Details: pt with slow, cautious and guarded gait with mild instability but no overt LOB or need for physical assistance, min guard for safety throughout without need for UE supports  Stairs            Wheelchair Mobility    Modified Rankin (Stroke Patients Only)       Balance Overall balance assessment: Needs assistance Sitting-balance support: Feet supported Sitting balance-Leahy Scale: Good     Standing balance support: No upper extremity supported Standing balance-Leahy Scale: Fair                               Pertinent Vitals/Pain Pain Assessment: No/denies pain    Home Living Family/patient expects to be discharged to:: Private residence Living Arrangements: Children Available Help at Discharge: Family;Available 24 hours/day Type of Home: House Home Access: Stairs to enter   [employer] of Steps: 1 Home Layout: One level Home Equipment: Shower seat      Prior Function Level of Independence: Independent               Hand Dominance   Dominant Hand: Right    Extremity/Trunk Assessment   Upper Extremity Assessment Upper Extremity Assessment: Defer to OT evaluation;Overall [facility] for tasks assessed    Lower Extremity Assessment Lower Extremity Assessment: Overall WFL for tasks assessed  Communication   Communication: No difficulties  Cognition Arousal/Alertness: Awake/alert Behavior During Therapy: WFL for tasks assessed/performed Overall Cognitive Status: Within Functional Limits for tasks assessed                                        General Comments      Exercises     Assessment/Plan    PT Assessment Patient needs continued PT services  PT Problem List Decreased  balance;Decreased mobility;Decreased coordination;Decreased safety awareness;Decreased knowledge of precautions;Decreased knowledge of use of DME       PT Treatment Interventions DME instruction;Gait training;Stair training;Functional mobility training;Therapeutic activities;Neuromuscular re-education;Therapeutic exercise;Balance training;Patient/family education    PT Goals (Current goals can be found in the Care Plan section)  Acute Rehab PT Goals Patient Stated Goal: "go home today" PT Goal Formulation: With patient/family Time For Goal Achievement: 10/12/18 Potential to Achieve Goals: Good    Frequency Min 3X/week   Barriers to discharge        Co-evaluation               AM-PAC PT "6 Clicks" Mobility  Outcome Measure Help needed turning from your back to your side while in a flat bed without using bedrails?: None Help needed moving from lying on your back to sitting on the side of a flat bed without using bedrails?: None Help needed moving to and from a bed to a chair (including a wheelchair)?: None Help needed standing up from a chair using your arms (e.g., wheelchair or bedside chair)?: A Little Help needed to walk in hospital room?: A Little Help needed climbing 3-5 steps with a railing? : A Lot 6 Click Score: 20    End of Session Equipment Utilized During Treatment: Gait belt Activity Tolerance: Patient tolerated treatment well Patient left: in chair;with call bell/phone within reach;with family/visitor present Nurse Communication: Mobility status PT Visit Diagnosis: Other abnormalities of gait and mobility (R26.89)    Time: 9163-8466 PT Time Calculation (min) (ACUTE ONLY): 39 min   Charges:   PT Evaluation $PT Eval Moderate Complexity: 1 Mod PT Treatments $Gait Training: 23-37 mins        Sherie Don, PT, DPT  Acute Rehabilitation Services Pager [phone] Office [place] 09/28/2018, 11:26 AM

## 2018-09-28 NOTE — Progress Notes (Signed)
Pt discharged home via private vehicle at [facility].

## 2018-09-28 NOTE — Progress Notes (Signed)
New IV inserted, Potassium IV restarted, diluted with NS @75 . No complaint of pain in the IV from the Pt.

## 2018-09-28 NOTE — Progress Notes (Addendum)
 -   MR negative for stroke - h/o appears to be more like syncope- pt felt dizzy, blurred vision and then appears to have passed out as she does not recall event  - check orthostatic vitals, Echo    Neurology will sign off.

## 2018-09-28 NOTE — Progress Notes (Signed)
2D Echocardiogram has been performed.  Monique Watson 09/28/2018, 3:26 PM

## 2018-09-28 NOTE — Progress Notes (Signed)
VASCULAR LAB    Patient had normal CTA of head and neck.  Please advise if Carotid duplex still needed.  Thank you    Talecia Sherlin, RVT 09/28/2018, 7:06 AM

## 2018-09-28 NOTE — Evaluation (Signed)
Occupational Therapy Evaluation Patient Details Name: Monique Watson MRN: [id number] DOB: [birth year] Today's Date: 09/28/2018    History of Present Illness Pt is a 78 y/o female admitted secondary to blurred vision, difficulty speaking and L sided weakness. MRI was negative for any acute findings. PMH including but not limited to HTN, breast cancer and ovarian cancer.    Clinical Impression   Patient evaluated by Occupational Therapy with no further acute OT needs identified. All education has been completed and the patient has no further questions. Pt currently requires supervision for ADLs - she is a bit guarded with activity, but should progress quickly back to independence.   See below for any follow-up Occupational Therapy or equipment needs. OT is signing off. Thank you for this referral.      Follow Up Recommendations  No OT follow up;Supervision - Intermittent    Equipment Recommendations  None recommended by OT    Recommendations for Other Services       Precautions / Restrictions Precautions Precautions: Fall Restrictions Weight Bearing Restrictions: No      Mobility Bed Mobility Overal bed mobility: Modified Independent                Transfers Overall transfer level: Needs assistance Equipment used: None Transfers: Sit to/from Stand Sit to Stand: Supervision         General transfer comment: min guard for safety, pt steady with transition but cautious    Balance Overall balance assessment: Needs assistance Sitting-balance support: Feet supported Sitting balance-Leahy Scale: Good     Standing balance support: No upper extremity supported Standing balance-Leahy Scale: Good Standing balance comment: Pt able to retrieve item from floor with min guard assist, but no LOB                            ADL either performed or assessed with clinical judgement   ADL Overall ADL's : Needs assistance/impaired Eating/Feeding: Independent    Grooming: Wash/dry hands;Wash/dry face;Oral care;Supervision/safety;Standing   Upper Body Bathing: Set up;Sitting   Lower Body Bathing: Supervison/ safety;Sit to/from stand   Upper Body Dressing : Set up;Sitting   Lower Body Dressing: Supervision/safety;Sit to/from stand   Toilet Transfer: Supervision/safety;Ambulation;Comfort height toilet   Toileting- Clothing Manipulation and Hygiene: Supervision/safety;Sit to/from stand   Tub/ Shower Transfer: Tub transfer;Min guard;Ambulation Tub/Shower Transfer Details (indicate cue type and reason): performed simulated tub transfer with min guard assist  Functional mobility during ADLs: Supervision/safety;Rolling walker General ADL Comments: Pt moves slowly and is a bit guarded      Vision Baseline Vision/History: Wears glasses Wears Glasses: Reading only Patient Visual Report: No change from baseline Vision Assessment?: No apparent visual deficits     Perception Perception Perception Tested?: Yes   Praxis Praxis Praxis tested?: Within functional limits    Pertinent Vitals/Pain       Hand Dominance Right   Extremity/Trunk Assessment Upper Extremity Assessment Upper Extremity Assessment: Overall WFL for tasks assessed   Lower Extremity Assessment Lower Extremity Assessment: Overall WFL for tasks assessed   Cervical / Trunk Assessment Cervical / Trunk Assessment: Normal   Communication Communication Communication: No difficulties   Cognition Arousal/Alertness: Awake/alert Behavior During Therapy: WFL for tasks assessed/performed Overall Cognitive Status: Within Functional Limits for tasks assessed  General Comments       Exercises     Shoulder Instructions      Home Living Family/patient expects to be discharged to:: Private residence Living Arrangements: Children Available Help at Discharge: Family;Available 24 hours/day Type of Home: House Home Access:  Stairs to enter [employer] of Steps: 1   Home Layout: One level     Bathroom Shower/Tub: Teacher, early years/pre: Standard     Home Equipment: Shower seat          Prior Functioning/Environment Level of Independence: Independent                 OT Problem List: Decreased activity tolerance      OT Treatment/Interventions: Self-care/ADL training;DME and/or AE instruction;Therapeutic activities;Balance training    OT Goals(Current goals can be found in the care plan section) Acute Rehab OT Goals Patient Stated Goal: I was hoping to go home today  OT Goal Formulation: All assessment and education complete, DC therapy  OT Frequency: Min 2X/week   Barriers to D/C: Decreased caregiver support          Co-evaluation              AM-PAC OT "6 Clicks" Daily Activity     Outcome Measure Help from another person eating meals?: None Help from another person taking care of personal grooming?: None Help from another person toileting, which includes using toliet, bedpan, or urinal?: None Help from another person bathing (including washing, rinsing, drying)?: None Help from another person to put on and taking off regular upper body clothing?: None Help from another person to put on and taking off regular lower body clothing?: None 6 Click Score: 24   End of Session Equipment Utilized During Treatment: Rolling walker;Gait belt Nurse Communication: Mobility status  Activity Tolerance: Patient tolerated treatment well Patient left: in bed;with call bell/phone within reach  OT Visit Diagnosis: Unsteadiness on feet (R26.81)                Time: 4496-7591 OT Time Calculation (min): 14 min Charges:  OT General Charges $OT Visit: 1 Visit OT Evaluation $OT Eval Low Complexity: 1 Low  Lucille Passy, OTR/L Acute Rehabilitation Services Pager [phone] Office [phone]   Lucille Passy M 09/28/2018, 4:02 PM

## 2018-09-30 DIAGNOSIS — R42 Dizziness and giddiness: Secondary | ICD-10-CM | POA: Diagnosis not present

## 2018-09-30 DIAGNOSIS — M542 Cervicalgia: Secondary | ICD-10-CM | POA: Diagnosis not present

## 2018-09-30 DIAGNOSIS — H8113 Benign paroxysmal vertigo, bilateral: Secondary | ICD-10-CM | POA: Diagnosis not present

## 2018-09-30 DIAGNOSIS — M6281 Muscle weakness (generalized): Secondary | ICD-10-CM | POA: Diagnosis not present

## 2018-10-02 DIAGNOSIS — R42 Dizziness and giddiness: Secondary | ICD-10-CM | POA: Diagnosis not present

## 2018-10-02 DIAGNOSIS — M62838 Other muscle spasm: Secondary | ICD-10-CM | POA: Diagnosis not present

## 2018-10-02 DIAGNOSIS — H8113 Benign paroxysmal vertigo, bilateral: Secondary | ICD-10-CM | POA: Diagnosis not present

## 2018-10-02 DIAGNOSIS — M6281 Muscle weakness (generalized): Secondary | ICD-10-CM | POA: Diagnosis not present

## 2018-10-02 LAB — CULTURE, BLOOD (ROUTINE X 2)
Culture: NO GROWTH
Culture: NO GROWTH
Special Requests: ADEQUATE

## 2018-10-07 ENCOUNTER — Encounter: Payer: Self-pay | Admitting: Cardiology

## 2018-10-07 DIAGNOSIS — D696 Thrombocytopenia, unspecified: Secondary | ICD-10-CM | POA: Diagnosis not present

## 2018-10-07 DIAGNOSIS — E876 Hypokalemia: Secondary | ICD-10-CM | POA: Diagnosis not present

## 2018-10-07 DIAGNOSIS — R55 Syncope and collapse: Secondary | ICD-10-CM | POA: Diagnosis not present

## 2018-10-11 ENCOUNTER — Ambulatory Visit: Payer: Medicare Other | Admitting: Cardiology

## 2018-10-15 DIAGNOSIS — M6281 Muscle weakness (generalized): Secondary | ICD-10-CM | POA: Diagnosis not present

## 2018-10-15 DIAGNOSIS — N3946 Mixed incontinence: Secondary | ICD-10-CM | POA: Diagnosis not present

## 2018-10-15 DIAGNOSIS — R102 Pelvic and perineal pain: Secondary | ICD-10-CM | POA: Diagnosis not present

## 2018-10-15 DIAGNOSIS — R42 Dizziness and giddiness: Secondary | ICD-10-CM | POA: Diagnosis not present

## 2018-10-15 DIAGNOSIS — H8113 Benign paroxysmal vertigo, bilateral: Secondary | ICD-10-CM | POA: Diagnosis not present

## 2018-10-15 DIAGNOSIS — M62838 Other muscle spasm: Secondary | ICD-10-CM | POA: Diagnosis not present

## 2018-10-31 DIAGNOSIS — J209 Acute bronchitis, unspecified: Secondary | ICD-10-CM | POA: Diagnosis not present

## 2018-11-04 DIAGNOSIS — M62838 Other muscle spasm: Secondary | ICD-10-CM | POA: Diagnosis not present

## 2018-11-04 DIAGNOSIS — M6281 Muscle weakness (generalized): Secondary | ICD-10-CM | POA: Diagnosis not present

## 2018-11-04 DIAGNOSIS — N3946 Mixed incontinence: Secondary | ICD-10-CM | POA: Diagnosis not present

## 2018-11-04 DIAGNOSIS — R102 Pelvic and perineal pain: Secondary | ICD-10-CM | POA: Diagnosis not present

## 2018-11-08 ENCOUNTER — Ambulatory Visit: Payer: Medicare Other | Admitting: Cardiology

## 2018-11-27 ENCOUNTER — Encounter: Payer: Self-pay | Admitting: *Deleted

## 2018-11-29 ENCOUNTER — Ambulatory Visit (INDEPENDENT_AMBULATORY_CARE_PROVIDER_SITE_OTHER): Payer: Medicare Other | Admitting: Diagnostic Neuroimaging

## 2018-11-29 ENCOUNTER — Other Ambulatory Visit: Payer: Self-pay

## 2018-11-29 ENCOUNTER — Encounter: Payer: Self-pay | Admitting: Diagnostic Neuroimaging

## 2018-11-29 VITALS — BP 147/72 | HR 70 | Ht 66.0 in | Wt 187.0 lb

## 2018-11-29 DIAGNOSIS — G459 Transient cerebral ischemic attack, unspecified: Secondary | ICD-10-CM | POA: Diagnosis not present

## 2018-11-29 NOTE — Progress Notes (Signed)
[facility]  PATIENT: Monique Watson DOB: 02/21/1941  REFERRING CLINICIAN: Edwin Dada HISTORY FROM: patient  REASON FOR VISIT: new consult    HISTORICAL  CHIEF COMPLAINT:  Chief Complaint  Patient presents with  . Syncope    rm 7, "one episode of syncope in Jan, none since then"    HISTORY OF PRESENT ILLNESS:   78 year old female here for evaluation of abnormal spell.  09/27/2018, patient was at home making breakfast with son.  She had put something on the stove and sat down.  Then she began to feel dizzy, had trouble talking, had trouble moving her left side.  She briefly lost vision as well.  She was having slurred speech.  Her son called 56 and patient was taken to the hospital.  Upon arrival she was found to have left-sided weakness, left neglect and gaze deviation.  Symptoms rapidly improved and CTA of the head and neck showed no large vessel occlusion.  Patient was admitted for evaluation.  MRI of the brain was negative for acute stroke.  Stroke work-up was completed.  Patient was diagnosed with possible toxic metabolic encephalopathy.  Since that time no further events.    REVIEW OF SYSTEMS: Full 14 system review of systems performed and negative with exception of: As per HPI.  ALLERGIES: Allergies  Allergen Reactions  . Chlorhexidine Anaphylaxis, Hives, Shortness Of Breath and Itching    anaphylaxis  . Arimidex [Anastrozole] Other (See Comments)    Body aches per patient. Irven Baltimore, RN.  Marland Kitchen Diovan [Valsartan] Other (See Comments)    arthralgias  . Uncoded Nonscreenable Allergen     Blood pressure medication patient stated that she has a low tolerance for    HOME MEDICATIONS: Outpatient Medications Prior to Visit  Medication Sig Dispense Refill  . aspirin 81 MG tablet Take 81 mg by mouth daily.    . Cholecalciferol (VITAMIN D3) 2000 UNITS TABS Take 1 capsule by mouth daily.     . Omega-3 Fatty Acids (FISH OIL CONCENTRATE) 1000 MG CAPS  Take 1,000 mg by mouth daily.     . potassium chloride (K-DUR,KLOR-CON) 10 MEQ tablet Take 10 mEq by mouth daily.    . tamoxifen (NOLVADEX) 20 MG tablet Take 1 tablet (20 mg total) by mouth daily. 90 tablet 3  . vitamin E (VITAMIN E) 400 UNIT capsule Take 400 Units by mouth 2 (two) times daily.    Marland Kitchen Respiratory Therapy Supplies (FLUTTER) DEVI Use as directed (Patient not taking: Reported on 11/29/2018) 1 each 0  . mirabegron ER (MYRBETRIQ) 50 MG TB24 tablet Take 50 mg by mouth daily.    . Polyethylene Glycol 3350 (PEG 3350) POWD Take 17 g by mouth daily.     No facility-administered medications prior to visit.     PAST MEDICAL HISTORY: Past Medical History:  Diagnosis Date  . Anemia   . Aortic atherosclerosis (HCC)    CT chest  . Arthritis   . Breast cancer ([place]) 09/29/2011   s/p radiation  . Hypertension   . Ovarian cancer ([place]) 09/29/2011   s/p chemo, radiation  . Pneumonia   . Radiation 01/14/2009-02/10/2009   left breast 45 Gy, boosted to 5 Gy  . Seasonal allergies   . Vitamin D deficiency     PAST SURGICAL HISTORY: Past Surgical History:  Procedure Laterality Date  . ABDOMINAL HYSTERECTOMY  2006  . APPENDECTOMY    . BLADDER SURGERY    . BREAST LUMPECTOMY    . BREAST LUMPECTOMY  WITH RADIOACTIVE SEED LOCALIZATION Right 04/07/2015   Procedure: BREAST LUMPECTOMY WITH RADIOACTIVE SEED LOCALIZATION;  Surgeon: Erroll Luna, MD;  Location: [place];  Service: General;  Laterality: Right;  . PORT-A-CATH REMOVAL N/A 07/13/2015   Procedure: REMOVAL PORT-A-CATH;  Surgeon: Erroll Luna, MD;  Location: [place];  Service: General;  Laterality: N/A;  . TONSILLECTOMY      FAMILY HISTORY: Family History  Problem Relation Age of Onset  . Ovarian cancer Mother 10  . Colon cancer Brother 33  . Breast cancer Paternal Aunt        6-7 with breast cancer; one bilateral, several premenopausal  . Pancreatitis Brother 6  . Colon cancer Paternal Aunt    . Cancer Cousin        NOS    SOCIAL HISTORY: Social History   Socioeconomic History  . Marital status: Married    Spouse name: Juanda Crumble  . Number of children: 2  . Years of education: 60  . Highest education level: Not on file  Occupational History    Comment: retired  Scientific laboratory technician  . Financial resource strain: Not on file  . Food insecurity:    Worry: Not on file    Inability: Not on file  . Transportation needs:    Medical: Not on file    Non-medical: Not on file  Tobacco Use  . Smoking status: Former Smoker    Packs/day: 1.00    Years: 58.00    Pack years: 58.00    Types: Cigarettes    Last attempt to quit: 07/19/2017    Years since quitting: 1.3  . Smokeless tobacco: Never Used  Substance and Sexual Activity  . Alcohol use: Yes    Alcohol/week: 1.0 standard drinks    Types: 1 Shots of liquor per week    Comment: 11/29/15 cocktail on weekends  . Drug use: No  . Sexual activity: Yes    Birth control/protection: Post-menopausal  Lifestyle  . Physical activity:    Days per week: Not on file    Minutes per session: Not on file  . Stress: Not on file  Relationships  . Social connections:    Talks on phone: Not on file    Gets together: Not on file    Attends religious service: Not on file    Active member of club or organization: Not on file    Attends meetings of clubs or organizations: Not on file    Relationship status: Not on file  . Intimate partner violence:    Fear of current or ex partner: Not on file    Emotionally abused: Not on file    Physically abused: Not on file    Forced sexual activity: Not on file  Other Topics Concern  . Not on file  Social History Narrative   Lives with husband   Caffeine use- coffee 2 cups daily, sometimes tea     PHYSICAL EXAM  GENERAL EXAM/CONSTITUTIONAL: Vitals:  Vitals:   11/29/18 0959  BP: (!) 147/72  Pulse: 70  Weight: 187 lb (84.8 kg)  Height: 5\' 6"  (1.676 m)     Body mass index is 30.18 kg/m.  Wt Readings from Last 3 Encounters:  11/29/18 187 lb (84.8 kg)  09/27/18 178 lb 9.2 oz (81 kg)  01/21/18 178 lb (80.7 kg)     Patient is in no distress; well developed, nourished and groomed; neck is supple  CARDIOVASCULAR:  Examination of carotid arteries is normal; no carotid bruits  Regular rate and rhythm, no murmurs  Examination of peripheral vascular system by observation and palpation is normal  EYES:  Ophthalmoscopic exam of optic discs and posterior segments is normal; no papilledema or hemorrhages  No exam data present  MUSCULOSKELETAL:  Gait, strength, tone, movements noted in Neurologic exam below  NEUROLOGIC: MENTAL STATUS:  No flowsheet data found.  awake, alert, oriented to person, place and time  recent and remote memory intact  normal attention and concentration  language fluent, comprehension intact, naming intact  fund of knowledge appropriate  CRANIAL NERVE:   2nd - no papilledema on fundoscopic exam  2nd, 3rd, 4th, 6th - pupils equal and reactive to light, visual fields full to confrontation, extraocular muscles intact, no nystagmus  5th - facial sensation symmetric  7th - facial strength symmetric  8th - hearing intact  9th - palate elevates symmetrically, uvula midline  11th - shoulder shrug symmetric  12th - tongue protrusion midline  MOTOR:   normal bulk and tone, full strength in the BUE, BLE  SENSORY:   normal and symmetric to light touch, temperature, vibration  COORDINATION:   finger-nose-finger, fine finger movements normal  REFLEXES:   deep tendon reflexes TRACE and symmetric  GAIT/STATION:   narrow based gait    DIAGNOSTIC DATA (LABS, IMAGING, TESTING) - I reviewed patient records, labs, notes, testing and imaging myself where available.  Lab Results  Component Value Date   WBC 5.9 09/27/2018   HGB 12.5 09/27/2018   HCT 38.0 09/27/2018   MCV 98.2 09/27/2018   PLT 101 (L) 09/27/2018       Component Value Date/Time   NA 142 09/27/2018 1253   NA 144 01/16/2017 1430   K 3.1 (L) 09/27/2018 1253   K 3.5 01/16/2017 1430   CL 109 09/27/2018 1253   CL 112 (H) 10/24/2012 1119   CO2 22 09/27/2018 1250   CO2 26 01/16/2017 1430   GLUCOSE 141 (H) 09/27/2018 1253   GLUCOSE 99 01/16/2017 1430   GLUCOSE 103 (H) 10/24/2012 1119   BUN 12 09/27/2018 1253   BUN 14.6 01/16/2017 1430   CREATININE 0.88 09/27/2018 1938   CREATININE 0.9 01/16/2017 1430   CALCIUM 9.0 09/27/2018 1250   CALCIUM 9.1 01/16/2017 1430   PROT 7.4 09/27/2018 1250   PROT 7.4 01/16/2017 1430   ALBUMIN 3.4 (L) 09/27/2018 1250   ALBUMIN 3.4 (L) 01/16/2017 1430   AST 33 09/27/2018 1250   AST 23 01/16/2017 1430   ALT 20 09/27/2018 1250   ALT 19 01/16/2017 1430   ALKPHOS 50 09/27/2018 1250   ALKPHOS 54 01/16/2017 1430   BILITOT 0.8 09/27/2018 1250   BILITOT 0.35 01/16/2017 1430   GFRNONAA >60 09/27/2018 1938   GFRAA >60 09/27/2018 1938   Lab Results  Component Value Date   CHOL 140 09/28/2018   HDL 62 09/28/2018   LDLCALC 62 09/28/2018   TRIG 80 09/28/2018   CHOLHDL 2.3 09/28/2018   Lab Results  Component Value Date   HGBA1C 4.7 (L) 09/28/2018   Lab Results  Component Value Date   VITAMINB12 515 11/29/2015   Lab Results  Component Value Date   TSH 1.740 11/29/2015    09/28/18 TTE - Endocardial border definition is poor. Systolic function appears grossly normal.  09/27/18 CT head / CT perfusion / CTA head / neck [I reviewed images myself and agree with interpretation. -VRP]  1. No emergent large vessel occlusion. 2. Minimal cervical carotid artery atherosclerosis without stenosis. 3. Mild right and  moderate left V4 stenoses. 4. Mild bilateral intracranial ICA stenosis. 5. Negative CT perfusion imaging. 6. No evidence of intracranial metastatic disease. 7. Small retropharyngeal lipoma. 8. Aortic Atherosclerosis (ICD10-I70.0).  09/28/18 MRI brain [I reviewed images myself and agree with  interpretation. -VRP]  1. No acute or subacute infarct to explain the patient's symptoms. 2. Normal and stable MRI appearance of the brain for age. 3. Mild sinus disease appears chronic. 4. Degenerative changes within the upper cervical spine.    ASSESSMENT AND PLAN  78 y.o. year old female here with:  Dx: suspected TIA on 09/27/18 (dizziness, vision loss, vomiting, aphasia, left sided weakness, left neglect --> right parietal + bilateral occipital + brainstem localization)  1. TIA (transient ischemic attack)      PLAN:  TIA WORKUP / STROKE PREVENTION - check TEE; if negative then check cardiac monitor (30 day) vs implanted loop recorder; patient will see cardiology on Monday to discuss (Dr. Ellyn Hack) - continue aspirin, fish oil  Return for pending if symptoms worsen or fail to improve.  I reviewed images, labs, notes, records myself. I summarized findings and reviewed with patient, for this high risk condition (TIA) requiring high complexity decision making.     Penni Bombard, MD 06/01/7828, 56:21 AM Certified in Neurology, Neurophysiology and Neuroimaging  [facility] [address], [place], [place] [address] [phone]

## 2018-12-11 ENCOUNTER — Telehealth (INDEPENDENT_AMBULATORY_CARE_PROVIDER_SITE_OTHER): Payer: Medicare Other | Admitting: Cardiology

## 2018-12-11 ENCOUNTER — Encounter: Payer: Self-pay | Admitting: Cardiology

## 2018-12-11 DIAGNOSIS — G459 Transient cerebral ischemic attack, unspecified: Secondary | ICD-10-CM | POA: Diagnosis not present

## 2018-12-11 DIAGNOSIS — I10 Essential (primary) hypertension: Secondary | ICD-10-CM

## 2018-12-11 NOTE — Progress Notes (Signed)
Virtual Visit via Telephone Note    Evaluation Performed:  Follow-up visit  This visit type was conducted due to national recommendations for restrictions regarding the COVID-19 Pandemic (e.g. social distancing).  This format is felt to be most appropriate for this patient at this time.  All issues noted in this document were discussed and addressed.  No physical exam was performed (except for noted visual exam findings with Video Visits).  Please refer to the patient's chart (MyChart message for video visits and phone note for telephone visits) for the patient's consent to telehealth for [facility].  Date:  12/11/2018   ID:  Monique Watson, DOB [birth year], MRN [id number]  Patient Location: Home [address] [place] [address]   Provider location:   [place], [place]  PCP:  [name], MD  Cardiologist:  Glenetta Hew, MD  Electrophysiologist:  None   Chief Complaint:  No chief complaint on file.   History of Present Illness:    Monique Watson is a 78 y.o. 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  She presents via audio/tele-conferencing for a telehealth visit today.   Referring physician Dr. Andrey Spearman.    Patient was seen on November 29, 2018 by Dr. Leta Baptist. as part of hospital follow-up.  At that time, she noted complete resolution of symptoms and was feeling well with no further events.  His recommendation was for the patient to undergo TEE monitor versus loop recorder placement.  She was scheduled to see me initiate this evaluation.  Unfortunately, as part of our restrictions for COVID-19, non-urgent testing such as teas and monitor/loop recorder are not being scheduled at this time.  As such,  there is no urgency to this visit.  Also, since these procedures are procedures performed by my noninterventional colleagues, I felt it more prudent for her to potentially be seen by Dr. Sallyanne Kuster who does both the loop recorder and TEE.  I contacted Monique Watson via telephone to check and see how she was doing.  She does still continue to indicate that she is no longer having any of her neurologic symptoms.  In addition to this, she denies any active cardiac symptoms.  Remainder of cardiac review of symptoms: No PND, orthopnea or edema.  No palpitations, lightheadedness, dizziness, weakness or syncope/near syncope. No TIA/amaurosis fugax symptoms. No claudication.  The patient does not symptoms concerning for COVID-19 infection (fever, chills, cough, or new SHORTNESS OF BREATH).    Prior CV studies:   The following studies were reviewed today:    2D Echo September 28, 2018: Poor quality study.  Mild LVH.  EF 55 to 60%.  No R WMA.  GR 1 DD.  Normal valves.  Recent Hospitalizations::      January 10-11, 2020: Presented a sensation of being "wobbly "while making breakfast.  She also developed a brief loss of consciousness and then was unable to provide history.  She noted some left-sided weakness.  As the day progressed she gradually regained her strength from the point of not being to move her left side to being able to move and to speak fluently.  Stat CT scan of the head was negative and unremarkable MRI noted. -->  Diagnosis was toxic metabolic encephalopathy versus TIA.  Past Medical  History:  Diagnosis Date  . Anemia   . Aortic atherosclerosis (HCC)    CT chest  . Arthritis   . Breast cancer ([place]) 09/29/2011   s/p radiation  . Hypertension   . Ovarian cancer ([place]) 09/29/2011   s/p chemo, radiation  . Pneumonia   . Radiation 01/14/2009-02/10/2009   left breast 45 Gy, boosted to 5 Gy  . Seasonal allergies   . Vitamin D deficiency    Past Surgical History:  Procedure Laterality Date   . ABDOMINAL HYSTERECTOMY  2006  . APPENDECTOMY    . BLADDER SURGERY    . BREAST LUMPECTOMY    . BREAST LUMPECTOMY WITH RADIOACTIVE SEED LOCALIZATION Right 04/07/2015   Procedure: BREAST LUMPECTOMY WITH RADIOACTIVE SEED LOCALIZATION;  Surgeon: Erroll Luna, MD;  Location: [place];  Service: General;  Laterality: Right;  . PORT-A-CATH REMOVAL N/A 07/13/2015   Procedure: REMOVAL PORT-A-CATH;  Surgeon: Erroll Luna, MD;  Location: [place];  Service: General;  Laterality: N/A;  . TONSILLECTOMY    . TRANSTHORACIC ECHOCARDIOGRAM  09/2018   Poor quality study.  Mild LVH.  EF 55 to 60%.  No R WMA.  GR 1 DD.  Normal valves.    Current Outpatient Medications on File Prior to Visit  Medication Sig Dispense Refill  . aspirin 81 MG tablet Take 81 mg by mouth daily.    . Cholecalciferol (VITAMIN D3) 2000 UNITS TABS Take 1 capsule by mouth daily.     . Omega-3 Fatty Acids (FISH OIL CONCENTRATE) 1000 MG CAPS Take 1,000 mg by mouth daily.     . potassium chloride (K-DUR,KLOR-CON) 10 MEQ tablet Take 10 mEq by mouth daily.    Marland Kitchen Respiratory Therapy Supplies (FLUTTER) DEVI Use as directed (Patient not taking: Reported on 11/29/2018) 1 each 0  . tamoxifen (NOLVADEX) 20 MG tablet Take 1 tablet (20 mg total) by mouth daily. 90 tablet 3  . vitamin E (VITAMIN E) 400 UNIT capsule Take 400 Units by mouth 2 (two) times daily.     No current facility-administered medications on file prior to visit.       Allergies:   Chlorhexidine; Arimidex [anastrozole]; Diovan [valsartan]; and Uncoded nonscreenable allergen   Social History   Tobacco Use  . Smoking status: Former Smoker    Packs/day: 1.00    Years: 58.00    Pack years: 58.00    Types: Cigarettes    Last attempt to quit: 07/19/2017    Years since quitting: 1.3  . Smokeless tobacco: Never Used  Substance Use Topics  . Alcohol use: Yes    Alcohol/week: 1.0 standard drinks    Types: 1 Shots of liquor per week     Comment: 11/29/15 cocktail on weekends  . Drug use: No     Family Hx: The patient's family history includes Breast cancer in her paternal aunt; Cancer in her cousin; Colon cancer in her paternal aunt; Colon cancer (age of onset: 69) in her brother; Ovarian cancer (age of onset: 35) in her mother; Pancreatitis (age of onset: 27) in her brother.  ROS:   Please see the history of present illness.    Review of Systems  Constitutional: Negative for chills and fever.  HENT: Negative for congestion.   Respiratory: Negative for cough and shortness of breath.   Cardiovascular: Negative for palpitations and leg swelling.  Neurological: Negative for dizziness, speech change, focal weakness and weakness.       Since hospitalization  Psychiatric/Behavioral: Negative for depression and  hallucinations.    Labs/Other Tests and Data Reviewed:    Recent Labs: 09/27/2018: ALT 20; BUN 12; Creatinine, Ser 0.88; Hemoglobin 12.5; Platelets 101; Potassium 3.1; Sodium 142   Recent Lipid Panel Lab Results  Component Value Date/Time   CHOL 140 09/28/2018 05:28 AM   TRIG 80 09/28/2018 05:28 AM   HDL 62 09/28/2018 05:28 AM   CHOLHDL 2.3 09/28/2018 05:28 AM   LDLCALC 62 09/28/2018 05:28 AM    Wt Readings from Last 3 Encounters:  11/29/18 187 lb (84.8 kg)  09/27/18 178 lb 9.2 oz (81 kg)  01/21/18 178 lb (80.7 kg)     Exam:    Vital Signs:  There were no vitals taken for this visit.  No apparent respiratory distress noted.  ASSESSMENT & PLAN:    Problem List Items Addressed This Visit    Essential hypertension   TIA (transient ischemic attack) - Primary    Recommendations with Dr. Andrey Spearman suggested TEE and monitor/loop recorder.  For more definitive evaluation loop recorder is probably more warranted.  Unfortunately due to scheduling conflicts with current BWGYK-59 issues and restrictions, we will plan to have her do a video conference call with Dr. Sallyanne Kuster this Friday at 11 AM. During  this visit they will discuss different types of procedures including TEE and likely loop recorder.  They can also discussed the timing of when these studies will actually be able to be scheduled with Dr. Sallyanne Kuster likely being the performing provider.  She would then potentially follow-up with him going forward.         COVID-19 Education: The signs and symptoms of COVID-19 were discussed with the patient and how to seek care for testing (follow up with PCP or arrange E-visit).  The importance of social distancing was discussed today.  Patient Risk:   After full review of this patients clinical status, I feel that they are at least moderate risk at this time.  Time:   Today, I have spent 15 minutes with the patient with telehealth technology discussing the patient's symptoms and plans for further evaluation, rescheduling of studies and follow-up visit with Dr. Sallyanne Kuster.     Medication Adjustments/Labs and Tests Ordered: Current medicines are reviewed at length with the patient today.  Concerns regarding medicines are outlined above.  Tests Ordered: No orders of the defined types were placed in this encounter.  Medication Changes: No orders of the defined types were placed in this encounter.   Disposition:  in 2 day(s) -video conference visit with Dr. Sallyanne Kuster  Signed, Glenetta Hew, MD  12/11/2018 5:41 PM    [place]

## 2018-12-11 NOTE — Assessment & Plan Note (Addendum)
Recommendations with Dr. Andrey Spearman suggested TEE and monitor/loop recorder.  For more definitive evaluation loop recorder is probably more warranted.  From a cardiovascular standpoint, her lipids are well controlled on omega-3 fatty acids, and she is on aspirin.  Unfortunately due to scheduling conflicts with current EXMDY-70 issues and restrictions, we will plan to have her do a video conference call with Dr. Sallyanne Kuster this Friday at 11 AM. During this visit they will discuss different types of procedures including TEE and likely loop recorder.  They can also discussed the timing of when these studies will actually be able to be scheduled with Dr. Sallyanne Kuster likely being the performing provider.  She would then potentially follow-up with him going forward.

## 2018-12-11 NOTE — Patient Instructions (Signed)
You will be contacted by Dr. Sallyanne Kuster nurse to establish a link for virtual clinic visit with Dr. Sallyanne Kuster on Friday, March 25 11 AM.

## 2018-12-11 NOTE — Assessment & Plan Note (Signed)
Blood pressure has been well controlled per patient report

## 2018-12-12 ENCOUNTER — Telehealth: Payer: Self-pay | Admitting: Cardiovascular Disease

## 2018-12-12 NOTE — Telephone Encounter (Signed)
PATIENT STATED NEEDED DR. C TO CALL THIS PHONE NUMBER [phone] TOMORROW FOR VIRTUAL VISIT.

## 2018-12-13 ENCOUNTER — Telehealth (INDEPENDENT_AMBULATORY_CARE_PROVIDER_SITE_OTHER): Payer: Medicare Other | Admitting: Cardiovascular Disease

## 2018-12-13 ENCOUNTER — Other Ambulatory Visit: Payer: Self-pay

## 2018-12-13 ENCOUNTER — Other Ambulatory Visit: Payer: Self-pay | Admitting: Cardiovascular Disease

## 2018-12-13 ENCOUNTER — Telehealth: Payer: Self-pay

## 2018-12-13 DIAGNOSIS — G459 Transient cerebral ischemic attack, unspecified: Secondary | ICD-10-CM | POA: Diagnosis not present

## 2018-12-13 NOTE — Addendum Note (Signed)
Addended by: Diana Eves on: 12/13/2018 09:48 AM   Modules accepted: Orders

## 2018-12-13 NOTE — Telephone Encounter (Signed)
[email or web address]

## 2018-12-13 NOTE — Progress Notes (Signed)
Virtual Visit via Video Note    Evaluation Performed:  Follow-up visit  This visit type was conducted due to national recommendations for restrictions regarding the COVID-19 Pandemic (e.g. social distancing).  This format is felt to be most appropriate for this patient at this time.  All issues noted in this document were discussed and addressed.  No physical exam was performed (except for noted visual exam findings with Video Visits).  Please refer to the patient's chart (MyChart message for video visits and phone note for telephone visits) for the patient's consent to telehealth for [facility].  Date:  12/13/2018   ID:  Monique Watson, DOB Dec 18, 1940, MRN [id number]  Patient Location:  [address] [place] [address]   Provider location:   [place], [place]  PCP:  [name], MD  Cardiologist:  Glenetta Hew, MD  Electrophysiologist:  None   Chief Complaint:  TIA workup  History of Present Illness:    Monique Watson is a 78 y.o. female who presents via audio/video conferencing for a telehealth visit today.  She had a telephone visit with Dr. Ellyn Hack a few days ago, referred to [place] by Dr. Leta Baptist for TIA/syncope workup.  She has not had any new interval neurological events.  She has not had syncope or focal deficits.  She denies any problems with palpitations.  The patient specifically denies any chest pain at rest exertion, dyspnea at rest or with exertion, orthopnea, paroxysmal nocturnal dyspnea, syncope, palpitations, focal neurological deficits, intermittent claudication, lower extremity edema, unexplained weight gain, cough, hemoptysis or wheezing.  She needs TEE and loop recorder monitoring.  The patient does not symptoms concerning for COVID-19 infection (fever, chills, cough, or new SHORTNESS OF BREATH).    Prior CV studies:   The following studies were reviewed today:  Notes from admission January 2020, Dr. Leta Baptist and Dr. Ellyn Hack, imaging studies  including brain MRI and CT angio head and neck..  Past Medical History:  Diagnosis Date  . Anemia   . Aortic atherosclerosis (HCC)    CT chest  . Arthritis   . Breast cancer ([place]) 09/29/2011   s/p radiation  . Hypertension   . Ovarian cancer ([facility]) 09/29/2011   s/p chemo, radiation  . Pneumonia   . Radiation 01/14/2009-02/10/2009   left breast 45 Gy, boosted to 5 Gy  . Seasonal allergies   . Vitamin D deficiency    Past Surgical History:  Procedure Laterality Date  . ABDOMINAL HYSTERECTOMY  2006  . APPENDECTOMY    . BLADDER SURGERY    . BREAST LUMPECTOMY    . BREAST LUMPECTOMY WITH RADIOACTIVE SEED LOCALIZATION Right 04/07/2015   Procedure: BREAST LUMPECTOMY WITH RADIOACTIVE SEED LOCALIZATION;  Surgeon: Erroll Luna, MD;  Location: [facility];  Service: General;  Laterality: Right;  . PORT-A-CATH REMOVAL N/A 07/13/2015   Procedure: REMOVAL PORT-A-CATH;  Surgeon: Erroll Luna, MD;  Location: [facility];  Service: General;  Laterality: N/A;  . TONSILLECTOMY    . TRANSTHORACIC ECHOCARDIOGRAM  09/2018   Poor quality study.  Mild LVH.  EF 55 to 60%.  No R WMA.  GR 1 DD.  Normal valves.     Current Meds  Medication Sig  . aspirin 81 MG tablet Take 81 mg by mouth daily.  Marland Kitchen CALCIUM PO Take 1 tablet by mouth daily.  . Cholecalciferol (VITAMIN D3) 2000 UNITS TABS Take 1 capsule by mouth daily.   . Ferrous Sulfate (IRON PO) Take 1 tablet by mouth daily  as needed.  . Omega-3 Fatty Acids (FISH OIL CONCENTRATE) 1000 MG CAPS Take 1,000 mg by mouth daily.   . potassium chloride (K-DUR,KLOR-CON) 10 MEQ tablet Take 10 mEq by mouth daily.  . Probiotic Product (PROBIOTIC PO) Take 1 capsule by mouth daily.  Marland Kitchen Respiratory Therapy Supplies (FLUTTER) DEVI Use as directed  . tamoxifen (NOLVADEX) 20 MG tablet Take 1 tablet (20 mg total) by mouth daily.  . vitamin E (VITAMIN E) 400 UNIT capsule Take 400 Units by mouth 2 (two) times daily.     Allergies:    Chlorhexidine; Arimidex [anastrozole]; Diovan [valsartan]; and Uncoded nonscreenable allergen   Social History   Tobacco Use  . Smoking status: Former Smoker    Packs/day: 1.00    Years: 58.00    Pack years: 58.00    Types: Cigarettes    Last attempt to quit: 07/19/2017    Years since quitting: 1.4  . Smokeless tobacco: Never Used  Substance Use Topics  . Alcohol use: Yes    Alcohol/week: 1.0 standard drinks    Types: 1 Shots of liquor per week    Comment: 11/29/15 cocktail on weekends  . Drug use: No     Family Hx: The patient's family history includes Breast cancer in her paternal aunt; Cancer in her cousin; Colon cancer in her paternal aunt; Colon cancer (age of onset: 72) in her brother; Ovarian cancer (age of onset: 66) in her mother; Pancreatitis (age of onset: 91) in her brother.  ROS:   Please see the history of present illness.     All other systems reviewed and are negative.   Labs/Other Tests and Data Reviewed:    Recent Labs: 09/27/2018: ALT 20; BUN 12; Creatinine, Ser 0.88; Hemoglobin 12.5; Platelets 101; Potassium 3.1; Sodium 142   Recent Lipid Panel Lab Results  Component Value Date/Time   CHOL 140 09/28/2018 05:28 AM   TRIG 80 09/28/2018 05:28 AM   HDL 62 09/28/2018 05:28 AM   CHOLHDL 2.3 09/28/2018 05:28 AM   LDLCALC 62 09/28/2018 05:28 AM    Wt Readings from Last 3 Encounters:  11/29/18 187 lb (84.8 kg)  09/27/18 178 lb 9.2 oz (81 kg)  01/21/18 178 lb (80.7 kg)     Exam:    Vital Signs:  There were no vitals taken for this visit.   Well nourished, well developed female in no acute distress. She appears well, comfortable and smiling.  ASSESSMENT & PLAN:    1.  TIA: We discussed further work-up for her episodes of possible TIA.  Whether or not she had syncope is unclear, but a loop recorder will cover both evaluation for atrial fibrillation and bradycardia arrhythmia that could cause syncope.  Reviewed the need for transesophageal echo and the  plan to proceed with loop recorder if the TEE does not answer the question.  Both procedures and the need for moderate sedation with TEE were discussed in detail and all of her questions were answered. This procedure has been fully reviewed with the patient and informed consent has been obtained.  She understands that due to the current healthcare concerns the procedures may be delayed for several more weeks.   COVID-19 Education: The signs and symptoms of COVID-19 were discussed with the patient and how to seek care for testing (follow up with PCP or arrange E-visit).  The importance of social distancing was discussed today.  Patient Risk:   After full review of this patients clinical status, I feel that they are at  least moderate risk at this time.  Time:   Today, I have spent 27 minutes with the patient with telehealth technology discussing TIA, possible atrial fibrillation, transesophageal echocardiography, implantable loop recorder risks and benefits.     Medication Adjustments/Labs and Tests Ordered: Current medicines are reviewed at length with the patient today.  Concerns regarding medicines are outlined above.  Tests Ordered: No orders of the defined types were placed in this encounter.  Medication Changes: No orders of the defined types were placed in this encounter.   Disposition:  We will schedule for TEE and loop recorder on April 27  Signed, Sanda Klein, MD  12/13/2018 8:55 AM    [facility]

## 2019-01-12 ENCOUNTER — Other Ambulatory Visit: Payer: Self-pay | Admitting: Hematology and Oncology

## 2019-01-13 ENCOUNTER — Encounter (HOSPITAL_COMMUNITY): Admission: RE | Payer: Self-pay | Source: Home / Self Care

## 2019-01-13 ENCOUNTER — Ambulatory Visit (HOSPITAL_COMMUNITY): Admission: RE | Admit: 2019-01-13 | Payer: Medicare Other | Source: Home / Self Care | Admitting: Cardiovascular Disease

## 2019-01-13 SURGERY — ECHOCARDIOGRAM, TRANSESOPHAGEAL
Anesthesia: Moderate Sedation

## 2019-01-13 SURGERY — LOOP RECORDER INSERTION

## 2019-01-13 NOTE — Assessment & Plan Note (Signed)
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  I recommended that she resume tamoxifen therapy and I sent a new prescription today.  1.BRCA 2 mutation:Patient needs annual mammograms and MRIs.  Patient has not had an MRI in 2 years.  We have had multiple orders for breast MRIs but they have not been done.  I will set her up for another MRI within the next few weeks. 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 She will call if bleeding or bruising.  Surveillance: 1.Bilateral breast MRI 02/20/2018: No evidence of malignancy 2.mammogram  April 2019at [facility]: Benign 3.Breast exam 01/16/2018: Benign  I ordered a new breast MRI but I stressed the importance of doing annual breast MRIs.  Return to clinic in 1 year for follow-up

## 2019-01-15 ENCOUNTER — Telehealth: Payer: Self-pay | Admitting: Hematology and Oncology

## 2019-01-15 NOTE — Telephone Encounter (Signed)
Called regarding upcoming Webex appointment, patient's son will help set up Webex meeting. E-mail has been sent.

## 2019-01-16 NOTE — Progress Notes (Signed)
HEMATOLOGY-ONCOLOGY [name]  I connected with Monique Watson on 01/17/2019 at 10:15 AM EDT by Webex video conference and verified that I am speaking with the correct person using two identifiers.  I discussed the limitations, risks, security and privacy concerns of performing an evaluation and management service by Webex and the availability of in person appointments.  I also discussed with the patient that there may be a patient responsible charge related to this service. The patient expressed understanding and agreed to proceed.  Patient's Location: Home Physician Location: Clinic  CHIEF COMPLIANT: Follow-up of recurrent breast cancer on tamoxifen  INTERVAL HISTORY: Monique Watson is a 78 y.o. 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 Her most recent breast MRI on 02/20/18 showed no evidence of malignancy. I last saw her a year ago. In the interim she presented to the ED on 09/27/18 for stroke symptoms and diagnosed with metabolic encephalopathy. She presents today over Webex for annual follow-up.  She denies any pain or discomfort in the breast.  Denies any lumps or nodules.  Oncology History   3/3/Multifocal T1N0 left breast at lumpectomy and sentinel node evaluation March 2010. ER PR +, Her2 negative. 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 Intolerant to Femara and Aromasin, Tamoxifen    08/31/2014 Procedure   Genetic testing: Mutation at [facility], [id number]    01/26/2015 Breast MRI    There is a 5 x 9 x 8 mm mass enhancement at the slight lower slight medial right breast anterior to middle depth with plateau enhancement kinetics.    02/09/2015 Initial Biopsy    Right breast core needle bx: negative for malignancy     02/24/2015 Procedure    Right breast core needle bx: DCIS with comedonecrosis, ER+ (95%), PR- (0%)    02/24/2015 Clinical Stage    Stage 0: Tis N0    04/07/2015 Definitive Surgery    Right lumpectomy: DCIS, intermediate grade, with necrosis; close lateral margin    04/07/2015 Pathologic Stage    Stage 0: pTis pNx    05/17/2015 - 06/14/2015 Radiation Therapy    Adjuvant RT([place]): Right breast 42.5 Gy over 17 fractions; right breast boost 7.5 Gy over 3 fractions. Total dose: 50 Gy    09/02/2015 Survivorship    Survivorship care plan completed and mailed to patient in lieu of in person visit.     REVIEW OF SYSTEMS:   Constitutional: Denies fevers, chills or abnormal weight loss Eyes: Denies blurriness of vision Ears, nose, mouth, throat, and face: Denies mucositis or sore throat Respiratory: Denies cough, dyspnea or wheezes Cardiovascular: Denies palpitation, chest discomfort Gastrointestinal:  Denies nausea, heartburn or change in bowel habits Skin: Denies abnormal skin rashes Lymphatics: Denies new lymphadenopathy or easy bruising Neurological:Denies numbness, tingling or new weaknesses Behavioral/Psych: Mood is stable, no new changes  Extremities: No lower extremity edema Breast: denies any pain or lumps or nodules in either  breasts All other systems were reviewed with the patient and are negative.  Observations/Objective:  There were no vitals filed for this visit. There is no height or weight on file to calculate BMI.  I have reviewed the data as listed CMP Latest Ref Rng & Units 09/27/2018 09/27/2018 09/27/2018  Glucose 70 - 99 mg/dL - 141(H) 139(H)  BUN 8 - 23  mg/dL - 12 11  Creatinine 0.44 - 1.00 mg/dL 0.88 0.70 0.90  Sodium 135 - 145 mmol/L - 142 141  Potassium 3.5 - 5.1 mmol/L - 3.1(L) 3.1(L)  Chloride 98 - 111 mmol/L - 109 108  CO2 22 - 32 mmol/L - - 22  Calcium 8.9 - 10.3 mg/dL - - 9.0  Total Protein 6.5 - 8.1 g/dL - - 7.4  Total Bilirubin 0.3 - 1.2 mg/dL - - 0.8  Alkaline Phos 38 - 126 U/L - - 50  AST 15 - 41 U/L - - 33  ALT 0 - 44 U/L - - 20    Lab Results  Component Value Date   WBC 5.9 09/27/2018   HGB 12.5 09/27/2018   HCT 38.0 09/27/2018   MCV 98.2 09/27/2018   PLT 101 (L) 09/27/2018   NEUTROABS 3.6 09/27/2018      Assessment Plan:  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  I recommended that she resume tamoxifen therapy and I sent a new prescription today.  1.BRCA 2 mutation:Patient needs annual mammograms and MRIs.  Patient has not had an MRI in 2 years.  We have had multiple orders for breast MRIs but they have not been done.  I will set her up for another MRI within the next few weeks. 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 She will call if bleeding or bruising.  Surveillance: 1.Bilateral breast MRI 02/20/2018: No evidence of malignancy, next breast MRI will be ordered for November 2020 2.mammogram   scheduled for next week at [facility] 3.Breast exam 01/16/2018: Benign  I ordered a new breast MRI but I stressed the importance of doing annual breast MRIs.  Patient tells me that her blood pressure has been running high lately.  I  instructed her to call her primary care physician and get the home blood pressure cuff validated.  Return to clinic in 1 year for follow-up  I discussed the assessment and treatment plan with the patient. The patient was provided an opportunity to ask questions and all were answered. The patient agreed with the plan and demonstrated an understanding of the instructions. The patient was advised to call back or seek an in-person evaluation if the symptoms worsen or if the condition fails to improve as anticipated.   I provided 15 minutes of face-to-face Web Ex time during this encounter.    Rulon Eisenmenger, MD 01/17/2019   I, Molly Dorshimer, am acting as scribe for Nicholas Lose, MD.  I have reviewed the above documentation for accuracy and completeness, and I agree with the above.

## 2019-01-17 ENCOUNTER — Encounter: Payer: Self-pay | Admitting: General Practice

## 2019-01-17 ENCOUNTER — Inpatient Hospital Stay: Payer: Medicare Other | Attending: Hematology and Oncology | Admitting: Hematology and Oncology

## 2019-01-17 DIAGNOSIS — C50311 Malignant neoplasm of lower-inner quadrant of right female breast: Secondary | ICD-10-CM | POA: Diagnosis not present

## 2019-01-17 DIAGNOSIS — Z1231 Encounter for screening mammogram for malignant neoplasm of breast: Secondary | ICD-10-CM | POA: Diagnosis not present

## 2019-01-17 DIAGNOSIS — Z170 Estrogen receptor positive status [ER+]: Secondary | ICD-10-CM

## 2019-01-17 NOTE — Progress Notes (Signed)
[facility] CSW Progress Note  Adv Directives packet mailed, patient asked to contact CSW to complete after pandemic restrictions are lifted.  RN reminded of option to complete MOST form in the meantime.  Edwyna Shell, LCSW Clinical Social Worker Phone:  [phone]

## 2019-01-20 ENCOUNTER — Telehealth: Payer: Self-pay

## 2019-01-20 NOTE — Telephone Encounter (Signed)
  [facility] at [place], [place]  [place], [place] [address]  Phone: [phone] Fax: [phone]   You are scheduled for a TEE and Loop Recorder Implant  on Thursday 02/20/19 with Dr. Sallyanne Kuster.   Please arrive at the [facility] at 11:30 am on the day of your procedure.    You do not need to be fasting.   You do not need pre-procedure lab work or testing.   Please bring your insurance cards and a list of your medications with you.   Wash your chest and neck with the surgical soap provided the evening before and the morning of your procedure. Please following the washing instructions provided.   There aren't any restrictions after the procedure. You will receive wound care instructions upon discharge.  If you have any questions after you get home, please call the office at [phone].   Thank you, Sanda Klein, MD   * Special note:  Every effort is made to have your procedure done on time.  Occasionally there are emergencies that present themselves at the hospital that may cause delays.  Please be patient if a delay does occur.  Preparing for Surgery  Before surgery, you can play an important role. Because skin is not sterile, your skin needs to be as free of germs as possible. You can reduce the number of germs on your skin by washing with CHG (chlorhexidine gluconate) Soap before surgery. CHG is an antiseptic cleaner which kills germs and bonds with the skin to continue killing germs even after washing.  Please do not use if you have an allergy to CHG or antibacterial soaps. If your skin becomes reddened/irritated, STOP using the CHG.  DO NOT SHAVE (including legs and underarms) for at least 48 hours prior to first CHG shower. It is OK to shave your face.  Please follow these instructions carefully: 1. Shower the night before surgery and the morning  of surgery with CHG Soap. 2. If you chose to wash your hair, wash your hair first as usual with your normal shampoo/conditioner. 3. After you shampoo/condition, rinse you hair and body thoroughly to remove shampoo/conditioner. 4. Use CHG as you would any other liquid soap. You can apply CHG directly to the skin and wash gently with a loofah or a clean washcloth. 5. Apply the CHG Soap to your body ONLY FROM THE NECK DOWN. Do not use on open wounds or open sores. Avoid contact with your eyes, ears, mouth, and genitals (private parts). Wash genitals (private part) with your normal soap. 6. Wash thoroughly, paying special attention to the area where your surgery will be performed. 7. Thoroughly rinse your body with warm water from the neck down. 8. DO NOT shower/wash with your normal soap after using and rinsing off the CHG Soap. 9. Pat yourself dry with a clean towel. 10. Wear clean pajamas to bed. 11. Place clean sheets on your bed the night of your first shower and do not sleep with pets..  Day of Surgery: Shower with the CHG Soap following the instructions listed above. DO NOT apply deodorants or lotions. Please wear clean clothes to the hospital/surgery center.

## 2019-01-20 NOTE — Telephone Encounter (Signed)
Opened in error

## 2019-02-05 ENCOUNTER — Encounter: Payer: Self-pay | Admitting: *Deleted

## 2019-02-05 ENCOUNTER — Telehealth: Payer: Self-pay | Admitting: *Deleted

## 2019-02-05 DIAGNOSIS — G459 Transient cerebral ischemic attack, unspecified: Secondary | ICD-10-CM

## 2019-02-05 DIAGNOSIS — Z01818 Encounter for other preprocedural examination: Secondary | ICD-10-CM

## 2019-02-05 NOTE — Telephone Encounter (Signed)
Attempted to reach the patient. The voicemail was full.

## 2019-02-05 NOTE — Telephone Encounter (Signed)
Call placed to the patient to go over the instructions for her TEE and Loop recorder implant procedure. Was unable to leave a message. Will attempt again later.

## 2019-02-06 NOTE — Telephone Encounter (Signed)
Attempted to reach the patient to discuss the upcoming procedure. Unable to leave a voicemail.

## 2019-02-07 NOTE — Telephone Encounter (Signed)
Attempted to reach the patient again with no success. Will try to call the daughter.

## 2019-02-11 NOTE — Telephone Encounter (Signed)
Spoke with the patient to go over her pre-procedure instructions scheduled for 02/20/2019.   She has been advised that her instructions will be mailed to her as well. She is allergic to Chlorhexidine so will not be able to use the pre procedure soap scrub.  Dear Ms. Web,  You are scheduled for a TEE and Loop Recorder implant on 02/20/2019 with Dr. Sallyanne Kuster.  Please arrive at the [facility] at [facility]: [address] [place], [place] [address] at 11 am. (1 hour prior to procedure)  DIET: Nothing to eat or drink after midnight except a sip of water with medications (see medication instructions below)  Medication Instructions: Please continue your current medications Labs:  You will need to have a BMET and CBC drawn on 02/17/2019 at the [facility] office. You do not need an appointment for this and you do not need to be fasting.   You will also need to have a coronavirus test completed. This is by appointment only. One has been made for 02/17/2019 at 11:30 am. This will be done at [facility] center. This is a drive thru test only. Please be sure to have BMET and CBC done prior to the corona virus test because you will need to be quarantined after the test until your procedure on 02/20/2019.  She has been advised to call back if she has any questions.      COVID-19 Pre-Screening Questions:  . In the past 7 to 10 days have you had a cough,  shortness of breath, headache, congestion, fever (100 or greater) body aches, chills, sore throat, or sudden loss of taste or sense of smell? No . Have you been around anyone with known Covid 19. No . Have you been around anyone who is awaiting Covid 19 test results in the past 7 to 10 days? No . Have you been around anyone who has been exposed to Covid 19, or has mentioned symptoms of Covid 19 within the past 7 to 10 days? No  If you have any concerns/questions about symptoms patients report during screening (either  on the phone or at threshold). Contact the provider seeing the patient or DOD for further guidance.  If neither are available contact a member of the leadership team.

## 2019-02-11 NOTE — Telephone Encounter (Signed)
Left a message with the patient's daughter to call back.

## 2019-02-17 ENCOUNTER — Other Ambulatory Visit (HOSPITAL_COMMUNITY)
Admission: RE | Admit: 2019-02-17 | Discharge: 2019-02-17 | Disposition: A | Discharge status: Home / Self Care | Payer: Medicare Other | Source: Ambulatory Visit | Attending: Cardiovascular Disease | Admitting: Cardiovascular Disease

## 2019-02-17 DIAGNOSIS — Z1159 Encounter for screening for other viral diseases: Secondary | ICD-10-CM | POA: Diagnosis not present

## 2019-02-17 DIAGNOSIS — G459 Transient cerebral ischemic attack, unspecified: Secondary | ICD-10-CM | POA: Diagnosis not present

## 2019-02-18 ENCOUNTER — Encounter: Payer: Self-pay | Admitting: Hematology and Oncology

## 2019-02-18 DIAGNOSIS — Z853 Personal history of malignant neoplasm of breast: Secondary | ICD-10-CM | POA: Diagnosis not present

## 2019-02-18 LAB — BASIC METABOLIC PANEL
BUN/Creatinine Ratio: 12 (ref 12–28)
BUN: 11 mg/dL (ref 8–27)
CO2: 23 mmol/L (ref 20–29)
Calcium: 9 mg/dL (ref 8.7–10.3)
Chloride: 105 mmol/L (ref 96–106)
Creatinine, Ser: 0.9 mg/dL (ref 0.57–1.00)
GFR calc Af Amer: 70 mL/min/{1.73_m2} (ref >59)
GFR calc non Af Amer: 61 mL/min/{1.73_m2} (ref >59)
Glucose: 87 mg/dL (ref 65–99)
Potassium: 4 mmol/L (ref 3.5–5.2)
Sodium: 145 mmol/L — ABNORMAL HIGH (ref 134–144)

## 2019-02-18 LAB — CBC
Hematocrit: 39.7 % (ref 34.0–46.6)
Hemoglobin: 13.2 g/dL (ref 11.1–15.9)
MCH: 33 pg (ref 26.6–33.0)
MCHC: 33.2 g/dL (ref 31.5–35.7)
MCV: 99 fL — ABNORMAL HIGH (ref 79–97)
Platelets: 100 10*3/uL — CL (ref 150–450)
RBC: 4 x10E6/uL (ref 3.77–5.28)
RDW: 13.1 % (ref 11.7–15.4)
WBC: 5.4 10*3/uL (ref 3.4–10.8)

## 2019-02-19 LAB — NOVEL CORONAVIRUS, NAA (HOSP ORDER, SEND-OUT TO REF LAB; TAT 18-24 HRS): SARS-CoV-2, NAA: NOT DETECTED

## 2019-02-20 ENCOUNTER — Encounter (HOSPITAL_COMMUNITY): Payer: Self-pay | Admitting: *Deleted

## 2019-02-20 ENCOUNTER — Telehealth: Payer: Self-pay | Admitting: Cardiovascular Disease

## 2019-02-20 ENCOUNTER — Encounter (HOSPITAL_COMMUNITY)
Admission: RE | Disposition: A | Discharge status: Home / Self Care | Payer: Self-pay | Source: Home / Self Care | Attending: Cardiovascular Disease

## 2019-02-20 ENCOUNTER — Ambulatory Visit (HOSPITAL_BASED_OUTPATIENT_CLINIC_OR_DEPARTMENT_OTHER)
Admission: RE | Admit: 2019-02-20 | Discharge: 2019-02-20 | Disposition: A | Discharge status: Home / Self Care | Payer: Medicare Other | Source: Home / Self Care | Attending: Cardiovascular Disease | Admitting: Cardiovascular Disease

## 2019-02-20 ENCOUNTER — Ambulatory Visit (HOSPITAL_COMMUNITY)
Admission: RE | Admit: 2019-02-20 | Discharge: 2019-02-20 | Disposition: A | Discharge status: Home / Self Care | Payer: Medicare Other | Attending: Cardiovascular Disease | Admitting: Cardiovascular Disease

## 2019-02-20 DIAGNOSIS — I10 Essential (primary) hypertension: Secondary | ICD-10-CM | POA: Diagnosis not present

## 2019-02-20 DIAGNOSIS — Z87891 Personal history of nicotine dependence: Secondary | ICD-10-CM | POA: Insufficient documentation

## 2019-02-20 DIAGNOSIS — Z90710 Acquired absence of both cervix and uterus: Secondary | ICD-10-CM | POA: Insufficient documentation

## 2019-02-20 DIAGNOSIS — Z923 Personal history of irradiation: Secondary | ICD-10-CM | POA: Insufficient documentation

## 2019-02-20 DIAGNOSIS — M1990 Unspecified osteoarthritis, unspecified site: Secondary | ICD-10-CM | POA: Diagnosis not present

## 2019-02-20 DIAGNOSIS — Z803 Family history of malignant neoplasm of breast: Secondary | ICD-10-CM | POA: Diagnosis not present

## 2019-02-20 DIAGNOSIS — E559 Vitamin D deficiency, unspecified: Secondary | ICD-10-CM | POA: Insufficient documentation

## 2019-02-20 DIAGNOSIS — G459 Transient cerebral ischemic attack, unspecified: Secondary | ICD-10-CM | POA: Diagnosis not present

## 2019-02-20 DIAGNOSIS — Z808 Family history of malignant neoplasm of other organs or systems: Secondary | ICD-10-CM | POA: Insufficient documentation

## 2019-02-20 DIAGNOSIS — Z79899 Other long term (current) drug therapy: Secondary | ICD-10-CM | POA: Diagnosis not present

## 2019-02-20 DIAGNOSIS — I700 Atherosclerosis of aorta: Secondary | ICD-10-CM | POA: Insufficient documentation

## 2019-02-20 DIAGNOSIS — Z8543 Personal history of malignant neoplasm of ovary: Secondary | ICD-10-CM | POA: Insufficient documentation

## 2019-02-20 DIAGNOSIS — Z7982 Long term (current) use of aspirin: Secondary | ICD-10-CM | POA: Insufficient documentation

## 2019-02-20 DIAGNOSIS — I639 Cerebral infarction, unspecified: Secondary | ICD-10-CM | POA: Diagnosis not present

## 2019-02-20 DIAGNOSIS — Z853 Personal history of malignant neoplasm of breast: Secondary | ICD-10-CM | POA: Insufficient documentation

## 2019-02-20 DIAGNOSIS — Z8041 Family history of malignant neoplasm of ovary: Secondary | ICD-10-CM | POA: Diagnosis not present

## 2019-02-20 DIAGNOSIS — Z888 Allergy status to other drugs, medicaments and biological substances status: Secondary | ICD-10-CM | POA: Insufficient documentation

## 2019-02-20 DIAGNOSIS — Z9221 Personal history of antineoplastic chemotherapy: Secondary | ICD-10-CM | POA: Insufficient documentation

## 2019-02-20 HISTORY — PX: BUBBLE STUDY: SHX6837

## 2019-02-20 HISTORY — PX: LOOP RECORDER INSERTION: EP1214

## 2019-02-20 HISTORY — PX: TEE WITHOUT CARDIOVERSION: SHX5443

## 2019-02-20 SURGERY — LOOP RECORDER INSERTION

## 2019-02-20 SURGERY — ECHOCARDIOGRAM, TRANSESOPHAGEAL
Anesthesia: Moderate Sedation

## 2019-02-20 MED ORDER — MIDAZOLAM HCL (PF) 10 MG/2ML IJ SOLN
INTRAMUSCULAR | Status: DC | PRN
  Administered 2019-02-20: 1 mg via INTRAVENOUS
  Administered 2019-02-20: 2 mg via INTRAVENOUS
  Administered 2019-02-20 (×2): 1 mg via INTRAVENOUS

## 2019-02-20 MED ORDER — FENTANYL CITRATE (PF) 100 MCG/2ML IJ SOLN
INTRAMUSCULAR | Status: AC
  Filled 2019-02-20: qty 4

## 2019-02-20 MED ORDER — DIPHENHYDRAMINE HCL 50 MG/ML IJ SOLN
INTRAMUSCULAR | Status: AC
  Filled 2019-02-20: qty 1

## 2019-02-20 MED ORDER — LIDOCAINE-EPINEPHRINE 1 %-1:100000 IJ SOLN
INTRAMUSCULAR | Status: DC | PRN
  Administered 2019-02-20: 10 mL

## 2019-02-20 MED ORDER — LIDOCAINE-EPINEPHRINE 1 %-1:100000 IJ SOLN
INTRAMUSCULAR | Status: AC
  Filled 2019-02-20: qty 1

## 2019-02-20 MED ORDER — FENTANYL CITRATE (PF) 100 MCG/2ML IJ SOLN
INTRAMUSCULAR | Status: DC | PRN
  Administered 2019-02-20 (×2): 25 ug via INTRAVENOUS

## 2019-02-20 MED ORDER — SODIUM CHLORIDE 0.9 % IV SOLN
INTRAVENOUS | Status: AC | PRN
  Administered 2019-02-20: 500 mL via INTRAVENOUS

## 2019-02-20 MED ORDER — BUTAMBEN-TETRACAINE-BENZOCAINE 2-2-14 % EX AERO
INHALATION_SPRAY | CUTANEOUS | Status: DC | PRN
  Administered 2019-02-20: 1 via TOPICAL

## 2019-02-20 MED ORDER — SODIUM CHLORIDE 0.9 % IV SOLN: INTRAVENOUS | Status: DC

## 2019-02-20 MED ORDER — MIDAZOLAM HCL (PF) 5 MG/ML IJ SOLN
INTRAMUSCULAR | Status: AC
  Filled 2019-02-20: qty 2

## 2019-02-20 SURGICAL SUPPLY — 2 items
LOOP REVEAL LINQSYS (Prosthesis & Implant Heart) ×2 IMPLANT
PACK LOOP INSERTION (CUSTOM PROCEDURE TRAY) ×2 IMPLANT

## 2019-02-20 NOTE — Op Note (Addendum)
LOOP RECORDER IMPLANT   Procedure report  Procedure performed:  Loop recorder implantation   Reason for procedure:  1.  Cryptogenic TIA Procedure performed by:  Sanda Klein, MD  Complications:  None  Estimated blood loss:  <5 mL  Medications administered during procedure:  Lidocaine 1% with 1/10,000 epinephrine 10 mL locally Device details:  Medtronic Reveal Linq model number [id number], serial number [id number] Procedure details:  After the risks and benefits of the procedure were discussed the patient provided informed consent. The patient was prepped and draped in usual sterile fashion. Local anesthesia was administered to an area 2 cm to the left of the sternum in the 4th intercostal space. A cutaneous incision was made using the incision tool. The introducer was then used to create a subcutaneous tunnel and carefully deploy the device. Local pressure was held to ensure hemostasis.  The incision was closed with SteriStrips and a sterile dressing was applied.  R waves 1.0 mV.  Sanda Klein, MD, [facility] [phone] office [phone] pager 02/20/2019 1:17 PM

## 2019-02-20 NOTE — Telephone Encounter (Signed)
New Message    Remo Lipps is returning call for Monique Watson   Please call back

## 2019-02-20 NOTE — Discharge Instructions (Signed)
Transesophageal Echocardiogram Transesophageal echocardiogram (TEE) is a test that uses sound waves to take pictures of your heart. TEE is done by passing a flexible tube down the esophagus. The esophagus is the tube that carries food from the throat to the stomach. The pictures give detailed images of your heart. This can help your doctor see if there are problems with your heart.  What happens after the procedure?   Your blood pressure, heart rate, breathing rate, and blood oxygen level will be watched until the medicines you were given have worn off.  When you first wake up, your throat may feel sore and numb. This will get better over time. You will not be allowed to eat or drink until the numbness has gone away.  Do not drive for 24 hours if you were given a medicine to help you relax. Summary  TEE is a test that uses sound waves to take pictures of your heart.  You will be given a medicine to help you relax.  Do not drive for 24 hours if you were given a medicine to help you relax. This information is not intended to replace advice given to you by your health care provider. Make sure you discuss any questions you have with your health care provider. Document Released: 07/02/2009 Document Revised: 05/24/2018 Document Reviewed: 12/06/2016 Elsevier Interactive Patient Education  2019 [employer].

## 2019-02-20 NOTE — Op Note (Signed)
INDICATIONS: TIA  PROCEDURE:   Informed consent was obtained prior to the procedure. The risks, benefits and alternatives for the procedure were discussed and the patient comprehended these risks.  Risks include, but are not limited to, cough, sore throat, vomiting, nausea, somnolence, esophageal and stomach trauma or perforation, bleeding, low blood pressure, aspiration, pneumonia, infection, trauma to the teeth and death.    After a procedural time-out, the oropharynx was anesthetized with 20% benzocaine spray.   During this procedure the patient was administered a total of Versed 4 mg and Fentanyl 50 mcg IV to achieve and maintain moderate conscious sedation.  The patient's heart rate, blood pressure, and oxygen saturationweare monitored continuously during the procedure. The period of conscious sedation was 15 minutes, of which I was present face-to-face 100% of this time.  The transesophageal probe was inserted in the esophagus and stomach without difficulty and multiple views were obtained.  The patient was kept under observation until the patient left the procedure room.  The patient left the procedure room in stable condition.   Agitated microbubble saline contrast was administered.  COMPLICATIONS:    There were no immediate complications.  FINDINGS:  No cardioembolic source. Moderate atherosclerosis, distal to the aortic arch.  RECOMMENDATIONS:     ILR for cryptogenic TIA, possible atrial fibrillation.  Time Spent Directly with the Patient:  30 minutes   Favor Kreh 02/20/2019, 1:16 PM

## 2019-02-20 NOTE — Telephone Encounter (Signed)
Returned call to Monique Watson-[facility]) she states that she sis returning a call. I do not see a call made today and Monique Watson is off today so the call must be from yesterday. Daughter took pt to loop insertion and was just making sure that nothing was wrong

## 2019-02-20 NOTE — H&P (Signed)
Cardiology Admission History and Physical:   Patient ID: Monique Watson MRN: [id number]; DOB: 01/21/1941   Admission date: 02/20/2019  Primary Care Provider: [name], MD Primary Cardiologist: Glenetta Hew, MD  Primary Electrophysiologist:  None   Chief Complaint: Evaluation for cryptogenic stroke/TIA  Patient Profile:   Monique Watson is a 78 y.o. female with an episode of focal neurological deficit in January, referred to cardiology by Dr. Leta Baptist to evaluate for possible cardioembolic events.  History of Present Illness:   Monique Watson has not had any neurological episodes since the initial event in January, when she had left-sided weakness, left neglect and gaze deviation, which improved spontaneously.  She also had vision loss and transient a aphasia.  CT angiogram of the head and neck showed no large vessel occlusion and MRI of the brain was negative for stroke. Aortic atherosclerosis was noted on CT angiography.  Dr. Leta Baptist suspected that she may have had simultaneous right parietal, bilateral occipital and brainstem events and recommended further evaluation with TEE and loop recorder implantation.  This has been delayed for a couple of months due to the coronavirus pandemic.  She is here to have these procedures performed today.   Past Medical History:  Diagnosis Date  . Anemia   . Aortic atherosclerosis (HCC)    CT chest  . Arthritis   . Breast cancer ([place]) 09/29/2011   s/p radiation  . Hypertension   . Ovarian cancer ([place]) 09/29/2011   s/p chemo, radiation  . Pneumonia   . Radiation 01/14/2009-02/10/2009   left breast 45 Gy, boosted to 5 Gy  . Seasonal allergies   . Vitamin D deficiency     Past Surgical History:  Procedure Laterality Date  . ABDOMINAL HYSTERECTOMY  2006  . APPENDECTOMY    . BLADDER SURGERY    . BREAST LUMPECTOMY    . BREAST LUMPECTOMY WITH RADIOACTIVE SEED LOCALIZATION Right 04/07/2015   Procedure: BREAST LUMPECTOMY WITH  RADIOACTIVE SEED LOCALIZATION;  Surgeon: Erroll Luna, MD;  Location: [place];  Service: General;  Laterality: Right;  . PORT-A-CATH REMOVAL N/A 07/13/2015   Procedure: REMOVAL PORT-A-CATH;  Surgeon: Erroll Luna, MD;  Location: [place];  Service: General;  Laterality: N/A;  . TONSILLECTOMY    . TRANSTHORACIC ECHOCARDIOGRAM  09/2018   Poor quality study.  Mild LVH.  EF 55 to 60%.  No R WMA.  GR 1 DD.  Normal valves.     Medications Prior to Admission: Prior to Admission medications   Medication Sig Start Date End Date Taking? Authorizing Provider  albuterol (VENTOLIN HFA) 108 (90 Base) MCG/ACT inhaler Inhale 1-2 puffs into the lungs every 6 (six) hours as needed for wheezing or shortness of breath.   Yes [provider]  aspirin 81 MG tablet Take 81 mg by mouth daily.   Yes [provider]  CALCIUM PO Take 1 tablet by mouth daily.   Yes [provider]  Cholecalciferol (VITAMIN D3) 2000 UNITS TABS Take 2,000 Units by mouth daily.    Yes [provider]  Omega-3 Fatty Acids (FISH OIL CONCENTRATE) 1000 MG CAPS Take 1,000 mg by mouth daily.    Yes [provider]  potassium chloride (K-DUR,KLOR-CON) 10 MEQ tablet Take 10 mEq by mouth daily.   Yes [provider]  Probiotic Product (PROBIOTIC PO) Take 1 capsule by mouth daily.   Yes [provider]  tamoxifen (NOLVADEX) 20 MG tablet TAKE 1 TABLET BY MOUTH EVERY DAY 01/13/19  Yes Nicholas Lose, MD  vitamin E (VITAMIN E) 400 UNIT capsule Take 400 Units by mouth daily.    Yes [provider]  Respiratory Therapy Supplies (FLUTTER) DEVI Use as directed 10/22/17   Tanda Rockers, MD     Allergies:    Allergies  Allergen Reactions  . Chlorhexidine Anaphylaxis, Hives, Shortness Of Breath and Itching  . Arimidex [Anastrozole] Other (See Comments)    Body aches per patient. Irven Baltimore, RN.  Marland Kitchen Diovan [Valsartan] Other (See Comments)     arthralgias  . Uncoded Nonscreenable Allergen     Blood pressure medication patient stated that she has a low tolerance for    Social History:   Social History   Socioeconomic History  . Marital status: Married    Spouse name: Juanda Crumble  . Number of children: 2  . Years of education: 19  . Highest education level: Not on file  Occupational History    Comment: retired  Scientific laboratory technician  . Financial resource strain: Not on file  . Food insecurity:    Worry: Not on file    Inability: Not on file  . Transportation needs:    Medical: Not on file    Non-medical: Not on file  Tobacco Use  . Smoking status: Former Smoker    Packs/day: 1.00    Years: 58.00    Pack years: 58.00    Types: Cigarettes    Last attempt to quit: 07/19/2017    Years since quitting: 1.5  . Smokeless tobacco: Never Used  Substance and Sexual Activity  . Alcohol use: Yes    Alcohol/week: 1.0 standard drinks    Types: 1 Shots of liquor per week    Comment: 11/29/15 cocktail on weekends  . Drug use: No  . Sexual activity: Yes    Birth control/protection: Post-menopausal  Lifestyle  . Physical activity:    Days per week: Not on file    Minutes per session: Not on file  . Stress: Not on file  Relationships  . Social connections:    Talks on phone: Not on file    Gets together: Not on file    Attends religious service: Not on file    Active member of club or organization: Not on file    Attends meetings of clubs or organizations: Not on file    Relationship status: Not on file  . Intimate partner violence:    Fear of current or ex partner: Not on file    Emotionally abused: Not on file    Physically abused: Not on file    Forced sexual activity: Not on file  Other Topics Concern  . Not on file  Social History Narrative   Lives with husband   Caffeine use- coffee 2 cups daily, sometimes tea    Family History:   The patient's family history includes Breast cancer in her paternal aunt; Cancer in her  cousin; Colon cancer in her paternal aunt; Colon cancer (age of onset: 68) in her brother; Ovarian cancer (age of onset: 69) in her mother; Pancreatitis (age of onset: 16) in her brother.    ROS:  Please see the history of present illness.  All other ROS reviewed and negative.     Physical Exam/Data:   Vitals:   02/20/19 1145  BP: (!) 216/79  Resp: 16  Temp: 98.7 F (37.1 C)  TempSrc: Oral  SpO2: 99%  Weight: 85.3 kg  Height: 5\' 6"  (1.676 m)   No intake or output  data in the 24 hours ending 02/20/19 1150 Last 3 Weights 02/20/2019 12/13/2018 11/29/2018  Weight (lbs) 188 lb 184 lb 187 lb  Weight (kg) 85.276 kg 83.462 kg 84.823 kg     Body mass index is 30.34 kg/m.  General:  Well nourished, well developed, in no acute distress HEENT: normal Lymph: no adenopathy Neck: no JVD Endocrine:  No thryomegaly Vascular: No carotid bruits; FA pulses 2+ bilaterally without bruits  Cardiac:  normal S1, S2; RRR; no murmur  Lungs:  clear to auscultation bilaterally, no wheezing, rhonchi or rales  Abd: soft, nontender, no hepatomegaly  Ext: no edema Musculoskeletal:  No deformities, BUE and BLE strength normal and equal Skin: warm and dry  Neuro:  CNs 2-12 intact, no focal abnormalities noted Psych:  Normal affect    EKG:  The ECG that was done September 27, 2018 was personally reviewed and demonstrates sinus rhythm with rare PACs and left atrial abnormality, early RS transition in lead V2  Relevant CV Studies: Echocardiogram September 28, 2018   - Left ventricle: The cavity size was normal. Wall thickness was   increased in a pattern of mild LVH. Systolic function was normal.   The estimated ejection fraction was in the range of 55% to 60%.   Wall motion was normal; there were no regional wall motion   abnormalities. Doppler parameters are consistent with abnormal   left ventricular relaxation (grade 1 diastolic dysfunction).   Doppler parameters are consistent with high ventricular  filling   pressure. - Aortic valve: Transvalvular velocity was within the normal range.   There was no stenosis. There was no regurgitation. - Mitral valve: Transvalvular velocity was within the normal range.   There was no evidence for stenosis. There was no regurgitation. - Right ventricle: The cavity size was normal. Wall thickness was   normal. Systolic function was normal. - Tricuspid valve: There was trivial regurgitation.  Impressions:  - Endocardial border definition is poor. Systolic function appears   grossly normal.  Laboratory Data:  Chemistry Recent Labs  Lab 02/17/19 1136  NA 145*  K 4.0  CL 105  CO2 23  GLUCOSE 87  BUN 11  CREATININE 0.90  CALCIUM 9.0  GFRNONAA 61  GFRAA 70    No results for input(s): PROT, ALBUMIN, AST, ALT, ALKPHOS, BILITOT in the last 168 hours. Hematology Recent Labs  Lab 02/17/19 1136  WBC 5.4  RBC 4.00  HGB 13.2  HCT 39.7  MCV 99*  MCH 33.0  MCHC 33.2  RDW 13.1  PLT 100*   Cardiac EnzymesNo results for input(s): TROPONINI in the last 168 hours. No results for input(s): TROPIPOC in the last 168 hours.  BNPNo results for input(s): BNP, PROBNP in the last 168 hours.  DDimer No results for input(s): DDIMER in the last 168 hours.  Radiology/Studies:  No results found.  Assessment and Plan:   1. Cryptogenic TIA  Plan transesophageal echocardiogram with moderate sedation today.  If no source of cardioembolic events is identified we will go ahead with implantable loop recorder.  This procedure has been fully reviewed with the patient and written informed consent has been obtained.    For questions or updates, please contact [place] Please consult [email or web address] for contact info under        Signed, Sanda Klein, MD  02/20/2019 11:50 AM

## 2019-02-21 ENCOUNTER — Encounter (HOSPITAL_COMMUNITY): Payer: Self-pay | Admitting: Cardiovascular Disease

## 2019-03-04 ENCOUNTER — Ambulatory Visit: Payer: Medicare Other

## 2019-03-07 ENCOUNTER — Telehealth: Payer: Self-pay | Admitting: *Deleted

## 2019-03-07 NOTE — Telephone Encounter (Signed)
Contacted patient to discuss LINQ wound check. Pt reports she wasn't aware of appointment on 03/04/19. Discussed wound site. Pt removed dressing, reports incision edges are fully approximated, denies drainage, redness, swelling, fever/chills. Advised pt that home monitor is transmitting automatically, encouraged to call us for any wound issues or questions. Pt verbalizes understanding and thanked me for my call.

## 2019-03-19 ENCOUNTER — Encounter: Payer: Self-pay | Admitting: General Practice

## 2019-03-19 NOTE — Progress Notes (Signed)
[place] CSW Progress Notes  Follow up call to patient, Adv Dir packet mailed previously.  Asked patient if she had questions and whether she would like to schedule appt to complete this process. Left my contact information.  Edwyna Shell, LCSW Clinical Social Worker Phone:  [phone]

## 2019-03-20 ENCOUNTER — Telehealth: Payer: Self-pay

## 2019-03-20 ENCOUNTER — Encounter: Payer: Self-pay | Admitting: General Practice

## 2019-03-20 NOTE — Telephone Encounter (Signed)
Pt called in with a question about the loop recorder that was put in. Pt is going out of town and would like to know if she needs to take the recorder with her or leave it at home? She would like a call back at [phone]. Please address. Thank you

## 2019-03-20 NOTE — Telephone Encounter (Signed)
The patient has been called and made aware.

## 2019-03-20 NOTE — Telephone Encounter (Signed)
She can leave the transmitter at home. I would only take it with her if she is planning to be gone for more than a couple of months. The information is stored in the memory of the actual implant.  The transmitter is only to allow the downloads, which we typically do monthly, but not a big deal if there is a delay.

## 2019-03-20 NOTE — Progress Notes (Signed)
[place] CSW Progress Notes  Return call from patient, states she is interested in completing Advance Directives however is not at [facility] for an appt in the near future.  CSW attempted to call her, but there was no answer and no VM.  Pls recontact CSW if interested in scheduling appt for these documents to be completed.  Edwyna Shell, LCSW Clinical Social Worker Phone:  [phone]

## 2019-03-25 ENCOUNTER — Ambulatory Visit (INDEPENDENT_AMBULATORY_CARE_PROVIDER_SITE_OTHER): Payer: Medicare Other | Admitting: *Deleted

## 2019-03-25 DIAGNOSIS — G459 Transient cerebral ischemic attack, unspecified: Secondary | ICD-10-CM

## 2019-03-25 LAB — CUP PACEART REMOTE DEVICE CHECK
Date Time Interrogation Session: 20200707164218
Implantable Pulse Generator Implant Date: 20200604

## 2019-04-04 ENCOUNTER — Telehealth: Payer: Medicare Other | Admitting: Cardiovascular Disease

## 2019-04-05 NOTE — Progress Notes (Signed)
Carelink Summary Report / Loop Recorder 

## 2019-04-15 DIAGNOSIS — M79675 Pain in left toe(s): Secondary | ICD-10-CM | POA: Diagnosis not present

## 2019-04-15 DIAGNOSIS — W1841XA Slipping, tripping and stumbling without falling due to stepping on object, initial encounter: Secondary | ICD-10-CM | POA: Diagnosis not present

## 2019-04-15 DIAGNOSIS — W269XXA Contact with unspecified sharp object(s), initial encounter: Secondary | ICD-10-CM | POA: Diagnosis not present

## 2019-04-15 DIAGNOSIS — S92912A Unspecified fracture of left toe(s), initial encounter for closed fracture: Secondary | ICD-10-CM | POA: Diagnosis not present

## 2019-04-15 DIAGNOSIS — S92512A Displaced fracture of proximal phalanx of left lesser toe(s), initial encounter for closed fracture: Secondary | ICD-10-CM | POA: Diagnosis not present

## 2019-04-28 ENCOUNTER — Ambulatory Visit (INDEPENDENT_AMBULATORY_CARE_PROVIDER_SITE_OTHER): Payer: Medicare Other | Admitting: *Deleted

## 2019-04-28 DIAGNOSIS — G459 Transient cerebral ischemic attack, unspecified: Secondary | ICD-10-CM

## 2019-04-28 LAB — CUP PACEART REMOTE DEVICE CHECK
Date Time Interrogation Session: 20200809181023
Implantable Pulse Generator Implant Date: 20200604

## 2019-05-06 NOTE — Progress Notes (Signed)
Carelink Summary Report / Loop Recorder 

## 2019-05-19 DIAGNOSIS — J45909 Unspecified asthma, uncomplicated: Secondary | ICD-10-CM | POA: Diagnosis not present

## 2019-05-19 DIAGNOSIS — J479 Bronchiectasis, uncomplicated: Secondary | ICD-10-CM | POA: Diagnosis not present

## 2019-05-19 DIAGNOSIS — Z853 Personal history of malignant neoplasm of breast: Secondary | ICD-10-CM | POA: Diagnosis not present

## 2019-05-19 DIAGNOSIS — I10 Essential (primary) hypertension: Secondary | ICD-10-CM | POA: Diagnosis not present

## 2019-05-28 ENCOUNTER — Other Ambulatory Visit: Payer: Self-pay | Admitting: *Deleted

## 2019-05-28 MED ORDER — TAMOXIFEN CITRATE 20 MG PO TABS: 20.0000 mg | ORAL_TABLET | Freq: Every day | ORAL | 3 refills | Status: DC

## 2019-05-29 DIAGNOSIS — Z853 Personal history of malignant neoplasm of breast: Secondary | ICD-10-CM | POA: Diagnosis not present

## 2019-05-29 DIAGNOSIS — I10 Essential (primary) hypertension: Secondary | ICD-10-CM | POA: Diagnosis not present

## 2019-05-29 DIAGNOSIS — J479 Bronchiectasis, uncomplicated: Secondary | ICD-10-CM | POA: Diagnosis not present

## 2019-05-29 DIAGNOSIS — J45909 Unspecified asthma, uncomplicated: Secondary | ICD-10-CM | POA: Diagnosis not present

## 2019-05-30 ENCOUNTER — Ambulatory Visit (INDEPENDENT_AMBULATORY_CARE_PROVIDER_SITE_OTHER): Payer: Medicare Other | Admitting: *Deleted

## 2019-05-30 DIAGNOSIS — G459 Transient cerebral ischemic attack, unspecified: Secondary | ICD-10-CM | POA: Diagnosis not present

## 2019-05-31 LAB — CUP PACEART REMOTE DEVICE CHECK
Date Time Interrogation Session: 20200911181200
Implantable Pulse Generator Implant Date: 20200604

## 2019-06-06 NOTE — Progress Notes (Signed)
Carelink Summary Report / Loop Recorder 

## 2019-06-11 ENCOUNTER — Ambulatory Visit (INDEPENDENT_AMBULATORY_CARE_PROVIDER_SITE_OTHER): Payer: Medicare Other | Admitting: Cardiovascular Disease

## 2019-06-11 ENCOUNTER — Encounter: Payer: Self-pay | Admitting: Cardiovascular Disease

## 2019-06-11 ENCOUNTER — Other Ambulatory Visit: Payer: Self-pay

## 2019-06-11 VITALS — BP 148/62 | HR 84 | Temp 98.2°F | Ht 66.0 in | Wt 187.4 lb

## 2019-06-11 DIAGNOSIS — G459 Transient cerebral ischemic attack, unspecified: Secondary | ICD-10-CM | POA: Diagnosis not present

## 2019-06-11 DIAGNOSIS — Z95818 Presence of other cardiac implants and grafts: Secondary | ICD-10-CM | POA: Diagnosis not present

## 2019-06-11 DIAGNOSIS — I700 Atherosclerosis of aorta: Secondary | ICD-10-CM | POA: Diagnosis not present

## 2019-06-11 NOTE — Progress Notes (Signed)
Cardiology Office Note:    Date:  06/11/2019   ID:  Monique Watson, DOB Dec 02, 1940, MRN [id number]  PCP:  [name], MD  Cardiologist:  Glenetta Hew, MD  Electrophysiologist:  None   Referring MD: [name], MD   Chief Complaint  Patient presents with   Transient Ischemic Attack    History of Present Illness:    Monique Watson is a 78 y.o. female with a hx of probable transient ischemic attack, aortic atherosclerosis, history of smoking (quit January 2020), who presents for follow-up after implantation of a loop recorder and transesophageal echocardiogram in June 2020.  Her transesophageal echo showed normal cardiac findings without any cardioembolic source.  She did have significant aortic atherosclerotic plaque but this was primarily located in the descending aorta and could not explain a TIA.  Her loop recorder has not shown any meaningful arrhythmia to date.  The patient specifically denies any chest pain at rest exertion, dyspnea at rest or with exertion, orthopnea, paroxysmal nocturnal dyspnea, syncope, palpitations, focal neurological deficits, intermittent claudication, lower extremity edema, unexplained weight gain, cough, hemoptysis or wheezing.  The loop recorder site has healed well and does not bother her at all.  Past Medical History:  Diagnosis Date   Anemia    Aortic atherosclerosis ([place])    CT chest   Arthritis    Breast cancer ([place]) 09/29/2011   s/p radiation   Hypertension    Ovarian cancer ([place]) 09/29/2011   s/p chemo, radiation   Pneumonia    Radiation 01/14/2009-02/10/2009   left breast 45 Gy, boosted to 5 Gy   Seasonal allergies    Vitamin D deficiency     Past Surgical History:  Procedure Laterality Date   ABDOMINAL HYSTERECTOMY  2006   APPENDECTOMY     BLADDER SURGERY     BREAST LUMPECTOMY     BREAST LUMPECTOMY WITH RADIOACTIVE SEED LOCALIZATION Right 04/07/2015   Procedure: BREAST LUMPECTOMY WITH RADIOACTIVE  SEED LOCALIZATION;  Surgeon: Erroll Luna, MD;  Location: [place];  Service: General;  Laterality: Right;   BUBBLE STUDY  02/20/2019   Procedure: BUBBLE STUDY;  Surgeon: Sanda Klein, MD;  Location: [place];  Service: Cardiovascular;;   LOOP RECORDER INSERTION N/A 02/20/2019   Procedure: LOOP RECORDER INSERTION;  Surgeon: Sanda Klein, MD;  Location: [place] [facility];  Service: Cardiovascular;  Laterality: N/A;   PORT-A-CATH REMOVAL N/A 07/13/2015   Procedure: REMOVAL PORT-A-CATH;  Surgeon: Erroll Luna, MD;  Location: [place];  Service: General;  Laterality: N/A;   TEE WITHOUT CARDIOVERSION N/A 02/20/2019   Procedure: TRANSESOPHAGEAL ECHOCARDIOGRAM (TEE) WITH IMPLANTABLE LOOP RECORDER;  Surgeon: Sanda Klein, MD;  Location: [place];  Service: Cardiovascular;  Laterality: N/A;   TONSILLECTOMY     TRANSTHORACIC ECHOCARDIOGRAM  09/2018   Poor quality study.  Mild LVH.  EF 55 to 60%.  No R WMA.  GR 1 DD.  Normal valves.    Current Medications: Current Meds  Medication Sig   albuterol (VENTOLIN HFA) 108 (90 Base) MCG/ACT inhaler Inhale 1-2 puffs into the lungs every 6 (six) hours as needed for wheezing or shortness of breath.   aspirin 81 MG tablet Take 81 mg by mouth daily.   CALCIUM PO Take 1 tablet by mouth daily.   Cholecalciferol (VITAMIN D3) 2000 UNITS TABS Take 2,000 Units by mouth daily.    Omega-3 Fatty Acids (FISH OIL CONCENTRATE) 1000 MG CAPS Take 1,000 mg by mouth daily.    potassium chloride (  K-DUR,KLOR-CON) 10 MEQ tablet Take 10 mEq by mouth daily.   Probiotic Product (PROBIOTIC PO) Take 1 capsule by mouth daily.   Respiratory Therapy Supplies (FLUTTER) DEVI Use as directed   tamoxifen (NOLVADEX) 20 MG tablet Take 1 tablet (20 mg total) by mouth daily.   vitamin E (VITAMIN E) 400 UNIT capsule Take 400 Units by mouth daily.      Allergies:   Chlorhexidine, Arimidex [anastrozole], Diovan [valsartan], and  Uncoded nonscreenable allergen   Social History   Socioeconomic History   Marital status: Married    Spouse name: Charles   Number of children: 2   Years of education: 16   Highest education level: Not on file  Occupational History    Comment: retired  Scientist, product/process development strain: Not on file   Food insecurity    Worry: Not on file    Inability: Not on Lexicographer needs    Medical: Not on file    Non-medical: Not on file  Tobacco Use   Smoking status: Former Smoker    Packs/day: 1.00    Years: 58.00    Pack years: 58.00    Types: Cigarettes    Quit date: 07/19/2017    Years since quitting: 1.8   Smokeless tobacco: Never Used  Substance and Sexual Activity   Alcohol use: Yes    Alcohol/week: 1.0 standard drinks    Types: 1 Shots of liquor per week    Comment: 11/29/15 cocktail on weekends   Drug use: No   Sexual activity: Yes    Birth control/protection: Post-menopausal  Lifestyle   Physical activity    Days per week: Not on file    Minutes per session: Not on file   Stress: Not on file  Relationships   Social connections    Talks on phone: Not on file    Gets together: Not on file    Attends religious service: Not on file    Active member of club or organization: Not on file    Attends meetings of clubs or organizations: Not on file    Relationship status: Not on file  Other Topics Concern   Not on file  Social History Narrative   Lives with husband   Caffeine use- coffee 2 cups daily, sometimes tea     Family History: The patient's family history includes Breast cancer in her paternal aunt; Cancer in her cousin; Colon cancer in her paternal aunt; Colon cancer (age of onset: 45) in her brother; Ovarian cancer (age of onset: 29) in her mother; Pancreatitis (age of onset: 29) in her brother.  ROS:   Please see the history of present illness.     All other systems reviewed and are negative.  EKGs/Labs/Other Studies  Reviewed:    The following studies were reviewed today: Comprehensive loop recorder download from just a few days ago  EKG:  EKG is  ordered today.  The ekg ordered today demonstrates normal sinus rhythm, incomplete right bundle branch block, no repolarization abnormalities, QTC 444 ms  Recent Labs: 09/27/2018: ALT 20 02/17/2019: BUN 11; Creatinine, Ser 0.90; Hemoglobin 13.2; Platelets 100; Potassium 4.0; Sodium 145  Recent Lipid Panel    Component Value Date/Time   CHOL 140 09/28/2018 0528   TRIG 80 09/28/2018 0528   HDL 62 09/28/2018 0528   CHOLHDL 2.3 09/28/2018 0528   VLDL 16 09/28/2018 0528   LDLCALC 62 09/28/2018 0528    Physical Exam:    VS:  BP (!) 148/62    Pulse 84    Temp 98.2 F (36.8 C) (Temporal)    Ht 5\' 6"  (1.676 m)    Wt 187 lb 6.4 oz (85 kg)    SpO2 92%    BMI 30.25 kg/m     Wt Readings from Last 3 Encounters:  06/11/19 187 lb 6.4 oz (85 kg)  02/20/19 188 lb (85.3 kg)  12/13/18 184 lb (83.5 kg)     GEN:  Well nourished, well developed in no acute distress HEENT: Normal NECK: No JVD; No carotid bruits LYMPHATICS: No lymphadenopathy CARDIAC: RRR, no murmurs, rubs, gallops RESPIRATORY:  Clear to auscultation without rales, wheezing or rhonchi  ABDOMEN: Soft, non-tender, non-distended MUSCULOSKELETAL:  No edema; No deformity  SKIN: Warm and dry NEUROLOGIC:  Alert and oriented x 3 PSYCHIATRIC:  Normal affect   ASSESSMENT:    1. TIA (transient ischemic attack)   2. Status post placement of implantable loop recorder   3. Atherosclerosis of aorta (HCC)    PLAN:    In order of problems listed above:  1. TIA: No recurrent neurological complaints. 2. ILR: Device site is well-healed and there is no evidence of arrhythmia to date.  Continue remotes monthly downloads. 3. Aortic atherosclerosis: This was primarily in the descending aorta and cannot explain her neurological events.  She had an excellent lipid profile in January of this year.  She has quit  smoking.  She does not have hypertension.  Should continue aspirin.   Medication Adjustments/Labs and Tests Ordered: Current medicines are reviewed at length with the patient today.  Concerns regarding medicines are outlined above.  No orders of the defined types were placed in this encounter.  No orders of the defined types were placed in this encounter.   Patient Instructions  Medication Instructions:  Your physician recommends that you continue on your current medications as directed. Please refer to the Current Medication list given to you today.  If you need a refill on your cardiac medications before your next appointment, please call your pharmacy.   Lab work: None ordered If you have labs (blood work) drawn today and your tests are completely normal, you will receive your results only by: [place] (if you have MyChart) OR A paper copy in the mail If you have any lab test that is abnormal or we need to change your treatment, we will call you to review the results.  Testing/Procedures: None ordered  Follow-Up: At [facility], you and your health needs are our priority.  As part of our continuing mission to provide you with exceptional heart care, we have created designated Provider Care Teams.  These Care Teams include your primary Cardiologist (physician) and Advanced Practice Providers (APPs -  Physician Assistants and Nurse Practitioners) who all work together to provide you with the care you need, when you need it. You will need a follow up appointment in 12 months.  Please call our office 2 months in advance to schedule this appointment.  You may see Sanda Klein, MD or one of the following Advanced Practice Providers on your designated Care Team: Almyra Deforest, PA-C Fabian Sharp, PA-C         Signed, Sanda Klein, MD  06/11/2019 10:40 AM    [place]

## 2019-06-11 NOTE — Patient Instructions (Signed)

## 2019-06-11 NOTE — Addendum Note (Signed)
Addended by: Diana Eves on: 06/11/2019 10:57 AM   Modules accepted: Orders

## 2019-07-02 ENCOUNTER — Ambulatory Visit (INDEPENDENT_AMBULATORY_CARE_PROVIDER_SITE_OTHER): Payer: Medicare Other | Admitting: *Deleted

## 2019-07-02 DIAGNOSIS — G459 Transient cerebral ischemic attack, unspecified: Secondary | ICD-10-CM | POA: Diagnosis not present

## 2019-07-02 LAB — CUP PACEART REMOTE DEVICE CHECK
Date Time Interrogation Session: 20201014181235
Implantable Pulse Generator Implant Date: 20200604

## 2019-07-08 DIAGNOSIS — Z853 Personal history of malignant neoplasm of breast: Secondary | ICD-10-CM | POA: Diagnosis not present

## 2019-07-08 DIAGNOSIS — I10 Essential (primary) hypertension: Secondary | ICD-10-CM | POA: Diagnosis not present

## 2019-07-08 DIAGNOSIS — J479 Bronchiectasis, uncomplicated: Secondary | ICD-10-CM | POA: Diagnosis not present

## 2019-07-08 DIAGNOSIS — J45909 Unspecified asthma, uncomplicated: Secondary | ICD-10-CM | POA: Diagnosis not present

## 2019-07-14 NOTE — Progress Notes (Signed)
Carelink Summary Report / Loop Recorder 

## 2019-07-18 DIAGNOSIS — Z23 Encounter for immunization: Secondary | ICD-10-CM | POA: Diagnosis not present

## 2019-07-23 DIAGNOSIS — Z23 Encounter for immunization: Secondary | ICD-10-CM | POA: Diagnosis not present

## 2019-08-04 ENCOUNTER — Ambulatory Visit (INDEPENDENT_AMBULATORY_CARE_PROVIDER_SITE_OTHER): Payer: Medicare Other | Admitting: *Deleted

## 2019-08-04 DIAGNOSIS — G459 Transient cerebral ischemic attack, unspecified: Secondary | ICD-10-CM

## 2019-08-05 LAB — CUP PACEART REMOTE DEVICE CHECK
Date Time Interrogation Session: 20201116192038
Implantable Pulse Generator Implant Date: 20200604

## 2019-08-06 DIAGNOSIS — Z853 Personal history of malignant neoplasm of breast: Secondary | ICD-10-CM | POA: Diagnosis not present

## 2019-08-06 DIAGNOSIS — I10 Essential (primary) hypertension: Secondary | ICD-10-CM | POA: Diagnosis not present

## 2019-08-06 DIAGNOSIS — J45909 Unspecified asthma, uncomplicated: Secondary | ICD-10-CM | POA: Diagnosis not present

## 2019-08-06 DIAGNOSIS — J479 Bronchiectasis, uncomplicated: Secondary | ICD-10-CM | POA: Diagnosis not present

## 2019-08-25 ENCOUNTER — Other Ambulatory Visit: Payer: Self-pay | Admitting: *Deleted

## 2019-08-25 ENCOUNTER — Telehealth: Payer: Self-pay | Admitting: *Deleted

## 2019-08-25 MED ORDER — TAMOXIFEN CITRATE 20 MG PO TABS: 20.0000 mg | ORAL_TABLET | Freq: Every day | ORAL | 0 refills | Status: DC

## 2019-08-25 NOTE — Telephone Encounter (Signed)
Received after hours message from pt regarding prescription refill.  Attempt x1 to contact pt, no answer, LVM.

## 2019-08-28 NOTE — Progress Notes (Signed)
Carelink Summary Report / Loop Recorder 

## 2019-10-02 DIAGNOSIS — Z853 Personal history of malignant neoplasm of breast: Secondary | ICD-10-CM | POA: Diagnosis not present

## 2019-10-02 DIAGNOSIS — I10 Essential (primary) hypertension: Secondary | ICD-10-CM | POA: Diagnosis not present

## 2019-10-02 DIAGNOSIS — J479 Bronchiectasis, uncomplicated: Secondary | ICD-10-CM | POA: Diagnosis not present

## 2019-10-13 ENCOUNTER — Ambulatory Visit (INDEPENDENT_AMBULATORY_CARE_PROVIDER_SITE_OTHER): Payer: Medicare Other | Admitting: *Deleted

## 2019-10-13 DIAGNOSIS — G459 Transient cerebral ischemic attack, unspecified: Secondary | ICD-10-CM | POA: Diagnosis not present

## 2019-10-13 LAB — CUP PACEART REMOTE DEVICE CHECK
Date Time Interrogation Session: 20210124235950
Implantable Pulse Generator Implant Date: 20200604

## 2019-10-24 ENCOUNTER — Encounter: Payer: Self-pay | Admitting: Cardiovascular Disease

## 2019-10-24 DIAGNOSIS — D696 Thrombocytopenia, unspecified: Secondary | ICD-10-CM | POA: Diagnosis not present

## 2019-10-24 DIAGNOSIS — E876 Hypokalemia: Secondary | ICD-10-CM | POA: Diagnosis not present

## 2019-10-24 DIAGNOSIS — Z136 Encounter for screening for cardiovascular disorders: Secondary | ICD-10-CM | POA: Diagnosis not present

## 2019-10-24 DIAGNOSIS — E559 Vitamin D deficiency, unspecified: Secondary | ICD-10-CM | POA: Diagnosis not present

## 2019-10-24 DIAGNOSIS — R739 Hyperglycemia, unspecified: Secondary | ICD-10-CM | POA: Diagnosis not present

## 2019-10-24 DIAGNOSIS — J302 Other seasonal allergic rhinitis: Secondary | ICD-10-CM | POA: Diagnosis not present

## 2019-10-24 DIAGNOSIS — Z853 Personal history of malignant neoplasm of breast: Secondary | ICD-10-CM | POA: Diagnosis not present

## 2019-10-24 DIAGNOSIS — Z862 Personal history of diseases of the blood and blood-forming organs and certain disorders involving the immune mechanism: Secondary | ICD-10-CM | POA: Diagnosis not present

## 2019-10-24 DIAGNOSIS — Z0000 Encounter for general adult medical examination without abnormal findings: Secondary | ICD-10-CM | POA: Diagnosis not present

## 2019-10-24 DIAGNOSIS — J479 Bronchiectasis, uncomplicated: Secondary | ICD-10-CM | POA: Diagnosis not present

## 2019-10-24 DIAGNOSIS — Z8543 Personal history of malignant neoplasm of ovary: Secondary | ICD-10-CM | POA: Diagnosis not present

## 2019-10-24 DIAGNOSIS — I700 Atherosclerosis of aorta: Secondary | ICD-10-CM | POA: Diagnosis not present

## 2019-10-24 DIAGNOSIS — Z87891 Personal history of nicotine dependence: Secondary | ICD-10-CM | POA: Diagnosis not present

## 2019-11-12 ENCOUNTER — Telehealth: Payer: Self-pay

## 2019-11-12 DIAGNOSIS — I10 Essential (primary) hypertension: Secondary | ICD-10-CM | POA: Diagnosis not present

## 2019-11-12 DIAGNOSIS — Z853 Personal history of malignant neoplasm of breast: Secondary | ICD-10-CM | POA: Diagnosis not present

## 2019-11-12 DIAGNOSIS — J479 Bronchiectasis, uncomplicated: Secondary | ICD-10-CM | POA: Diagnosis not present

## 2019-11-12 NOTE — Telephone Encounter (Signed)
Linq alert for ? AF episode.  Appears SR with ectopy lasting 4 mins.  Pt implanted due to history of stroke, no documented AF, on ASA81mg  not on Shedd.  Sending to MD for review/ recommendation if alerts can be adjusted.

## 2019-11-12 NOTE — Telephone Encounter (Signed)
Agree, this is due to PACs. Can we increase alert duration to 30 minutes? Or what thresholds are available?

## 2019-11-12 NOTE — Telephone Encounter (Signed)
We can adjust the alert notification to AF burden in 1 hour increments it will still record the episodes however we would possibly need pt to send manual transmissions for the EGMs.  Or we can have patient come in for reprogramming setting the detection in increments of 02/26/19/30 or 60 minute intervals.  We typically reprogram to 6 minute increments and make AF detection less sensitive.

## 2019-11-13 NOTE — Telephone Encounter (Signed)
Let's reprogram to 6 minutes and less sensitive AF detection as you suggested, please. Thanks!

## 2019-11-15 LAB — CUP PACEART REMOTE DEVICE CHECK
Date Time Interrogation Session: 20210227000500
Implantable Pulse Generator Implant Date: 20200604

## 2019-11-17 ENCOUNTER — Ambulatory Visit (INDEPENDENT_AMBULATORY_CARE_PROVIDER_SITE_OTHER): Payer: Medicare Other | Admitting: Internal Medicine

## 2019-11-17 ENCOUNTER — Other Ambulatory Visit: Payer: Self-pay

## 2019-11-17 ENCOUNTER — Encounter: Payer: Self-pay | Admitting: Internal Medicine

## 2019-11-17 ENCOUNTER — Ambulatory Visit (INDEPENDENT_AMBULATORY_CARE_PROVIDER_SITE_OTHER): Payer: Medicare Other | Admitting: *Deleted

## 2019-11-17 DIAGNOSIS — J479 Bronchiectasis, uncomplicated: Secondary | ICD-10-CM

## 2019-11-17 DIAGNOSIS — G459 Transient cerebral ischemic attack, unspecified: Secondary | ICD-10-CM | POA: Diagnosis not present

## 2019-11-17 LAB — CUP PACEART REMOTE DEVICE CHECK
Date Time Interrogation Session: 20210301003856
Implantable Pulse Generator Implant Date: 20200604

## 2019-11-17 NOTE — Patient Instructions (Signed)
No change in recommendations   Please schedule a follow up visit in 6  months but call sooner if needed with pfts and cxr

## 2019-11-17 NOTE — Progress Notes (Signed)
Subjective:     Patient ID: Monique Watson, female   DOB: 07/06/1941,    MRN: [id number]     Brief patient profile:  83 yowf quit smoking 11/ 2018 referred to pulmonary clinic 10/22/2017 by Dr   Monique Watson for eval of bronchiectasis with nl pfts 01/21/2018    History of Present Illness  10/22/2017 1st [facility] office visit/ Monique Watson   Chief Complaint  Patient presents with  . Pulmonary Consult    Referred by Dr. Leighton Watson. Pt states had PNA back in Nov 2018. She is coughing with yellow sputum. She uses her albuterol inhaler at least once per day on average.   baseline pna "every other year"  x 10-15 years and fine in between so fine in fact that she could ex at gym and no need for inhalers Has not returned  to gym p most recent dx of pna in Nov 2018  Uses saba each helps bring up  Maybe a tbsp beige more than yellow and never bloody / no better with a zpak Sleeps flat ok  Doe = MMRC1 = can walk nl pace, flat grade, can't hurry or go uphills or steps s sob   rec Bronchiectasis    Work on inhaler technique:   Use the flutter valve as much as you can to help you bring up mucus     01/21/2018  f/u ov/Rodarius Kichline re: bronchiectasis  Chief Complaint  Patient presents with  . Follow-up    Feeling much better, PFT today, Coughing yellow mucus, some  SOB    Dyspnea:  Not limited by breathing from desired activities   Cough: rare now  Sleep: fine flat SABA use:  Not using rec 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 of your bronchial tubes which means that they don't function perfectly normally and mucus tends to pool in certain areas of your lung which can cause pneumonia and further scarring of your lung and bronchial tubes Whenever you develop cough congestion take mucinex or mucinex dm > these will help keep the mucus loose and flowing but if your condition worsens you need to seek help immediately preferably here or somewhere inside the [facility] system to compare xrays ( worse = darker or bloody  mucus or pain on breathing in)   Work on inhaler technique:  relax and gently blow all the way out then take a nice smooth deep breath back in, triggering the inhaler at same time you start breathing in.  Hold for up to 5 seconds if you can.  Use the flutter valve as much as you can to help you bring up mucus  Be careful with oil based vitamins like fish oil as they present a problem with your bronchiectasis - use powders where you can and minimize the amount you use (instead of fish oil, eat more fish for example)      11/17/2019  f/u ov/Monique Watson re: bronchiectasis Chief Complaint  Patient presents with  . Follow-up    She states she gets tired easier over the past several months- relates to not exercising often enough.   Dyspnea:  Not limited by breathing from desired activities   Cough: none Sleeping: fine flat/ one pillow SABA use: none 02: none    No obvious day to day or daytime variability or assoc excess/ purulent sputum or mucus plugs or hemoptysis or cp or chest tightness, subjective wheeze or overt sinus or hb symptoms.   Sleeping fine without nocturnal  or early am  exacerbation  of respiratory  c/o's or need for noct saba. Also denies any obvious fluctuation of symptoms with weather or environmental changes or other aggravating or alleviating factors except as outlined above   No unusual exposure hx or h/o childhood pna/ asthma or knowledge of premature birth.  Current Allergies, Complete Past Medical History, Past Surgical History, Family History, and Social History were reviewed in [employer] record.  ROS  The following are not active complaints unless bolded Hoarseness, sore throat, dysphagia, dental problems, itching, sneezing,  nasal congestion or discharge of excess mucus or purulent secretions, ear ache,   fever, chills, sweats, unintended wt loss or wt gain, classically pleuritic or exertional cp,  orthopnea pnd or arm/hand swelling  or leg swelling,  presyncope, palpitations, abdominal pain, anorexia, nausea, vomiting, diarrhea  or change in bowel habits or change in bladder habits, change in stools or change in urine, dysuria, hematuria,  rash, arthralgias, visual complaints, headache, numbness, weakness or ataxia or problems with walking or coordination,  change in mood or  memory.        Current Meds  Medication Sig  . albuterol (VENTOLIN HFA) 108 (90 Base) MCG/ACT inhaler Inhale 1-2 puffs into the lungs every 6 (six) hours as needed for wheezing or shortness of breath.  Marland Kitchen aspirin 81 MG tablet Take 81 mg by mouth daily.  Marland Kitchen CALCIUM PO Take 1 tablet by mouth daily.  . Cholecalciferol (VITAMIN D3) 2000 UNITS TABS Take 2,000 Units by mouth daily.   . Omega-3 Fatty Acids (FISH OIL CONCENTRATE) 1000 MG CAPS Take 1,000 mg by mouth daily.   . potassium chloride (K-DUR,KLOR-CON) 10 MEQ tablet Take 10 mEq by mouth daily.  . Probiotic Product (PROBIOTIC PO) Take 1 capsule by mouth daily.  Marland Kitchen Respiratory Therapy Supplies (FLUTTER) DEVI Use as directed  . tamoxifen (NOLVADEX) 20 MG tablet Take 1 tablet (20 mg total) by mouth daily.  . vitamin E (VITAMIN E) 400 UNIT capsule Take 400 Units by mouth daily.                   Objective:   Physical Exam     amb bf nad   11/17/2019            01/21/2018         178   10/22/17 181 lb (82.1 kg)  03/06/17 169 lb (76.7 kg)  01/16/17 174 lb 6.4 oz (79.1 kg)    Vital signs reviewed  11/17/2019  - Note at rest 02 sats  100% on RA        Reports top full denture/ poor lower dentition  HEENT : pt wearing mask not removed for exam due to covid -19 concerns.    NECK :  without JVD/Nodes/TM/ nl carotid upstrokes bilaterally   LUNGS: no acc muscle use,  Nl contour chest min insp pops/squeaks  bilaterally without cough on insp or exp maneuvers   CV:  RRR  no s3 or murmur or increase in P2, and no edema   ABD:  soft and nontender with nl inspiratory excursion in the supine position. No bruits or  organomegaly appreciated, bowel sounds nl  MS:  Nl gait/ ext warm without deformities, calf tenderness, cyanosis or clubbing No obvious joint restrictions   SKIN: warm and dry without lesions    NEURO:  alert, approp, nl sensorium with  no motor or cerebellar deficits apparent.  Assessment:

## 2019-11-17 NOTE — Progress Notes (Signed)
ILR Remote 

## 2019-11-18 ENCOUNTER — Encounter: Payer: Self-pay | Admitting: Internal Medicine

## 2019-11-18 NOTE — Assessment & Plan Note (Signed)
See CT chest 08/17/17  - Spirometry 10/22/2017  wnl s am saba but active exp  rhonchi on exam  - Quant Ig's wnl  - Allergy profile 10/22/2017 >  Eos 0.1 /  IgE  45 RAST neg  - Quant TB 10/22/2017  Neg - Flutter valve 10/22/17  - PFT's  01/21/2018  FEV1 1.44 (90 % ) ratio 88  p no % improvement from saba p nothing prior to study with DLCO  60/62 % corrects to 89 % for alv volume    No evidence of clinical progression at this point though pt would like to repeat w/u - problematic in pandemic but should be good to schedule for pfts/ cxr in 6 m.  Pt informed of the seriousness of COVID 19 infection as a direct risk to lung health  and safey and to close contacts and should continue to wear a facemask in public and minimize exposure to public locations but especially avoid any area or activity where non-close contacts are not observing distancing or wearing an appropriate face mask.  I strongly recommended vaccine when offered.            Each maintenance medication was reviewed in detail including emphasizing most importantly the difference between maintenance and prns and under what circumstances the prns are to be triggered using an action plan format where appropriate.  Total time for H and P, chart review, counseling,  and generating customized AVS unique to this office visit / charting = 30min

## 2019-11-18 NOTE — Telephone Encounter (Signed)
Spoke with pt, she is agreeable to in-clinic visit for reprogramming.  Sending message to schedule to set up appt.  Per Dr. Sallyanne Kuster reprogram Linq to 6 minutes/ less sensitive AF detection.

## 2019-11-21 ENCOUNTER — Telehealth: Payer: Self-pay | Admitting: Emergency Medicine

## 2019-11-23 LAB — CUP PACEART REMOTE DEVICE CHECK
Date Time Interrogation Session: 20210307000500
Implantable Pulse Generator Implant Date: 20200604

## 2019-11-24 ENCOUNTER — Other Ambulatory Visit: Payer: Self-pay | Admitting: Hematology and Oncology

## 2019-11-25 ENCOUNTER — Ambulatory Visit (INDEPENDENT_AMBULATORY_CARE_PROVIDER_SITE_OTHER): Payer: Medicare Other | Admitting: *Deleted

## 2019-11-25 ENCOUNTER — Other Ambulatory Visit: Payer: Self-pay

## 2019-11-25 DIAGNOSIS — I639 Cerebral infarction, unspecified: Secondary | ICD-10-CM

## 2019-11-25 LAB — CUP PACEART INCLINIC DEVICE CHECK
Date Time Interrogation Session: 20210309114800
Implantable Pulse Generator Implant Date: 20200604

## 2019-11-25 NOTE — Progress Notes (Signed)
Loop check in clinic. Battery status: OK. R-waves 0.88 mV. 0 symptom episodes, 0 tachy episodes, 0 pause episodes, 0 brady episodes. 55 AF episodes that appear to be false, SR with PACs and occasional PVC. Per Dr Recardo Evangelist AF detection programmed to less sensitive and AF detection set to record episodes > 6 minutes in duration Monthly summary reports and ROV with with Dr Recardo Evangelist PRN.

## 2019-11-27 NOTE — Telephone Encounter (Signed)
Opened in error

## 2019-12-22 ENCOUNTER — Ambulatory Visit (INDEPENDENT_AMBULATORY_CARE_PROVIDER_SITE_OTHER): Payer: Medicare Other | Admitting: *Deleted

## 2019-12-22 DIAGNOSIS — I639 Cerebral infarction, unspecified: Secondary | ICD-10-CM | POA: Diagnosis not present

## 2019-12-23 LAB — CUP PACEART REMOTE DEVICE CHECK
Date Time Interrogation Session: 20210401014158
Implantable Pulse Generator Implant Date: 20200604

## 2020-01-14 ENCOUNTER — Telehealth: Payer: Self-pay

## 2020-01-14 NOTE — Telephone Encounter (Signed)
I let the pt know her monitor sends automatically and she do not have to send manually unless the nurse call to ask for one.

## 2020-01-16 ENCOUNTER — Other Ambulatory Visit: Payer: Self-pay

## 2020-01-16 DIAGNOSIS — C50311 Malignant neoplasm of lower-inner quadrant of right female breast: Secondary | ICD-10-CM

## 2020-01-16 DIAGNOSIS — Z170 Estrogen receptor positive status [ER+]: Secondary | ICD-10-CM

## 2020-01-19 ENCOUNTER — Inpatient Hospital Stay: Payer: Medicare Other | Admitting: Hematology and Oncology

## 2020-01-19 ENCOUNTER — Inpatient Hospital Stay: Payer: Medicare Other | Attending: Hematology and Oncology

## 2020-01-19 NOTE — Assessment & Plan Note (Deleted)
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 I recommended that she resume tamoxifen therapy and I sent a new prescription today.  1.BRCA 2 mutation:Patient needs annual mammograms and MRIs. Patient has not had an MRI even after repeated discussions. We have had multiple orders for breast MRIs but they have not been done.  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  Surveillance: 1.Bilateral breast MRI 02/20/2018: No evidence of malignancy. Patient refuses further MRIs. 2.m[facility]ogram at [facility] 3.Breast exam 01/19/2020: Benign  In spite of my recommendation to do breast MRI, she has not done these tests.  Hypertension:  Return to clinic in 1 year for follow-up

## 2020-01-22 LAB — CUP PACEART REMOTE DEVICE CHECK
Date Time Interrogation Session: 20210502014453
Implantable Pulse Generator Implant Date: 20200604

## 2020-01-26 ENCOUNTER — Ambulatory Visit (INDEPENDENT_AMBULATORY_CARE_PROVIDER_SITE_OTHER): Payer: Medicare Other | Admitting: *Deleted

## 2020-01-26 DIAGNOSIS — G459 Transient cerebral ischemic attack, unspecified: Secondary | ICD-10-CM

## 2020-01-26 NOTE — Progress Notes (Signed)
Carelink Summary Report / Loop Recorder 

## 2020-02-18 LAB — CUP PACEART REMOTE DEVICE CHECK
Date Time Interrogation Session: 20210602014719
Implantable Pulse Generator Implant Date: 20200604

## 2020-02-19 ENCOUNTER — Ambulatory Visit (INDEPENDENT_AMBULATORY_CARE_PROVIDER_SITE_OTHER): Payer: Medicare Other | Admitting: *Deleted

## 2020-02-19 DIAGNOSIS — G459 Transient cerebral ischemic attack, unspecified: Secondary | ICD-10-CM | POA: Diagnosis not present

## 2020-02-23 ENCOUNTER — Telehealth: Payer: Self-pay | Admitting: Hematology and Oncology

## 2020-02-23 ENCOUNTER — Other Ambulatory Visit: Payer: Self-pay | Admitting: Hematology and Oncology

## 2020-02-23 NOTE — Telephone Encounter (Signed)
Scheduled appt per 6/7 sch message - unable to reach pt .left message with appt date and time   

## 2020-02-24 NOTE — Progress Notes (Signed)
Carelink Summary Report / Loop Recorder 

## 2020-02-28 DIAGNOSIS — Z23 Encounter for immunization: Secondary | ICD-10-CM | POA: Diagnosis not present

## 2020-03-23 ENCOUNTER — Ambulatory Visit (INDEPENDENT_AMBULATORY_CARE_PROVIDER_SITE_OTHER): Payer: Medicare Other | Admitting: *Deleted

## 2020-03-23 DIAGNOSIS — G459 Transient cerebral ischemic attack, unspecified: Secondary | ICD-10-CM | POA: Diagnosis not present

## 2020-03-23 LAB — CUP PACEART REMOTE DEVICE CHECK
Date Time Interrogation Session: 20210706015434
Implantable Pulse Generator Implant Date: 20200604

## 2020-03-24 NOTE — Progress Notes (Signed)
Carelink Summary Report / Loop Recorder 

## 2020-03-24 NOTE — Progress Notes (Signed)
Patient Care Team: [name], MD as PCP - General (Family Medicine) Leonie Man, MD as PCP - Cardiology (Cardiology) Erroll Luna, MD as Consulting Physician (General Surgery) Kyung Rudd, MD as Consulting Physician (Radiation Oncology) Gordy Levan, MD as Consulting Physician (Oncology) Sylvan Cheese, NP as Nurse Practitioner (Hematology and Oncology)  DIAGNOSIS:    ICD-10-CM   1. Malignant neoplasm of lower-inner quadrant of right breast of female, estrogen receptor positive ([place])  C50.311    Z17.0     SUMMARY OF ONCOLOGIC HISTORY: Oncology History Overview Note  3/3/Multifocal T1N0 left breast at lumpectomy and sentinel node evaluation March 2010. ER PR +, Her2 negative. 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 Intolerant to Femara and Aromasin, Tamoxifen   08/31/2014 Procedure   Genetic testing: Mutation at [facility], [id number]   01/26/2015 Breast MRI   There is a 5 x 9 x 8 mm mass enhancement at the slight lower slight medial right breast anterior to middle depth with plateau enhancement kinetics.   02/09/2015 Initial Biopsy   Right breast core needle bx: negative for malignancy    02/24/2015 Procedure   Right breast core needle bx: DCIS with comedonecrosis, ER+ (95%), PR- (0%)   02/24/2015 Clinical Stage   Stage 0: Tis N0   04/07/2015 Definitive Surgery   Right lumpectomy: DCIS, intermediate grade, with necrosis; close lateral margin   04/07/2015 Pathologic Stage   Stage 0: pTis pNx     05/17/2015 - 06/14/2015 Radiation Therapy   Adjuvant RT([place]): Right breast 42.5 Gy over 17 fractions; right breast boost 7.5 Gy over 3 fractions. Total dose: 50 Gy   09/02/2015 Survivorship   Survivorship care plan completed and mailed to patient in lieu of in person visit.     CHIEF COMPLIANT: Follow-up of recurrent breast cancer on tamoxifen  INTERVAL HISTORY: Monique Watson is a 79 y.o. 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 She presents to the clinic today for annual follow-up.   She is tolerating tamoxifen extremely well without any problems or concerns.  Denies any pain or lumps or nodules in the breast.  She has a BRCA mutation but has not had a breast MRI done.   ALLERGIES:  is allergic to chlorhexidine, arimidex [anastrozole], diovan [valsartan], and uncoded nonscreenable allergen.  MEDICATIONS:  Current Outpatient Medications  Medication Sig Dispense Refill  . albuterol (VENTOLIN HFA) 108 (90 Base) MCG/ACT inhaler Inhale 1-2 puffs into the lungs every 6 (six) hours as needed for wheezing or shortness of breath.    Marland Kitchen aspirin 81 MG tablet Take 81 mg by mouth daily.    Marland Kitchen CALCIUM PO Take 1 tablet by mouth daily.    . Cholecalciferol (VITAMIN D3) 2000 UNITS TABS Take 2,000 Units by mouth daily.     . Omega-3 Fatty Acids (FISH OIL CONCENTRATE) 1000 MG CAPS Take 1,000 mg by mouth daily.     . potassium chloride (K-DUR,KLOR-CON) 10 MEQ tablet Take 10 mEq by mouth daily.    Marland Kitchen  Probiotic Product (PROBIOTIC PO) Take 1 capsule by mouth daily.    Marland Kitchen Respiratory Therapy Supplies (FLUTTER) DEVI Use as directed 1 each 0  . tamoxifen (NOLVADEX) 20 MG tablet TAKE 1 TABLET BY MOUTH EVERY DAY 90 tablet 0  . vitamin E (VITAMIN E) 400 UNIT capsule Take 400 Units by mouth daily.      No current facility-administered medications for this visit.    PHYSICAL EXAMINATION: ECOG PERFORMANCE STATUS: 1 -  Symptomatic but completely ambulatory  Vitals:   03/25/20 1435  BP: (!) 141/63  Pulse: 72  Resp: 17  Temp: 98.9 F (37.2 C)  SpO2: 100%   Filed Weights   03/25/20 1435  Weight: 182 lb 14.4 oz (83 kg)    BREAST: No palpable masses or nodules in either right or left breasts. No palpable axillary supraclavicular or infraclavicular adenopathy no breast tenderness or nipple discharge. (exam performed in the presence of a chaperone)  LABORATORY DATA:  I have reviewed the data as listed CMP Latest Ref Rng & Units 02/17/2019 09/27/2018 09/27/2018  Glucose 65 - 99 mg/dL 87 - 141(H)  BUN 8 - 27 mg/dL 11 - 12  Creatinine 0.57 - 1.00 mg/dL 0.90 0.88 0.70  Sodium 134 - 144 mmol/L 145(H) - 142  Potassium 3.5 - 5.2 mmol/L 4.0 - 3.1(L)  Chloride 96 - 106 mmol/L 105 - 109  CO2 20 - 29 mmol/L 23 - -  Calcium 8.7 - 10.3 mg/dL 9.0 - -  Total Protein 6.5 - 8.1 g/dL - - -  Total Bilirubin 0.3 - 1.2 mg/dL - - -  Alkaline Phos 38 - 126 U/L - - -  AST 15 - 41 U/L - - -  ALT 0 - 44 U/L - - -    Lab Results  Component Value Date   WBC 5.4 02/17/2019   HGB 13.2 02/17/2019   HCT 39.7 02/17/2019   MCV 99 (H) 02/17/2019   PLT 100 (LL) 02/17/2019   NEUTROABS 3.6 09/27/2018    ASSESSMENT & PLAN:  Breast cancer of lower-inner quadrant of right female breast 03/2015 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 BRCA 2 positive 11/2008: Hx left breast mutlifocal T1N0 ER PR + and HER 2 negative  Current treatment: Tamoxifen  Tolerating tamoxifen extremely well without any problems or concerns.  Breast cancer surveillance: 1.  Patient did not get annual breast MRI.  Last breast MRI was 02/20/2018.  I ordered a breast MRI to be done in January 2022. 2. [facility] at [facility]: Patient will schedule another mammogram.  She says that she will stop by their office to make that appointment. 3.  Breast exam 03/25/2020: Benign Return to clinic in 1 year for follow-up   No  orders of the defined types were placed in this encounter.  The patient has a good understanding of the overall plan. she agrees with it. she will call with any problems that may develop before the next visit here.  Total time spent: 20 mins including face to face time and time spent for planning, charting and coordination of care  Nicholas Lose, MD 03/25/2020  I, Cloyde Reams Dorshimer, am acting as scribe for Dr. Nicholas Lose.  I have reviewed the above documentation for accuracy and completeness, and I agree with the above.

## 2020-03-25 ENCOUNTER — Other Ambulatory Visit: Payer: Self-pay

## 2020-03-25 ENCOUNTER — Telehealth: Payer: Self-pay | Admitting: Hematology and Oncology

## 2020-03-25 ENCOUNTER — Inpatient Hospital Stay: Payer: Medicare Other | Attending: Hematology and Oncology | Admitting: Hematology and Oncology

## 2020-03-25 VITALS — BP 141/63 | HR 72 | Temp 98.9°F | Resp 17 | Ht 66.0 in | Wt 182.9 lb

## 2020-03-25 DIAGNOSIS — Z79899 Other long term (current) drug therapy: Secondary | ICD-10-CM | POA: Diagnosis not present

## 2020-03-25 DIAGNOSIS — Z1501 Genetic susceptibility to malignant neoplasm of breast: Secondary | ICD-10-CM

## 2020-03-25 DIAGNOSIS — Z7982 Long term (current) use of aspirin: Secondary | ICD-10-CM | POA: Diagnosis not present

## 2020-03-25 DIAGNOSIS — I639 Cerebral infarction, unspecified: Secondary | ICD-10-CM | POA: Diagnosis not present

## 2020-03-25 DIAGNOSIS — Z170 Estrogen receptor positive status [ER+]: Secondary | ICD-10-CM | POA: Diagnosis not present

## 2020-03-25 DIAGNOSIS — Z1371 Encounter for nonprocreative screening for genetic disease carrier status: Secondary | ICD-10-CM

## 2020-03-25 DIAGNOSIS — Z923 Personal history of irradiation: Secondary | ICD-10-CM | POA: Diagnosis not present

## 2020-03-25 DIAGNOSIS — C50311 Malignant neoplasm of lower-inner quadrant of right female breast: Secondary | ICD-10-CM | POA: Insufficient documentation

## 2020-03-25 DIAGNOSIS — Z1509 Genetic susceptibility to other malignant neoplasm: Secondary | ICD-10-CM | POA: Diagnosis not present

## 2020-03-25 DIAGNOSIS — Z79810 Long term (current) use of selective estrogen receptor modulators (SERMs): Secondary | ICD-10-CM | POA: Diagnosis not present

## 2020-03-25 MED ORDER — TAMOXIFEN CITRATE 20 MG PO TABS: 20.0000 mg | ORAL_TABLET | Freq: Every day | ORAL | 3 refills | Status: DC

## 2020-03-25 NOTE — Assessment & Plan Note (Signed)
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  Patient did not get annual breast MRI.  Last breast MRI was 02/20/2018 2. mammogram at [facility] 3.  Breast exam 03/25/2020: Benign Return to clinic in 1 year for follow-up

## 2020-03-25 NOTE — Telephone Encounter (Signed)
Scheduled appts per 7/8 los. Gave pt a print out of AVS.  °

## 2020-04-16 ENCOUNTER — Other Ambulatory Visit: Payer: Self-pay | Admitting: Hematology and Oncology

## 2020-04-24 LAB — CUP PACEART REMOTE DEVICE CHECK
Date Time Interrogation Session: 20210806015341
Implantable Pulse Generator Implant Date: 20200604

## 2020-04-26 ENCOUNTER — Ambulatory Visit (INDEPENDENT_AMBULATORY_CARE_PROVIDER_SITE_OTHER): Payer: Medicare Other | Admitting: *Deleted

## 2020-04-26 DIAGNOSIS — G459 Transient cerebral ischemic attack, unspecified: Secondary | ICD-10-CM | POA: Diagnosis not present

## 2020-04-26 NOTE — Progress Notes (Signed)
Carelink Summary Report / Loop Recorder 

## 2020-04-29 DIAGNOSIS — I10 Essential (primary) hypertension: Secondary | ICD-10-CM | POA: Diagnosis not present

## 2020-04-29 DIAGNOSIS — J479 Bronchiectasis, uncomplicated: Secondary | ICD-10-CM | POA: Diagnosis not present

## 2020-04-29 DIAGNOSIS — Z853 Personal history of malignant neoplasm of breast: Secondary | ICD-10-CM | POA: Diagnosis not present

## 2020-05-06 ENCOUNTER — Encounter: Payer: Self-pay | Admitting: Hematology and Oncology

## 2020-05-06 DIAGNOSIS — R928 Other abnormal and inconclusive findings on diagnostic imaging of breast: Secondary | ICD-10-CM | POA: Diagnosis not present

## 2020-05-06 DIAGNOSIS — Z853 Personal history of malignant neoplasm of breast: Secondary | ICD-10-CM | POA: Diagnosis not present

## 2020-05-17 ENCOUNTER — Other Ambulatory Visit (HOSPITAL_COMMUNITY)
Admission: RE | Admit: 2020-05-17 | Discharge: 2020-05-17 | Disposition: A | Discharge status: Home / Self Care | Payer: Medicare Other | Source: Ambulatory Visit | Attending: Internal Medicine | Admitting: Internal Medicine

## 2020-05-17 DIAGNOSIS — Z01812 Encounter for preprocedural laboratory examination: Secondary | ICD-10-CM | POA: Insufficient documentation

## 2020-05-17 DIAGNOSIS — Z20822 Contact with and (suspected) exposure to covid-19: Secondary | ICD-10-CM | POA: Diagnosis not present

## 2020-05-17 LAB — SARS CORONAVIRUS 2 (TAT 6-24 HRS): SARS Coronavirus 2: NEGATIVE

## 2020-05-20 ENCOUNTER — Ambulatory Visit (INDEPENDENT_AMBULATORY_CARE_PROVIDER_SITE_OTHER): Payer: Medicare Other

## 2020-05-20 ENCOUNTER — Encounter: Payer: Self-pay | Admitting: Internal Medicine

## 2020-05-20 ENCOUNTER — Other Ambulatory Visit: Payer: Self-pay

## 2020-05-20 ENCOUNTER — Ambulatory Visit (INDEPENDENT_AMBULATORY_CARE_PROVIDER_SITE_OTHER): Payer: Medicare Other | Admitting: Internal Medicine

## 2020-05-20 DIAGNOSIS — J479 Bronchiectasis, uncomplicated: Secondary | ICD-10-CM

## 2020-05-20 DIAGNOSIS — J90 Pleural effusion, not elsewhere classified: Secondary | ICD-10-CM | POA: Diagnosis not present

## 2020-05-20 DIAGNOSIS — I639 Cerebral infarction, unspecified: Secondary | ICD-10-CM | POA: Diagnosis not present

## 2020-05-20 LAB — PULMONARY FUNCTION TEST
DL/VA % pred: 141 %
DL/VA: 5.74 ml/min/mmHg/L
DLCO cor % pred: 94 %
DLCO cor: 17.64 ml/min/mmHg
DLCO unc % pred: 94 %
DLCO unc: 17.64 ml/min/mmHg
FEF 25-75 Post: 1.36 L/sec
FEF 25-75 Pre: 1.21 L/sec
FEF2575-%Change-Post: 12 %
FEF2575-%Pred-Post: 106 %
FEF2575-%Pred-Pre: 94 %
FEV1-%Change-Post: 1 %
FEV1-%Pred-Post: 88 %
FEV1-%Pred-Pre: 86 %
FEV1-Post: 1.35 L
FEV1-Pre: 1.33 L
FEV1FVC-%Change-Post: 6 %
FEV1FVC-%Pred-Pre: 104 %
FEV6-%Change-Post: -4 %
FEV6-%Pred-Post: 84 %
FEV6-%Pred-Pre: 88 %
FEV6-Post: 1.61 L
FEV6-Pre: 1.68 L
FEV6FVC-%Pred-Post: 104 %
FEV6FVC-%Pred-Pre: 104 %
FVC-%Change-Post: -4 %
FVC-%Pred-Post: 81 %
FVC-%Pred-Pre: 84 %
FVC-Post: 1.61 L
FVC-Pre: 1.68 L
Post FEV1/FVC ratio: 84 %
Post FEV6/FVC ratio: 100 %
Pre FEV1/FVC ratio: 79 %
Pre FEV6/FVC Ratio: 100 %
RV % pred: 86 %
RV: 2.09 L
TLC % pred: 75 %
TLC: 3.79 L

## 2020-05-20 MED ORDER — AZITHROMYCIN 250 MG PO TABS: ORAL_TABLET | ORAL | 0 refills | Status: DC

## 2020-05-20 NOTE — Patient Instructions (Addendum)
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 of your bronchial tubes which means that they don't function perfectly normally and mucus tends to pool in certain areas of your lung which can cause pneumonia and further scarring of your lung and bronchial tubes  Whenever you develop cough congestion take mucinex or mucinex dm > these will help keep the mucus loose and flowing but if your condition worsens you need to seek help immediately preferably here or somewhere inside the [facility] system to compare xrays ( worse = darker or bloody mucus or pain on breathing in)    If the mucus gets nasty >  zpak   Please remember to go to the  x-ray department  for your tests - we will call you with the results when they are available    Please schedule a follow up visit in 12 months but call sooner if needed

## 2020-05-20 NOTE — Progress Notes (Signed)
Subjective:     Patient ID: Monique Watson, female   DOB: 06-Jul-1941,    MRN: [id number]     Brief patient profile:  2 yowf quit smoking 11/ 2018 referred to pulmonary clinic 10/22/2017 by Dr   Drema Dallas for eval of bronchiectasis with nl pfts 01/21/2018    History of Present Illness  10/22/2017 1st [facility] office visit/ Monique Watson   Chief Complaint  Patient presents with  . Pulmonary Consult    Referred by Dr. [name]. Pt states had PNA back in Nov 2018. She is coughing with yellow sputum. She uses her albuterol inhaler at least once per day on average.   baseline pna "every other year"  x 10-15 years and fine in between so fine in fact that she could ex at gym and no need for inhalers Has not returned  to gym p most recent dx of pna in Nov 2018  Uses saba each helps bring up  Maybe a tbsp beige more than yellow and never bloody / no better with a zpak Sleeps flat ok  Doe = MMRC1 = can walk nl pace, flat grade, can't hurry or go uphills or steps s sob   rec Bronchiectasis    Work on inhaler technique:   Use the flutter valve as much as you can to help you bring up mucus     01/21/2018  f/u ov/Monique Watson re: bronchiectasis  Chief Complaint  Patient presents with  . Follow-up    Feeling much better, PFT today, Coughing yellow mucus, some  SOB    Dyspnea:  Not limited by breathing from desired activities   Cough: rare now  Sleep: fine flat SABA use:  Not using rec 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 of your bronchial tubes which means that they don't function perfectly normally and mucus tends to pool in certain areas of your lung which can cause pneumonia and further scarring of your lung and bronchial tubes Whenever you develop cough congestion take mucinex or mucinex dm > these will help keep the mucus loose and flowing but if your condition worsens you need to seek help immediately preferably here or somewhere inside the [facility] system to compare xrays ( worse = darker or bloody  mucus or pain on breathing in)   Work on inhaler technique:  relax and gently blow all the way out then take a nice smooth deep breath back in, triggering the inhaler at same time you start breathing in.  Hold for up to 5 seconds if you can.  Use the flutter valve as much as you can to help you bring up mucus  Be careful with oil based vitamins like fish oil as they present a problem with your bronchiectasis - use powders where you can and minimize the amount you use (instead of fish oil, eat more fish for example)      11/17/2019  f/u ov/Monique Watson re: bronchiectasis Chief Complaint  Patient presents with  . Follow-up    She states she gets tired easier over the past several months- relates to not exercising often enough.   Dyspnea:  Not limited by breathing from desired activities   Cough: none Sleeping: fine flat/ one pillow SABA use: none 02: none  rec 6  m  with pfts and cxr    05/20/2020  f/u ov/Monique Watson re: bronchiectasis  Chief Complaint  Patient presents with  . Follow-up    PFT done today. Breathing is overall doing well. Her cough is about  the same- prod with yellow sputum.   Dyspnea:  Not limited by breathing from desired activities   Cough: not really a bother at present   Sleeping: ok bed is flat, one pillow  SABA use: not in a year  02: none    No obvious day to day or daytime variability or assoc excess/ purulent sputum or mucus plugs or hemoptysis or cp or chest tightness, subjective wheeze or overt sinus or hb symptoms.   Sleeping  without nocturnal  or early am exacerbation  of respiratory  c/o's or need for noct saba. Also denies any obvious fluctuation of symptoms with weather or environmental changes or other aggravating or alleviating factors except as outlined above   No unusual exposure hx or h/o childhood pna/ asthma or knowledge of premature birth.  Current Allergies, Complete Past Medical History, Past Surgical History, Family History, and Social History were  reviewed in [employer] record.  ROS  The following are not active complaints unless bolded Hoarseness, sore throat, dysphagia, dental problems, itching, sneezing,  nasal congestion or discharge of excess mucus or purulent secretions, ear ache,   fever, chills, sweats, unintended wt loss or wt gain, classically pleuritic or exertional cp,  orthopnea pnd or arm/hand swelling  or leg swelling, presyncope, palpitations, abdominal pain, anorexia, nausea, vomiting, diarrhea  or change in bowel habits or change in bladder habits, change in stools or change in urine, dysuria, hematuria,  rash, arthralgias, visual complaints, headache, numbness, weakness or ataxia or problems with walking or coordination,  change in mood or  memory.        Current Meds  Medication Sig  . albuterol (VENTOLIN HFA) 108 (90 Base) MCG/ACT inhaler Inhale 1-2 puffs into the lungs every 6 (six) hours as needed for wheezing or shortness of breath.  Marland Kitchen aspirin 81 MG tablet Take 81 mg by mouth daily.  Marland Kitchen CALCIUM PO Take 1 tablet by mouth daily.  . Cholecalciferol (VITAMIN D3) 2000 UNITS TABS Take 2,000 Units by mouth daily.   . Omega-3 Fatty Acids (FISH OIL CONCENTRATE) 1000 MG CAPS Take 1,000 mg by mouth daily.   . potassium chloride (K-DUR,KLOR-CON) 10 MEQ tablet Take 10 mEq by mouth daily.  . Probiotic Product (PROBIOTIC PO) Take 1 capsule by mouth daily.  Marland Kitchen Respiratory Therapy Supplies (FLUTTER) DEVI Use as directed  . tamoxifen (NOLVADEX) 20 MG tablet TAKE 1 TABLET BY MOUTH EVERY DAY  . vitamin E (VITAMIN E) 400 UNIT capsule Take 400 Units by mouth daily.                        Objective:   Physical Exam     amb pleasatn bf   Vital signs reviewed  05/20/2020  - Note at rest 02 sats  98% on RA   05/20/2020          180   01/21/2018         178   10/22/17 181 lb (82.1 kg)  03/06/17 169 lb (76.7 kg)  01/16/17 174 lb 6.4 oz (79.1 kg)        Reports top full denture      min insp  pops/squeaks  bilaterally     HEENT : pt wearing mask not removed for exam due to covid -19 concerns.    NECK :  without JVD/Nodes/TM/ nl carotid upstrokes bilaterally   LUNGS: no acc muscle use,  Nl contour chest with min pops/squeaks post  bilaterally without  cough on insp or exp maneuvers   CV:  RRR  no s3 or murmur or increase in P2, and no edema   ABD:  soft and nontender with nl inspiratory excursion in the supine position. No bruits or organomegaly appreciated, bowel sounds nl  MS:  Nl gait/ ext warm without deformities, calf tenderness, cyanosis or clubbing No obvious joint restrictions   SKIN: warm and dry without lesions    NEURO:  alert, approp, nl sensorium with  no motor or cerebellar deficits apparent.          CXR PA and Lateral:   05/20/2020 :    I personally reviewed images and agree with radiology impression as follows:   1. Increasing peribronchial thickening in keeping with increasing airway inflammation. 2. Bronchiectatic changes again noted, more prevalent within the lower lung zones bilaterally. My impression: no overall serial  change              Assessment:

## 2020-05-20 NOTE — Progress Notes (Signed)
Full PFT performed today. °

## 2020-05-21 ENCOUNTER — Encounter: Payer: Self-pay | Admitting: Internal Medicine

## 2020-05-21 NOTE — Assessment & Plan Note (Signed)
See CT chest 08/17/17  - Spirometry 10/22/2017  wnl s am saba but active exp  rhonchi on exam  - Quant Ig's wnl  - Allergy profile 10/22/2017 >  Eos 0.1 /  IgE  45 RAST neg  - Quant TB 10/22/2017  Neg - Flutter valve 10/22/17  - PFT's  01/21/2018  FEV1 1.44 (90 % ) ratio 88  p no % improvement from saba p nothing prior to study with DLCO  60/62 % corrects to 89 % for alv volume   - PFT's  05/20/2020  FEV1 1.33 (86 % ) ratio 0.79  p 1 % improvement from saba p 0 prior to study with DLCO  18.73 (94%) corrects to 4.10 (139%)  for alv volume and FV curve min/mild concavity    Reviewed with pt natural hx/ complications of bronchiectasis with min airflow obst > rx prn zpak for now and f/u yearly  Discussed in detail all the  indications, usual  risks and alternatives  relative to the benefits with patient who agrees to proceed with conservative f/u as outlined           Each maintenance medication was reviewed in detail including emphasizing most importantly the difference between maintenance and prns and under what circumstances the prns are to be triggered using an action plan format where appropriate.  Total time for H and P, chart review, counseling, teaching device and generating customized AVS unique to this office visit / charting = 20 min

## 2020-05-21 NOTE — Progress Notes (Signed)
Spoke with pt and notified of results per Dr. Wert. Pt verbalized understanding and denied any questions. 

## 2020-05-26 LAB — CUP PACEART REMOTE DEVICE CHECK
Date Time Interrogation Session: 20210906015514
Implantable Pulse Generator Implant Date: 20200604

## 2020-05-31 ENCOUNTER — Ambulatory Visit (INDEPENDENT_AMBULATORY_CARE_PROVIDER_SITE_OTHER): Payer: Medicare Other | Admitting: *Deleted

## 2020-05-31 DIAGNOSIS — G459 Transient cerebral ischemic attack, unspecified: Secondary | ICD-10-CM

## 2020-06-02 NOTE — Progress Notes (Signed)
Carelink Summary Report / Loop Recorder 

## 2020-06-17 DIAGNOSIS — Z853 Personal history of malignant neoplasm of breast: Secondary | ICD-10-CM | POA: Diagnosis not present

## 2020-06-17 DIAGNOSIS — J479 Bronchiectasis, uncomplicated: Secondary | ICD-10-CM | POA: Diagnosis not present

## 2020-06-17 DIAGNOSIS — I10 Essential (primary) hypertension: Secondary | ICD-10-CM | POA: Diagnosis not present

## 2020-06-25 LAB — CUP PACEART REMOTE DEVICE CHECK
Date Time Interrogation Session: 20211007015921
Implantable Pulse Generator Implant Date: 20200604

## 2020-06-26 DIAGNOSIS — Z23 Encounter for immunization: Secondary | ICD-10-CM | POA: Diagnosis not present

## 2020-07-05 ENCOUNTER — Ambulatory Visit (INDEPENDENT_AMBULATORY_CARE_PROVIDER_SITE_OTHER): Payer: Medicare Other

## 2020-07-05 DIAGNOSIS — G459 Transient cerebral ischemic attack, unspecified: Secondary | ICD-10-CM | POA: Diagnosis not present

## 2020-07-06 DIAGNOSIS — Z23 Encounter for immunization: Secondary | ICD-10-CM | POA: Diagnosis not present

## 2020-07-08 NOTE — Progress Notes (Signed)
Carelink Summary Report / Loop Recorder 

## 2020-08-09 ENCOUNTER — Ambulatory Visit (INDEPENDENT_AMBULATORY_CARE_PROVIDER_SITE_OTHER): Payer: Medicare Other

## 2020-08-09 DIAGNOSIS — I639 Cerebral infarction, unspecified: Secondary | ICD-10-CM | POA: Diagnosis not present

## 2020-08-09 LAB — CUP PACEART REMOTE DEVICE CHECK
Date Time Interrogation Session: 20211121231936
Implantable Pulse Generator Implant Date: 20200604

## 2020-08-10 NOTE — Progress Notes (Signed)
Carelink Summary Report / Loop Recorder 

## 2020-09-13 ENCOUNTER — Ambulatory Visit (INDEPENDENT_AMBULATORY_CARE_PROVIDER_SITE_OTHER): Payer: Medicare Other

## 2020-09-13 DIAGNOSIS — I639 Cerebral infarction, unspecified: Secondary | ICD-10-CM

## 2020-09-13 LAB — CUP PACEART REMOTE DEVICE CHECK
Date Time Interrogation Session: 20211222235414
Implantable Pulse Generator Implant Date: 20200604

## 2020-09-27 NOTE — Progress Notes (Signed)
Carelink Summary Report / Loop Recorder 

## 2020-10-07 DIAGNOSIS — I10 Essential (primary) hypertension: Secondary | ICD-10-CM | POA: Diagnosis not present

## 2020-10-07 DIAGNOSIS — Z853 Personal history of malignant neoplasm of breast: Secondary | ICD-10-CM | POA: Diagnosis not present

## 2020-10-07 DIAGNOSIS — J479 Bronchiectasis, uncomplicated: Secondary | ICD-10-CM | POA: Diagnosis not present

## 2020-10-17 LAB — CUP PACEART REMOTE DEVICE CHECK
Date Time Interrogation Session: 20220129234302
Implantable Pulse Generator Implant Date: 20200604

## 2020-10-18 ENCOUNTER — Ambulatory Visit (INDEPENDENT_AMBULATORY_CARE_PROVIDER_SITE_OTHER): Payer: Medicare Other

## 2020-10-18 DIAGNOSIS — G459 Transient cerebral ischemic attack, unspecified: Secondary | ICD-10-CM

## 2020-10-26 NOTE — Progress Notes (Signed)
Carelink Summary Report / Loop Recorder 

## 2020-11-04 ENCOUNTER — Encounter: Payer: Self-pay | Admitting: General Practice

## 2020-11-04 NOTE — Progress Notes (Signed)
[facility] CSW Progress Notes  Patient called, wanted information on completing Advance Directives.  CSW left VM w information and how to contact us to discuss further.  Also stated I will mail information on [facility] to her at home.  Edwyna Shell, LCSW Clinical Social Worker Phone:  [phone]

## 2020-11-08 DIAGNOSIS — L659 Nonscarring hair loss, unspecified: Secondary | ICD-10-CM | POA: Diagnosis not present

## 2020-11-08 DIAGNOSIS — Z6828 Body mass index (BMI) 28.0-28.9, adult: Secondary | ICD-10-CM | POA: Diagnosis not present

## 2020-11-08 DIAGNOSIS — F4321 Adjustment disorder with depressed mood: Secondary | ICD-10-CM | POA: Diagnosis not present

## 2020-11-08 DIAGNOSIS — C50311 Malignant neoplasm of lower-inner quadrant of right female breast: Secondary | ICD-10-CM | POA: Diagnosis not present

## 2020-11-08 DIAGNOSIS — J479 Bronchiectasis, uncomplicated: Secondary | ICD-10-CM | POA: Diagnosis not present

## 2020-11-08 DIAGNOSIS — E559 Vitamin D deficiency, unspecified: Secondary | ICD-10-CM | POA: Diagnosis not present

## 2020-11-10 ENCOUNTER — Encounter: Payer: Self-pay | Admitting: General Practice

## 2020-11-10 NOTE — Progress Notes (Signed)
[facility] CSW Progress Notes  Patient wants help updating her Advance Directives - she has been added to the [facility] on 3/14 at 12:30.  Edwyna Shell, LCSW Clinical Social Worker Phone:  [phone]

## 2020-11-22 ENCOUNTER — Ambulatory Visit (INDEPENDENT_AMBULATORY_CARE_PROVIDER_SITE_OTHER): Payer: Medicare Other

## 2020-11-22 DIAGNOSIS — G459 Transient cerebral ischemic attack, unspecified: Secondary | ICD-10-CM

## 2020-11-24 LAB — CUP PACEART REMOTE DEVICE CHECK
Date Time Interrogation Session: 20220301234348
Implantable Pulse Generator Implant Date: 20200604

## 2020-11-26 DIAGNOSIS — I10 Essential (primary) hypertension: Secondary | ICD-10-CM | POA: Diagnosis not present

## 2020-11-26 DIAGNOSIS — J479 Bronchiectasis, uncomplicated: Secondary | ICD-10-CM | POA: Diagnosis not present

## 2020-11-26 DIAGNOSIS — Z853 Personal history of malignant neoplasm of breast: Secondary | ICD-10-CM | POA: Diagnosis not present

## 2020-11-26 DIAGNOSIS — F4321 Adjustment disorder with depressed mood: Secondary | ICD-10-CM | POA: Diagnosis not present

## 2020-11-26 DIAGNOSIS — C50311 Malignant neoplasm of lower-inner quadrant of right female breast: Secondary | ICD-10-CM | POA: Diagnosis not present

## 2020-11-29 ENCOUNTER — Inpatient Hospital Stay: Payer: Medicare Other | Admitting: *Deleted

## 2020-11-30 NOTE — Progress Notes (Signed)
Carelink Summary Report / Loop Recorder 

## 2020-12-01 ENCOUNTER — Other Ambulatory Visit: Payer: Medicare Other | Admitting: General Practice

## 2020-12-10 ENCOUNTER — Other Ambulatory Visit: Payer: Self-pay

## 2020-12-10 ENCOUNTER — Inpatient Hospital Stay: Payer: Medicare Other | Attending: Licensed Clinical Social Worker | Admitting: Licensed Clinical Social Worker

## 2020-12-10 DIAGNOSIS — Z170 Estrogen receptor positive status [ER+]: Secondary | ICD-10-CM

## 2020-12-10 DIAGNOSIS — C50311 Malignant neoplasm of lower-inner quadrant of right female breast: Secondary | ICD-10-CM

## 2020-12-10 NOTE — Progress Notes (Signed)
[facility] Healthcare Advance Directives Clinical Social Work  Clinical Social Work was referred to review and complete healthcare advance directives.  Clinical Social Worker met with patient and chaplain in CSW office.  The patient designated Joan Whiteside as their primary healthcare agent.  Patient also completed healthcare living will.    Clinical Social Worker notarized documents and made copies for patient/family. Clinical Social Worker will send documents to medical records to be scanned into patient's chart. Clinical Social Worker encouraged patient/family to contact with any additional questions or concerns.    Michelle E Zavala LCSW, MSW, LCSW Clinical Social Worker [facility] [phone]        

## 2020-12-25 LAB — CUP PACEART REMOTE DEVICE CHECK
Date Time Interrogation Session: 20220402004738
Implantable Pulse Generator Implant Date: 20200604

## 2020-12-27 ENCOUNTER — Ambulatory Visit (INDEPENDENT_AMBULATORY_CARE_PROVIDER_SITE_OTHER): Payer: Medicare Other

## 2020-12-27 DIAGNOSIS — G459 Transient cerebral ischemic attack, unspecified: Secondary | ICD-10-CM | POA: Diagnosis not present

## 2021-01-07 NOTE — Progress Notes (Signed)
Carelink Summary Report / Loop Recorder 

## 2021-01-31 ENCOUNTER — Ambulatory Visit (INDEPENDENT_AMBULATORY_CARE_PROVIDER_SITE_OTHER): Payer: Medicare Other

## 2021-01-31 DIAGNOSIS — G459 Transient cerebral ischemic attack, unspecified: Secondary | ICD-10-CM | POA: Diagnosis not present

## 2021-02-01 LAB — CUP PACEART REMOTE DEVICE CHECK
Date Time Interrogation Session: 20220514231142
Implantable Pulse Generator Implant Date: 20200604

## 2021-02-22 NOTE — Progress Notes (Signed)
Carelink Summary Report / Loop Recorder 

## 2021-03-07 ENCOUNTER — Ambulatory Visit (INDEPENDENT_AMBULATORY_CARE_PROVIDER_SITE_OTHER): Payer: Medicare Other

## 2021-03-07 DIAGNOSIS — G459 Transient cerebral ischemic attack, unspecified: Secondary | ICD-10-CM | POA: Diagnosis not present

## 2021-03-08 LAB — CUP PACEART REMOTE DEVICE CHECK
Date Time Interrogation Session: 20220614232852
Implantable Pulse Generator Implant Date: 20200604

## 2021-03-24 NOTE — Progress Notes (Signed)
Patient Care Team: [name], MD as PCP - General (Family Medicine) Leonie Man, MD as PCP - Cardiology (Cardiology) Erroll Luna, MD as Consulting Physician (General Surgery) Kyung Rudd, MD as Consulting Physician (Radiation Oncology) Gordy Levan, MD as Consulting Physician (Oncology) Sylvan Cheese, NP as Nurse Practitioner (Hematology and Oncology)  DIAGNOSIS:    ICD-10-CM   1. Malignant neoplasm of lower-inner quadrant of right breast of female, estrogen receptor positive ([place])  C50.311    Z17.0       SUMMARY OF ONCOLOGIC HISTORY: Oncology History Overview Note  3/3/Multifocal T1N0 left breast at lumpectomy and sentinel node evaluation March 2010. ER PR +, Her2 negative. 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 Intolerant to Femara and Aromasin, Tamoxifen    08/31/2014 Procedure   Genetic testing: Mutation at [facility], [id number]    01/26/2015 Breast MRI   There is a 5 x 9 x 8 mm mass enhancement at the slight lower slight medial right breast anterior to middle depth with plateau enhancement kinetics.    02/09/2015 Initial Biopsy   Right breast core needle bx: negative for malignancy     02/24/2015 Procedure   Right breast core needle bx: DCIS with comedonecrosis, ER+ (95%), PR- (0%)    02/24/2015 Clinical Stage   Stage 0: Tis N0    04/07/2015 Definitive Surgery   Right lumpectomy: DCIS, intermediate grade, with necrosis; close lateral margin    04/07/2015 Pathologic Stage   Stage  0: pTis pNx    05/17/2015 - 06/14/2015 Radiation Therapy   Adjuvant RT([facility]): Right breast 42.5 Gy over 17 fractions; right breast boost 7.5 Gy over 3 fractions. Total dose: 50 Gy    09/02/2015 Survivorship   Survivorship care plan completed and mailed to patient in lieu of in person visit.      CHIEF COMPLIANT: Follow-up of recurrent breast cancer on tamoxifen  INTERVAL HISTORY: Monique Watson is a 80 y.o. 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 Mammogram on 05/06/20 showed no evidence of malignancy bilaterally. She presents to the clinic today for annual follow-up.  She reports to be feeling dizzy and lightheaded at times.  She has an appointment with cardiology and she will discuss this with them.  ALLERGIES:  is allergic to chlorhexidine, arimidex [anastrozole], diovan [valsartan], and uncoded nonscreenable allergen.  MEDICATIONS:  Current Outpatient Medications  Medication Sig Dispense Refill   albuterol (VENTOLIN HFA) 108 (90 Base) MCG/ACT inhaler Inhale 1-2 puffs into the lungs every 6 (six) hours as needed for wheezing or shortness of breath.     aspirin 81 MG tablet Take 81 mg by mouth daily.     azithromycin (ZITHROMAX) 250 MG tablet Take 2 on day one then 1 daily x 4 days 6 tablet 0   CALCIUM PO Take 1 tablet by mouth daily.     Cholecalciferol (VITAMIN D3) 2000 UNITS TABS Take 2,000 Units by mouth daily.      Omega-3 Fatty Acids (  FISH OIL CONCENTRATE) 1000 MG CAPS Take 1,000 mg by mouth daily.      potassium chloride (K-DUR,KLOR-CON) 10 MEQ tablet Take 10 mEq by mouth daily.     Probiotic Product (PROBIOTIC PO) Take 1 capsule by mouth daily.     Respiratory Therapy Supplies (FLUTTER) DEVI Use as directed 1 each 0   tamoxifen (NOLVADEX) 20 MG tablet TAKE 1 TABLET BY MOUTH EVERY DAY 90 tablet 3   vitamin E (VITAMIN E) 400 UNIT capsule Take 400 Units by mouth daily.      No current  facility-administered medications for this visit.    PHYSICAL EXAMINATION: ECOG PERFORMANCE STATUS: 1 - Symptomatic but completely ambulatory  Vitals:   03/25/21 1020  BP: (!) 150/65  Pulse: 84  Resp: 19  Temp: 97.7 F (36.5 C)  SpO2: 100%   Filed Weights   03/25/21 1020  Weight: 176 lb 5 oz (80 kg)    BREAST: No palpable masses or nodules in either right or left breasts. No palpable axillary supraclavicular or infraclavicular adenopathy no breast tenderness or nipple discharge. (exam performed in the presence of a chaperone)  LABORATORY DATA:  I have reviewed the data as listed CMP Latest Ref Rng & Units 02/17/2019 09/27/2018 09/27/2018  Glucose 65 - 99 mg/dL 87 - 141(H)  BUN 8 - 27 mg/dL 11 - 12  Creatinine 0.57 - 1.00 mg/dL 0.90 0.88 0.70  Sodium 134 - 144 mmol/L 145(H) - 142  Potassium 3.5 - 5.2 mmol/L 4.0 - 3.1(L)  Chloride 96 - 106 mmol/L 105 - 109  CO2 20 - 29 mmol/L 23 - -  Calcium 8.7 - 10.3 mg/dL 9.0 - -  Total Protein 6.5 - 8.1 g/dL - - -  Total Bilirubin 0.3 - 1.2 mg/dL - - -  Alkaline Phos 38 - 126 U/L - - -  AST 15 - 41 U/L - - -  ALT 0 - 44 U/L - - -    Lab Results  Component Value Date   WBC 5.4 02/17/2019   HGB 13.2 02/17/2019   HCT 39.7 02/17/2019   MCV 99 (H) 02/17/2019   PLT 100 (LL) 02/17/2019   NEUTROABS 3.6 09/27/2018    ASSESSMENT & PLAN:  Breast cancer of lower-inner quadrant of right female breast 03/2015 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   Breast cancer surveillance: 1.  Patient did not get annual breast MRI.  Last breast MRI was 02/20/2018.  I once again ordered another breast MRI.  She promised that she would do it this year.  Last year her son passed away from stroke and therefore she could not do  it. 2. mammogram at [facility]: 05/06/2020.  Benign, breast density category B 3.  Breast exam 03/25/2020: Benign   I [facility] to [facility] where she has 0 lower co-pay.  Dizziness and lightheadedness especially when she raises her arms: She is seeing cardiology who will be able to assist her regarding this. Return to clinic in 1 year for follow-up    No orders of the defined types were placed in this encounter.  The patient has a good understanding of the overall plan. she agrees with it. she will call with any problems that may develop before the next visit here.  Total time spent: 20 mins including face to face time and time spent for planning, charting and coordination of care  Rulon Eisenmenger, MD, MPH 03/25/2021  I, Thana Ates, am acting as scribe for Dr. Nicholas Lose.  I have reviewed the above documentation for accuracy and completeness, and I agree with the above.

## 2021-03-25 ENCOUNTER — Other Ambulatory Visit: Payer: Self-pay

## 2021-03-25 ENCOUNTER — Inpatient Hospital Stay: Payer: Medicare Other | Attending: Hematology and Oncology | Admitting: Hematology and Oncology

## 2021-03-25 VITALS — BP 150/65 | HR 84 | Temp 97.7°F | Resp 19 | Ht 64.0 in | Wt 176.3 lb

## 2021-03-25 DIAGNOSIS — C50311 Malignant neoplasm of lower-inner quadrant of right female breast: Secondary | ICD-10-CM | POA: Diagnosis not present

## 2021-03-25 DIAGNOSIS — Z1501 Genetic susceptibility to malignant neoplasm of breast: Secondary | ICD-10-CM

## 2021-03-25 DIAGNOSIS — Z9221 Personal history of antineoplastic chemotherapy: Secondary | ICD-10-CM | POA: Diagnosis not present

## 2021-03-25 DIAGNOSIS — R42 Dizziness and giddiness: Secondary | ICD-10-CM | POA: Diagnosis not present

## 2021-03-25 DIAGNOSIS — Z923 Personal history of irradiation: Secondary | ICD-10-CM | POA: Insufficient documentation

## 2021-03-25 DIAGNOSIS — Z170 Estrogen receptor positive status [ER+]: Secondary | ICD-10-CM | POA: Diagnosis not present

## 2021-03-25 DIAGNOSIS — Z1509 Genetic susceptibility to other malignant neoplasm: Secondary | ICD-10-CM

## 2021-03-25 DIAGNOSIS — Z79810 Long term (current) use of selective estrogen receptor modulators (SERMs): Secondary | ICD-10-CM | POA: Insufficient documentation

## 2021-03-25 MED ORDER — TAMOXIFEN CITRATE 20 MG PO TABS: 20.0000 mg | ORAL_TABLET | Freq: Every day | ORAL | 3 refills | Status: DC

## 2021-03-25 NOTE — Progress Notes (Signed)
Carelink Summary Report / Loop Recorder 

## 2021-03-25 NOTE — Assessment & Plan Note (Signed)
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 BRCA 2 positive 11/2008: Hx left breast mutlifocal T1N0 ER PR + and HER 2 negative  Current treatment: Tamoxifen  Tolerating tamoxifen extremely well without any problems or concerns.  Breast cancer surveillance: 1.  Patient did not get annual breast MRI.  Last breast MRI was 02/20/2018.   2. mammogram at [facility]: 05/06/2020.  Benign, breast density category B 3.  Breast exam 03/25/2020: Benign   Return to clinic in 1 year for follow-up

## 2021-04-04 DIAGNOSIS — Z20822 Contact with and (suspected) exposure to covid-19: Secondary | ICD-10-CM | POA: Diagnosis not present

## 2021-04-04 DIAGNOSIS — B349 Viral infection, unspecified: Secondary | ICD-10-CM | POA: Diagnosis not present

## 2021-04-04 DIAGNOSIS — R059 Cough, unspecified: Secondary | ICD-10-CM | POA: Diagnosis not present

## 2021-04-04 DIAGNOSIS — U071 COVID-19: Secondary | ICD-10-CM | POA: Diagnosis not present

## 2021-04-06 LAB — CUP PACEART REMOTE DEVICE CHECK
Date Time Interrogation Session: 20220716002638
Implantable Pulse Generator Implant Date: 20200604

## 2021-04-11 ENCOUNTER — Ambulatory Visit (INDEPENDENT_AMBULATORY_CARE_PROVIDER_SITE_OTHER): Payer: Medicare Other

## 2021-04-11 DIAGNOSIS — I639 Cerebral infarction, unspecified: Secondary | ICD-10-CM

## 2021-05-03 NOTE — Progress Notes (Signed)
Carelink Summary Report / Loop Recorder 

## 2021-05-04 ENCOUNTER — Ambulatory Visit (INDEPENDENT_AMBULATORY_CARE_PROVIDER_SITE_OTHER): Payer: Medicare Other

## 2021-05-04 DIAGNOSIS — I639 Cerebral infarction, unspecified: Secondary | ICD-10-CM

## 2021-05-04 LAB — CUP PACEART REMOTE DEVICE CHECK
Date Time Interrogation Session: 20220816005739
Implantable Pulse Generator Implant Date: 20200604

## 2021-05-18 DIAGNOSIS — Z1231 Encounter for screening mammogram for malignant neoplasm of breast: Secondary | ICD-10-CM | POA: Diagnosis not present

## 2021-05-20 ENCOUNTER — Ambulatory Visit: Payer: Medicare Other | Admitting: Internal Medicine

## 2021-05-24 NOTE — Progress Notes (Signed)
Carelink Summary Report / Loop Recorder 

## 2021-05-25 ENCOUNTER — Ambulatory Visit: Payer: Medicare Other | Admitting: Internal Medicine

## 2021-05-26 ENCOUNTER — Ambulatory Visit: Payer: Medicare Other | Admitting: Internal Medicine

## 2021-05-26 ENCOUNTER — Encounter: Payer: Self-pay | Admitting: Hematology and Oncology

## 2021-05-31 ENCOUNTER — Ambulatory Visit
Admission: RE | Admit: 2021-05-31 | Discharge: 2021-05-31 | Disposition: A | Discharge status: Home / Self Care | Payer: Medicare Other | Source: Ambulatory Visit | Attending: Hematology and Oncology | Admitting: Hematology and Oncology

## 2021-05-31 DIAGNOSIS — Z853 Personal history of malignant neoplasm of breast: Secondary | ICD-10-CM | POA: Diagnosis not present

## 2021-05-31 DIAGNOSIS — Z86000 Personal history of in-situ neoplasm of breast: Secondary | ICD-10-CM | POA: Diagnosis not present

## 2021-05-31 DIAGNOSIS — Z1509 Genetic susceptibility to other malignant neoplasm: Secondary | ICD-10-CM

## 2021-05-31 DIAGNOSIS — C50311 Malignant neoplasm of lower-inner quadrant of right female breast: Secondary | ICD-10-CM

## 2021-05-31 DIAGNOSIS — Z1501 Genetic susceptibility to malignant neoplasm of breast: Secondary | ICD-10-CM

## 2021-05-31 DIAGNOSIS — Z170 Estrogen receptor positive status [ER+]: Secondary | ICD-10-CM

## 2021-05-31 MED ORDER — GADOBUTROL 1 MMOL/ML IV SOLN
8.0000 mL | Freq: Once | INTRAVENOUS | Status: AC | PRN
  Administered 2021-05-31: 8 mL via INTRAVENOUS

## 2021-06-01 ENCOUNTER — Ambulatory Visit (INDEPENDENT_AMBULATORY_CARE_PROVIDER_SITE_OTHER): Payer: Medicare Other

## 2021-06-01 ENCOUNTER — Other Ambulatory Visit: Payer: Self-pay

## 2021-06-01 ENCOUNTER — Encounter: Payer: Self-pay | Admitting: Internal Medicine

## 2021-06-01 ENCOUNTER — Ambulatory Visit (INDEPENDENT_AMBULATORY_CARE_PROVIDER_SITE_OTHER): Payer: Medicare Other | Admitting: Internal Medicine

## 2021-06-01 VITALS — BP 118/78 | HR 70 | Temp 98.6°F | Ht 64.0 in | Wt 178.6 lb

## 2021-06-01 DIAGNOSIS — Z23 Encounter for immunization: Secondary | ICD-10-CM

## 2021-06-01 DIAGNOSIS — J479 Bronchiectasis, uncomplicated: Secondary | ICD-10-CM | POA: Diagnosis not present

## 2021-06-01 DIAGNOSIS — I639 Cerebral infarction, unspecified: Secondary | ICD-10-CM

## 2021-06-01 DIAGNOSIS — R918 Other nonspecific abnormal finding of lung field: Secondary | ICD-10-CM | POA: Diagnosis not present

## 2021-06-01 DIAGNOSIS — R059 Cough, unspecified: Secondary | ICD-10-CM | POA: Diagnosis not present

## 2021-06-01 MED ORDER — AZITHROMYCIN 250 MG PO TABS: ORAL_TABLET | ORAL | 11 refills | Status: DC

## 2021-06-01 NOTE — Progress Notes (Signed)
Subjective:     Patient ID: Monique Watson, female   DOB: October 02, 1940,    MRN: [id number]     Brief patient profile:  51  yobf quit smoking 11/ 2018 referred to pulmonary clinic 10/22/2017 by Dr   Monique Watson for eval of bronchiectasis with nl pfts 01/21/2018    History of Present Illness  10/22/2017 1st [name]/ Monique Watson   Chief Complaint  Patient presents with   Pulmonary Consult    Referred by Dr. Leighton Watson. Pt states had PNA back in Nov 2018. She is coughing with yellow sputum. She uses her albuterol inhaler at least once per day on average.   baseline pna "every other year"  x 10-15 years and fine in between so fine in fact that she could ex at gym and no need for inhalers Has not returned  to gym p most recent dx of pna in Nov 2018  Uses saba each helps bring up  Maybe a tbsp beige more than yellow and never bloody / no better with a zpak Sleeps flat ok  Doe = MMRC1 = can walk nl pace, flat grade, can't hurry or go uphills or steps s sob   rec Bronchiectasis    Work on inhaler technique:   Use the flutter valve as much as you can to help you bring up mucus      05/20/2020  f/u ov/Monique Watson re: bronchiectasis  Chief Complaint  Patient presents with   Follow-up    PFT done today. Breathing is overall doing well. Her cough is about the same- prod with yellow sputum.   Dyspnea:  Not limited by breathing from desired activities   Cough: not really a bother at present   Sleeping: ok bed is flat, one pillow  SABA use: not in a year  02: none  Rec 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 of your bronchial tubes  s Whenever you develop cough congestion take mucinex or mucinex dm > these will help keep the mucus loose and flowing but if your condition worsens you need to seek help immediately preferably here or somewhere inside the [facility] system to compare xrays ( worse = darker or bloody mucus or pain on breathing in)   If the mucus gets nasty >  zpak    06/01/2021  f/u  ov/Monique Watson re: bronchiectasis  maint on prn zpak   Chief Complaint  Patient presents with   Follow-up    Patient reports she is here for a follow up and no concerns,   Dyspnea:  not limited/ outdoor walking ok including hills Cough: no am flares Sleeping: bed is flat one pillow SABA use: none  02: no Covid status:   vax x 3    No obvious day to day or daytime variability or assoc excess/ purulent sputum or mucus plugs or hemoptysis or cp or chest tightness, subjective wheeze or overt sinus or hb symptoms.   Sleeping  without nocturnal  or early am exacerbation  of respiratory  c/o's or need for noct saba. Also denies any obvious fluctuation of symptoms with weather or environmental changes or other aggravating or alleviating factors except as outlined above   No unusual exposure hx or h/o childhood pna/ asthma or knowledge of premature birth.  Current Allergies, Complete Past Medical History, Past Surgical History, Family History, and Social History were reviewed in [employer] record.  ROS  The following are not active complaints unless bolded Hoarseness, sore throat, dysphagia,  dental problems, itching, sneezing,  nasal congestion or discharge of excess mucus or purulent secretions, ear ache,   fever, chills, sweats, unintended wt loss or wt gain, classically pleuritic or exertional cp,  orthopnea pnd or arm/hand swelling  or leg swelling, presyncope, palpitations, abdominal pain, anorexia, nausea, vomiting, diarrhea  or change in bowel habits or change in bladder habits, change in stools or change in urine, dysuria, hematuria,  rash, arthralgias, visual complaints, headache, numbness, weakness or ataxia or problems with walking or coordination,  change in mood or  memory.        Current Meds  Medication Sig   albuterol (VENTOLIN HFA) 108 (90 Base) MCG/ACT inhaler 2 puffs as needed Inhalation every 4 hrs for 30 days   aspirin 81 MG tablet Take 81 mg by mouth daily.    CALCIUM PO Take 1 tablet by mouth daily.   Cholecalciferol (VITAMIN D3) 2000 UNITS TABS Take 2,000 Units by mouth daily.    Omega-3 Fatty Acids (FISH OIL CONCENTRATE) 1000 MG CAPS Take 1,000 mg by mouth daily.    potassium chloride (K-DUR,KLOR-CON) 10 MEQ tablet Take 10 mEq by mouth daily.   Probiotic Product (PROBIOTIC PO) Take 1 capsule by mouth daily.   tamoxifen (NOLVADEX) 20 MG tablet Take 1 tablet (20 mg total) by mouth daily.   vitamin E 180 MG (400 UNITS) capsule Take 400 Units by mouth daily.                  Objective:   Physical Exam     06/01/2021       178  05/20/2020         180   01/21/2018         178   10/22/17 181 lb (82.1 kg)  03/06/17 169 lb (76.7 kg)  01/16/17 174 lb 6.4 oz (79.1 kg)    Vital signs reviewed  06/01/2021  - Note at rest 02 sats  99% on RA   General appearance:    amb pleasant bf nad      Reports top full denture    HEENT : pt wearing mask not removed for exam due to covid -19 concerns.    NECK :  without JVD/Nodes/TM/ nl carotid upstrokes bilaterally   LUNGS: no acc muscle use,  Nl contour chest with minimal insp pops/ squeaks both bases    CV:  RRR  no s3 or murmur or increase in P2, and no edema   ABD:  soft and nontender with nl inspiratory excursion in the supine position. No bruits or organomegaly appreciated, bowel sounds nl  MS:  Nl gait/ ext warm without deformities, calf tenderness, cyanosis or clubbing No obvious joint restrictions   SKIN: warm and dry without lesions    NEURO:  alert, approp, nl sensorium with  no motor or cerebellar deficits apparent.       CXR PA and Lateral:   06/01/2021 :    I personally reviewed images and agree with radiology impression as follows:      Increased bronchial wall thickening and reticulonodular opacities from the prior plain film, potentially representing progression of chronic fibrosis/scarring versus background changes and superimposed bronchitis/atypical infection.      Assessment:

## 2021-06-01 NOTE — Patient Instructions (Addendum)
Flu shot today  Whenever you develop cough congestion take mucinex or mucinex dm 1200 mg every 12 hours as needed > these will help keep the mucus loose and flowing but if your condition worsens you need to seek help immediately preferably here or somewhere inside the [facility] system to compare xrays ( worse = darker or bloody mucus or pain on breathing in)    If the mucus gets nasty >  zpak    Please remember to go to the  x-ray department  for your tests - we will call you with the results when they are available     Please schedule a follow up visit in 12  months but call sooner if needed Late add:  cxr worse > hrct ordered

## 2021-06-01 NOTE — Assessment & Plan Note (Signed)
See CT chest 08/17/17  - Spirometry 10/22/2017  wnl s am saba but active exp  rhonchi on exam  - Quant Ig's wnl  - Allergy profile 10/22/2017 >  Eos 0.1 /  IgE  45 RAST neg  - Quant TB 10/22/2017  Neg - Flutter valve 10/22/17  - PFT's  01/21/2018  FEV1 1.44 (90 % ) ratio 88  p no % improvement from saba p nothing prior to study with DLCO  60/62 % corrects to 89 % for alv volume   - PFT's  05/20/2020  FEV1 1.33 (86 % ) ratio 0.79  p 1 % improvement from saba p 0 prior to study with DLCO  18.73 (94%) corrects to 4.10 (139%)  for alv volume and FV curve min/mild concavity - 06/01/2021 cxr worse > HRCT rec     There has been no convincing clinical change but CxR worse to HRCT indicated   Discussed in detail all the  indications, usual  risks and alternatives  relative to the benefits with patient who agrees to proceed with w/u as outlined.     In meantime rec mucinex / mucinex dm prn          Each maintenance medication was reviewed in detail including emphasizing most importantly the difference between maintenance and prns and under what circumstances the prns are to be triggered using an action plan format where appropriate.  Total time for H and P, chart review, counseling, and generating customized AVS unique to this office visit / same day charting =  23 min

## 2021-06-06 ENCOUNTER — Ambulatory Visit (INDEPENDENT_AMBULATORY_CARE_PROVIDER_SITE_OTHER): Payer: Medicare Other

## 2021-06-06 DIAGNOSIS — I639 Cerebral infarction, unspecified: Secondary | ICD-10-CM | POA: Diagnosis not present

## 2021-06-07 LAB — CUP PACEART REMOTE DEVICE CHECK
Date Time Interrogation Session: 20220916011230
Implantable Pulse Generator Implant Date: 20200604

## 2021-06-10 NOTE — Progress Notes (Signed)
Carelink Summary Report / Loop Recorder 

## 2021-07-11 ENCOUNTER — Ambulatory Visit (INDEPENDENT_AMBULATORY_CARE_PROVIDER_SITE_OTHER): Payer: Medicare Other

## 2021-07-11 DIAGNOSIS — I639 Cerebral infarction, unspecified: Secondary | ICD-10-CM

## 2021-07-11 LAB — CUP PACEART REMOTE DEVICE CHECK
Date Time Interrogation Session: 20221017011646
Implantable Pulse Generator Implant Date: 20200604

## 2021-07-15 ENCOUNTER — Telehealth: Payer: Self-pay | Admitting: Internal Medicine

## 2021-07-15 NOTE — Telephone Encounter (Signed)
Spoke to patient, who is questioning if Dr. Melvyn Novas wants her to continue azithromycin daily. She was prescribed zpak on 06/01/2021 with 11 refills. She was under the impression that she was to only take this for acute sx.   Dr. Melvyn Novas, please advise. Thanks

## 2021-07-15 NOTE — Telephone Encounter (Signed)
A z pak with 6 pills is by definition a prn flare rx not something you take every day

## 2021-07-15 NOTE — Telephone Encounter (Signed)
Spoke with pt and explained prn z-pak usage. Pt stated understanding. Nothing further needed at this time.

## 2021-07-19 NOTE — Progress Notes (Signed)
Carelink Summary Report / Loop Recorder 

## 2021-08-08 LAB — CUP PACEART REMOTE DEVICE CHECK
Date Time Interrogation Session: 20221117001909
Implantable Pulse Generator Implant Date: 20200604

## 2021-08-15 ENCOUNTER — Ambulatory Visit (INDEPENDENT_AMBULATORY_CARE_PROVIDER_SITE_OTHER): Payer: Medicare Other

## 2021-08-15 DIAGNOSIS — I639 Cerebral infarction, unspecified: Secondary | ICD-10-CM | POA: Diagnosis not present

## 2021-08-18 DIAGNOSIS — F4321 Adjustment disorder with depressed mood: Secondary | ICD-10-CM | POA: Diagnosis not present

## 2021-08-18 DIAGNOSIS — J4541 Moderate persistent asthma with (acute) exacerbation: Secondary | ICD-10-CM | POA: Diagnosis not present

## 2021-08-18 DIAGNOSIS — C50311 Malignant neoplasm of lower-inner quadrant of right female breast: Secondary | ICD-10-CM | POA: Diagnosis not present

## 2021-08-18 DIAGNOSIS — I10 Essential (primary) hypertension: Secondary | ICD-10-CM | POA: Diagnosis not present

## 2021-08-18 DIAGNOSIS — J479 Bronchiectasis, uncomplicated: Secondary | ICD-10-CM | POA: Diagnosis not present

## 2021-08-18 DIAGNOSIS — Z853 Personal history of malignant neoplasm of breast: Secondary | ICD-10-CM | POA: Diagnosis not present

## 2021-08-23 DIAGNOSIS — B349 Viral infection, unspecified: Secondary | ICD-10-CM | POA: Diagnosis not present

## 2021-08-23 DIAGNOSIS — J111 Influenza due to unidentified influenza virus with other respiratory manifestations: Secondary | ICD-10-CM | POA: Diagnosis not present

## 2021-08-23 DIAGNOSIS — J4541 Moderate persistent asthma with (acute) exacerbation: Secondary | ICD-10-CM | POA: Diagnosis not present

## 2021-08-23 DIAGNOSIS — Z03818 Encounter for observation for suspected exposure to other biological agents ruled out: Secondary | ICD-10-CM | POA: Diagnosis not present

## 2021-08-23 NOTE — Progress Notes (Signed)
Carelink Summary Report / Loop Recorder 

## 2021-09-05 ENCOUNTER — Ambulatory Visit (INDEPENDENT_AMBULATORY_CARE_PROVIDER_SITE_OTHER): Payer: Medicare Other

## 2021-09-05 DIAGNOSIS — I639 Cerebral infarction, unspecified: Secondary | ICD-10-CM | POA: Diagnosis not present

## 2021-09-06 LAB — CUP PACEART REMOTE DEVICE CHECK
Date Time Interrogation Session: 20221218002432
Implantable Pulse Generator Implant Date: 20200604

## 2021-09-14 NOTE — Progress Notes (Signed)
Carelink Summary Report / Loop Recorder 

## 2021-09-18 DIAGNOSIS — Z20828 Contact with and (suspected) exposure to other viral communicable diseases: Secondary | ICD-10-CM | POA: Diagnosis not present

## 2021-10-10 ENCOUNTER — Ambulatory Visit (INDEPENDENT_AMBULATORY_CARE_PROVIDER_SITE_OTHER): Payer: Medicare Other

## 2021-10-10 DIAGNOSIS — I639 Cerebral infarction, unspecified: Secondary | ICD-10-CM

## 2021-10-10 LAB — CUP PACEART REMOTE DEVICE CHECK
Date Time Interrogation Session: 20230122234758
Implantable Pulse Generator Implant Date: 20200604

## 2021-10-19 DIAGNOSIS — E663 Overweight: Secondary | ICD-10-CM | POA: Diagnosis not present

## 2021-10-19 DIAGNOSIS — E559 Vitamin D deficiency, unspecified: Secondary | ICD-10-CM | POA: Diagnosis not present

## 2021-10-19 DIAGNOSIS — Z0000 Encounter for general adult medical examination without abnormal findings: Secondary | ICD-10-CM | POA: Diagnosis not present

## 2021-10-19 DIAGNOSIS — D696 Thrombocytopenia, unspecified: Secondary | ICD-10-CM | POA: Diagnosis not present

## 2021-10-19 DIAGNOSIS — E876 Hypokalemia: Secondary | ICD-10-CM | POA: Diagnosis not present

## 2021-10-19 DIAGNOSIS — J479 Bronchiectasis, uncomplicated: Secondary | ICD-10-CM | POA: Diagnosis not present

## 2021-10-19 DIAGNOSIS — Z8543 Personal history of malignant neoplasm of ovary: Secondary | ICD-10-CM | POA: Diagnosis not present

## 2021-10-19 DIAGNOSIS — I10 Essential (primary) hypertension: Secondary | ICD-10-CM | POA: Diagnosis not present

## 2021-10-19 DIAGNOSIS — R5383 Other fatigue: Secondary | ICD-10-CM | POA: Diagnosis not present

## 2021-10-19 DIAGNOSIS — C50311 Malignant neoplasm of lower-inner quadrant of right female breast: Secondary | ICD-10-CM | POA: Diagnosis not present

## 2021-10-19 DIAGNOSIS — Z23 Encounter for immunization: Secondary | ICD-10-CM | POA: Diagnosis not present

## 2021-10-20 NOTE — Progress Notes (Signed)
Carelink Summary Report / Loop Recorder 

## 2021-10-27 DIAGNOSIS — Z23 Encounter for immunization: Secondary | ICD-10-CM | POA: Diagnosis not present

## 2021-11-13 LAB — CUP PACEART REMOTE DEVICE CHECK
Date Time Interrogation Session: 20230224234738
Implantable Pulse Generator Implant Date: 20200604

## 2021-11-14 ENCOUNTER — Ambulatory Visit (INDEPENDENT_AMBULATORY_CARE_PROVIDER_SITE_OTHER): Payer: Medicare Other

## 2021-11-14 DIAGNOSIS — I639 Cerebral infarction, unspecified: Secondary | ICD-10-CM | POA: Diagnosis not present

## 2021-11-15 DIAGNOSIS — J479 Bronchiectasis, uncomplicated: Secondary | ICD-10-CM | POA: Diagnosis not present

## 2021-11-15 DIAGNOSIS — I10 Essential (primary) hypertension: Secondary | ICD-10-CM | POA: Diagnosis not present

## 2021-11-17 NOTE — Progress Notes (Signed)
Carelink Summary Report / Loop Recorder 

## 2021-11-20 DIAGNOSIS — Z20822 Contact with and (suspected) exposure to covid-19: Secondary | ICD-10-CM | POA: Diagnosis not present

## 2021-12-19 ENCOUNTER — Ambulatory Visit (INDEPENDENT_AMBULATORY_CARE_PROVIDER_SITE_OTHER): Payer: Medicare Other

## 2021-12-19 DIAGNOSIS — I639 Cerebral infarction, unspecified: Secondary | ICD-10-CM | POA: Diagnosis not present

## 2021-12-19 LAB — CUP PACEART REMOTE DEVICE CHECK
Date Time Interrogation Session: 20230401002858
Implantable Pulse Generator Implant Date: 20200604

## 2021-12-30 NOTE — Progress Notes (Signed)
Carelink Summary Report / Loop Recorder 

## 2022-01-03 DIAGNOSIS — Z1152 Encounter for screening for COVID-19: Secondary | ICD-10-CM | POA: Diagnosis not present

## 2022-01-03 DIAGNOSIS — Z20828 Contact with and (suspected) exposure to other viral communicable diseases: Secondary | ICD-10-CM | POA: Diagnosis not present

## 2022-01-05 DIAGNOSIS — Z20822 Contact with and (suspected) exposure to covid-19: Secondary | ICD-10-CM | POA: Diagnosis not present

## 2022-01-07 ENCOUNTER — Other Ambulatory Visit: Payer: Self-pay | Admitting: Hematology and Oncology

## 2022-01-16 DIAGNOSIS — Z20822 Contact with and (suspected) exposure to covid-19: Secondary | ICD-10-CM | POA: Diagnosis not present

## 2022-01-16 DIAGNOSIS — Z6827 Body mass index (BMI) 27.0-27.9, adult: Secondary | ICD-10-CM | POA: Diagnosis not present

## 2022-01-16 DIAGNOSIS — I10 Essential (primary) hypertension: Secondary | ICD-10-CM | POA: Diagnosis not present

## 2022-01-21 DIAGNOSIS — Z20822 Contact with and (suspected) exposure to covid-19: Secondary | ICD-10-CM | POA: Diagnosis not present

## 2022-01-25 DIAGNOSIS — Z20828 Contact with and (suspected) exposure to other viral communicable diseases: Secondary | ICD-10-CM | POA: Diagnosis not present

## 2022-02-08 DIAGNOSIS — Z20828 Contact with and (suspected) exposure to other viral communicable diseases: Secondary | ICD-10-CM | POA: Diagnosis not present

## 2022-02-14 DIAGNOSIS — I10 Essential (primary) hypertension: Secondary | ICD-10-CM | POA: Diagnosis not present

## 2022-02-14 DIAGNOSIS — Z20828 Contact with and (suspected) exposure to other viral communicable diseases: Secondary | ICD-10-CM | POA: Diagnosis not present

## 2022-02-14 DIAGNOSIS — I700 Atherosclerosis of aorta: Secondary | ICD-10-CM | POA: Diagnosis not present

## 2022-02-24 ENCOUNTER — Other Ambulatory Visit: Payer: Self-pay | Admitting: Family Medicine

## 2022-02-24 ENCOUNTER — Other Ambulatory Visit (HOSPITAL_COMMUNITY): Payer: Self-pay | Admitting: Family Medicine

## 2022-02-24 ENCOUNTER — Ambulatory Visit (HOSPITAL_COMMUNITY)
Admission: RE | Admit: 2022-02-24 | Discharge: 2022-02-24 | Disposition: A | Discharge status: Home / Self Care | Payer: Medicare Other | Source: Ambulatory Visit | Attending: Family Medicine | Admitting: Family Medicine

## 2022-02-24 DIAGNOSIS — Z6826 Body mass index (BMI) 26.0-26.9, adult: Secondary | ICD-10-CM | POA: Diagnosis not present

## 2022-02-24 DIAGNOSIS — R2990 Unspecified symptoms and signs involving the nervous system: Secondary | ICD-10-CM

## 2022-02-24 DIAGNOSIS — I6782 Cerebral ischemia: Secondary | ICD-10-CM | POA: Diagnosis not present

## 2022-02-24 DIAGNOSIS — I10 Essential (primary) hypertension: Secondary | ICD-10-CM | POA: Diagnosis not present

## 2022-02-24 DIAGNOSIS — R2689 Other abnormalities of gait and mobility: Secondary | ICD-10-CM | POA: Diagnosis not present

## 2022-02-24 DIAGNOSIS — I619 Nontraumatic intracerebral hemorrhage, unspecified: Secondary | ICD-10-CM | POA: Diagnosis not present

## 2022-02-24 DIAGNOSIS — R29818 Other symptoms and signs involving the nervous system: Secondary | ICD-10-CM | POA: Diagnosis not present

## 2022-02-24 DIAGNOSIS — E876 Hypokalemia: Secondary | ICD-10-CM | POA: Diagnosis not present

## 2022-02-24 DIAGNOSIS — R531 Weakness: Secondary | ICD-10-CM | POA: Diagnosis not present

## 2022-02-26 LAB — CUP PACEART REMOTE DEVICE CHECK
Date Time Interrogation Session: 20230606004136
Implantable Pulse Generator Implant Date: 20200604

## 2022-02-27 ENCOUNTER — Ambulatory Visit (INDEPENDENT_AMBULATORY_CARE_PROVIDER_SITE_OTHER): Payer: Medicare Other

## 2022-02-27 DIAGNOSIS — I639 Cerebral infarction, unspecified: Secondary | ICD-10-CM

## 2022-03-02 DIAGNOSIS — I10 Essential (primary) hypertension: Secondary | ICD-10-CM | POA: Diagnosis not present

## 2022-03-02 DIAGNOSIS — Z6826 Body mass index (BMI) 26.0-26.9, adult: Secondary | ICD-10-CM | POA: Diagnosis not present

## 2022-03-02 DIAGNOSIS — R2689 Other abnormalities of gait and mobility: Secondary | ICD-10-CM | POA: Diagnosis not present

## 2022-03-02 DIAGNOSIS — R531 Weakness: Secondary | ICD-10-CM | POA: Diagnosis not present

## 2022-03-08 ENCOUNTER — Telehealth: Payer: Self-pay | Admitting: Hematology and Oncology

## 2022-03-08 NOTE — Telephone Encounter (Signed)
Rescheduled appointment per room/resource. Patient is aware of the changes made to her upcoming appointment. 

## 2022-03-22 NOTE — Progress Notes (Signed)
Carelink Summary Report / Loop Recorder 

## 2022-03-24 ENCOUNTER — Ambulatory Visit: Payer: Medicare Other | Admitting: Hematology and Oncology

## 2022-03-26 LAB — CUP PACEART REMOTE DEVICE CHECK
Date Time Interrogation Session: 20230709004310
Implantable Pulse Generator Implant Date: 20200604

## 2022-03-28 ENCOUNTER — Other Ambulatory Visit: Payer: Self-pay

## 2022-03-28 ENCOUNTER — Inpatient Hospital Stay: Payer: Medicare Other | Attending: Hematology and Oncology | Admitting: Hematology and Oncology

## 2022-03-28 ENCOUNTER — Other Ambulatory Visit: Payer: Self-pay | Admitting: *Deleted

## 2022-03-28 DIAGNOSIS — C50311 Malignant neoplasm of lower-inner quadrant of right female breast: Secondary | ICD-10-CM | POA: Diagnosis not present

## 2022-03-28 DIAGNOSIS — Z923 Personal history of irradiation: Secondary | ICD-10-CM | POA: Diagnosis not present

## 2022-03-28 DIAGNOSIS — Z79810 Long term (current) use of selective estrogen receptor modulators (SERMs): Secondary | ICD-10-CM | POA: Insufficient documentation

## 2022-03-28 DIAGNOSIS — Z170 Estrogen receptor positive status [ER+]: Secondary | ICD-10-CM | POA: Diagnosis not present

## 2022-03-28 MED ORDER — OXYCODONE-ACETAMINOPHEN 5-325 MG PO TABS: 1.0000 | ORAL_TABLET | Freq: Three times a day (TID) | ORAL | 0 refills | Status: DC | PRN

## 2022-03-28 MED ORDER — TAMOXIFEN CITRATE 20 MG PO TABS: 20.0000 mg | ORAL_TABLET | Freq: Every day | ORAL | 3 refills | Status: DC

## 2022-03-28 NOTE — Assessment & Plan Note (Signed)
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  Breast cancer surveillance: 1. Breast MRI 06/01/2021: Benign breast density category B 2.mammogram at [facility]:  05/18/2021.  Benign, breast density category B 3.Breast exam 03/28/2022: Benign   [facility] where she has 0 lower co-pay.  Return to clinic in 1 year for follow-up

## 2022-03-28 NOTE — Progress Notes (Signed)
Patient Care Team: Kathyrn Lass, MD as PCP - General (Family Medicine) Leonie Man, MD as PCP - Cardiology (Cardiology) Erroll Luna, MD as Consulting Physician (General Surgery) Kyung Rudd, MD as Consulting Physician (Radiation Oncology) Gordy Levan, MD as Consulting Physician (Oncology) Sylvan Cheese, NP as Nurse Practitioner (Hematology and Oncology)  DIAGNOSIS:  Encounter Diagnosis  Name Primary?   Malignant neoplasm of lower-inner quadrant of right breast of female, estrogen receptor positive ([place])     SUMMARY OF ONCOLOGIC HISTORY: Oncology History Overview Note  3/3/Multifocal T1N0 left breast at lumpectomy and sentinel node evaluation March 2010. ER PR +, Her2 negative. 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 Intolerant to Femara and Aromasin, Tamoxifen   08/31/2014 Procedure   Genetic testing: Mutation at [facility], [id number]   01/26/2015 Breast MRI   There is a 5 x 9 x 8 mm mass enhancement at the slight lower slight medial right breast anterior to middle depth with plateau enhancement kinetics.   02/09/2015 Initial Biopsy   Right breast core needle bx: negative for malignancy    02/24/2015 Procedure   Right breast core needle bx: DCIS with comedonecrosis, ER+ (95%), PR- (0%)   02/24/2015 Clinical Stage   Stage 0: Tis N0   04/07/2015 Definitive Surgery   Right lumpectomy: DCIS, intermediate grade, with necrosis; close lateral margin   04/07/2015 Pathologic Stage   Stage 0: pTis pNx   05/17/2015  - 06/14/2015 Radiation Therapy   Adjuvant RT([facility]): Right breast 42.5 Gy over 17 fractions; right breast boost 7.5 Gy over 3 fractions. Total dose: 50 Gy   09/02/2015 Survivorship   Survivorship care plan completed and mailed to patient in lieu of in person visit.     CHIEF COMPLIANT: Follow-up of recurrent breast cancer on tamoxifen  INTERVAL HISTORY: Monique Watson is a 81 y.o. 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 She presents to the clinic today for a follow-up. She states that she has been doing good. Denies hot flashes and any side effects from the tamoxifen.    ALLERGIES:  is allergic to chlorhexidine, arimidex [anastrozole], diovan [valsartan], and uncoded nonscreenable allergen.  MEDICATIONS:  Current Outpatient Medications  Medication Sig Dispense Refill   albuterol (VENTOLIN HFA) 108 (90 Base) MCG/ACT inhaler 2 puffs as needed Inhalation every 4 hrs for 30 days     amLODipine (NORVASC) 5 MG tablet Take 1 tablet by mouth daily.     amLODipine (NORVASC) 5 MG tablet Take 5 mg by mouth daily.     aspirin 81 MG tablet Take 81 mg by mouth daily.     azithromycin (ZITHROMAX) 250 MG tablet 2 on day one then 1 daily x 5 days 6 tablet 11   Calcium Carbonate-Vit D-Min (CALCIUM 1200) 1200-1000 MG-UNIT CHEW 1 tablet with a meal     CALCIUM PO Take 1 tablet by mouth daily.     Cholecalciferol (VITAMIN D3) 2000 UNITS TABS Take 2,000 Units by mouth daily.  Cholecalciferol 50 MCG (2000 UT) CAPS 1 capsule     docusate sodium (COLACE) 100 MG capsule 1 capsule daily for constipation     losartan (COZAAR) 100 MG tablet Take 1 tablet by mouth daily.     Omega-3 Fatty Acids (FISH OIL CONCENTRATE) 1000 MG CAPS Take 1,000 mg by mouth daily.      potassium chloride (K-DUR,KLOR-CON) 10 MEQ tablet Take 10 mEq by mouth daily.     Probiotic Product (PROBIOTIC PO) Take 1 capsule by mouth daily.     tamoxifen  (NOLVADEX) 20 MG tablet Take 1 tablet (20 mg total) by mouth daily. 90 tablet 3   vitamin E 180 MG (400 UNITS) capsule Take 400 Units by mouth daily.      No current facility-administered medications for this visit.    PHYSICAL EXAMINATION: ECOG PERFORMANCE STATUS: 1 - Symptomatic but completely ambulatory  Vitals:   03/28/22 1340  BP: (!) 144/68  Pulse: 86  Resp: 18  Temp: 97.7 F (36.5 C)  SpO2: 100%   Filed Weights   03/28/22 1340  Weight: 164 lb 4.8 oz (74.5 kg)    BREAST: No palpable masses or nodules in either right or left breasts. No palpable axillary supraclavicular or infraclavicular adenopathy no breast tenderness or nipple discharge. (exam performed in the presence of a chaperone)  LABORATORY DATA:  I have reviewed the data as listed    Latest Ref Rng & Units 02/17/2019   11:36 AM 09/27/2018    7:38 PM 09/27/2018   12:53 PM  CMP  Glucose 65 - 99 mg/dL 87   141   BUN 8 - 27 mg/dL 11   12   Creatinine 0.57 - 1.00 mg/dL 0.90  0.88  0.70   Sodium 134 - 144 mmol/L 145   142   Potassium 3.5 - 5.2 mmol/L 4.0   3.1   Chloride 96 - 106 mmol/L 105   109   CO2 20 - 29 mmol/L 23     Calcium 8.7 - 10.3 mg/dL 9.0       Lab Results  Component Value Date   WBC 5.4 02/17/2019   HGB 13.2 02/17/2019   HCT 39.7 02/17/2019   MCV 99 (H) 02/17/2019   PLT 100 (LL) 02/17/2019   NEUTROABS 3.6 09/27/2018    ASSESSMENT & PLAN:  Breast cancer of lower-inner quadrant of right female breast 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   Breast cancer surveillance: 1.  Breast MRI 06/01/2021: Benign breast density category B 2. mammogram at [facility]:  05/18/2021.  Benign, breast density category B 3.  Breast exam 03/28/2022: Benign    I sent a  [facility] where she has 0 lower co-pay.   Return to clinic in 1 year for follow-up  No orders of the defined types were placed in this encounter.  The patient has a good understanding of the overall plan. she agrees with it. she will call with any problems that may develop before the next visit here. Total time spent: 30 mins including face to face time and time spent for planning, charting and co-ordination of care   Harriette Ohara, MD 03/28/22    I Gardiner Coins am scribing for Dr. Lindi Adie  I have reviewed the  above documentation for accuracy and completeness, and I agree with the above.

## 2022-04-03 ENCOUNTER — Ambulatory Visit (INDEPENDENT_AMBULATORY_CARE_PROVIDER_SITE_OTHER): Payer: Medicare Other

## 2022-04-03 DIAGNOSIS — I639 Cerebral infarction, unspecified: Secondary | ICD-10-CM | POA: Diagnosis not present

## 2022-05-08 ENCOUNTER — Ambulatory Visit (INDEPENDENT_AMBULATORY_CARE_PROVIDER_SITE_OTHER): Payer: Medicare Other

## 2022-05-08 DIAGNOSIS — I639 Cerebral infarction, unspecified: Secondary | ICD-10-CM | POA: Diagnosis not present

## 2022-05-08 NOTE — Progress Notes (Signed)
Carelink Summary Report / Loop Recorder 

## 2022-05-09 LAB — CUP PACEART REMOTE DEVICE CHECK
Date Time Interrogation Session: 20230820232558
Implantable Pulse Generator Implant Date: 20200604

## 2022-05-14 DIAGNOSIS — J189 Pneumonia, unspecified organism: Secondary | ICD-10-CM | POA: Diagnosis not present

## 2022-05-14 DIAGNOSIS — R051 Acute cough: Secondary | ICD-10-CM | POA: Diagnosis not present

## 2022-05-15 ENCOUNTER — Telehealth: Payer: Self-pay | Admitting: Internal Medicine

## 2022-05-15 NOTE — Telephone Encounter (Signed)
Last pfts do not suggest albutero will help but if she wants one fine to use it prn but bring it back to office so I can make sure she's using it correctly

## 2022-05-15 NOTE — Telephone Encounter (Signed)
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 Tanda Rockers, MD (Physician) at 06/01/2021  2:07 PM - Signed  Flu shot today   Whenever you develop cough congestion take mucinex or mucinex dm 1200 mg every 12 hours as needed > these will help keep the mucus loose and flowing but if your condition worsens you need to seek help immediately preferably here or somewhere inside the [facility] system to compare xrays ( worse = darker or bloody mucus or pain on breathing in)     If the mucus gets nasty >  zpak      Please remember to go to the  x-ray department  for your tests - we will call you with the results when they are available       Please schedule a follow up visit in 12  months but call sooner if needed Late add:  cxr worse > hrct ordered          Assessment & Plan Note by Tanda Rockers, MD at 06/01/2021 6:50 PM  Author: Tanda Rockers, MD Author Type: Physician Filed: 06/01/2021  6:51 PM  Note Status: Written Cosign: Cosign Not Required Encounter Date: 06/01/2021  Problem: Obstructive bronchiectasis ([name]) with probable MAI colonization   Editor: Tanda Rockers, MD (Physician)             See CT chest 08/17/17  - Spirometry 10/22/2017  wnl s am saba but active exp  rhonchi on exam  - Quant Ig's wnl  - Allergy profile 10/22/2017 >  Eos 0.1 /  IgE  45 RAST neg  - Quant TB 10/22/2017  Neg - Flutter valve 10/22/17  - PFT's  01/21/2018  FEV1 1.44 (90 % ) ratio 88  p no % improvement from saba p nothing prior to study  with DLCO  60/62 % corrects to 89 % for alv volume   - PFT's  05/20/2020  FEV1 1.33 (86 % ) ratio 0.79  p 1 % improvement from saba p 0 prior to study with DLCO  18.73 (94%) corrects to 4.10 (139%)  for alv volume and FV curve min/mild concavity - 06/01/2021 cxr worse > HRCT rec      There has been no convincing clinical change but CxR worse to HRCT indicated    Discussed in detail all the  indications, usual  risks and alternatives  relative to the benefits with patient who agrees to proceed with w/u as outlined.      In meantime rec mucinex / mucinex dm prn            Each maintenance medication was reviewed in detail including emphasizing most importantly the difference between maintenance and prns and under what circumstances the prns are to be triggered using an action plan format where appropriate.   Total time for H and P, chart review, counseling, and generating customized AVS unique to this office visit / same day charting =  23 min             Orthostatic Vitals Recorded in This Encounter   06/01/2021  1347     Patient Position: Sitting  BP Location: Left Arm  Cuff Size: Normal   Instructions  Flu shot today   Whenever you develop cough congestion take mucinex or mucinex dm 1200 mg every 12 hours as needed > these will help keep the mucus loose and flowing but if your condition worsens you need to seek help immediately preferably here or somewhere inside the [facility] system to compare xrays ( worse = darker or bloody mucus or pain on breathing in)     If the mucus gets nasty >  zpak      Please remember to go to the  x-ray department  for your tests - we will call you with the results when they are available       Please schedule a follow up visit in 12  months but call sooner if needed Late add:  cxr worse > hrct ordered       Was appointment offered to patient (explain)?  Scheduled for 05/26/2022 with Dr. Melvyn Novas at 11:15 am, advised to arrive at 11 am for check  in.   Reason for call: Patient went to [facility] on Sunday 05/14/22, had an x-ray showing some pna in both lungs.  She was prescribed Doxycycline.  She does not have a rescue inhaler or nebulizer.  Dr. Melvyn Novas, do you want her to have a rescue inhaler or a nebulizer?  Do you want her to have a cxr prior to her f/u in September?  Please advise.  Thank you.   (examples of things to ask: : When did symptoms start? Fever? Cough? Productive? Color to sputum? More sputum than usual? Wheezing? Have you needed increased oxygen? Are you taking your respiratory medications? What over the counter measures have you tried?)  Allergies  Allergen Reactions   Chlorhexidine Anaphylaxis, Hives, Shortness Of Breath and Itching   Arimidex [Anastrozole] Other (See Comments)    Body aches per patient. Irven Baltimore, RN.   Diovan [Valsartan] Other (See Comments)    arthralgias   Uncoded Nonscreenable Allergen Other (See Comments)    Blood pressure medication patient stated that she has a low tolerance for    Immunization History  Administered Date(s) Administered   Fluad Quad(high Dose 65+) 06/01/2021   Influenza Split 09/29/2011, 06/09/2015, 07/18/2019   Influenza Whole 06/18/2017   Influenza, High Dose Seasonal PF 08/01/2017, 05/31/2018, 07/18/2019   Influenza,inj,Quad PF,6+ Mos 07/15/2014, 06/09/2015, 08/21/2016   Influenza-Unspecified 09/29/2011, 07/15/2014, 06/09/2015, 08/21/2016   PFIZER(Purple Top)SARS-COV-2 Vaccination 11/17/2019, 12/07/2019, 07/06/2020   Pneumococcal Conjugate-13 07/23/2019   Pneumococcal Polysaccharide-23 08/17/2011   Td 04/18/2019   Zoster Recombinat (Shingrix) 07/18/2019, 02/28/2020   Zoster, Live 04/03/2013, 07/18/2019      Told she had pna on Saturday morning.

## 2022-05-15 NOTE — Telephone Encounter (Signed)
Called and spoke to patients daughter Remo Lipps and she voiced understanding of Dr. Morrison Old recommendations for patient. She states that they are going to try medications prescribed by [facility] first and will call back if they need the albuterol inhaler or if patient has not improved. Nothing further needed at this time.

## 2022-05-24 ENCOUNTER — Encounter: Payer: Self-pay | Admitting: Hematology and Oncology

## 2022-05-24 DIAGNOSIS — Z1231 Encounter for screening mammogram for malignant neoplasm of breast: Secondary | ICD-10-CM | POA: Diagnosis not present

## 2022-05-26 ENCOUNTER — Ambulatory Visit: Payer: Medicare Other | Admitting: Internal Medicine

## 2022-06-01 ENCOUNTER — Ambulatory Visit: Payer: Medicare Other | Admitting: Internal Medicine

## 2022-06-03 NOTE — Progress Notes (Signed)
Carelink Summary Report / Loop Recorder 

## 2022-06-12 ENCOUNTER — Ambulatory Visit (INDEPENDENT_AMBULATORY_CARE_PROVIDER_SITE_OTHER): Payer: Medicare Other

## 2022-06-12 DIAGNOSIS — I639 Cerebral infarction, unspecified: Secondary | ICD-10-CM | POA: Diagnosis not present

## 2022-06-12 LAB — CUP PACEART REMOTE DEVICE CHECK
Date Time Interrogation Session: 20230922231651
Implantable Pulse Generator Implant Date: 20200604

## 2022-06-18 NOTE — Progress Notes (Unsigned)
Subjective:     Patient ID: Monique Watson, female   DOB: 02/14/1941,    MRN: [id number]     Brief patient profile:  79 yobf quit smoking 11/ 2018 referred to pulmonary clinic 10/22/2017 by Dr   Drema Dallas for eval of bronchiectasis with nl pfts 01/21/2018    History of Present Illness  10/22/2017 1st [facility] office visit/ [name]   Chief Complaint  Patient presents with   Pulmonary Consult    Referred by Dr. [name]. Pt states had PNA back in Nov 2018. She is coughing with yellow sputum. She uses her albuterol inhaler at least once per day on average.   baseline pna "every other year"  x 10-15 years and fine in between so fine in fact that she could ex at gym and no need for inhalers Has not returned  to gym p most recent dx of pna in Nov 2018  Uses saba each helps bring up  Maybe a tbsp beige more than yellow and never bloody / no better with a zpak Sleeps flat ok  Doe = MMRC1 = can walk nl pace, flat grade, can't hurry or go uphills or steps s sob   rec Bronchiectasis    Work on inhaler technique:   Use the flutter valve as much as you can to help you bring up mucus      05/20/2020  f/u ov/Benigno Check re: bronchiectasis  Chief Complaint  Patient presents with   Follow-up    PFT done today. Breathing is overall doing well. Her cough is about the same- prod with yellow sputum.   Dyspnea:  Not limited by breathing from desired activities   Cough: not really a bother at present   Sleeping: ok bed is flat, one pillow  SABA use: not in a year  02: none  Rec 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 of your bronchial tubes  s Whenever you develop cough congestion take mucinex or mucinex dm > these will help keep the mucus loose and flowing but if your condition worsens you need to seek help immediately preferably here or somewhere inside the [facility] system to compare xrays ( worse = darker or bloody mucus or pain on breathing in)   If the mucus gets nasty >  zpak    06/01/2021  f/u ov/[name]  re: bronchiectasis  maint on prn zpak   Chief Complaint  Patient presents with   Follow-up    Patient reports she is here for a follow up and no concerns,   Dyspnea:  not limited/ outdoor walking ok including hills Cough: no am flares Sleeping: bed is flat one pillow SABA use: none  02: no Covid status:   vax x 3  Rec  Whenever you develop cough congestion take mucinex or mucinex dm 1200 mg every 12 hours as needed > these will help keep the mucus loose and flowing but if your condition worsens you need to seek help immediately preferably here or somewhere inside the [facility] system to compare xrays ( worse = darker or bloody mucus or pain on breathing in)   If the mucus gets nasty >  zpak  Late add:  cxr worse > hrct ordered  > not done as of 06/19/2022     06/19/2022  f/u ov/[name] re: bronchiectasis no maint rx  Chief Complaint  Patient presents with   Follow-up    She c/o fatigue and dizziness over the past wk. She states sometimes she has "cramps all  over".    Dyspnea:  not sob but very sedentary   Cough: rattling / thick creamy mucus in am never bloody, not sued zmax recently  Sleeping: feels fine flat s veritgo/ one pillow SABA use: none  02: none  L arm sts just distal to volar elbow with from L elbow and no swelling distally  Light head  x 1  week - says working on better bp rx with PCP with f/u this afternoon CT due now    No obvious day to day or daytime variability or assoc   mucus plugs or hemoptysis or cp or chest tightness, subjective wheeze or overt sinus or hb symptoms.   Sleeping as above without nocturnal   exacerbation  of respiratory  c/o's or need for noct saba. Also denies any obvious fluctuation of symptoms with weather or environmental changes or other aggravating or alleviating factors except as outlined above   No unusual exposure hx or h/o childhood pna/ asthma or knowledge of premature birth.  Current Allergies, Complete Past Medical History, Past Surgical  History, Family History, and Social History were reviewed in [employer] record.  ROS  The following are not active complaints unless bolded Hoarseness, sore throat, dysphagia, dental problems, itching, sneezing,  nasal congestion or discharge of excess mucus or purulent secretions, ear ache,   fever, chills, sweats, unintended wt loss or wt gain, classically pleuritic or exertional cp,  orthopnea pnd or arm/hand swelling  or leg swelling, presyncope, palpitations, abdominal pain, anorexia, nausea, vomiting, diarrhea  or change in bowel habits or change in bladder habits, change in stools or change in urine, dysuria, hematuria,  rash, arthralgias, visual complaints, headache, numbness, weakness or ataxia or problems with walking or coordination,  change in mood or  memory. Muscle cramps         Current Meds  Medication Sig   albuterol (VENTOLIN HFA) 108 (90 Base) MCG/ACT inhaler 2 puffs as needed Inhalation every 4 hrs for 30 days   amLODipine (NORVASC) 5 MG tablet Take 5 mg by mouth daily.   aspirin 81 MG tablet Take 81 mg by mouth daily.   Calcium Carbonate-Vit D-Min (CALCIUM 1200) 1200-1000 MG-UNIT CHEW 1 tablet with a meal   CALCIUM PO Take 1 tablet by mouth daily.   Cholecalciferol (VITAMIN D3) 2000 UNITS TABS Take 2,000 Units by mouth daily.    Cholecalciferol 50 MCG (2000 UT) CAPS 1 capsule   docusate sodium (COLACE) 100 MG capsule 1 capsule daily for constipation   losartan (COZAAR) 100 MG tablet Take 1 tablet by mouth daily.   Omega-3 Fatty Acids (FISH OIL CONCENTRATE) 1000 MG CAPS Take 1,000 mg by mouth daily.    potassium chloride (K-DUR,KLOR-CON) 10 MEQ tablet Take 10 mEq by mouth daily.   Probiotic Product (PROBIOTIC PO) Take 1 capsule by mouth daily.   tamoxifen (NOLVADEX) 20 MG tablet Take 1 tablet (20 mg total) by mouth daily.   vitamin E 180 MG (400 UNITS) capsule Take 400 Units by mouth daily.                Objective:   Physical Exam    Wts  06/19/2022       155  06/01/2021       178  05/20/2020         180   01/21/2018         178   10/22/17 181 lb (82.1 kg)  03/06/17 169 lb (76.7 kg)  01/16/17 174 lb  6.4 oz (79.1 kg)    Vital signs reviewed  06/19/2022  - Note at rest 02 sats  98% on RA   General appearance:   amb elderly bf nad     HEENT : Oropharynx  clear/ top denture     Nasal turbinates nl    NECK :  without  apparent JVD/ palpable Nodes/TM    LUNGS: no acc muscle use,  Nl contour chest with min insp rhonchi  bilaterally without cough on insp or exp maneuvers   CV:  RRR  no s3 or murmur or increase in P2, and no edema   ABD:  soft and nontender with nl inspiratory excursion in the supine position. No bruits or organomegaly appreciated   MS:  Nl gait/ ext warm without deformities Or obvious joint restrictions  calf tenderness, cyanosis or clubbing    SKIN: warm and dry without lesions    NEURO:  alert, approp, nl sensorium with  no motor or cerebellar deficits apparent.            Assessment:

## 2022-06-19 ENCOUNTER — Ambulatory Visit (INDEPENDENT_AMBULATORY_CARE_PROVIDER_SITE_OTHER): Payer: Medicare Other | Admitting: Internal Medicine

## 2022-06-19 ENCOUNTER — Encounter: Payer: Self-pay | Admitting: Internal Medicine

## 2022-06-19 DIAGNOSIS — N390 Urinary tract infection, site not specified: Secondary | ICD-10-CM | POA: Diagnosis not present

## 2022-06-19 DIAGNOSIS — N183 Chronic kidney disease, stage 3 unspecified: Secondary | ICD-10-CM | POA: Diagnosis not present

## 2022-06-19 DIAGNOSIS — M791 Myalgia, unspecified site: Secondary | ICD-10-CM | POA: Diagnosis not present

## 2022-06-19 DIAGNOSIS — I10 Essential (primary) hypertension: Secondary | ICD-10-CM | POA: Diagnosis not present

## 2022-06-19 DIAGNOSIS — I700 Atherosclerosis of aorta: Secondary | ICD-10-CM | POA: Diagnosis not present

## 2022-06-19 DIAGNOSIS — E559 Vitamin D deficiency, unspecified: Secondary | ICD-10-CM | POA: Diagnosis not present

## 2022-06-19 DIAGNOSIS — D649 Anemia, unspecified: Secondary | ICD-10-CM | POA: Diagnosis not present

## 2022-06-19 DIAGNOSIS — Z23 Encounter for immunization: Secondary | ICD-10-CM | POA: Diagnosis not present

## 2022-06-19 DIAGNOSIS — I639 Cerebral infarction, unspecified: Secondary | ICD-10-CM | POA: Diagnosis not present

## 2022-06-19 DIAGNOSIS — R252 Cramp and spasm: Secondary | ICD-10-CM | POA: Diagnosis not present

## 2022-06-19 DIAGNOSIS — J479 Bronchiectasis, uncomplicated: Secondary | ICD-10-CM | POA: Diagnosis not present

## 2022-06-19 DIAGNOSIS — R42 Dizziness and giddiness: Secondary | ICD-10-CM | POA: Diagnosis not present

## 2022-06-19 MED ORDER — AZITHROMYCIN 250 MG PO TABS: ORAL_TABLET | ORAL | 11 refills | Status: DC

## 2022-06-19 NOTE — Patient Instructions (Addendum)
My office will be contacting you by phone for referral for CT chest   - if you don't hear back from my office within one week please call us back or notify us thru MyChart and we'll address it right away.    Whenever you develop cough congestion take mucinex or mucinex dm 1200 mg every 12 hours as needed > these will help keep the mucus loose and flowing but if your condition worsens you need to seek help immediately preferably here or somewhere inside the [facility] system to compare xrays ( worse = darker or bloody mucus or pain on breathing in)    If the mucus gets nasty >  zpak   Please schedule a follow up visit in 12  months but call sooner if needed

## 2022-06-19 NOTE — Assessment & Plan Note (Addendum)
See CT chest 08/17/17  - Spirometry 10/22/2017  wnl s am saba but active exp  rhonchi on exam  - Quant Ig's wnl  - Allergy profile 10/22/2017 >  Eos 0.1 /  IgE  45 RAST neg  - Quant TB 10/22/2017  Neg - Flutter valve 10/22/17  - PFT's  01/21/2018  FEV1 1.44 (90 % ) ratio 88  p no % improvement from saba p nothing prior to study with DLCO  60/62 % corrects to 89 % for alv volume   - PFT's  05/20/2020  FEV1 1.33 (86 % ) ratio 0.79  p 1 % improvement from saba p 0 prior to study with DLCO  18.73 (94%) corrects to 4.10 (139%)  for alv volume and FV curve min/mild concavity - 06/01/2021 cxr worse > HRCT rec  Not done  - HRCT chest  06/19/2022 >>>   Assoc with wt loss so needs f/u HRCT as prev planned looking for evidence of progression or assoc superinfection with MAI  And in meantime rec zpak prn change in sputum  Also needs general eval planned for this afternoon so no additional w/u needed for now    Each maintenance medication was reviewed in detail including emphasizing most importantly the difference between maintenance and prns and under what circumstances the prns are to be triggered using an action plan format where appropriate.  Total time for H and P, chart review, counseling,   and generating customized AVS unique to this office visit / same day charting = 25 min

## 2022-06-22 NOTE — Progress Notes (Signed)
Carelink Summary Report / Loop Recorder 

## 2022-06-23 DIAGNOSIS — N289 Disorder of kidney and ureter, unspecified: Secondary | ICD-10-CM | POA: Diagnosis not present

## 2022-06-23 DIAGNOSIS — I10 Essential (primary) hypertension: Secondary | ICD-10-CM | POA: Diagnosis not present

## 2022-06-23 DIAGNOSIS — R051 Acute cough: Secondary | ICD-10-CM | POA: Diagnosis not present

## 2022-06-23 DIAGNOSIS — R2231 Localized swelling, mass and lump, right upper limb: Secondary | ICD-10-CM | POA: Diagnosis not present

## 2022-06-23 DIAGNOSIS — J479 Bronchiectasis, uncomplicated: Secondary | ICD-10-CM | POA: Diagnosis not present

## 2022-06-23 DIAGNOSIS — Z6825 Body mass index (BMI) 25.0-25.9, adult: Secondary | ICD-10-CM | POA: Diagnosis not present

## 2022-06-23 DIAGNOSIS — D649 Anemia, unspecified: Secondary | ICD-10-CM | POA: Diagnosis not present

## 2022-06-29 ENCOUNTER — Ambulatory Visit (HOSPITAL_COMMUNITY)
Admission: RE | Admit: 2022-06-29 | Discharge: 2022-06-29 | Disposition: A | Discharge status: Home / Self Care | Payer: Medicare Other | Source: Ambulatory Visit | Attending: Internal Medicine | Admitting: Internal Medicine

## 2022-06-29 ENCOUNTER — Encounter (HOSPITAL_COMMUNITY): Payer: Self-pay

## 2022-06-29 DIAGNOSIS — I2510 Atherosclerotic heart disease of native coronary artery without angina pectoris: Secondary | ICD-10-CM | POA: Diagnosis not present

## 2022-06-29 DIAGNOSIS — R918 Other nonspecific abnormal finding of lung field: Secondary | ICD-10-CM | POA: Diagnosis not present

## 2022-06-29 DIAGNOSIS — J479 Bronchiectasis, uncomplicated: Secondary | ICD-10-CM | POA: Insufficient documentation

## 2022-06-29 DIAGNOSIS — I700 Atherosclerosis of aorta: Secondary | ICD-10-CM | POA: Diagnosis not present

## 2022-07-03 NOTE — Progress Notes (Signed)
Called pt and there was no answer-LMTCB °

## 2022-07-06 ENCOUNTER — Other Ambulatory Visit: Payer: Self-pay | Admitting: Family Medicine

## 2022-07-06 DIAGNOSIS — R2231 Localized swelling, mass and lump, right upper limb: Secondary | ICD-10-CM

## 2022-07-06 NOTE — Progress Notes (Signed)
Tried calling pt and there was no answer and no option to leave msg, Will call back.

## 2022-07-07 ENCOUNTER — Telehealth: Payer: Self-pay | Admitting: Hematology and Oncology

## 2022-07-07 NOTE — Telephone Encounter (Signed)
Scheduled appt per 10/19 referral. Pt already established with Dr. Lindi Adie. Called pt, no answer. Left msg with appt date/time.

## 2022-07-17 NOTE — Progress Notes (Signed)
Patient Care Team: Kathyrn Lass, MD as PCP - General (Family Medicine) Leonie Man, MD as PCP - Cardiology (Cardiology) Erroll Luna, MD as Consulting Physician (General Surgery) Kyung Rudd, MD as Consulting Physician (Radiation Oncology) Gordy Levan, MD as Consulting Physician (Oncology) Sylvan Cheese, NP as Nurse Practitioner (Hematology and Oncology)  DIAGNOSIS:  Encounter Diagnosis  Name Primary?   Malignant neoplasm of lower-inner quadrant of right breast of female, estrogen receptor positive ([place]) Yes    SUMMARY OF ONCOLOGIC HISTORY: Oncology History Overview Note  3/3/Multifocal T1N0 left breast at lumpectomy and sentinel node evaluation March 2010. ER PR +, Her2 negative. 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 Intolerant to Femara and Aromasin, Tamoxifen   08/31/2014 Procedure   Genetic testing: Mutation at [facility], [id number]   01/26/2015 Breast MRI   There is a 5 x 9 x 8 mm mass enhancement at the slight lower slight medial right breast anterior to middle depth with plateau enhancement kinetics.   02/09/2015 Initial Biopsy   Right breast core needle bx: negative for malignancy    02/24/2015 Procedure   Right breast core needle bx: DCIS with comedonecrosis, ER+ (95%), PR- (0%)   02/24/2015 Clinical Stage   Stage 0: Tis N0   04/07/2015 Definitive Surgery   Right lumpectomy: DCIS, intermediate grade, with necrosis; close lateral margin   04/07/2015 Pathologic Stage   Stage 0: pTis pNx    05/17/2015 - 06/14/2015 Radiation Therapy   Adjuvant RT([place]): Right breast 42.5 Gy over 17 fractions; right breast boost 7.5 Gy over 3 fractions. Total dose: 50 Gy   09/02/2015 Survivorship   Survivorship care plan completed and mailed to patient in lieu of in person visit.     CHIEF COMPLIANT: Follow-up of recurrent breast cancer on tamoxifen    INTERVAL HISTORY: Monique Watson is a 81 y.o. 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 She presents to the clinic today for a follow-up.   ALLERGIES:  is allergic to chlorhexidine, arimidex [anastrozole], diovan [valsartan], and uncoded nonscreenable allergen.  MEDICATIONS:  Current Outpatient Medications  Medication Sig Dispense Refill   albuterol (VENTOLIN HFA) 108 (90 Base) MCG/ACT inhaler 2 puffs as needed Inhalation every 4 hrs for 30 days     amLODipine (NORVASC) 5 MG tablet Take 5 mg by mouth daily.     aspirin 81 MG tablet Take 81 mg by mouth daily.     azithromycin (ZITHROMAX) 250 MG tablet Take 2 on day one then 1 daily x 4 days 6 tablet 11   Calcium Carbonate-Vit D-Min (CALCIUM 1200) 1200-1000 MG-UNIT CHEW 1 tablet with a meal     CALCIUM PO Take 1 tablet by mouth daily.     Cholecalciferol (VITAMIN D3) 2000 UNITS TABS Take 2,000 Units by mouth daily.      Cholecalciferol 50 MCG (2000 UT) CAPS 1 capsule     docusate sodium (COLACE) 100 MG capsule 1 capsule daily for constipation     losartan (COZAAR)  100 MG tablet Take 1 tablet by mouth daily.     Omega-3 Fatty Acids (FISH OIL CONCENTRATE) 1000 MG CAPS Take 1,000 mg by mouth daily.      potassium chloride (K-DUR,KLOR-CON) 10 MEQ tablet Take 10 mEq by mouth daily.     Probiotic Product (PROBIOTIC PO) Take 1 capsule by mouth daily.     tamoxifen (NOLVADEX) 20 MG tablet Take 1 tablet (20 mg total) by mouth daily. 90 tablet 3   vitamin E 180 MG (400 UNITS) capsule Take 400 Units by mouth  daily.      No current facility-administered medications for this visit.    PHYSICAL EXAMINATION: ECOG PERFORMANCE STATUS: 1 - Symptomatic but completely ambulatory  Vitals:   07/24/22 1356  BP: 135/64  Pulse: 72  Resp: 18  Temp: (!) 97.5 F (36.4 C)  SpO2: 100%   Filed Weights   07/24/22 1356  Weight: 154 lb 8 oz (70.1 kg)      LABORATORY DATA:  I have reviewed the data as listed    Latest Ref Rng & Units 02/17/2019   11:36 AM 09/27/2018    7:38 PM 09/27/2018   12:53 PM  CMP  Glucose 65 - 99 mg/dL 87   141   BUN 8 - 27 mg/dL 11   12   Creatinine 0.57 - 1.00 mg/dL 0.90  0.88  0.70   Sodium 134 - 144 mmol/L 145   142   Potassium 3.5 - 5.2 mmol/L 4.0   3.1   Chloride 96 - 106 mmol/L 105   109   CO2 20 - 29 mmol/L 23     Calcium 8.7 - 10.3 mg/dL 9.0       Lab Results  Component Value Date   WBC 5.4 02/17/2019   HGB 13.2 02/17/2019   HCT 39.7 02/17/2019   MCV 99 (H) 02/17/2019   PLT 100 (LL) 02/17/2019   NEUTROABS 3.6 09/27/2018    ASSESSMENT & PLAN:  Breast cancer of lower-inner quadrant of right female breast 03/2015 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   Breast cancer surveillance: 1.  Breast MRI 06/01/2021: Benign breast density category B 2. mammogram at [facility]: 05/24/2022.  Benign, breast density category B 3.  Breast exam 07/24/2022: Benign    CT chest: 07/01/2022: chronic MAI stigmata   Polyclonal IgG elevation: I discussed with the patient that after reviewing her serum protein electrophoresis there is no concern for a monoclonal process since the M protein was not detected.  Elevation of IgG is result of immune activation.  It does not indicate any pathologic process.  Return to clinic in 1 year for follow-up    No  orders of the defined types were placed in this encounter.  The patient has a good understanding of the overall plan. she agrees with it. she will call with any problems that may develop before the next visit here. Total time spent: 30 mins including face to face time and time spent for planning, charting and co-ordination of care   Harriette Ohara, MD 07/24/22    I Gardiner Coins am scribing for Dr. Lindi Adie  I have reviewed the above documentation for accuracy and completeness, and I agree with the above.

## 2022-07-24 ENCOUNTER — Other Ambulatory Visit: Payer: Self-pay

## 2022-07-24 ENCOUNTER — Inpatient Hospital Stay: Payer: Medicare Other | Attending: Hematology and Oncology | Admitting: Hematology and Oncology

## 2022-07-24 VITALS — BP 135/64 | HR 72 | Temp 97.5°F | Resp 18 | Ht 64.0 in | Wt 154.5 lb

## 2022-07-24 DIAGNOSIS — Z8543 Personal history of malignant neoplasm of ovary: Secondary | ICD-10-CM | POA: Diagnosis not present

## 2022-07-24 DIAGNOSIS — Z79899 Other long term (current) drug therapy: Secondary | ICD-10-CM | POA: Diagnosis not present

## 2022-07-24 DIAGNOSIS — Z7982 Long term (current) use of aspirin: Secondary | ICD-10-CM | POA: Diagnosis not present

## 2022-07-24 DIAGNOSIS — C50311 Malignant neoplasm of lower-inner quadrant of right female breast: Secondary | ICD-10-CM | POA: Diagnosis not present

## 2022-07-24 DIAGNOSIS — Z170 Estrogen receptor positive status [ER+]: Secondary | ICD-10-CM | POA: Diagnosis not present

## 2022-07-24 DIAGNOSIS — Z923 Personal history of irradiation: Secondary | ICD-10-CM | POA: Insufficient documentation

## 2022-07-24 DIAGNOSIS — Z79810 Long term (current) use of selective estrogen receptor modulators (SERMs): Secondary | ICD-10-CM | POA: Diagnosis not present

## 2022-07-24 NOTE — Progress Notes (Signed)
Spoke with pt and notified of results per Dr. Wert. Pt verbalized understanding and denied any questions. 

## 2022-07-24 NOTE — Assessment & Plan Note (Signed)
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   Breast cancer surveillance: 1.  Breast MRI 06/01/2021: Benign breast density category B 2. mammogram at [facility]: 05/24/2022.  Benign, breast density category B 3.  Breast exam 07/24/2022: Benign    CT chest: 07/01/2022: chronic MAI stigmata   Return to clinic in 1 year for follow-up

## 2022-08-04 DIAGNOSIS — M7989 Other specified soft tissue disorders: Secondary | ICD-10-CM | POA: Diagnosis not present

## 2022-08-04 DIAGNOSIS — R2231 Localized swelling, mass and lump, right upper limb: Secondary | ICD-10-CM | POA: Diagnosis not present

## 2022-08-21 ENCOUNTER — Other Ambulatory Visit: Payer: Self-pay | Admitting: Family Medicine

## 2022-08-21 DIAGNOSIS — R2231 Localized swelling, mass and lump, right upper limb: Secondary | ICD-10-CM

## 2022-09-23 ENCOUNTER — Ambulatory Visit
Admission: RE | Admit: 2022-09-23 | Discharge: 2022-09-23 | Disposition: A | Discharge status: Home / Self Care | Payer: Medicare Other | Source: Ambulatory Visit | Attending: Family Medicine | Admitting: Family Medicine

## 2022-09-23 DIAGNOSIS — R2231 Localized swelling, mass and lump, right upper limb: Secondary | ICD-10-CM | POA: Diagnosis not present

## 2022-09-23 MED ORDER — GADOPICLENOL 0.5 MMOL/ML IV SOLN
7.0000 mL | Freq: Once | INTRAVENOUS | Status: AC | PRN
  Administered 2022-09-23: 7 mL via INTRAVENOUS

## 2022-10-19 DIAGNOSIS — Z888 Allergy status to other drugs, medicaments and biological substances status: Secondary | ICD-10-CM | POA: Diagnosis not present

## 2022-10-19 DIAGNOSIS — M47812 Spondylosis without myelopathy or radiculopathy, cervical region: Secondary | ICD-10-CM | POA: Diagnosis not present

## 2022-10-19 DIAGNOSIS — R29898 Other symptoms and signs involving the musculoskeletal system: Secondary | ICD-10-CM | POA: Diagnosis not present

## 2022-10-19 DIAGNOSIS — R2231 Localized swelling, mass and lump, right upper limb: Secondary | ICD-10-CM | POA: Diagnosis not present

## 2022-10-19 DIAGNOSIS — Z883 Allergy status to other anti-infective agents status: Secondary | ICD-10-CM | POA: Diagnosis not present

## 2022-10-19 DIAGNOSIS — M4312 Spondylolisthesis, cervical region: Secondary | ICD-10-CM | POA: Diagnosis not present

## 2022-10-19 DIAGNOSIS — M542 Cervicalgia: Secondary | ICD-10-CM | POA: Diagnosis not present

## 2022-11-16 DIAGNOSIS — U071 COVID-19: Secondary | ICD-10-CM | POA: Diagnosis not present

## 2022-12-14 DIAGNOSIS — D1721 Benign lipomatous neoplasm of skin and subcutaneous tissue of right arm: Secondary | ICD-10-CM | POA: Diagnosis not present

## 2022-12-14 DIAGNOSIS — D2111 Benign neoplasm of connective and other soft tissue of right upper limb, including shoulder: Secondary | ICD-10-CM | POA: Diagnosis not present

## 2022-12-14 DIAGNOSIS — R2231 Localized swelling, mass and lump, right upper limb: Secondary | ICD-10-CM | POA: Diagnosis not present

## 2022-12-15 DIAGNOSIS — Z01810 Encounter for preprocedural cardiovascular examination: Secondary | ICD-10-CM | POA: Diagnosis not present

## 2023-01-04 DIAGNOSIS — M7989 Other specified soft tissue disorders: Secondary | ICD-10-CM | POA: Diagnosis not present

## 2023-02-08 DIAGNOSIS — R2231 Localized swelling, mass and lump, right upper limb: Secondary | ICD-10-CM | POA: Diagnosis not present

## 2023-03-29 ENCOUNTER — Inpatient Hospital Stay: Payer: Medicare Other | Attending: Hematology and Oncology | Admitting: Hematology and Oncology

## 2023-03-29 NOTE — Assessment & Plan Note (Deleted)
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 I discussed the pros and cons of continuing versus discontinuing tamoxifen therapy and recommended stopping it at this time.   Breast cancer surveillance: 1.  Breast MRI 06/01/2021: Benign breast density category B 2. mammogram at [facility]: 05/24/2022.  Benign, breast density category B 3.  Breast exam 03/29/2023: Benign    CT chest: 07/01/2022: chronic MAI stigmata   Return to clinic in 1 year for follow-up

## 2023-05-24 DIAGNOSIS — J479 Bronchiectasis, uncomplicated: Secondary | ICD-10-CM | POA: Diagnosis not present

## 2023-05-24 DIAGNOSIS — I700 Atherosclerosis of aorta: Secondary | ICD-10-CM | POA: Diagnosis not present

## 2023-05-24 DIAGNOSIS — I10 Essential (primary) hypertension: Secondary | ICD-10-CM | POA: Diagnosis not present

## 2023-05-24 DIAGNOSIS — E559 Vitamin D deficiency, unspecified: Secondary | ICD-10-CM | POA: Diagnosis not present

## 2023-05-24 DIAGNOSIS — Z853 Personal history of malignant neoplasm of breast: Secondary | ICD-10-CM | POA: Diagnosis not present

## 2023-05-24 DIAGNOSIS — Z8673 Personal history of transient ischemic attack (TIA), and cerebral infarction without residual deficits: Secondary | ICD-10-CM | POA: Diagnosis not present

## 2023-05-24 DIAGNOSIS — Z6825 Body mass index (BMI) 25.0-25.9, adult: Secondary | ICD-10-CM | POA: Diagnosis not present

## 2023-05-24 DIAGNOSIS — N289 Disorder of kidney and ureter, unspecified: Secondary | ICD-10-CM | POA: Diagnosis not present

## 2023-05-24 DIAGNOSIS — D649 Anemia, unspecified: Secondary | ICD-10-CM | POA: Diagnosis not present

## 2023-05-24 DIAGNOSIS — E876 Hypokalemia: Secondary | ICD-10-CM | POA: Diagnosis not present

## 2023-05-30 DIAGNOSIS — Z1231 Encounter for screening mammogram for malignant neoplasm of breast: Secondary | ICD-10-CM | POA: Diagnosis not present

## 2023-06-02 DIAGNOSIS — Z23 Encounter for immunization: Secondary | ICD-10-CM | POA: Diagnosis not present

## 2023-06-05 ENCOUNTER — Encounter: Payer: Self-pay | Admitting: Hematology and Oncology

## 2023-06-13 ENCOUNTER — Other Ambulatory Visit: Payer: Self-pay | Admitting: Hematology and Oncology

## 2023-07-20 ENCOUNTER — Encounter: Payer: Self-pay | Admitting: Internal Medicine

## 2023-07-20 ENCOUNTER — Ambulatory Visit: Payer: Medicare Other

## 2023-07-20 ENCOUNTER — Ambulatory Visit (INDEPENDENT_AMBULATORY_CARE_PROVIDER_SITE_OTHER): Payer: Medicare Other | Admitting: Internal Medicine

## 2023-07-20 VITALS — BP 136/72 | HR 72 | Temp 98.7°F | Ht 66.0 in | Wt 155.0 lb

## 2023-07-20 DIAGNOSIS — I700 Atherosclerosis of aorta: Secondary | ICD-10-CM | POA: Diagnosis not present

## 2023-07-20 DIAGNOSIS — J479 Bronchiectasis, uncomplicated: Secondary | ICD-10-CM | POA: Diagnosis not present

## 2023-07-20 DIAGNOSIS — R059 Cough, unspecified: Secondary | ICD-10-CM | POA: Diagnosis not present

## 2023-07-20 MED ORDER — AZITHROMYCIN 250 MG PO TABS: ORAL_TABLET | ORAL | 11 refills | Status: DC

## 2023-07-20 NOTE — Progress Notes (Signed)
Subjective:     Patient ID: Monique Watson, female   DOB: 1941-08-04,    MRN: [id number]  Brief patient profile:  82 yobf quit smoking 11/ 2018 referred to pulmonary clinic 10/22/2017 by Dr   Zachery Dauer for eval of bronchiectasis with nl pfts 01/21/2018    History of Present Illness  10/22/2017 1st [facility] office visit/ Anes Rigel   Chief Complaint  Patient presents with   Pulmonary Consult    Referred by Dr. Juluis Rainier. Pt states had PNA back in Nov 2018. She is coughing with yellow sputum. She uses her albuterol inhaler at least once per day on average.   baseline pna "every other year"  x 10-15 years and fine in between so fine in fact that she could ex at gym and no need for inhalers Has not returned  to gym p most recent dx of pna in Nov 2018  Uses saba each helps bring up  Maybe a tbsp beige more than yellow and never bloody / no better with a zpak Sleeps flat ok  Doe = MMRC1 = can walk nl pace, flat grade, can't hurry or go uphills or steps s sob   rec Bronchiectasis    Work on inhaler technique:   Use the flutter valve as much as you can to help you bring up mucus      05/20/2020  f/u ov/Reuben Knoblock re: bronchiectasis  Chief Complaint  Patient presents with   Follow-up    PFT done today. Breathing is overall doing well. Her cough is about the same- prod with yellow sputum.   Dyspnea:  Not limited by breathing from desired activities   Cough: not really a bother at present   Sleeping: ok bed is flat, one pillow  SABA use: not in a year  02: none  Rec 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 of your bronchial tubes  s Whenever you develop cough congestion take mucinex or mucinex dm > these will help keep the mucus loose and flowing but if your condition worsens you need to seek help immediately preferably here or somewhere inside the [facility] system to compare xrays ( worse = darker or bloody mucus or pain on breathing in)   If the mucus gets nasty >  zpak    06/01/2021  f/u ov/Shain Pauwels re:  bronchiectasis  maint on prn zpak   Chief Complaint  Patient presents with   Follow-up    Patient reports she is here for a follow up and no concerns,   Dyspnea:  not limited/ outdoor walking ok including hills Cough: no am flares Sleeping: bed is flat one pillow SABA use: none  02: no Covid status:   vax x 3  Rec  Whenever you develop cough congestion take mucinex or mucinex dm 1200 mg every 12 hours as needed > these will help keep the mucus loose and flowing but if your condition worsens you need to seek help immediately preferably here or somewhere inside the [facility] system to compare xrays ( worse = darker or bloody mucus or pain on breathing in)   If the mucus gets nasty >  zpak  Late add:  cxr worse > hrct ordered  > not done as of 06/19/2022     06/19/2022  f/u ov/Jaythan Hinely re: bronchiectasis no maint rx  Chief Complaint  Patient presents with   Follow-up    She c/o fatigue and dizziness over the past wk. She states sometimes she has "cramps all over".  Dyspnea:  not sob but very sedentary   Cough: rattling / thick creamy mucus in am never bloody, not sued zmax recently  Sleeping: feels fine flat s veritgo/ one pillow SABA use: none  02: none  L arm sts just distal to volar elbow with from L elbow and no swelling distally  Light head  x 1  week - says working on better bp rx with PCP with f/u this afternoon CT due now  Rec  Whenever you develop cough congestion take mucinex or mucinex dm 1200 mg every 12 hours as needed  If the mucus gets nasty >  zpak  HRCT chest  06/29/22  C/w bronchiectasis and low grade MAI    07/20/2023 12 m f/u ov/Huber Mathers re: bronchiectasis  maint on no resp rx/ has zpak not using   Chief Complaint  Patient presents with   Follow-up    Doing well.  Dyspnea:  does ex Tues /Thursday silver sneakers x 45 min Cough: slt rattle slt yellow no recent abx ,no recent change  Sleeping: flat bed / one pillow s  resp cc  SABA use: none 02: none     No obvious  day to day or daytime variability or assoc  mucus plugs or hemoptysis or cp or chest tightness, subjective wheeze or overt sinus or hb symptoms.    Also denies any obvious fluctuation of symptoms with weather or environmental changes or other aggravating or alleviating factors except as outlined above   No unusual exposure hx or h/o childhood pna/ asthma or knowledge of premature birth.  Current Allergies, Complete Past Medical History, Past Surgical History, Family History, and Social History were reviewed in [employer] record.  ROS  The following are not active complaints unless bolded Hoarseness, sore throat, dysphagia, dental problems, itching, sneezing,  nasal congestion or discharge of excess mucus or purulent secretions, ear ache,   fever, chills, sweats, unintended wt loss or wt gain, classically pleuritic or exertional cp,  orthopnea pnd or arm/hand swelling  or leg swelling, presyncope, palpitations, abdominal pain, anorexia, nausea, vomiting, diarrhea  or change in bowel habits or change in bladder habits, change in stools or change in urine, dysuria, hematuria,  rash, arthralgias, visual complaints, headache, numbness, weakness or ataxia or problems with walking or coordination,  change in mood or  memory.        Current Meds  Medication Sig   albuterol (VENTOLIN HFA) 108 (90 Base) MCG/ACT inhaler 2 puffs as needed Inhalation every 4 hrs for 30 days   amLODipine (NORVASC) 5 MG tablet Take 5 mg by mouth daily.   aspirin 81 MG tablet Take 81 mg by mouth daily.   azithromycin (ZITHROMAX) 250 MG tablet Take 2 on day one then 1 daily x 4 days   Calcium Carbonate-Vit D-Min (CALCIUM 1200) 1200-1000 MG-UNIT CHEW 1 tablet with a meal   CALCIUM PO Take 1 tablet by mouth daily.   Cholecalciferol (VITAMIN D3) 2000 UNITS TABS Take 2,000 Units by mouth daily.    Cholecalciferol 50 MCG (2000 UT) CAPS 1 capsule   docusate sodium (COLACE) 100 MG capsule 1 capsule daily for  constipation   losartan (COZAAR) 100 MG tablet Take 1 tablet by mouth daily.   Omega-3 Fatty Acids (FISH OIL CONCENTRATE) 1000 MG CAPS Take 1,000 mg by mouth daily.    potassium chloride (K-DUR,KLOR-CON) 10 MEQ tablet Take 10 mEq by mouth daily.   Probiotic Product (PROBIOTIC PO) Take 1 capsule by mouth daily.  tamoxifen (NOLVADEX) 20 MG tablet TAKE 1 TABLET BY MOUTH EVERY DAY   vitamin E 180 MG (400 UNITS) capsule Take 400 Units by mouth daily.                Objective:   Physical Exam   Wts  07/20/2023       155  06/19/2022       155  06/01/2021       178  05/20/2020         180   01/21/2018         178   10/22/17 181 lb (82.1 kg)  03/06/17 169 lb (76.7 kg)  01/16/17 174 lb 6.4 oz (79.1 kg)     Vital signs reviewed  07/20/2023  - Note at rest 02 sats  100% on RA   General appearance:    amb pleasant bf mild rattling cough      HEENT : Oropharynx  clear      Nasal turbinates nl    NECK :  without  apparent JVD/ palpable Nodes/TM    LUNGS: no acc muscle use,  Nl contour chest with minimal insp /exp rhonchi bilaterally    CV:  RRR  no s3 or murmur or increase in P2, and no edema   ABD:  soft and nontender with nl inspiratory excursion in the supine position. No bruits or organomegaly appreciated   MS:  Nl gait/ ext warm without deformities Or obvious joint restrictions  calf tenderness, cyanosis or clubbing    SKIN: warm and dry without lesions    NEURO:  alert, approp, nl sensorium with  no motor or cerebellar deficits apparent.      CXR PA and Lateral:   07/20/2023 :    I personally reviewed images and impression is as follows:    mild non-specific increase markings/ no acute changes        Assessment:

## 2023-07-20 NOTE — Assessment & Plan Note (Addendum)
See CT chest 08/17/17  - Spirometry 10/22/2017  wnl s am saba but active exp  rhonchi on exam  - Quant Ig's wnl  - Allergy profile 10/22/2017 >  Eos 0.1 /  IgE  45 RAST neg  - Quant TB 10/22/2017  Neg - Flutter valve 10/22/17  - PFT's  01/21/2018  FEV1 1.44 (90 % ) ratio 88  p no % improvement from saba p nothing prior to study with DLCO  60/62 % corrects to 89 % for alv volume   - PFT's  05/20/2020  FEV1 1.33 (86 % ) ratio 0.79  p 1 % improvement from saba p 0 prior to study with DLCO  18.73 (94%) corrects to 4.10 (139%)  for alv volume and FV curve min/mild concavity - 06/01/2021 cxr worse > HRCT rec  Not done  - HRCT chest  06/29/22  C/w bronchiectasis and low grade MAI  - 07/20/2023 Zpak prn flares     No clinical or radiographic evidence for progression so rec no change in RX to include zpak prn flare  F/u yearly, sooner prn   Each maintenance medication was reviewed in detail including emphasizing most importantly the difference between maintenance and prns and under what circumstances the prns are to be triggered using an action plan format where appropriate.  Total time for H and P, chart review, counseling,   and generating customized AVS unique to this office visit / same day charting = 25 min

## 2023-07-20 NOTE — Patient Instructions (Signed)
For cough/ congestion > mucinex or mucinex dm  up to maximum of  1200 mg every 12 hours as needed  If mucus gets nasty > zpak refillable  Please remember to go to the  x-ray department  for your tests - we will call you with the results when they are available     Please schedule a follow up visit in 12 months but call sooner if needed

## 2023-07-27 ENCOUNTER — Other Ambulatory Visit: Payer: Self-pay | Admitting: Hematology and Oncology

## 2023-07-30 ENCOUNTER — Inpatient Hospital Stay: Payer: Medicare Other | Attending: Hematology and Oncology | Admitting: Hematology and Oncology

## 2023-07-30 VITALS — BP 144/61 | HR 71 | Temp 97.9°F | Resp 18 | Ht 66.0 in | Wt 151.2 lb

## 2023-07-30 DIAGNOSIS — C50311 Malignant neoplasm of lower-inner quadrant of right female breast: Secondary | ICD-10-CM | POA: Diagnosis not present

## 2023-07-30 DIAGNOSIS — Z170 Estrogen receptor positive status [ER+]: Secondary | ICD-10-CM

## 2023-07-30 DIAGNOSIS — Z79810 Long term (current) use of selective estrogen receptor modulators (SERMs): Secondary | ICD-10-CM | POA: Insufficient documentation

## 2023-07-30 DIAGNOSIS — R768 Other specified abnormal immunological findings in serum: Secondary | ICD-10-CM | POA: Diagnosis not present

## 2023-07-30 DIAGNOSIS — Z79899 Other long term (current) drug therapy: Secondary | ICD-10-CM | POA: Insufficient documentation

## 2023-07-30 DIAGNOSIS — Z7982 Long term (current) use of aspirin: Secondary | ICD-10-CM | POA: Insufficient documentation

## 2023-07-30 MED ORDER — TAMOXIFEN CITRATE 20 MG PO TABS: 20.0000 mg | ORAL_TABLET | Freq: Every day | ORAL | 3 refills | Status: DC

## 2023-07-30 NOTE — Assessment & Plan Note (Addendum)
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 Plan to continue for 2 more years.   Breast cancer surveillance: 1.  Breast MRI 06/01/2021: Benign breast density category B 2. mammogram at [facility]: 05/30/2023.  Benign, breast density category B 3.  Breast exam 07/30/2023: Benign    CT chest: 07/01/2022: chronic MAI stigmata   Polyclonal IgG elevation: Benign process.   Return to clinic in 1 year for follow-up

## 2023-07-30 NOTE — Progress Notes (Signed)
Patient Care Team: Sigmund Hazel, MD as PCP - General (Family Medicine) Marykay Lex, MD as PCP - Cardiology (Cardiology) Harriette Bouillon, MD as Consulting Physician (General Surgery) Dorothy Puffer, MD as Consulting Physician (Radiation Oncology) Reece Packer, MD as Consulting Physician (Oncology) Salomon Fick, NP as Nurse Practitioner (Hematology and Oncology)  DIAGNOSIS:  Encounter Diagnosis  Name Primary?   Malignant neoplasm of lower-inner quadrant of right breast of female, estrogen receptor positive (HCC) Yes    SUMMARY OF ONCOLOGIC HISTORY: Oncology History Overview Note  3/3/Multifocal T1N0 left breast at lumpectomy and sentinel node evaluation March 2010. ER PR +, Her2 negative. 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 Intolerant to Femara and Aromasin, Tamoxifen   08/31/2014 Procedure   Genetic testing: Mutation at [facility], [id number]   01/26/2015 Breast MRI   There is a 5 x 9 x 8 mm mass enhancement at the slight lower slight medial right breast anterior to middle depth with plateau enhancement kinetics.   02/09/2015 Initial Biopsy   Right breast core needle bx: negative for malignancy    02/24/2015 Procedure   Right breast core needle bx: DCIS with comedonecrosis, ER+ (95%), PR- (0%)   02/24/2015 Clinical Stage   Stage 0: Tis N0   04/07/2015 Definitive Surgery   Right lumpectomy: DCIS, intermediate grade, with necrosis; close lateral margin   04/07/2015 Pathologic Stage   Stage 0: pTis pNx    05/17/2015 - 06/14/2015 Radiation Therapy   Adjuvant RT([facility]): Right breast 42.5 Gy over 17 fractions; right breast boost 7.5 Gy over 3 fractions. Total dose: 50 Gy   09/02/2015 Survivorship   Survivorship care plan completed and mailed to patient in lieu of in person visit.     CHIEF COMPLIANT: Surveillance of breast cancer  HISTORY OF PRESENT ILLNESS:   History of Present Illness   The patient, with a history of breast cancer in remission since 2016, presents for a routine follow-up. She has been on tamoxifen for the past eight years without any issues and plans to continue the medication for a couple more years. She recently completed a course of azithromycin for a bad cold, testing negative for COVID-19. She has been keeping up with regular breast checks, with her last mammogram in September showing good results.       ALLERGIES:  is allergic to chlorhexidine, arimidex [anastrozole], diovan [valsartan], and uncoded nonscreenable allergen.  MEDICATIONS:  Current Outpatient Medications  Medication Sig Dispense Refill   albuterol (VENTOLIN HFA) 108 (90 Base) MCG/ACT inhaler 2 puffs as needed Inhalation every 4 hrs for 30 days     amLODipine (NORVASC) 5 MG tablet Take 5 mg by mouth daily.     aspirin 81 MG tablet Take 81 mg by mouth daily.     Calcium Carbonate-Vit D-Min (CALCIUM 1200) 1200-1000 MG-UNIT CHEW 1 tablet with a meal     CALCIUM PO Take 1 tablet by mouth daily.     Cholecalciferol (VITAMIN D3) 2000 UNITS TABS Take 2,000 Units by mouth daily.      Cholecalciferol  50 MCG (2000 UT) CAPS 1 capsule     docusate sodium (COLACE) 100 MG capsule 1 capsule daily for constipation     losartan (COZAAR) 100 MG tablet Take 1 tablet by mouth daily.     Omega-3 Fatty Acids (FISH OIL CONCENTRATE) 1000 MG CAPS Take 1,000 mg by mouth daily.      potassium chloride (K-DUR,KLOR-CON) 10 MEQ tablet Take 10 mEq by mouth daily.     Probiotic Product (PROBIOTIC PO) Take 1 capsule by mouth daily.      tamoxifen (NOLVADEX) 20 MG tablet Take 1 tablet (20 mg total) by mouth daily. 90 tablet 3   vitamin E 180 MG (400 UNITS) capsule Take 400 Units by mouth daily.      No current facility-administered medications for this visit.    PHYSICAL EXAMINATION: ECOG PERFORMANCE STATUS: 1 - Symptomatic but completely ambulatory  Vitals:   07/30/23 1439  BP: (!) 144/61  Pulse: 71  Resp: 18  Temp: 97.9 F (36.6 C)  SpO2: 100%   Filed Weights   07/30/23 1439  Weight: 151 lb 3.2 oz (68.6 kg)      LABORATORY DATA:  I have reviewed the data as listed    Latest Ref Rng & Units 02/17/2019   11:36 AM 09/27/2018    7:38 PM 09/27/2018   12:53 PM  CMP  Glucose 65 - 99 mg/dL 87   604   BUN 8 - 27 mg/dL 11   12   Creatinine 5.40 - 1.00 mg/dL 9.81  1.91  4.78   Sodium 134 - 144 mmol/L 145   142   Potassium 3.5 - 5.2 mmol/L 4.0   3.1   Chloride 96 - 106 mmol/L 105   109   CO2 20 - 29 mmol/L 23     Calcium 8.7 - 10.3 mg/dL 9.0       Lab Results  Component Value Date   WBC 5.4 02/17/2019   HGB 13.2 02/17/2019   HCT 39.7 02/17/2019   MCV 99 (H) 02/17/2019   PLT 100 (LL) 02/17/2019   NEUTROABS 3.6 09/27/2018    ASSESSMENT & PLAN:  Breast cancer of lower-inner quadrant of right female breast 03/2015 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 Plan to continue for 2 more years.   Breast cancer surveillance: 1.  Breast MRI 06/01/2021: Benign breast density category B 2. mammogram at [facility]: 05/30/2023.  Benign, breast density category B 3.  Breast exam 07/30/2023: Benign    CT chest: 07/01/2022: chronic MAI stigmata   Polyclonal IgG elevation: Benign process.    Breast Cancer in Remission Patient has been on Tamoxifen for 8 years with good tolerance and no  recurrence of disease. Discussed plan to continue Tamoxifen for a total of 10 years. -Continue Tamoxifen, now with home delivery through [facility] to reduce cost for patient. Return to clinic in 1 year for follow-up  No orders of the defined types were placed in this encounter.  The patient has a good understanding of the overall plan. she agrees with it. she will call with any problems that may develop before the next visit here. Total time spent: 30 mins including face to face time and time spent for planning, charting and co-ordination of care   Tamsen Meek,  MD 07/30/23

## 2023-08-30 ENCOUNTER — Ambulatory Visit: Payer: Medicare Other | Attending: Cardiovascular Disease | Admitting: Cardiovascular Disease

## 2023-08-30 ENCOUNTER — Encounter: Payer: Self-pay | Admitting: Cardiovascular Disease

## 2023-08-30 VITALS — BP 160/78 | HR 67 | Ht 66.0 in | Wt 152.6 lb

## 2023-08-30 DIAGNOSIS — E7800 Pure hypercholesterolemia, unspecified: Secondary | ICD-10-CM | POA: Diagnosis not present

## 2023-08-30 DIAGNOSIS — G459 Transient cerebral ischemic attack, unspecified: Secondary | ICD-10-CM

## 2023-08-30 DIAGNOSIS — Z4509 Encounter for adjustment and management of other cardiac device: Secondary | ICD-10-CM | POA: Diagnosis not present

## 2023-08-30 DIAGNOSIS — I10 Essential (primary) hypertension: Secondary | ICD-10-CM | POA: Diagnosis not present

## 2023-08-30 DIAGNOSIS — I700 Atherosclerosis of aorta: Secondary | ICD-10-CM

## 2023-08-30 NOTE — Progress Notes (Signed)
Cardiology Office Note:    Date:  08/30/2023   ID:  Monique Watson, DOB [birth year], MRN [id number]  PCP:  Sigmund Hazel, MD  Cardiologist:  Bryan Lemma, MD  Electrophysiologist:  None   Referring MD: Inez Pilgrim, NP   No chief complaint on file.   History of Present Illness:    Monique Watson is a 82 y.o. female with a hx of transient ischemic attack, aortic atherosclerosis, history of smoking (quit January 2020), no evidence of cardioembolic source by TEE and no evidence of atrial fibrillation on 3 years of implantable loop recorder monitoring, hypertension, hypercholesterolemia  returning for follow-up and to discuss loop recorder at end of service.  The patient specifically denies any chest pain at rest or with exertion, dyspnea at rest or with exertion, orthopnea, paroxysmal nocturnal dyspnea, syncope, palpitations, focal neurological deficits, intermittent claudication, lower extremity edema, unexplained weight gain, cough, hemoptysis or wheezing.   Past Medical History:  Diagnosis Date   Anemia    Aortic atherosclerosis (HCC)    CT chest   Arthritis    Breast cancer (HCC) 09/29/2011   s/p radiation   Hypertension    Ovarian cancer (HCC) 09/29/2011   s/p chemo, radiation   Pneumonia    Radiation 01/14/2009-02/10/2009   left breast 45 Gy, boosted to 5 Gy   Seasonal allergies    Vitamin D deficiency     Past Surgical History:  Procedure Laterality Date   ABDOMINAL HYSTERECTOMY  2006   APPENDECTOMY     BLADDER SURGERY     BREAST LUMPECTOMY     BREAST LUMPECTOMY WITH RADIOACTIVE SEED LOCALIZATION Right 04/07/2015   Procedure: BREAST LUMPECTOMY WITH RADIOACTIVE SEED LOCALIZATION;  Surgeon: Harriette Bouillon, MD;  Location: [facility];  Service: General;  Laterality: Right;   BUBBLE STUDY  02/20/2019   Procedure: BUBBLE STUDY;  Surgeon: Thurmon Fair, MD;  Location: [facility];  Service: Cardiovascular;;   LOOP RECORDER INSERTION N/A 02/20/2019    Procedure: LOOP RECORDER INSERTION;  Surgeon: Thurmon Fair, MD;  Location: [facility];  Service: Cardiovascular;  Laterality: N/A;   PORT-A-CATH REMOVAL N/A 07/13/2015   Procedure: REMOVAL PORT-A-CATH;  Surgeon: Harriette Bouillon, MD;  Location: [facility];  Service: General;  Laterality: N/A;   TEE WITHOUT CARDIOVERSION N/A 02/20/2019   Procedure: TRANSESOPHAGEAL ECHOCARDIOGRAM (TEE) WITH IMPLANTABLE LOOP RECORDER;  Surgeon: Thurmon Fair, MD;  Location: [facility];  Service: Cardiovascular;  Laterality: N/A;   TONSILLECTOMY     TRANSTHORACIC ECHOCARDIOGRAM  09/2018   Poor quality study.  Mild LVH.  EF 55 to 60%.  No R WMA.  GR 1 DD.  Normal valves.    Current Medications: Current Meds  Medication Sig   amLODipine (NORVASC) 5 MG tablet Take 5 mg by mouth daily.   aspirin 81 MG tablet Take 81 mg by mouth daily.   CALCIUM PO Take 1 tablet by mouth daily.   Cholecalciferol (VITAMIN D3) 2000 UNITS TABS Take 2,000 Units by mouth daily.    losartan (COZAAR) 100 MG tablet Take 1 tablet by mouth daily.   Omega-3 Fatty Acids (FISH OIL CONCENTRATE) 1000 MG CAPS Take 1,000 mg by mouth daily.    potassium chloride (K-DUR,KLOR-CON) 10 MEQ tablet Take 10 mEq by mouth daily.   Probiotic Product (PROBIOTIC PO) Take 1 capsule by mouth daily.   tamoxifen (NOLVADEX) 20 MG tablet Take 1 tablet (20 mg total) by mouth daily.   vitamin E 180 MG (400 UNITS) capsule Take 400  Units by mouth daily.      Allergies:   Chlorhexidine, Arimidex [anastrozole], Diovan [valsartan], and Uncoded nonscreenable allergen   Social History   Socioeconomic History   Marital status: Married    Spouse name: Leonette Most   Number of children: 2   Years of education: 16   Highest education level: Not on file  Occupational History    Comment: retired  Tobacco Use   Smoking status: Former    Current packs/day: 0.00    Average packs/day: 1 pack/day for 58.0 years (58.0 ttl pk-yrs)    Types: Cigarettes     Start date: 07/20/1959    Quit date: 07/19/2017    Years since quitting: 6.1   Smokeless tobacco: Never  Substance and Sexual Activity   Alcohol use: Yes    Alcohol/week: 1.0 standard drink of alcohol    Types: 1 Shots of liquor per week    Comment: 11/29/15 cocktail on weekends   Drug use: No   Sexual activity: Yes    Birth control/protection: Post-menopausal  Other Topics Concern   Not on file  Social History Narrative   Lives with husband   Caffeine use- coffee 2 cups daily, sometimes tea   Social Drivers of Corporate investment banker Strain: Not on file  Food Insecurity: Not on file  Transportation Needs: Not on file  Physical Activity: Not on file  Stress: Not on file  Social Connections: Not on file     Family History: The patient's family history includes Breast cancer in her paternal aunt; Cancer in her cousin; Colon cancer in her paternal aunt; Colon cancer (age of onset: 50) in her brother; Ovarian cancer (age of onset: 76) in her mother; Pancreatitis (age of onset: 13) in her brother.  ROS:   Please see the history of present illness.     All other systems reviewed and are negative.  EKGs/Labs/Other Studies Reviewed:    The following studies were reviewed today: Comprehensive loop recorder download from just a few days ago  EKG:    EKG Interpretation Date/Time:  Thursday August 30 2023 10:01:13 EST Ventricular Rate:  67 PR Interval:  126 QRS Duration:  82 QT Interval:  416 QTC Calculation: 439 R Axis:   6  Text Interpretation: Normal sinus rhythm Possible Left atrial enlargement When compared with ECG of 27-Sep-2018 13:18, PREVIOUS ECG IS PRESENT Confirmed by Talicia Sui [phone]) on 08/30/2023 10:32:28 AM         Recent Labs: No results found for requested labs within last 365 days.  12/14/2022 hemoglobin 11.8, potassium 3.5, creatinine 1.12  Recent Lipid Panel 05/24/2023 cholesterol 132, HDL 77, triglycerides 63  Physical Exam:    VS:   BP (!) 160/78   Pulse 67   Ht 5\' 6"  (1.676 m)   Wt 152 lb 9.6 oz (69.2 kg)   SpO2 99%   BMI 24.63 kg/m     Wt Readings from Last 3 Encounters:  08/30/23 152 lb 9.6 oz (69.2 kg)  07/30/23 151 lb 3.2 oz (68.6 kg)  07/20/23 155 lb (70.3 kg)     GEN:  Well nourished, well developed in no acute distress HEENT: Normal NECK: No JVD; No carotid bruits LYMPHATICS: No lymphadenopathy CARDIAC: RRR, no murmurs, rubs, gallops RESPIRATORY:  Clear to auscultation without rales, wheezing or rhonchi  ABDOMEN: Soft, non-tender, non-distended MUSCULOSKELETAL:  No edema; No deformity  SKIN: Warm and dry NEUROLOGIC:  Alert and oriented x 3 PSYCHIATRIC:  Normal affect   HYPERTENSION CONTROL  Vitals:   08/30/23 0953 08/30/23 1033  BP: (!) 148/70 (!) 160/78    The patient's blood pressure is elevated above target today.  In order to address the patient's elevated BP: Blood pressure will be monitored at home to determine if medication changes need to be made.       ASSESSMENT:    1. Hypertension, unspecified type   2. Essential hypertension   3. TIA (transient ischemic attack)     PLAN:    In order of problems listed above:  TIA: No recurrent neurological complaints. ILR: Battery is depleted.  Discussed device removal.  She prefers to just leave it in place. Aortic atherosclerosis: Normal caliber aorta.  Excellent lipids.  On aspirin. HTN: Blood pressure is high today, but she reports not taking her medications yet.  Noticed that her blood pressure was 144/61 last month, a little high as well.  Asked her to send me a blood pressure log with her home monitor after a week. History of breast and ovarian cancer: Status post total hysterectomy and bilateral breast surgery, seeing Dr. Pamelia Hoit on a yearly basis.   Medication Adjustments/Labs and Tests Ordered: Current medicines are reviewed at length with the patient today.  Concerns regarding medicines are outlined above.  Orders Placed This  Encounter  Procedures   EKG 12-Lead   No orders of the defined types were placed in this encounter.   Patient Instructions  Medication Instructions:  No changes *If you need a refill on your cardiac medications before your next appointment, please call your pharmacy*  Testing/Procedures: Keep a BP log for 1 week   Follow-Up: At [facility], you and your health needs are our priority.  As part of our continuing mission to provide you with exceptional heart care, we have created designated Provider Care Teams.  These Care Teams include your primary Cardiologist (physician) and Advanced Practice Providers (APPs -  Physician Assistants and Nurse Practitioners) who all work together to provide you with the care you need, when you need it.  We recommend signing up for the patient portal called "MyChart".  Sign up information is provided on this After Visit Summary.  MyChart is used to connect with patients for Virtual Visits (Telemedicine).  Patients are able to view lab/test results, encounter notes, upcoming appointments, etc.  Non-urgent messages can be sent to your provider as well.   To learn more about what you can do with MyChart, go to [email or web address].    Your next appointment:   1 year(s)  Provider:   Dr Royann Shivers    Signed, Thurmon Fair, MD  08/30/2023 10:38 AM    [facility]

## 2023-08-30 NOTE — Patient Instructions (Signed)
Medication Instructions:  No changes *If you need a refill on your cardiac medications before your next appointment, please call your pharmacy*  Testing/Procedures: Keep a BP log for 1 week   Follow-Up: At [facility], you and your health needs are our priority.  As part of our continuing mission to provide you with exceptional heart care, we have created designated Provider Care Teams.  These Care Teams include your primary Cardiologist (physician) and Advanced Practice Providers (APPs -  Physician Assistants and Nurse Practitioners) who all work together to provide you with the care you need, when you need it.  We recommend signing up for the patient portal called "MyChart".  Sign up information is provided on this After Visit Summary.  MyChart is used to connect with patients for Virtual Visits (Telemedicine).  Patients are able to view lab/test results, encounter notes, upcoming appointments, etc.  Non-urgent messages can be sent to your provider as well.   To learn more about what you can do with MyChart, go to [email or web address].    Your next appointment:   1 year(s)  Provider:   Dr Royann Shivers

## 2023-09-06 DIAGNOSIS — G5601 Carpal tunnel syndrome, right upper limb: Secondary | ICD-10-CM | POA: Diagnosis not present

## 2023-11-22 DIAGNOSIS — Z6824 Body mass index (BMI) 24.0-24.9, adult: Secondary | ICD-10-CM | POA: Diagnosis not present

## 2023-11-22 DIAGNOSIS — N1832 Chronic kidney disease, stage 3b: Secondary | ICD-10-CM | POA: Diagnosis not present

## 2023-11-22 DIAGNOSIS — D649 Anemia, unspecified: Secondary | ICD-10-CM | POA: Diagnosis not present

## 2023-11-22 DIAGNOSIS — C50311 Malignant neoplasm of lower-inner quadrant of right female breast: Secondary | ICD-10-CM | POA: Diagnosis not present

## 2023-11-22 DIAGNOSIS — J479 Bronchiectasis, uncomplicated: Secondary | ICD-10-CM | POA: Diagnosis not present

## 2023-11-22 DIAGNOSIS — M79631 Pain in right forearm: Secondary | ICD-10-CM | POA: Diagnosis not present

## 2023-11-22 DIAGNOSIS — I129 Hypertensive chronic kidney disease with stage 1 through stage 4 chronic kidney disease, or unspecified chronic kidney disease: Secondary | ICD-10-CM | POA: Diagnosis not present

## 2023-12-06 ENCOUNTER — Telehealth: Payer: Self-pay | Admitting: Internal Medicine

## 2023-12-06 MED ORDER — ALBUTEROL SULFATE HFA 108 (90 BASE) MCG/ACT IN AERS: 1.0000 | INHALATION_SPRAY | RESPIRATORY_TRACT | 2 refills | Status: AC | PRN

## 2023-12-06 NOTE — Telephone Encounter (Signed)
 Albuterol refill sent to preferred pharm  Nothing further needed

## 2023-12-06 NOTE — Telephone Encounter (Signed)
 [facility] on [address]. PT needs Albuterol refill Inhaler.  Her # is [phone]

## 2023-12-07 DIAGNOSIS — R5383 Other fatigue: Secondary | ICD-10-CM | POA: Diagnosis not present

## 2023-12-07 DIAGNOSIS — K5900 Constipation, unspecified: Secondary | ICD-10-CM | POA: Diagnosis not present

## 2023-12-07 DIAGNOSIS — I10 Essential (primary) hypertension: Secondary | ICD-10-CM | POA: Diagnosis not present

## 2023-12-07 DIAGNOSIS — B349 Viral infection, unspecified: Secondary | ICD-10-CM | POA: Diagnosis not present

## 2023-12-07 DIAGNOSIS — Z6824 Body mass index (BMI) 24.0-24.9, adult: Secondary | ICD-10-CM | POA: Diagnosis not present

## 2023-12-07 DIAGNOSIS — Z20828 Contact with and (suspected) exposure to other viral communicable diseases: Secondary | ICD-10-CM | POA: Diagnosis not present

## 2023-12-07 DIAGNOSIS — R5381 Other malaise: Secondary | ICD-10-CM | POA: Diagnosis not present

## 2023-12-07 DIAGNOSIS — Z8673 Personal history of transient ischemic attack (TIA), and cerebral infarction without residual deficits: Secondary | ICD-10-CM | POA: Diagnosis not present

## 2023-12-07 DIAGNOSIS — R531 Weakness: Secondary | ICD-10-CM | POA: Diagnosis not present

## 2023-12-10 DIAGNOSIS — I700 Atherosclerosis of aorta: Secondary | ICD-10-CM | POA: Diagnosis not present

## 2023-12-10 DIAGNOSIS — J479 Bronchiectasis, uncomplicated: Secondary | ICD-10-CM | POA: Diagnosis not present

## 2023-12-10 DIAGNOSIS — I10 Essential (primary) hypertension: Secondary | ICD-10-CM | POA: Diagnosis not present

## 2023-12-17 DIAGNOSIS — J479 Bronchiectasis, uncomplicated: Secondary | ICD-10-CM | POA: Diagnosis not present

## 2023-12-17 DIAGNOSIS — I10 Essential (primary) hypertension: Secondary | ICD-10-CM | POA: Diagnosis not present

## 2023-12-17 DIAGNOSIS — Z853 Personal history of malignant neoplasm of breast: Secondary | ICD-10-CM | POA: Diagnosis not present

## 2024-01-08 DIAGNOSIS — J479 Bronchiectasis, uncomplicated: Secondary | ICD-10-CM | POA: Diagnosis not present

## 2024-01-08 DIAGNOSIS — I700 Atherosclerosis of aorta: Secondary | ICD-10-CM | POA: Diagnosis not present

## 2024-01-08 DIAGNOSIS — I10 Essential (primary) hypertension: Secondary | ICD-10-CM | POA: Diagnosis not present

## 2024-01-16 DIAGNOSIS — I10 Essential (primary) hypertension: Secondary | ICD-10-CM | POA: Diagnosis not present

## 2024-01-16 DIAGNOSIS — J479 Bronchiectasis, uncomplicated: Secondary | ICD-10-CM | POA: Diagnosis not present

## 2024-01-16 DIAGNOSIS — Z853 Personal history of malignant neoplasm of breast: Secondary | ICD-10-CM | POA: Diagnosis not present

## 2024-01-16 DIAGNOSIS — I700 Atherosclerosis of aorta: Secondary | ICD-10-CM | POA: Diagnosis not present

## 2024-02-07 DIAGNOSIS — I700 Atherosclerosis of aorta: Secondary | ICD-10-CM | POA: Diagnosis not present

## 2024-02-07 DIAGNOSIS — I10 Essential (primary) hypertension: Secondary | ICD-10-CM | POA: Diagnosis not present

## 2024-02-07 DIAGNOSIS — J479 Bronchiectasis, uncomplicated: Secondary | ICD-10-CM | POA: Diagnosis not present

## 2024-02-16 DIAGNOSIS — I10 Essential (primary) hypertension: Secondary | ICD-10-CM | POA: Diagnosis not present

## 2024-02-16 DIAGNOSIS — I700 Atherosclerosis of aorta: Secondary | ICD-10-CM | POA: Diagnosis not present

## 2024-02-16 DIAGNOSIS — J479 Bronchiectasis, uncomplicated: Secondary | ICD-10-CM | POA: Diagnosis not present

## 2024-02-16 DIAGNOSIS — Z853 Personal history of malignant neoplasm of breast: Secondary | ICD-10-CM | POA: Diagnosis not present

## 2024-02-26 ENCOUNTER — Encounter: Payer: Self-pay | Admitting: Genetic Counselor

## 2024-03-06 DIAGNOSIS — M6281 Muscle weakness (generalized): Secondary | ICD-10-CM | POA: Diagnosis not present

## 2024-03-06 DIAGNOSIS — I129 Hypertensive chronic kidney disease with stage 1 through stage 4 chronic kidney disease, or unspecified chronic kidney disease: Secondary | ICD-10-CM | POA: Diagnosis not present

## 2024-03-06 DIAGNOSIS — Z6824 Body mass index (BMI) 24.0-24.9, adult: Secondary | ICD-10-CM | POA: Diagnosis not present

## 2024-03-08 DIAGNOSIS — J479 Bronchiectasis, uncomplicated: Secondary | ICD-10-CM | POA: Diagnosis not present

## 2024-03-08 DIAGNOSIS — I700 Atherosclerosis of aorta: Secondary | ICD-10-CM | POA: Diagnosis not present

## 2024-03-08 DIAGNOSIS — I10 Essential (primary) hypertension: Secondary | ICD-10-CM | POA: Diagnosis not present

## 2024-03-11 ENCOUNTER — Encounter: Payer: Self-pay | Admitting: Diagnostic Neuroimaging

## 2024-03-11 ENCOUNTER — Ambulatory Visit (INDEPENDENT_AMBULATORY_CARE_PROVIDER_SITE_OTHER): Admitting: Diagnostic Neuroimaging

## 2024-03-11 VITALS — BP 138/69 | HR 67 | Ht 66.0 in | Wt 149.0 lb

## 2024-03-11 DIAGNOSIS — R799 Abnormal finding of blood chemistry, unspecified: Secondary | ICD-10-CM

## 2024-03-11 DIAGNOSIS — M6281 Muscle weakness (generalized): Secondary | ICD-10-CM

## 2024-03-11 NOTE — Progress Notes (Unsigned)
 [facility]  PATIENT: Monique Watson DOB: [birth year]  REFERRING CLINICIAN: Katina Pfeiffer, PA-C  HISTORY FROM: patient  REASON FOR VISIT: new consult    HISTORICAL  CHIEF COMPLAINT:  Chief Complaint  Patient presents with   Extremity Weakness    Rm 6 with daughter and son Pt is well, reports she has been having weakness in upper legs for about 3 months, weakness has progressed overtime.  She has not had any falls, but does have imbalance and impaired gait.  No other symptoms.     HISTORY OF PRESENT ILLNESS:   UPDATE (03/11/24, VRP): Since last visit, doing well until 2 to 3 months ago started having episodes where she feels like her legs could give out.  These happen after she walks for a few minutes.  This is been worsening over time.  Fortunately she has not had any falls.  She feels like she is off balance and could fall at any point.  No problems with upper extremities.  No pain.  PRIOR HPI (11/29/18, VRP): 83 year old female here for evaluation of abnormal spell.  09/27/2018, patient was at home making breakfast with son.  She had put something on the stove and sat down.  Then she began to feel dizzy, had trouble talking, had trouble moving her left side.  She briefly lost vision as well.  She was having slurred speech.  Her son called 911 and patient was taken to the hospital.  Upon arrival she was found to have left-sided weakness, left neglect and gaze deviation.  Symptoms rapidly improved and CTA of the head and neck showed no large vessel occlusion.  Patient was admitted for evaluation.  MRI of the brain was negative for acute stroke.  Stroke work-up was completed.  Patient was diagnosed with possible toxic metabolic encephalopathy.  Since that time no further events.    REVIEW OF SYSTEMS: Full 14 system review of systems performed and negative with exception of: As per HPI.  ALLERGIES: Allergies  Allergen Reactions   Chlorhexidine  Anaphylaxis,  Hives, Shortness Of Breath and Itching   Arimidex [Anastrozole] Other (See Comments)    Body aches per patient. Montel Bohr, RN.   Diovan [Valsartan] Other (See Comments)    arthralgias   Uncoded Nonscreenable Allergen Other (See Comments)    Blood pressure medication patient stated that she has a low tolerance for    HOME MEDICATIONS: Outpatient Medications Prior to Visit  Medication Sig Dispense Refill   albuterol  (VENTOLIN  HFA) 108 (90 Base) MCG/ACT inhaler Inhale 1-2 puffs into the lungs every 4 (four) hours as needed for wheezing or shortness of breath. 18 g 2   amLODipine  (NORVASC ) 5 MG tablet Take 5 mg by mouth daily.     aspirin 81 MG tablet Take 81 mg by mouth daily.     Calcium  Carbonate-Vit D-Min (CALCIUM  1200) 1200-1000 MG-UNIT CHEW 1 tablet with a meal     CALCIUM  PO Take 1 tablet by mouth daily.     Cholecalciferol  (VITAMIN D3) 2000 UNITS TABS Take 2,000 Units by mouth daily.      docusate sodium  (COLACE) 100 MG capsule 1 capsule daily for constipation     losartan (COZAAR) 100 MG tablet Take 1 tablet by mouth daily.     Omega-3 Fatty Acids (FISH OIL CONCENTRATE) 1000 MG CAPS Take 1,000 mg by mouth daily.      potassium chloride  (K-DUR,KLOR-CON ) 10 MEQ tablet Take 10 mEq by mouth daily.     Probiotic Product (PROBIOTIC  PO) Take 1 capsule by mouth daily.     tamoxifen  (NOLVADEX ) 20 MG tablet Take 1 tablet (20 mg total) by mouth daily. 90 tablet 3   vitamin E  180 MG (400 UNITS) capsule Take 400 Units by mouth daily.      No facility-administered medications prior to visit.    PAST MEDICAL HISTORY: Past Medical History:  Diagnosis Date   Anemia    Aortic atherosclerosis (HCC)    CT chest   Arthritis    Breast cancer (HCC) 09/29/2011   s/p radiation   Hypertension    Ovarian cancer (HCC) 09/29/2011   s/p chemo, radiation   Pneumonia    Radiation 01/14/2009-02/10/2009   left breast 45 Gy, boosted to 5 Gy   Seasonal allergies    Vitamin D  deficiency     PAST  SURGICAL HISTORY: Past Surgical History:  Procedure Laterality Date   ABDOMINAL HYSTERECTOMY  2006   APPENDECTOMY     BLADDER SURGERY     BREAST LUMPECTOMY     BREAST LUMPECTOMY WITH RADIOACTIVE SEED LOCALIZATION Right 04/07/2015   Procedure: BREAST LUMPECTOMY WITH RADIOACTIVE SEED LOCALIZATION;  Surgeon: Debby Shipper, MD;  [facility];  Service: General;  Laterality: Right;   BUBBLE STUDY  02/20/2019   Procedure: BUBBLE STUDY;  Surgeon: Francyne Headland, MD;  Location: [facility];  Service: Cardiovascular;;   LOOP RECORDER INSERTION N/A 02/20/2019   Procedure: LOOP RECORDER INSERTION;  Surgeon: Francyne Headland, MD;  Location: [facility];  Service: Cardiovascular;  Laterality: N/A;   PORT-A-CATH REMOVAL N/A 07/13/2015   Procedure: REMOVAL PORT-A-CATH;  Surgeon: Debby Shipper, MD;  Location: [facility];  Service: General;  Laterality: N/A;   TEE WITHOUT CARDIOVERSION N/A 02/20/2019   Procedure: TRANSESOPHAGEAL ECHOCARDIOGRAM (TEE) WITH IMPLANTABLE LOOP RECORDER;  Surgeon: Francyne Headland, MD;  Location: [facility];  Service: Cardiovascular;  Laterality: N/A;   TONSILLECTOMY     TRANSTHORACIC ECHOCARDIOGRAM  09/2018   Poor quality study.  Mild LVH.  EF 55 to 60%.  No R WMA.  GR 1 DD.  Normal valves.    FAMILY HISTORY: Family History  Problem Relation Age of Onset   Ovarian cancer Mother 24   Colon cancer Brother 61   Breast cancer Paternal Aunt        6-7 with breast cancer; one bilateral, several premenopausal   Pancreatitis Brother 20   Colon cancer Paternal Aunt    Cancer Cousin        NOS    SOCIAL HISTORY: Social History   Socioeconomic History   Marital status: Widowed    Spouse name: Carlin   Number of children: 2   Years of education: 16   Highest education level: Not on file  Occupational History    Comment: retired   Occupation: retired  Tobacco Use   Smoking status: Former    Current packs/day: 0.00    Average  packs/day: 1 pack/day for 58.0 years (58.0 ttl pk-yrs)    Types: Cigarettes    Start date: 07/20/1959    Quit date: 07/19/2017    Years since quitting: 6.6   Smokeless tobacco: Never  Substance and Sexual Activity   Alcohol use: Yes    Alcohol/week: 1.0 standard drink of alcohol    Types: 1 Shots of liquor per week    Comment: 11/29/15 cocktail on weekends   Drug use: No   Sexual activity: Yes    Birth control/protection: Post-menopausal  Other Topics Concern   Not  on file  Social History Narrative   2025 - Lives with son    R handed    Caffeine use- coffee 2 cups daily, sometimes tea   Social Drivers of Corporate investment banker Strain: Not on file  Food Insecurity: Not on file  Transportation Needs: Not on file  Physical Activity: Not on file  Stress: Not on file  Social Connections: Not on file  Intimate Partner Violence: Not on file     PHYSICAL EXAM  GENERAL EXAM/CONSTITUTIONAL: Vitals:  Vitals:   03/11/24 1409  BP: 138/69  Pulse: 67  Weight: 149 lb (67.6 kg)  Height: 5' 6 (1.676 m)   Body mass index is 24.05 kg/m. Wt Readings from Last 3 Encounters:  03/11/24 149 lb (67.6 kg)  08/30/23 152 lb 9.6 oz (69.2 kg)  07/30/23 151 lb 3.2 oz (68.6 kg)   Patient is in no distress; well developed, nourished and groomed SLOW TO TURN HEAD TO LEFT; MILD STIFFNESS IN NECK  CARDIOVASCULAR: Examination of carotid arteries is normal; no carotid bruits Regular rate and rhythm, no murmurs Examination of peripheral vascular system by observation and palpation is normal  EYES: Ophthalmoscopic exam of optic discs and posterior segments is normal; no papilledema or hemorrhages No results found.  MUSCULOSKELETAL: Gait, strength, tone, movements noted in Neurologic exam below  NEUROLOGIC: MENTAL STATUS:      No data to display         awake, alert, oriented to person, place and time recent and remote memory intact normal attention and concentration language  fluent, comprehension intact, naming intact fund of knowledge appropriate  CRANIAL NERVE:  2nd - no papilledema on fundoscopic exam 2nd, 3rd, 4th, 6th - pupils equal and reactive to light, visual fields full to confrontation, extraocular muscles intact, no nystagmus 5th - facial sensation symmetric 7th - facial strength symmetric 8th - hearing intact 9th - palate elevates symmetrically, uvula midline 11th - shoulder shrug --> LEFT SHOULDER ELEVATION 12th - tongue protrusion midline  MOTOR:  normal bulk and tone, full strength in the BUE, BLE; EXCEPT DELTOIDS 4, BICEPS  4, TRICEPS 4, HIP FLEXION (RIGHT 3, LEFT 4); OTHERWISE 5  SENSORY:  normal and symmetric to light touch, temperature, vibration  COORDINATION:  finger-nose-finger, fine finger movements normal  REFLEXES:  deep tendon reflexes TRACE and symmetric  GAIT/STATION:  narrow based gait; SLIGHTLY CAUTIOUS AND UNSTEADY    DIAGNOSTIC DATA (LABS, IMAGING, TESTING) - I reviewed patient records, labs, notes, testing and imaging myself where available.  Lab Results  Component Value Date   WBC 5.4 02/17/2019   HGB 13.2 02/17/2019   HCT 39.7 02/17/2019   MCV 99 (H) 02/17/2019   PLT 100 (LL) 02/17/2019      Component Value Date/Time   NA 145 (H) 02/17/2019 1136   NA 144 01/16/2017 1430   K 4.0 02/17/2019 1136   K 3.5 01/16/2017 1430   CL 105 02/17/2019 1136   CL 112 (H) 10/24/2012 1119   CO2 23 02/17/2019 1136   CO2 26 01/16/2017 1430   GLUCOSE 87 02/17/2019 1136   GLUCOSE 141 (H) 09/27/2018 1253   GLUCOSE 99 01/16/2017 1430   GLUCOSE 103 (H) 10/24/2012 1119   BUN 11 02/17/2019 1136   BUN 14.6 01/16/2017 1430   CREATININE 0.90 02/17/2019 1136   CREATININE 0.9 01/16/2017 1430   CALCIUM  9.0 02/17/2019 1136   CALCIUM  9.1 01/16/2017 1430   PROT 7.4 09/27/2018 1250   PROT 7.4 01/16/2017 1430  ALBUMIN 3.4 (L) 09/27/2018 1250   ALBUMIN 3.4 (L) 01/16/2017 1430   AST 33 09/27/2018 1250   AST 23 01/16/2017 1430    ALT 20 09/27/2018 1250   ALT 19 01/16/2017 1430   ALKPHOS 50 09/27/2018 1250   ALKPHOS 54 01/16/2017 1430   BILITOT 0.8 09/27/2018 1250   BILITOT 0.35 01/16/2017 1430   GFRNONAA 61 02/17/2019 1136   GFRAA 70 02/17/2019 1136   Lab Results  Component Value Date   CHOL 140 09/28/2018   HDL 62 09/28/2018   LDLCALC 62 09/28/2018   TRIG 80 09/28/2018   CHOLHDL 2.3 09/28/2018   Lab Results  Component Value Date   HGBA1C 4.7 (L) 09/28/2018   Lab Results  Component Value Date   VITAMINB12 515 11/29/2015   Lab Results  Component Value Date   TSH 1.740 11/29/2015    09/28/18 TTE - Endocardial border definition is poor. Systolic function appears grossly normal.  09/27/18 CT head / CT perfusion / CTA head / neck [I reviewed images myself and agree with interpretation. -VRP]  1. No emergent large vessel occlusion. 2. Minimal cervical carotid artery atherosclerosis without stenosis. 3. Mild right and moderate left V4 stenoses. 4. Mild bilateral intracranial ICA stenosis. 5. Negative CT perfusion imaging. 6. No evidence of intracranial metastatic disease. 7. Small retropharyngeal lipoma. 8. Aortic Atherosclerosis (ICD10-I70.0).  09/28/18 MRI brain [I reviewed images myself and agree with interpretation. -VRP]  1. No acute or subacute infarct to explain the patient's symptoms. 2. Normal and stable MRI appearance of the brain for age. 3. Mild sinus disease appears chronic. 4. Degenerative changes within the upper cervical spine.  02/24/22 MRI brain  1. No acute intracranial abnormality. 2. Findings of chronic small vessel ischemia.    ASSESSMENT AND PLAN  83 y.o. year old female here with:  Dx:  1. Muscle weakness   2. Abnormal finding of blood chemistry, unspecified     PLAN:  Proximal muscle weakness + hoffman's sign - proceed with myopathy, neuropathy lab workup - check MRI cervical spine (rule out myelopathy)  Orders Placed This Encounter  Procedures   MR  CERVICAL SPINE WO CONTRAST   CK   Aldolase   Vitamin B12   Acetylcholine receptor, binding   Hemoglobin A1c   C-reactive Protein   Sedimentation Rate   Ambulatory referral to Physical Therapy   Return for pending test results, pending if symptoms worsen or fail to improve.    EDUARD FABIENE HANLON, MD 03/11/2024, 3:00 PM Certified in Neurology, Neurophysiology and Neuroimaging  [facility] [address], [address] [place], [place] [address] [phone]

## 2024-03-13 DIAGNOSIS — N289 Disorder of kidney and ureter, unspecified: Secondary | ICD-10-CM | POA: Diagnosis not present

## 2024-03-13 LAB — ACETYLCHOLINE RECEPTOR, BINDING: AChR Binding Ab, Serum: <0.07 nmol/L (ref 0.00–0.24)

## 2024-03-13 LAB — CK: Total CK: 40 U/L (ref 26–161)

## 2024-03-13 LAB — HEMOGLOBIN A1C
Est. average glucose Bld gHb Est-mCnc: 94 mg/dL
Hgb A1c MFr Bld: 4.9 % (ref 4.8–5.6)

## 2024-03-13 LAB — SEDIMENTATION RATE: Sed Rate: 12 mm/h (ref 0–40)

## 2024-03-13 LAB — VITAMIN B12: Vitamin B-12: >2000 pg/mL — ABNORMAL HIGH (ref 232–1245)

## 2024-03-13 LAB — C-REACTIVE PROTEIN: CRP: 10 mg/L (ref 0–10)

## 2024-03-13 LAB — ALDOLASE: Aldolase: 2.5 U/L — ABNORMAL LOW (ref 3.3–10.3)

## 2024-03-14 ENCOUNTER — Telehealth: Payer: Self-pay | Admitting: Diagnostic Neuroimaging

## 2024-03-14 NOTE — Telephone Encounter (Signed)
No auth required sent to GI [phone]

## 2024-03-17 DIAGNOSIS — Z853 Personal history of malignant neoplasm of breast: Secondary | ICD-10-CM | POA: Diagnosis not present

## 2024-03-17 DIAGNOSIS — I10 Essential (primary) hypertension: Secondary | ICD-10-CM | POA: Diagnosis not present

## 2024-03-17 DIAGNOSIS — J479 Bronchiectasis, uncomplicated: Secondary | ICD-10-CM | POA: Diagnosis not present

## 2024-03-19 ENCOUNTER — Ambulatory Visit
Admission: RE | Admit: 2024-03-19 | Discharge: 2024-03-19 | Disposition: A | Discharge status: Home / Self Care | Source: Ambulatory Visit | Attending: Diagnostic Neuroimaging | Admitting: Diagnostic Neuroimaging

## 2024-03-19 ENCOUNTER — Telehealth: Payer: Self-pay | Admitting: Diagnostic Neuroimaging

## 2024-03-19 DIAGNOSIS — M6281 Muscle weakness (generalized): Secondary | ICD-10-CM | POA: Diagnosis not present

## 2024-03-19 DIAGNOSIS — M4802 Spinal stenosis, cervical region: Secondary | ICD-10-CM | POA: Diagnosis not present

## 2024-03-19 DIAGNOSIS — Z6823 Body mass index (BMI) 23.0-23.9, adult: Secondary | ICD-10-CM | POA: Diagnosis not present

## 2024-03-19 DIAGNOSIS — M542 Cervicalgia: Secondary | ICD-10-CM | POA: Diagnosis not present

## 2024-03-19 NOTE — Telephone Encounter (Signed)
 Patients daughter called her name is Monique Watson(she is on the [facility]) and wanted to let Dr. Margaret know that she is having the neck pain on the same side as the hip pain which was mentioned during her last visit. She had blood work done after the appointment and have not received a call with results. She wasn't expecting the blood results yet more concerned with the MRI. The patient made an appointment with primary because of the pain in the neck and daughter wants to make sure that she doesn't have multiple requests by two doctors and needs verification to cancel the appt with primary. She will call [facility] today to get scheduled,they reached out to the patient twice. Please call daughter to discuss.

## 2024-03-20 ENCOUNTER — Ambulatory Visit: Payer: Self-pay | Admitting: Neurology

## 2024-03-20 NOTE — Telephone Encounter (Signed)
 I sent a message to Norleen at rehab and he said they have the referral, they're just booked out right now. They will call her to schedule soon.

## 2024-03-20 NOTE — Telephone Encounter (Signed)
 Spoke w/Pt daughter calling back regarding MRI and lab results. Dtr inquiring as to why MRI was ordered. Discussed provider notes from visit on 03/11/24. Discussed result notes for labs and MRI and recommendation to f/u with PCP for neck pain. Dtr voiced understanding. Dtr asked about referral to PT. Order in will f/u and reach out to dtr. Dtr voiced thanks for the call.

## 2024-03-20 NOTE — Telephone Encounter (Signed)
 Cld Pt daughter. No answer, LVM for call back.

## 2024-03-20 NOTE — Telephone Encounter (Signed)
 Let them know we will result to bloodwork as soon as complete and schedule the mri cervical spine and the office will follow up after the results. At this time she should see primary care about the neck pain and, as mentioned, continue to get the imaging thank you

## 2024-03-20 NOTE — Telephone Encounter (Signed)
 Spoke w/Pt dtr, see phone note for 03/20/24.

## 2024-03-27 ENCOUNTER — Telehealth: Payer: Self-pay | Admitting: Physical Therapy

## 2024-03-27 ENCOUNTER — Ambulatory Visit: Attending: Diagnostic Neuroimaging | Admitting: Physical Therapy

## 2024-03-27 ENCOUNTER — Encounter: Payer: Self-pay | Admitting: Physical Therapy

## 2024-03-27 VITALS — BP 114/54 | HR 66

## 2024-03-27 DIAGNOSIS — R2681 Unsteadiness on feet: Secondary | ICD-10-CM | POA: Insufficient documentation

## 2024-03-27 DIAGNOSIS — M542 Cervicalgia: Secondary | ICD-10-CM | POA: Insufficient documentation

## 2024-03-27 DIAGNOSIS — M6281 Muscle weakness (generalized): Secondary | ICD-10-CM | POA: Diagnosis not present

## 2024-03-27 NOTE — Therapy (Signed)
 OUTPATIENT PHYSICAL THERAPY NEURO EVALUATION   Patient Name: Monique Watson MRN: [id number] DOB:04/11/1941, 83 y.o., female Today's Date: 03/27/2024   PCP: Cleotilde Planas, MD  REFERRING PROVIDER: Margaret Eduard SAUNDERS, MD  END OF SESSION:  PT End of Session - 03/27/24 1007     Visit Number 1    Number of Visits 13    Date for PT Re-Evaluation 05/26/24    Authorization Type MEDICARE PART A AND B    PT Start Time 1005    PT Stop Time 1048    PT Time Calculation (min) 43 min    Equipment Utilized During Treatment Gait belt    Activity Tolerance Patient tolerated treatment well    Behavior During Therapy WFL for tasks assessed/performed          Past Medical History:  Diagnosis Date   Anemia    Aortic atherosclerosis (HCC)    CT chest   Arthritis    Breast cancer (HCC) 09/29/2011   s/p radiation   Hypertension    Ovarian cancer (HCC) 09/29/2011   s/p chemo, radiation   Pneumonia    Radiation 01/14/2009-02/10/2009   left breast 45 Gy, boosted to 5 Gy   Seasonal allergies    Vitamin D  deficiency    Past Surgical History:  Procedure Laterality Date   ABDOMINAL HYSTERECTOMY  2006   APPENDECTOMY     BLADDER SURGERY     BREAST LUMPECTOMY     BREAST LUMPECTOMY WITH RADIOACTIVE SEED LOCALIZATION Right 04/07/2015   Procedure: BREAST LUMPECTOMY WITH RADIOACTIVE SEED LOCALIZATION;  Surgeon: Debby Shipper, MD;  Location: [facility];  Service: General;  Laterality: Right;   BUBBLE STUDY  02/20/2019   Procedure: BUBBLE STUDY;  Surgeon: Francyne Headland, MD;  Location: [facility];  Service: Cardiovascular;;   LOOP RECORDER INSERTION N/A 02/20/2019   Procedure: LOOP RECORDER INSERTION;  Surgeon: Francyne Headland, MD;  Location: [facility];  Service: Cardiovascular;  Laterality: N/A;   PORT-A-CATH REMOVAL N/A 07/13/2015   Procedure: REMOVAL PORT-A-CATH;  Surgeon: Debby Shipper, MD;  Location: [facility];  Service: General;  Laterality: N/A;   TEE  WITHOUT CARDIOVERSION N/A 02/20/2019   Procedure: TRANSESOPHAGEAL ECHOCARDIOGRAM (TEE) WITH IMPLANTABLE LOOP RECORDER;  Surgeon: Francyne Headland, MD;  Location: [facility];  Service: Cardiovascular;  Laterality: N/A;   TONSILLECTOMY     TRANSTHORACIC ECHOCARDIOGRAM  09/2018   Poor quality study.  Mild LVH.  EF 55 to 60%.  No R WMA.  GR 1 DD.  Normal valves.   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   Breast cancer of lower-inner quadrant of right female breast (HCC) 04/20/2015   Environmental and seasonal allergies 01/02/2015   Hx of colonic polyps 01/02/2015   Thrombocytopenia (HCC) 01/02/2015   Past use of tobacco 01/02/2015   Port-A-Cath in place 01/02/2015   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   Pneumonia 10/08/2014   Essential hypertension 10/08/2014   CAP (community  acquired pneumonia)    Genetic testing 10/07/2014   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   DOE (dyspnea on exertion) 07/20/2014   SOB (shortness of breath) 10/29/2011   Pneumonitis due to fumes and vapors (HCC) 10/29/2011   Acute bronchitis with bronchiectasis (HCC) 10/29/2011   Tobacco use disorder 10/29/2011   Hypertension 10/29/2011   Ovarian cancer (HCC) 09/29/2011    ONSET DATE: 03/11/2024  REFERRING DIAG: M62.81 (ICD-10-CM) - Muscle weakness  THERAPY DIAG:  Muscle weakness (generalized)  Unsteadiness on feet  Cervicalgia  Rationale for  Evaluation and Treatment: Rehabilitation  SUBJECTIVE:                                                                                                                                                                                             SUBJECTIVE STATEMENT: Reports weakness in legs, started this past spring and got worse as time went on. Has had some close calls, but did not have a fall. Was just doing things in the house and then the legs would get weak. Recently had MRI of cervical spine and it did not show any significant spinal cord problems, no central stenosis - just arthritis changes. Reports nothing is challenging for her to do - she just finds another way to adjust. Does not have any AD to use. Once testing sit <> stands, pt reporting dizziness going from sitting to standing and this has been going on for about a year   Pt accompanied by: self and son in waiting room, daughter Candis comes in to eval halfway through   PERTINENT HISTORY: PMH: HTN, hx of breast cancer, hx of ovarian cancer, hx of TIA, CKD stage 3  Per Dr. Margaret: reports she has been having weakness in upper legs for about 3 months, weakness has progressed overtime. She has not had any falls, but does have imbalance and impaired gait.   PAIN:  Are you having pain? No Depends on how she moves her head, can't move her neck too fast, feels like the bones are rubbing up against one another   Vitals:   03/27/24 1016  BP: (!) 114/54  Pulse: 66     PRECAUTIONS: Fall   FALLS: Has patient fallen in last 6 months? No and but has had some close calls   LIVING ENVIRONMENT: Lives with: lives with their son Lives in: House/apartment Stairs: No Has following equipment at home: Grab bars  PLOF: Independent and Leisure: going out to eat with friends, [facility] aerobic [facility]   PATIENT GOALS: Wants to walk and get rid of the weakness in her knees.  OBJECTIVE:  Note: Objective  measures were completed at Evaluation unless otherwise noted.  DIAGNOSTIC FINDINGS: 09/28/18 MRI brain [I reviewed images myself and agree with interpretation. -VRP]  1. No acute or subacute infarct to explain the patient's symptoms. 2. Normal and stable MRI appearance of the brain for age. 3. Mild sinus disease appears chronic. 4. Degenerative changes within the upper cervical spine.   02/24/22 MRI brain  1. No acute intracranial abnormality. 2. Findings of chronic small vessel ischemia.  MRI cervical spine 03/19/24: IMPRESSION:   This MRI of the cervical spine without contrast shows the following: The spinal cord is normal signal. Multilevel degenerative changes are noted.  No severe spinal stenosis is noted.  There is mild spinal stenosis at C3-C4 and t C4-C5, and mild to moderate spinal stenosis at C5-C6 and C6-C7. Multilevel foraminal narrowing.  There is potential for right C4 and left C5 and right C6 nerve root compression.    COGNITION: Overall cognitive status: Within functional limits for tasks assessed   SENSATION: Light touch: Impaired  and pt reports harder to detect at ankles LLE>RLE   COORDINATION: Heel to shin: WNL    POSTURE: rounded shoulders  LOWER EXTREMITY ROM:    Limited cervical AROM due to pain  LOWER EXTREMITY MMT:    MMT Right Eval Left Eval  Hip flexion 3- 4-  Hip extension    Hip abduction 4 4  Hip adduction 4+ 4+  Hip internal rotation    Hip external rotation    Knee flexion 4+ 4+  Knee extension 3+ 4+  Ankle dorsiflexion 5 5  Ankle plantarflexion    Ankle inversion    Ankle eversion    (Blank rows = not tested)  All tested in sitting   BED MOBILITY:  Pt reports no difficulties   TRANSFERS: Sit to stand: SBA  Assistive device utilized: None     Stand to sit: SBA  Assistive device utilized: None      Able to perform with no UE support during testing, but pt tends to perform with pressing up with BUE support from chair due to  weakness   GAIT: Findings: Gait Characteristics: decreased stride length and narrow BOS, Distance walked: Clinic distances, Assistive device utilized:None, Level of assistance: Modified independence and SBA, and Comments: pt wearing sandals with the wedge coming off, so pt's shoes were flopping around when she was walking   FUNCTIONAL TESTS:  5 times sit to stand: 49.4 seconds with no UE support, pt reporting incr knee pain  30 seconds chair stand test: 3 sit <> stands (below age related norms)  Pt wearing sandals wear one of the soles was broken off at the bottom and flopping around, held on assessing gait speed and balance today as pt not in supportive shoes and walking differently with these sandals. Educated pt to wear sneakers to therapy going forwards  TREATMENT DATE: 03/27/24  Self-Care: Rechecked BP in sitting and standing as pt reporting feeling lightheaded/dizzy when going from sitting to standing Sitting: 117/58, Standing: 105/56 Pt reports unsure if this made her feel dizzy, but does feel some lightheadedness when going to stand up  Pt reports that when she is looking up, feels like she might black out  Discussed with pt and pt's daughter to discuss this with her PCP (as pt has not told them about feeling like she might black out, just her neck pain). Pt's daughter also to relay BP readings as pt on BP medications (but did not take this morning) Educated on staying well hydrated and drinking plenty of water with pt reporting she mainly drinks tea  Pt also having neck pain, PT referral only for weakness, so will reach out to Dr. Margaret regarding this    PATIENT EDUCATION: Education details: Clinical findings, POC, See Self-Care above, wearing sneakers to next PT session Person educated: Patient and pt's daughter Education method: Explanation Education  comprehension: verbalized understanding and needs further education  HOME EXERCISE PROGRAM: Will provide at future session   GOALS: Goals reviewed with patient? Yes  SHORT TERM GOALS: Target date: 04/17/2024  Pt will be independent with initial HEP in order to build upon functional gains made in therapy. Baseline: Goal status: INITIAL  2.  FGA to be assessed with LTG written.  Baseline:  Goal status: INITIAL  3.  NDI to be assessed with LTG written for neck pain Baseline:  Goal status: INITIAL  4.  Pt will improve 5x sit<>stand to less than or equal to 35 sec to demonstrate improved functional strength and transfer efficiency.  Baseline: 49.4 seconds with no UE support, pt reporting incr knee pain  Goal status: INITIAL  5.  Gait speed to be assessed with STG/LTG written Baseline:  Goal status: INITIAL   LONG TERM GOALS: Target date: 05/08/2024  Pt will be independent with final HEP in order to build upon functional gains made in therapy. Baseline:  Goal status: INITIAL  2.  FGA goal to be written. Baseline:  Goal status: INITIAL  3.  NDI goal to be written.  Baseline:  Goal status: INITIAL  4.  Gait speed goal to be written.  Baseline:  Goal status: INITIAL  5.  Pt improve 30 second chair stand to at least 6 sit <> stands with no UE support for improved functional strength/endurance.  Baseline:  3 sit <> stands (below age related norms) Goal status: INITIAL    ASSESSMENT:  CLINICAL IMPRESSION: Patient is a 83 year old female referred to Neuro OPPT for muscle weakness. Pt also reporting incr neck pain - got MRI of cervical spine recently and just showed degenerative changes. PT to also reach out to Dr. Margaret regarding referral for this.   Pt's PMH is significant for: HTN, hx of breast cancer, hx of ovarian cancer, hx of TIA, CKD stage 3. The following deficits were present during the exam: neck pain, abnormal posture, gait abnormalities, impaired balance,  decr strength, impaired sensation more distally. Pt with lower BP from sitting and standing (but not enough for orthostatics). Discussed mentioning this to pt's PCP as pt does report some dizziness/lightheadedness when standing up too quickly. Based on 5x sit <> stand, pt is an incr risk for falls. Based on 30 second chair stand, pt below age related norms indicating decr strength/endurance. Unable to perform further gait and balance assessment as pt wearing broken sandals today.  Pt would  benefit from skilled PT to address these impairments and functional limitations to maximize functional mobility independence and decr fall risk.    OBJECTIVE IMPAIRMENTS: Abnormal gait, decreased activity tolerance, decreased balance, decreased knowledge of use of DME, decreased mobility, difficulty walking, decreased ROM, decreased strength, dizziness, hypomobility, increased muscle spasms, impaired flexibility, impaired sensation, postural dysfunction, and pain.   ACTIVITY LIMITATIONS: stairs, transfers, and locomotion level  PARTICIPATION LIMITATIONS: driving, shopping, and community activity  PERSONAL FACTORS: Age, Behavior pattern, Past/current experiences, Time since onset of injury/illness/exacerbation, and 3+ comorbidities: HTN, hx of breast cancer, hx of ovarian cancer, hx of TIA, CKD stage 3 are also affecting patient's functional outcome.   REHAB POTENTIAL: Good  CLINICAL DECISION MAKING: Evolving/moderate complexity  EVALUATION COMPLEXITY: Moderate  PLAN:  PT FREQUENCY: 2x/week  PT DURATION: 8 weeks  PLANNED INTERVENTIONS: 97164- PT Re-evaluation, 97110-Therapeutic exercises, 97530- Therapeutic activity, 97112- Neuromuscular re-education, 97535- Self Care, [id number]- Manual therapy, [phone]- Gait training, [phone]- Aquatic Therapy, Patient/Family education, Balance training, Stair training, Vestibular training, and DME instructions  PLAN FOR NEXT SESSION: check BP, check gait speed and FGA now that pt  is wearing sneakers and write goal. Do NDI for neck pain. Initial HEP for strengthening    Sheffield LOISE Senate, PT, DPT 03/27/2024, 11:33 AM

## 2024-03-27 NOTE — Telephone Encounter (Signed)
 Dr. Margaret,  Avelina GORMAN Like  was evaluated by PT on 03/27/2024 for muscle weakness. Can you please also put in a referral for neck pain so we can address this as well? Now that her cervical MRI came back clear with only degenerative changes.     If you agree, please place a referral for neck pain in [facility] in [facility]  Thank you,  Sheffield Senate, PT, DPT 03/27/24 11:01 AM    [facility] [address] [place], [place]  [address] Phone:  [phone] Fax:  [phone]

## 2024-03-31 ENCOUNTER — Encounter: Payer: Self-pay | Admitting: Physical Therapy

## 2024-03-31 ENCOUNTER — Ambulatory Visit: Admitting: Physical Therapy

## 2024-03-31 VITALS — BP 113/58 | HR 72

## 2024-03-31 DIAGNOSIS — M542 Cervicalgia: Secondary | ICD-10-CM | POA: Diagnosis not present

## 2024-03-31 DIAGNOSIS — R2681 Unsteadiness on feet: Secondary | ICD-10-CM

## 2024-03-31 DIAGNOSIS — M6281 Muscle weakness (generalized): Secondary | ICD-10-CM

## 2024-03-31 NOTE — Therapy (Signed)
 OUTPATIENT PHYSICAL THERAPY NEURO EVALUATION   Patient Name: Monique Watson MRN: [id number] DOB:[birth year], 83 y.o., female Today's Date: 03/31/2024   PCP: Cleotilde Planas, MD  REFERRING PROVIDER: Margaret Eduard SAUNDERS, MD  END OF SESSION:  PT End of Session - 03/31/24 1447     Visit Number 2    Number of Visits 13    Date for PT Re-Evaluation 05/26/24    Authorization Type MEDICARE PART A AND B    PT Start Time 1445    PT Stop Time 1528    PT Time Calculation (min) 43 min    Equipment Utilized During Treatment Gait belt    Activity Tolerance Patient tolerated treatment well    Behavior During Therapy WFL for tasks assessed/performed          Past Medical History:  Diagnosis Date   Anemia    Aortic atherosclerosis (HCC)    CT chest   Arthritis    Breast cancer (HCC) 09/29/2011   s/p radiation   Hypertension    Ovarian cancer (HCC) 09/29/2011   s/p chemo, radiation   Pneumonia    Radiation 01/14/2009-02/10/2009   left breast 45 Gy, boosted to 5 Gy   Seasonal allergies    Vitamin D  deficiency    Past Surgical History:  Procedure Laterality Date   ABDOMINAL HYSTERECTOMY  2006   APPENDECTOMY     BLADDER SURGERY     BREAST LUMPECTOMY     BREAST LUMPECTOMY WITH RADIOACTIVE SEED LOCALIZATION Right 04/07/2015   Procedure: BREAST LUMPECTOMY WITH RADIOACTIVE SEED LOCALIZATION;  Surgeon: Debby Shipper, MD;  Location: [facility];  Service: General;  Laterality: Right;   BUBBLE STUDY  02/20/2019   Procedure: BUBBLE STUDY;  Surgeon: Francyne Headland, MD;  Location: [facility];  Service: Cardiovascular;;   LOOP RECORDER INSERTION N/A 02/20/2019   Procedure: LOOP RECORDER INSERTION;  Surgeon: Francyne Headland, MD;  Location: [facility];  Service: Cardiovascular;  Laterality: N/A;   PORT-A-CATH REMOVAL N/A 07/13/2015   Procedure: REMOVAL PORT-A-CATH;  Surgeon: Debby Shipper, MD;  Location: [facility];  Service: General;  Laterality: N/A;   TEE  WITHOUT CARDIOVERSION N/A 02/20/2019   Procedure: TRANSESOPHAGEAL ECHOCARDIOGRAM (TEE) WITH IMPLANTABLE LOOP RECORDER;  Surgeon: Francyne Headland, MD;  Location: [facility];  Service: Cardiovascular;  Laterality: N/A;   TONSILLECTOMY     TRANSTHORACIC ECHOCARDIOGRAM  09/2018   Poor quality study.  Mild LVH.  EF 55 to 60%.  No R WMA.  GR 1 DD.  Normal valves.   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   Breast cancer of lower-inner quadrant of right female breast (HCC) 04/20/2015   Environmental and seasonal allergies 01/02/2015   Hx of colonic polyps 01/02/2015   Thrombocytopenia (HCC) 01/02/2015   Past use of tobacco 01/02/2015   Port-A-Cath in place 01/02/2015   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   Pneumonia 10/08/2014   Essential hypertension 10/08/2014   CAP (community  acquired pneumonia)    Genetic testing 10/07/2014   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   DOE (dyspnea on exertion) 07/20/2014   SOB (shortness of breath) 10/29/2011   Pneumonitis due to fumes and vapors (HCC) 10/29/2011   Acute bronchitis with bronchiectasis (HCC) 10/29/2011   Tobacco use disorder 10/29/2011   Hypertension 10/29/2011   Ovarian cancer (HCC) 09/29/2011    ONSET DATE: 03/11/2024  REFERRING DIAG: M62.81 (ICD-10-CM) - Muscle weakness  THERAPY DIAG:  Muscle weakness (generalized)  Unsteadiness on feet  Cervicalgia  Rationale for  Evaluation and Treatment: Rehabilitation  SUBJECTIVE:                                                                                                                                                                                             SUBJECTIVE STATEMENT:   No changes, no falls. Can't remember what her BP was when they checked it.    Pt accompanied by: self and son in waiting room   PERTINENT HISTORY: PMH: HTN, hx of breast cancer, hx of ovarian cancer, hx of TIA, CKD stage 3  Per Dr. Margaret: reports she has been having weakness in upper legs for about 3 months, weakness has progressed overtime. She has not had any falls, but does have imbalance and impaired gait.   PAIN:  Are you having pain? No Depends on how she moves her head, can't move her neck too fast, feels like the bones are rubbing up against one another   Vitals:   03/31/24 1450 03/31/24 1452  BP: 126/60 (!) 113/58  Pulse: 68 72      PRECAUTIONS: Fall   FALLS: Has patient fallen in last 6 months? No and but has had some close calls   LIVING ENVIRONMENT: Lives with: lives with their son Lives in: House/apartment Stairs: No Has following equipment at home: Grab bars  PLOF: Independent and Leisure: going out to eat with friends, [facility] aerobic [facility]   PATIENT GOALS: Wants to walk and get rid of the weakness in her knees.   OBJECTIVE:  Note: Objective measures were completed at Evaluation unless otherwise noted.  DIAGNOSTIC FINDINGS: 09/28/18 MRI brain [I reviewed images myself and agree with interpretation. -VRP]  1. No acute or subacute infarct to explain the patient's symptoms. 2. Normal and stable MRI appearance of the brain for age. 3. Mild sinus disease appears chronic. 4. Degenerative changes within the upper cervical spine.   02/24/22 MRI brain  1. No acute intracranial abnormality. 2. Findings of chronic small vessel ischemia.  MRI cervical spine  03/19/24: IMPRESSION:   This MRI  of the cervical spine without contrast shows the following: The spinal cord is normal signal. Multilevel degenerative changes are noted.  No severe spinal stenosis is noted.  There is mild spinal stenosis at C3-C4 and t C4-C5, and mild to moderate spinal stenosis at C5-C6 and C6-C7. Multilevel foraminal narrowing.  There is potential for right C4 and left C5 and right C6 nerve root compression.    COGNITION: Overall cognitive status: Within functional limits for tasks assessed   SENSATION: Light touch: Impaired  and pt reports harder to detect at ankles LLE>RLE   COORDINATION: Heel to shin: WNL    POSTURE: rounded shoulders  LOWER EXTREMITY ROM:    Limited cervical AROM due to pain  LOWER EXTREMITY MMT:    MMT Right Eval Left Eval  Hip flexion 3- 4-  Hip extension    Hip abduction 4 4  Hip adduction 4+ 4+  Hip internal rotation    Hip external rotation    Knee flexion 4+ 4+  Knee extension 3+ 4+  Ankle dorsiflexion 5 5  Ankle plantarflexion    Ankle inversion    Ankle eversion    (Blank rows = not tested)  All tested in sitting   BED MOBILITY:  Pt reports no difficulties   TRANSFERS: Sit to stand: SBA  Assistive device utilized: None     Stand to sit: SBA  Assistive device utilized: None      Able to perform with no UE support during testing, but pt tends to perform with pressing up with BUE support from chair due to weakness   GAIT: Findings: Gait Characteristics: decreased stride length and narrow BOS, Distance walked: Clinic distances, Assistive device utilized:None, Level of assistance: Modified independence and SBA, and Comments: pt wearing sandals with the wedge coming off, so pt's shoes were flopping around when she was walking   FUNCTIONAL TESTS:  5 times sit to stand: 49.4 seconds with no UE support, pt reporting incr knee pain  30 seconds chair stand test: 3 sit <> stands (below age related norms)                                                                                                                               TREATMENT DATE: 03/31/24  Therapeutic Activity:   Vitals:   03/31/24 1450 03/31/24 1452  BP: 126/60 (!) 113/58  Pulse: 68 72   Sitting, Standing Pt denies lightheadedness and has been working on drinking more water at home.   NDI (pt taking incr time to fill out and needing assist from therapist): 10/50 = 20%, mild disability  CERVICAL ROM:    Active ROM AROM (deg) eval  Flexion 55, no pain in position, slight pain coming up  Extension 20, pt afraid of getting dizzy and has had close calls of passing out with looking up, just normally looks up with eyes  Right lateral flexion 20, felt a catch  Left lateral flexion 25  Right rotation 35  Left rotation 52   Pt reports that when she is looking up, feels like she might black out and has limited neck mobility into extension because of this, reiterated with pt to make sure she discusses this with her PCP (this was also discussed during the eval)    Gait speed: 15 seconds with no AD = 2.19 ft/sec   [facility] PT Assessment - 03/31/24 1511       Functional Gait  Assessment   Gait assessed  Yes    Gait Level Surface Walks 20 ft, slow speed, abnormal gait pattern, evidence for imbalance or deviates 10-15 in outside of the 12 in walkway width. Requires more than 7 sec to ambulate 20 ft.   8.75 seconds   Change in Gait Speed Able to change speed, demonstrates mild gait deviations, deviates 6-10 in outside of the 12 in walkway width, or no gait deviations, unable to achieve a major change in velocity, or uses a change in velocity, or uses an assistive device.    Gait with Horizontal Head Turns Performs head turns smoothly with slight change in gait velocity (eg, minor disruption to smooth gait path), deviates 6-10 in outside 12 in walkway width, or uses an assistive device.    Gait with Vertical Head Turns Performs task with slight change in  gait velocity (eg, minor disruption to smooth gait path), deviates 6 - 10 in outside 12 in walkway width or uses assistive device   pt with very limited head movement   Gait and Pivot Turn Pivot turns safely within 3 sec and stops quickly with no loss of balance.    Step Over Obstacle Is able to step over one shoe box (4.5 in total height) but must slow down and adjust steps to clear box safely. May require verbal cueing.    Gait with Narrow Base of Support Ambulates 7-9 steps.    Gait with Eyes Closed Walks 20 ft, slow speed, abnormal gait pattern, evidence for imbalance, deviates 10-15 in outside 12 in walkway width. Requires more than 9 sec to ambulate 20 ft.   13.75 seconds   Ambulating Backwards Walks 20 ft, slow speed, abnormal gait pattern, evidence for imbalance, deviates 10-15 in outside 12 in walkway width.   30 seconds   Steps Alternating feet, must use rail.    Total Score 17    FGA comment: 17/30 = High Fall Risk          Access Code: [email or web address] URL: [email or web address] Date: 03/31/2024 Prepared by: Sheffield Senate  Initiated HEP for gentle neck stretching:   Exercises - Seated Upper Trapezius Stretch  - 2 x daily - 7 x weekly - 3 sets - 30 hold  PATIENT EDUCATION: Education details: Clinical findings, POC, See Self-Care above, wearing sneakers to next PT session Person educated: Patient and pt's daughter Education method: Explanation Education comprehension: verbalized understanding and needs further education  HOME EXERCISE PROGRAM: Access Code: [email or web address] URL: [email or web address] Date: 03/31/2024 Prepared by: Sheffield Senate  Exercises - Seated Upper Trapezius Stretch  - 2 x daily - 7 x weekly - 3 sets - 30 hold  GOALS: Goals reviewed with patient? Yes  SHORT TERM GOALS: Target date: 04/17/2024  Pt will be independent with initial HEP in order to build upon functional gains made in therapy. Baseline: Goal status: INITIAL  2.   FGA to be assessed with LTG written.  Baseline:  Goal status: INITIAL  3.  NDI to be assessed with LTG written  for neck pain Baseline: 10/50 = 20%, mild disability Goal status: MET  4.  Pt will improve 5x sit<>stand to less than or equal to 35 sec to demonstrate improved functional strength and transfer efficiency.  Baseline: 49.4 seconds with no UE support, pt reporting incr knee pain  Goal status: INITIAL  5.  Gait speed to be assessed with STG/LTG written Baseline:  Goal status: INITIAL   LONG TERM GOALS: Target date: 05/08/2024  Pt will be independent with final HEP in order to build upon functional gains made in therapy. Baseline:  Goal status: INITIAL  2.  Pt will improve FGA to at least a 21/30 in order to demo decr fall risk.  Baseline: 17/30 Goal status: INITIAL  3.  Pt will decr NDI to 6/50 or less in order to demo decr neck pain.  Baseline: 10/50 = 20%, mild disability Goal status: INITIAL  4.  Pt will improve gait speed with no AD to at least 2.6 ft/sec in order to demo improved community mobility.   Baseline: Gait speed: 15 seconds with no AD = 2.19 ft/sec Goal status: INITIAL  5.  Pt improve 30 second chair stand to at least 6 sit <> stands with no UE support for improved functional strength/endurance.  Baseline:  3 sit <> stands (below age related norms) Goal status: INITIAL    ASSESSMENT:  CLINICAL IMPRESSION: Pt's neurologist put in referral for neck pain, so performed NDI today with pt scoring a 10/50, indicating mild disability in regards to neck pain (pt taking incr time to fill out). Remainder of session focused on further assessing outcome measures that were not assessed at eval. Pt scoring a 17/30 on the FGA, indicating a high fall risk. Pt's gait speed with no AD, indicating a limited community ambulator. LTGs updated as appropriate. Initiated HEP with gentle upper trap stretch to pt's tolerance. Will continue per POC.    OBJECTIVE IMPAIRMENTS:  Abnormal gait, decreased activity tolerance, decreased balance, decreased knowledge of use of DME, decreased mobility, difficulty walking, decreased ROM, decreased strength, dizziness, hypomobility, increased muscle spasms, impaired flexibility, impaired sensation, postural dysfunction, and pain.   ACTIVITY LIMITATIONS: stairs, transfers, and locomotion level  PARTICIPATION LIMITATIONS: driving, shopping, and community activity  PERSONAL FACTORS: Age, Behavior pattern, Past/current experiences, Time since onset of injury/illness/exacerbation, and 3+ comorbidities: HTN, hx of breast cancer, hx of ovarian cancer, hx of TIA, CKD stage 3 are also affecting patient's functional outcome.   REHAB POTENTIAL: Good  CLINICAL DECISION MAKING: Evolving/moderate complexity  EVALUATION COMPLEXITY: Moderate  PLAN:  PT FREQUENCY: 2x/week  PT DURATION: 8 weeks  PLANNED INTERVENTIONS: 97164- PT Re-evaluation, 97110-Therapeutic exercises, 97530- Therapeutic activity, 97112- Neuromuscular re-education, 97535- Self Care, [id number]- Manual therapy, [phone]- Gait training, [phone]- Aquatic Therapy, Patient/Family education, Balance training, Stair training, Vestibular training, and DME instructions  PLAN FOR NEXT SESSION: check BP, add to HEP for SIT TO STANDS, gentle neck stretching, posture, BLE strength (esp hip strength), balance tasks    Sheffield LOISE Senate, PT, DPT 03/31/2024, 3:29 PM

## 2024-04-02 ENCOUNTER — Encounter: Payer: Self-pay | Admitting: Physical Therapy

## 2024-04-02 ENCOUNTER — Ambulatory Visit: Admitting: Physical Therapy

## 2024-04-02 VITALS — BP 133/58 | HR 64

## 2024-04-02 DIAGNOSIS — R2681 Unsteadiness on feet: Secondary | ICD-10-CM

## 2024-04-02 DIAGNOSIS — M6281 Muscle weakness (generalized): Secondary | ICD-10-CM | POA: Diagnosis not present

## 2024-04-02 DIAGNOSIS — M542 Cervicalgia: Secondary | ICD-10-CM | POA: Diagnosis not present

## 2024-04-02 NOTE — Patient Instructions (Signed)
 Access Code: [email or web address] URL: [email or web address] Date: 04/02/2024 Prepared by: Daved Bull  Exercises - Seated Upper Trapezius Stretch  - 2 x daily - 7 x weekly - 3 sets - 30 hold - Sit to Stand  - 1 x daily - 7 x weekly - 2 sets - 8-10 reps - Seated Assisted Cervical Rotation with Towel  - 1 x daily - 4 x weekly - 1-2 sets - 10 reps - Mid-Lower Cervical Extension SNAG with Strap  - 1 x daily - 4 x weekly - 1-2 sets - 10 reps - Scapular retraction with resistance  - 1 x daily - 4 x weekly - 1-2 sets - 10 reps - Seated Shoulder Diagonal Pulls with Resistance  - 1 x daily - 4 x weekly - 2 sets - 10 reps

## 2024-04-02 NOTE — Therapy (Signed)
 OUTPATIENT PHYSICAL THERAPY NEURO TREATMENT   Patient Name: Monique Watson MRN: [id number] DOB:Jan 18, 1941, 83 y.o., female Today's Date: 04/02/2024   PCP: Cleotilde Planas, MD  REFERRING PROVIDER: Margaret Eduard SAUNDERS, MD  END OF SESSION:  PT End of Session - 04/02/24 1625     Visit Number 3    Number of Visits 13    Date for PT Re-Evaluation 05/26/24    Authorization Type MEDICARE PART A AND B    PT Start Time 1615    PT Stop Time 1700    PT Time Calculation (min) 45 min    Equipment Utilized During Treatment Gait belt    Activity Tolerance Patient tolerated treatment well    Behavior During Therapy WFL for tasks assessed/performed          Past Medical History:  Diagnosis Date   Anemia    Aortic atherosclerosis (HCC)    CT chest   Arthritis    Breast cancer (HCC) 09/29/2011   s/p radiation   Hypertension    Ovarian cancer (HCC) 09/29/2011   s/p chemo, radiation   Pneumonia    Radiation 01/14/2009-02/10/2009   left breast 45 Gy, boosted to 5 Gy   Seasonal allergies    Vitamin D  deficiency    Past Surgical History:  Procedure Laterality Date   ABDOMINAL HYSTERECTOMY  2006   APPENDECTOMY     BLADDER SURGERY     BREAST LUMPECTOMY     BREAST LUMPECTOMY WITH RADIOACTIVE SEED LOCALIZATION Right 04/07/2015   Procedure: BREAST LUMPECTOMY WITH RADIOACTIVE SEED LOCALIZATION;  Surgeon: Debby Shipper, MD;  Location: [facility];  Service: General;  Laterality: Right;   BUBBLE STUDY  02/20/2019   Procedure: BUBBLE STUDY;  Surgeon: Francyne Headland, MD;  Location: [facility];  Service: Cardiovascular;;   LOOP RECORDER INSERTION N/A 02/20/2019   Procedure: LOOP RECORDER INSERTION;  Surgeon: Francyne Headland, MD;  Location: [facility];  Service: Cardiovascular;  Laterality: N/A;   PORT-A-CATH REMOVAL N/A 07/13/2015   Procedure: REMOVAL PORT-A-CATH;  Surgeon: Debby Shipper, MD;  Location: [facility];  Service: General;  Laterality: N/A;   TEE  WITHOUT CARDIOVERSION N/A 02/20/2019   Procedure: TRANSESOPHAGEAL ECHOCARDIOGRAM (TEE) WITH IMPLANTABLE LOOP RECORDER;  Surgeon: Francyne Headland, MD;  Location: [facility];  Service: Cardiovascular;  Laterality: N/A;   TONSILLECTOMY     TRANSTHORACIC ECHOCARDIOGRAM  09/2018   Poor quality study.  Mild LVH.  EF 55 to 60%.  No R WMA.  GR 1 DD.  Normal valves.   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   Breast cancer of lower-inner quadrant of right female breast (HCC) 04/20/2015   Environmental and seasonal allergies 01/02/2015   Hx of colonic polyps 01/02/2015   Thrombocytopenia (HCC) 01/02/2015   Past use of tobacco 01/02/2015   Port-A-Cath in place 01/02/2015   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   Pneumonia 10/08/2014   Essential hypertension 10/08/2014   CAP (community  acquired pneumonia)    Genetic testing 10/07/2014   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   DOE (dyspnea on exertion) 07/20/2014   SOB (shortness of breath) 10/29/2011   Pneumonitis due to fumes and vapors (HCC) 10/29/2011   Acute bronchitis with bronchiectasis (HCC) 10/29/2011   Tobacco use disorder 10/29/2011   Hypertension 10/29/2011   Ovarian cancer (HCC) 09/29/2011    ONSET DATE: 03/11/2024  REFERRING DIAG: M62.81 (ICD-10-CM) - Muscle weakness  THERAPY DIAG:  Muscle weakness (generalized)  Unsteadiness on feet  Cervicalgia  Rationale for  Evaluation and Treatment: Rehabilitation  SUBJECTIVE:                                                                                                                                                                                             SUBJECTIVE STATEMENT:   No changes, no falls. Her BP was around 115 systolic this morning.  Moving her neck can cause extreme pain.   Pt accompanied by: self and son in waiting room   PERTINENT HISTORY: PMH: HTN, hx of breast cancer, hx of ovarian cancer, hx of TIA, CKD stage 3  Per Dr. Margaret: reports she has been having weakness in upper legs for about 3 months, weakness has progressed overtime. She has not had any falls, but does have imbalance and impaired gait.   PAIN:  Are you having pain? No Depends on how she moves her head, can't move her neck too fast, feels like the bones are rubbing up against one another  LUE in sitting prior to interventions: Vitals:   04/02/24 1623  BP: (!) 133/58  Pulse: 64      PRECAUTIONS: Fall   FALLS: Has patient fallen in last 6 months? No and but has had some close calls   LIVING ENVIRONMENT: Lives with: lives with their son Lives in: House/apartment Stairs: No Has following equipment at home: Grab bars  PLOF: Independent and Leisure: going out to eat with friends, [facility]   PATIENT GOALS: Wants to walk and get rid of the weakness in her knees.   OBJECTIVE:  Note: Objective measures were completed at Evaluation unless otherwise noted.  DIAGNOSTIC FINDINGS: 09/28/18 MRI brain [I reviewed images myself and agree with interpretation. -VRP]  1. No acute or subacute infarct to explain the patient's symptoms. 2. Normal and stable MRI appearance of the brain for age. 3. Mild sinus disease appears chronic. 4. Degenerative changes within the upper cervical spine.   02/24/22 MRI brain  1. No acute intracranial abnormality. 2. Findings of chronic small  vessel ischemia.  MRI cervical spine  03/19/24: IMPRESSION:   This MRI of the cervical spine without contrast shows the following: The spinal cord is normal signal. Multilevel degenerative changes are noted.  No severe spinal stenosis is noted.  There is mild spinal stenosis at C3-C4 and t C4-C5, and mild to moderate spinal stenosis at C5-C6 and C6-C7. Multilevel foraminal narrowing.  There is potential for right C4 and left C5 and right C6 nerve root compression.    COGNITION: Overall cognitive status: Within functional limits for tasks assessed   SENSATION: Light touch: Impaired  and pt reports harder to detect at ankles LLE>RLE   COORDINATION: Heel to shin: WNL    POSTURE: rounded shoulders  LOWER EXTREMITY ROM:    Limited cervical AROM due to pain  LOWER EXTREMITY MMT:    MMT Right Eval Left Eval  Hip flexion 3- 4-  Hip extension    Hip abduction 4 4  Hip adduction 4+ 4+  Hip internal rotation    Hip external rotation    Knee flexion 4+ 4+  Knee extension 3+ 4+  Ankle dorsiflexion 5 5  Ankle plantarflexion    Ankle inversion    Ankle eversion    (Blank rows = not tested)  All tested in sitting   BED MOBILITY:  Pt reports no difficulties   TRANSFERS: Sit to stand: SBA  Assistive device utilized: None     Stand to sit: SBA  Assistive device utilized: None      Able to perform with no UE support during testing, but pt tends to perform with pressing up with BUE support from chair due to weakness   GAIT: Findings: Gait Characteristics: decreased stride length and narrow BOS, Distance walked: Clinic distances, Assistive device utilized:None, Level of assistance: Modified independence and SBA, and Comments: pt wearing sandals with the wedge coming off, so pt's shoes were flopping around when she was walking   FUNCTIONAL TESTS:  5 times sit to stand: 49.4 seconds with no UE support, pt reporting incr knee pain  30 seconds chair stand test: 3 sit <> stands (below  age related norms)                                                                                                                              TREATMENT DATE: 04/02/24  Therapeutic Activity:  LUE in sitting prior to interventions: Vitals:   04/02/24 1623  BP: (!) 133/58  Pulse: 64   -Reviewed upper trap stretch 2x30 seconds each side and had pt roll out of stretch with improved tolerance -STS x10 w/ hands on knees intermittently x10, some increased RLE reliance on stand -Cervical rotation SNAGs x10 each side w/ multimodal cuing and return demo, pt reports no pain during movement -Cervical extension SNAGs x15 -Seated scapular retraction x15, pt has good form > added red theraband 2x10 -Red theraband diagonal pulls x15 each direction for periscapular engagement  PATIENT EDUCATION: Education details: Additions to HEP Person educated: Patient Education  method: Explanation Education comprehension: verbalized understanding and needs further education  HOME EXERCISE PROGRAM: Access Code: [email or web address] URL: [email or web address] Date: 04/02/2024 Prepared by: Daved Bull  Exercises - Seated Upper Trapezius Stretch  - 2 x daily - 7 x weekly - 3 sets - 30 hold - Sit to Stand  - 1 x daily - 7 x weekly - 2 sets - 8-10 reps - Seated Assisted Cervical Rotation with Towel  - 1 x daily - 4 x weekly - 1-2 sets - 10 reps - Mid-Lower Cervical Extension SNAG with Strap  - 1 x daily - 4 x weekly - 1-2 sets - 10 reps - Scapular retraction with resistance  - 1 x daily - 4 x weekly - 1-2 sets - 10 reps - Seated Shoulder Diagonal Pulls with Resistance  - 1 x daily - 4 x weekly - 2 sets - 10 reps  GOALS: Goals reviewed with patient? Yes  SHORT TERM GOALS: Target date: 04/17/2024  Pt will be independent with initial HEP in order to build upon functional gains made in therapy. Baseline: Goal status: INITIAL  2.  FGA to be assessed with LTG written.  Baseline:  Goal status:  INITIAL  3.  NDI to be assessed with LTG written for neck pain Baseline: 10/50 = 20%, mild disability Goal status: MET  4.  Pt will improve 5x sit<>stand to less than or equal to 35 sec to demonstrate improved functional strength and transfer efficiency.  Baseline: 49.4 seconds with no UE support, pt reporting incr knee pain  Goal status: INITIAL  5.  Gait speed to be assessed with STG/LTG written Baseline:  Goal status: INITIAL   LONG TERM GOALS: Target date: 05/08/2024  Pt will be independent with final HEP in order to build upon functional gains made in therapy. Baseline:  Goal status: INITIAL  2.  Pt will improve FGA to at least a 21/30 in order to demo decr fall risk.  Baseline: 17/30 Goal status: INITIAL  3.  Pt will decr NDI to 6/50 or less in order to demo decr neck pain.  Baseline: 10/50 = 20%, mild disability Goal status: INITIAL  4.  Pt will improve gait speed with no AD to at least 2.6 ft/sec in order to demo improved community mobility.   Baseline: Gait speed: 15 seconds with no AD = 2.19 ft/sec Goal status: INITIAL  5.  Pt improve 30 second chair stand to at least 6 sit <> stands with no UE support for improved functional strength/endurance.  Baseline:  3 sit <> stands (below age related norms) Goal status: INITIAL    ASSESSMENT:  CLINICAL IMPRESSION: Focus of skilled PT session today on further addressing neck pain and posture.  Patient has an excellent response to cervical SNAGs performed this date.  Modifications made to HEP to support periscapular strengthening and cervical mobility.  She continues to benefit from skilled PT to further address BLE strength and balance, pain management, and promote higher level of functioning.  Will continue per POC.  OBJECTIVE IMPAIRMENTS: Abnormal gait, decreased activity tolerance, decreased balance, decreased knowledge of use of DME, decreased mobility, difficulty walking, decreased ROM, decreased strength, dizziness,  hypomobility, increased muscle spasms, impaired flexibility, impaired sensation, postural dysfunction, and pain.   ACTIVITY LIMITATIONS: stairs, transfers, and locomotion level  PARTICIPATION LIMITATIONS: driving, shopping, and community activity  PERSONAL FACTORS: Age, Behavior pattern, Past/current experiences, Time since onset of injury/illness/exacerbation, and 3+ comorbidities: HTN, hx of breast cancer, hx of ovarian cancer, hx of TIA,  CKD stage 3 are also affecting patient's functional outcome.   REHAB POTENTIAL: Good  CLINICAL DECISION MAKING: Evolving/moderate complexity  EVALUATION COMPLEXITY: Moderate  PLAN:  PT FREQUENCY: 2x/week  PT DURATION: 8 weeks  PLANNED INTERVENTIONS: [id number]- PT Re-evaluation, 97110-Therapeutic exercises, 97530- Therapeutic activity, 97112- Neuromuscular re-education, 97535- Self Care, [id number]- Manual therapy, [phone]- Gait training, [phone]- Aquatic Therapy, Patient/Family education, Balance training, Stair training, Vestibular training, and DME instructions  PLAN FOR NEXT SESSION: check BP, add to HEP gentle neck stretching, posture, BLE strength (esp hip strength), balance tasks, standing rows, lat pulldowns, wall slides, anti-rotation tasks   Daved KATHEE Bull, PT, DPT 04/02/2024, 5:09 PM

## 2024-04-04 ENCOUNTER — Other Ambulatory Visit

## 2024-04-07 ENCOUNTER — Encounter: Payer: Self-pay | Admitting: Physical Therapy

## 2024-04-07 ENCOUNTER — Ambulatory Visit: Admitting: Physical Therapy

## 2024-04-07 VITALS — BP 120/58 | HR 72

## 2024-04-07 DIAGNOSIS — R2681 Unsteadiness on feet: Secondary | ICD-10-CM | POA: Diagnosis not present

## 2024-04-07 DIAGNOSIS — M542 Cervicalgia: Secondary | ICD-10-CM | POA: Diagnosis not present

## 2024-04-07 DIAGNOSIS — M6281 Muscle weakness (generalized): Secondary | ICD-10-CM | POA: Diagnosis not present

## 2024-04-07 DIAGNOSIS — I700 Atherosclerosis of aorta: Secondary | ICD-10-CM | POA: Diagnosis not present

## 2024-04-07 DIAGNOSIS — I10 Essential (primary) hypertension: Secondary | ICD-10-CM | POA: Diagnosis not present

## 2024-04-07 DIAGNOSIS — J479 Bronchiectasis, uncomplicated: Secondary | ICD-10-CM | POA: Diagnosis not present

## 2024-04-07 DIAGNOSIS — N289 Disorder of kidney and ureter, unspecified: Secondary | ICD-10-CM | POA: Diagnosis not present

## 2024-04-07 NOTE — Therapy (Signed)
 OUTPATIENT PHYSICAL THERAPY NEURO TREATMENT   Patient Name: Monique Watson MRN: [id number] DOB:Jun 21, 1941, 83 y.o., female Today's Date: 04/07/2024   PCP: Cleotilde Planas, MD  REFERRING PROVIDER: Margaret Eduard SAUNDERS, MD  END OF SESSION:  PT End of Session - 04/07/24 1231     Visit Number 4    Number of Visits 13    Date for PT Re-Evaluation 05/26/24    Authorization Type MEDICARE PART A AND B    PT Start Time 1230    PT Stop Time 1311    PT Time Calculation (min) 41 min    Equipment Utilized During Treatment Gait belt    Activity Tolerance Patient tolerated treatment well    Behavior During Therapy WFL for tasks assessed/performed          Past Medical History:  Diagnosis Date   Anemia    Aortic atherosclerosis (HCC)    CT chest   Arthritis    Breast cancer (HCC) 09/29/2011   s/p radiation   Hypertension    Ovarian cancer (HCC) 09/29/2011   s/p chemo, radiation   Pneumonia    Radiation 01/14/2009-02/10/2009   left breast 45 Gy, boosted to 5 Gy   Seasonal allergies    Vitamin D  deficiency    Past Surgical History:  Procedure Laterality Date   ABDOMINAL HYSTERECTOMY  2006   APPENDECTOMY     BLADDER SURGERY     BREAST LUMPECTOMY     BREAST LUMPECTOMY WITH RADIOACTIVE SEED LOCALIZATION Right 04/07/2015   Procedure: BREAST LUMPECTOMY WITH RADIOACTIVE SEED LOCALIZATION;  Surgeon: Debby Shipper, MD;  Location: [facility];  Service: General;  Laterality: Right;   BUBBLE STUDY  02/20/2019   Procedure: BUBBLE STUDY;  Surgeon: Francyne Headland, MD;  Location: [facility];  Service: Cardiovascular;;   LOOP RECORDER INSERTION N/A 02/20/2019   Procedure: LOOP RECORDER INSERTION;  Surgeon: Francyne Headland, MD;  Location: [facility];  Service: Cardiovascular;  Laterality: N/A;   PORT-A-CATH REMOVAL N/A 07/13/2015   Procedure: REMOVAL PORT-A-CATH;  Surgeon: Debby Shipper, MD;  Location: [facility];  Service: General;  Laterality: N/A;   TEE  WITHOUT CARDIOVERSION N/A 02/20/2019   Procedure: TRANSESOPHAGEAL ECHOCARDIOGRAM (TEE) WITH IMPLANTABLE LOOP RECORDER;  Surgeon: Francyne Headland, MD;  Location: [facility];  Service: Cardiovascular;  Laterality: N/A;   TONSILLECTOMY     TRANSTHORACIC ECHOCARDIOGRAM  09/2018   Poor quality study.  Mild LVH.  EF 55 to 60%.  No R WMA.  GR 1 DD.  Normal valves.   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   Breast cancer of lower-inner quadrant of right female breast (HCC) 04/20/2015   Environmental and seasonal allergies 01/02/2015   Hx of colonic polyps 01/02/2015   Thrombocytopenia (HCC) 01/02/2015   Past use of tobacco 01/02/2015   Port-A-Cath in place 01/02/2015   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   Pneumonia 10/08/2014   Essential hypertension 10/08/2014   CAP (community  acquired pneumonia)    Genetic testing 10/07/2014   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   DOE (dyspnea on exertion) 07/20/2014   SOB (shortness of breath) 10/29/2011   Pneumonitis due to fumes and vapors (HCC) 10/29/2011   Acute bronchitis with bronchiectasis (HCC) 10/29/2011   Tobacco use disorder 10/29/2011   Hypertension 10/29/2011   Ovarian cancer (HCC) 09/29/2011    ONSET DATE: 03/11/2024  REFERRING DIAG: M62.81 (ICD-10-CM) - Muscle weakness  THERAPY DIAG:  Muscle weakness (generalized)  Unsteadiness on feet  Cervicalgia  Rationale for  Evaluation and Treatment: Rehabilitation  SUBJECTIVE:                                                                                                                                                                                             SUBJECTIVE STATEMENT:  Had a full weekend, no falls. Neck is not catching as much, but it is still there. Feels like the exercises are helping.    Pt accompanied by: self and    PERTINENT HISTORY: PMH: HTN, hx of breast cancer, hx of ovarian cancer, hx of TIA, CKD stage 3  Per Dr. Margaret: reports she has been having weakness in upper legs for about 3 months, weakness has progressed overtime. She has not had any falls, but does have imbalance and impaired gait.   PAIN:  Are you having pain? No Depends on how she moves her head, can't move her neck too fast, feels like the bones are rubbing up against one another  When pain happens it goes get up to an 8/10   LUE in sitting prior to interventions: Vitals:   04/07/24 1235  BP: (!) 120/58  Pulse: 72     PRECAUTIONS: Fall   FALLS: Has patient fallen in last 6 months? No and but has had some close calls   LIVING ENVIRONMENT: Lives with: lives with their son Lives in: House/apartment Stairs: No Has following equipment at home: Grab bars  PLOF: Independent and Leisure: going out to eat with friends, [facility]   PATIENT GOALS: Wants to walk and get rid of the weakness in her knees.   OBJECTIVE:  Note: Objective measures were completed at Evaluation unless otherwise noted.  DIAGNOSTIC FINDINGS: 09/28/18 MRI brain [I reviewed images myself and agree with interpretation. -VRP]  1. No acute or subacute infarct to explain the patient's symptoms. 2. Normal and stable MRI appearance of the brain for age. 3. Mild sinus disease appears chronic. 4. Degenerative changes within the upper cervical spine.   02/24/22 MRI brain  1. No acute intracranial  abnormality.  2. Findings of chronic small vessel ischemia.  MRI cervical spine 03/19/24: IMPRESSION:   This MRI of the cervical spine without contrast shows the following: The spinal cord is normal signal. Multilevel degenerative changes are noted.  No severe spinal stenosis is noted.  There is mild spinal stenosis at C3-C4 and t C4-C5, and mild to moderate spinal stenosis at C5-C6 and C6-C7. Multilevel foraminal narrowing.  There is potential for right C4 and left C5 and right C6 nerve root compression.    COGNITION: Overall cognitive status: Within functional limits for tasks assessed   SENSATION: Light touch: Impaired  and pt reports harder to detect at ankles LLE>RLE   COORDINATION: Heel to shin: WNL    POSTURE: rounded shoulders  LOWER EXTREMITY ROM:    Limited cervical AROM due to pain  LOWER EXTREMITY MMT:    MMT Right Eval Left Eval  Hip flexion 3- 4-  Hip extension    Hip abduction 4 4  Hip adduction 4+ 4+  Hip internal rotation    Hip external rotation    Knee flexion 4+ 4+  Knee extension 3+ 4+  Ankle dorsiflexion 5 5  Ankle plantarflexion    Ankle inversion    Ankle eversion    (Blank rows = not tested)  All tested in sitting   BED MOBILITY:  Pt reports no difficulties   TRANSFERS: Sit to stand: SBA  Assistive device utilized: None     Stand to sit: SBA  Assistive device utilized: None      Able to perform with no UE support during testing, but pt tends to perform with pressing up with BUE support from chair due to weakness   GAIT: Findings: Gait Characteristics: decreased stride length and narrow BOS, Distance walked: Clinic distances, Assistive device utilized:None, Level of assistance: Modified independence and SBA, and Comments: pt wearing sandals with the wedge coming off, so pt's shoes were flopping around when she was walking   FUNCTIONAL TESTS:  5 times sit to stand: 49.4 seconds with no UE support, pt reporting incr knee pain  30 seconds  chair stand test: 3 sit <> stands (below age related norms)                                                                                                                              TREATMENT DATE: 04/07/24  Therapeutic Activity:  LUE in sitting prior to interventions: Vitals:   04/07/24 1235  BP: (!) 120/58  Pulse: 72   SciFit with BUE/BLE at Gear 2.0 Multi-peaks for 8 minutes for strengthening, circulation, improved ROM prior to activity. Pt reporting RPE as 5/10  On air ex: 2 x 5 reps sit <> stands without UE support, pt reporting RPE as 8/10 Standing shoulder ABD with red t-band 10 reps   Therapeutic Exercise:  With red t-band in door 2 x 10 reps scapular retraction with 3-5 second hold  2 x 10 reps lat pull downs  Pt reporting no pain with these  At staircase with 6 step Forward step ups 10 reps each side, needing cues for sequencing, esp with RLE Forward step ups with contralateral march for dynamic SLS 10 reps each side, pt fatigued easily with this    PATIENT EDUCATION: Education details: Continue HEP  Person educated: Patient Education method: Explanation Education comprehension: verbalized understanding and needs further education  HOME EXERCISE PROGRAM: Access Code: [email or web address] URL: [email or web address] Date: 04/02/2024 Prepared by: Daved Bull  Exercises - Seated Upper Trapezius Stretch  - 2 x daily - 7 x weekly - 3 sets - 30 hold - Sit to Stand  - 1 x daily - 7 x weekly - 2 sets - 8-10 reps - Seated Assisted Cervical Rotation with Towel  - 1 x daily - 4 x weekly - 1-2 sets - 10 reps - Mid-Lower Cervical Extension SNAG with Strap  - 1 x daily - 4 x weekly - 1-2 sets - 10 reps - Scapular retraction with resistance  - 1 x daily - 4 x weekly - 1-2 sets - 10 reps - Seated Shoulder Diagonal Pulls with Resistance  - 1 x daily - 4 x weekly - 2 sets - 10 reps  GOALS: Goals reviewed with patient? Yes  SHORT TERM GOALS: Target date:  04/17/2024  Pt will be independent with initial HEP in order to build upon functional gains made in therapy. Baseline: Goal status: INITIAL  2.  FGA to be assessed with LTG written.  Baseline: LTG WRITTEN Goal status: MET  3.  NDI to be assessed with LTG written for neck pain Baseline: 10/50 = 20%, mild disability Goal status: MET  4.  Pt will improve 5x sit<>stand to less than or equal to 35 sec to demonstrate improved functional strength and transfer efficiency.  Baseline: 49.4 seconds with no UE support, pt reporting incr knee pain  Goal status: INITIAL  5.  Gait speed to be assessed with STG/LTG written Baseline: LTG written Goal status: MET   LONG TERM GOALS: Target date: 05/08/2024  Pt will be independent with final HEP in order to build upon functional gains made in therapy. Baseline:  Goal status: INITIAL  2.  Pt will improve FGA to at least a 21/30 in order to demo decr fall risk.  Baseline: 17/30 Goal status: INITIAL  3.  Pt will decr NDI to 6/50 or less in order to demo decr neck pain.  Baseline: 10/50 = 20%, mild disability Goal status: INITIAL  4.  Pt will improve gait speed with no AD to at least 2.6 ft/sec in order to demo improved community mobility.   Baseline: Gait speed: 15 seconds with no AD = 2.19 ft/sec Goal status: INITIAL  5.  Pt improve 30 second chair stand to at least 6 sit <> stands with no UE support for improved functional strength/endurance.  Baseline:  3 sit <> stands (below age related norms) Goal status: INITIAL    ASSESSMENT:  CLINICAL IMPRESSION: Today's skilled session focused on addressing neck pain/posture and BLE strengthening. Pt reports improvement in neck pain from HEP and has no reports of incr pain during session.  Pt fatigues easily with BLE strengthening, esp with step ups. Pt more challenged when leading with RLE compared to LLE. Will continue per POC.   OBJECTIVE IMPAIRMENTS: Abnormal gait, decreased activity  tolerance, decreased balance, decreased knowledge of use of DME, decreased mobility, difficulty walking, decreased ROM, decreased strength, dizziness, hypomobility, increased muscle spasms, impaired flexibility, impaired sensation,  postural dysfunction, and pain.   ACTIVITY LIMITATIONS: stairs, transfers, and locomotion level  PARTICIPATION LIMITATIONS: driving, shopping, and community activity  PERSONAL FACTORS: Age, Behavior pattern, Past/current experiences, Time since onset of injury/illness/exacerbation, and 3+ comorbidities: HTN, hx of breast cancer, hx of ovarian cancer, hx of TIA, CKD stage 3 are also affecting patient's functional outcome.   REHAB POTENTIAL: Good  CLINICAL DECISION MAKING: Evolving/moderate complexity  EVALUATION COMPLEXITY: Moderate  PLAN:  PT FREQUENCY: 2x/week  PT DURATION: 8 weeks  PLANNED INTERVENTIONS: [id number]- PT Re-evaluation, 97110-Therapeutic exercises, 97530- Therapeutic activity, 97112- Neuromuscular re-education, 97535- Self Care, [id number]- Manual therapy, [phone]- Gait training, [phone]- Aquatic Therapy, Patient/Family education, Balance training, Stair training, Vestibular training, and DME instructions  PLAN FOR NEXT SESSION: check BP,   add to HEP gentle neck stretching, posture, BLE strength (esp hip strength), balance tasks, standing rows, lat pulldowns, wall slides, anti-rotation tasks   Sheffield LOISE Senate, PT, DPT 04/07/2024, 1:12 PM

## 2024-04-09 ENCOUNTER — Ambulatory Visit: Admitting: Physical Therapy

## 2024-04-09 ENCOUNTER — Encounter: Payer: Self-pay | Admitting: Physical Therapy

## 2024-04-09 VITALS — BP 124/61 | HR 67

## 2024-04-09 DIAGNOSIS — M6281 Muscle weakness (generalized): Secondary | ICD-10-CM | POA: Diagnosis not present

## 2024-04-09 DIAGNOSIS — R2681 Unsteadiness on feet: Secondary | ICD-10-CM | POA: Diagnosis not present

## 2024-04-09 DIAGNOSIS — M542 Cervicalgia: Secondary | ICD-10-CM | POA: Diagnosis not present

## 2024-04-09 NOTE — Therapy (Signed)
 OUTPATIENT PHYSICAL THERAPY NEURO TREATMENT   Patient Name: Monique Watson MRN: [id number] DOB:[birth year], 83 y.o., female Today's Date: 04/09/2024   PCP: Cleotilde Planas, MD  REFERRING PROVIDER: Margaret Eduard SAUNDERS, MD  END OF SESSION:  PT End of Session - 04/09/24 1535     Visit Number 5    Number of Visits 13    Date for PT Re-Evaluation 05/26/24    Authorization Type MEDICARE PART A AND B    PT Start Time 1533    PT Stop Time 1612    PT Time Calculation (min) 39 min    Equipment Utilized During Treatment Gait belt    Activity Tolerance Patient tolerated treatment well    Behavior During Therapy WFL for tasks assessed/performed          Past Medical History:  Diagnosis Date   Anemia    Aortic atherosclerosis (HCC)    CT chest   Arthritis    Breast cancer (HCC) 09/29/2011   s/p radiation   Hypertension    Ovarian cancer (HCC) 09/29/2011   s/p chemo, radiation   Pneumonia    Radiation 01/14/2009-02/10/2009   left breast 45 Gy, boosted to 5 Gy   Seasonal allergies    Vitamin D  deficiency    Past Surgical History:  Procedure Laterality Date   ABDOMINAL HYSTERECTOMY  2006   APPENDECTOMY     BLADDER SURGERY     BREAST LUMPECTOMY     BREAST LUMPECTOMY WITH RADIOACTIVE SEED LOCALIZATION Right 04/07/2015   Procedure: BREAST LUMPECTOMY WITH RADIOACTIVE SEED LOCALIZATION;  Surgeon: Debby Shipper, MD;  Location: [facility];  Service: General;  Laterality: Right;   BUBBLE STUDY  02/20/2019   Procedure: BUBBLE STUDY;  Surgeon: Francyne Headland, MD;  Location: [facility];  Service: Cardiovascular;;   LOOP RECORDER INSERTION N/A 02/20/2019   Procedure: LOOP RECORDER INSERTION;  Surgeon: Francyne Headland, MD;  Location: [facility];  Service: Cardiovascular;  Laterality: N/A;   PORT-A-CATH REMOVAL N/A 07/13/2015   Procedure: REMOVAL PORT-A-CATH;  Surgeon: Debby Shipper, MD;  Location: [facility];  Service: General;  Laterality: N/A;   TEE  WITHOUT CARDIOVERSION N/A 02/20/2019   Procedure: TRANSESOPHAGEAL ECHOCARDIOGRAM (TEE) WITH IMPLANTABLE LOOP RECORDER;  Surgeon: Francyne Headland, MD;  Location: [facility];  Service: Cardiovascular;  Laterality: N/A;   TONSILLECTOMY     TRANSTHORACIC ECHOCARDIOGRAM  09/2018   Poor quality study.  Mild LVH.  EF 55 to 60%.  No R WMA.  GR 1 DD.  Normal valves.   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   Breast cancer of lower-inner quadrant of right female breast (HCC) 04/20/2015   Environmental and seasonal allergies 01/02/2015   Hx of colonic polyps 01/02/2015   Thrombocytopenia (HCC) 01/02/2015   Past use of tobacco 01/02/2015   Port-A-Cath in place 01/02/2015   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   Pneumonia 10/08/2014   Essential hypertension 10/08/2014   CAP (community  acquired pneumonia)    Genetic testing 10/07/2014   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   DOE (dyspnea on exertion) 07/20/2014   SOB (shortness of breath) 10/29/2011   Pneumonitis due to fumes and vapors (HCC) 10/29/2011   Acute bronchitis with bronchiectasis (HCC) 10/29/2011   Tobacco use disorder 10/29/2011   Hypertension 10/29/2011   Ovarian cancer (HCC) 09/29/2011    ONSET DATE: 03/11/2024  REFERRING DIAG: M62.81 (ICD-10-CM) - Muscle weakness  THERAPY DIAG:  Muscle weakness (generalized)  Unsteadiness on feet  Cervicalgia  Rationale for  Evaluation and Treatment: Rehabilitation  SUBJECTIVE:                                                                                                                                                                                             SUBJECTIVE STATEMENT:  Had some neck pain, thinks it because she was looking down too much at the newspaper. Reports it is feeling ok. Other than that, was not having any pain.    Pt accompanied by: self and    PERTINENT HISTORY: PMH: HTN, hx of breast cancer, hx of ovarian cancer, hx of TIA, CKD stage 3  Per Dr. Margaret: reports she has been having weakness in upper legs for about 3 months, weakness has progressed overtime. She has not had any falls, but does have imbalance and impaired gait.   PAIN:  Are you having pain? No Depends on how she moves her head, can't move her neck too fast, feels like the bones are rubbing up against one another  When pain happens it goes get up to an 8/10   LUE in sitting prior to interventions: Vitals:   04/09/24 1538  BP: 124/61  Pulse: 67      PRECAUTIONS: Fall   FALLS: Has patient fallen in last 6 months? No and but has had some close calls   LIVING ENVIRONMENT: Lives with: lives with their son Lives in: House/apartment Stairs: No Has following equipment at home: Grab bars  PLOF: Independent and Leisure: going out to eat with friends, [facility] aerobic [facility]   PATIENT GOALS: Wants to walk and get rid of the weakness in her knees.   OBJECTIVE:  Note: Objective measures were completed at Evaluation unless otherwise noted.  DIAGNOSTIC FINDINGS: 09/28/18 MRI brain [I reviewed images myself and agree with interpretation. -VRP]  1. No acute or subacute infarct to explain the patient's symptoms. 2. Normal and stable MRI appearance of the brain for age. 3. Mild sinus disease appears chronic. 4. Degenerative changes within the upper cervical spine.   02/24/22 MRI brain  1. No acute intracranial abnormality. 2. Findings of chronic small vessel ischemia.  MRI cervical spine 03/19/24: IMPRESSION:   This MRI of the cervical spine without contrast shows the following: The spinal cord is normal signal. Multilevel degenerative changes are noted.  No severe spinal stenosis is noted.  There is mild spinal stenosis at C3-C4 and t C4-C5, and mild to moderate spinal stenosis at C5-C6 and C6-C7. Multilevel foraminal narrowing.  There is potential for right C4 and left C5 and right C6 nerve root compression.    COGNITION: Overall cognitive status: Within functional limits for tasks assessed   SENSATION: Light touch: Impaired  and pt reports harder to detect at ankles LLE>RLE   COORDINATION: Heel to shin: WNL    POSTURE: rounded shoulders  LOWER EXTREMITY ROM:    Limited cervical AROM due to pain  LOWER EXTREMITY MMT:    MMT Right Eval Left Eval  Hip flexion 3- 4-  Hip extension    Hip abduction 4 4  Hip adduction 4+ 4+  Hip internal rotation    Hip external rotation    Knee flexion 4+ 4+  Knee extension 3+ 4+  Ankle dorsiflexion 5 5  Ankle plantarflexion    Ankle inversion    Ankle eversion    (Blank rows = not tested)  All tested in sitting   BED MOBILITY:  Pt reports no difficulties   TRANSFERS: Sit to stand: SBA  Assistive device utilized: None     Stand to sit: SBA  Assistive device utilized: None      Able to perform with no UE support during testing, but pt tends to perform with pressing up with BUE support from chair due to weakness   GAIT: Findings: Gait Characteristics: decreased stride length and narrow BOS, Distance walked: Clinic distances, Assistive device utilized:None, Level of assistance: Modified independence and SBA, and Comments: pt wearing sandals with the wedge coming off, so pt's shoes were flopping around when she was walking   FUNCTIONAL TESTS:  5 times sit to stand: 49.4 seconds with no UE support, pt reporting  incr knee pain  30 seconds chair stand test: 3 sit <> stands (below age related norms)                                                                                                                              TREATMENT DATE: 04/09/24  Therapeutic Activity:  Discussed when reading something, bringing it to eye level to decr stress on the neck compared to looking down, pt verbalized understanding as looking down too much at the news paper made her neck hurt   Pt reporting again that sometimes when she looks up she feels like she might faint, so tends not to look up and pt's eyes just mainly look up. Asked if therapy can address this. Discussed again with pt to make sure that she tells her PCP about this as this is not something that PT can  address.   Vitals:   04/09/24 1538  BP: 124/61  Pulse: 67   Assessed vitals and WNL   SciFit with BUE/BLE at Gear 2.5 Multi-peaks for 8 minutes for strengthening, circulation, improved ROM prior to activity. Pt reporting she feels like she has been running, but feels like she can tolerate this resistance.  10 reps sit <> stands with no UE support and tossing and catching 4# medicine ball to floor, pt reporting afterwards that it was strenuous   NMR:  With 6 blaze pods (3 on first step, 3 on 2nd step) to work on weight shifting, SLS, foot clearance, reaction times. trying to perform with no UE support, CGA as needed for balance, alternating between feet  Performed 2 bouts of 1 minute each on level ground: 31 hits, 27 hits Performed 2 bouts of 1 minute each on air ex: 19 hits, 25 hits  On air ex: Feet hip width with EC: 30 seconds, more narrow BOS 30 seconds, feet together 2 x 30 seconds with incr postural sway, but able to maintain balance  PATIENT EDUCATION: Education details: Continue HEP  Person educated: Patient Education method: Explanation Education comprehension: verbalized understanding and needs further education  HOME EXERCISE  PROGRAM: Access Code: [email or web address] URL: [email or web address] Date: 04/02/2024 Prepared by: Daved Bull  Exercises - Seated Upper Trapezius Stretch  - 2 x daily - 7 x weekly - 3 sets - 30 hold - Sit to Stand  - 1 x daily - 7 x weekly - 2 sets - 8-10 reps - Seated Assisted Cervical Rotation with Towel  - 1 x daily - 4 x weekly - 1-2 sets - 10 reps - Mid-Lower Cervical Extension SNAG with Strap  - 1 x daily - 4 x weekly - 1-2 sets - 10 reps - Scapular retraction with resistance  - 1 x daily - 4 x weekly - 1-2 sets - 10 reps - Seated Shoulder Diagonal Pulls with Resistance  - 1 x daily - 4 x weekly - 2 sets - 10 reps  GOALS: Goals reviewed with patient? Yes  SHORT TERM GOALS: Target date: 04/17/2024  Pt will be independent with initial HEP in order to build upon functional gains made in therapy. Baseline: Goal status: INITIAL  2.  FGA to be assessed with LTG written.  Baseline: LTG WRITTEN Goal status: MET  3.  NDI to be assessed with LTG written for neck pain Baseline: 10/50 = 20%, mild disability Goal status: MET  4.  Pt will improve 5x sit<>stand to less than or equal to 35 sec to demonstrate improved functional strength and transfer efficiency.  Baseline: 49.4 seconds with no UE support, pt reporting incr knee pain  Goal status: INITIAL  5.  Gait speed to be assessed with STG/LTG written Baseline: LTG written Goal status: MET   LONG TERM GOALS: Target date: 05/08/2024  Pt will be independent with final HEP in order to build upon functional gains made in therapy. Baseline:  Goal status: INITIAL  2.  Pt will improve FGA to at least a 21/30 in order to demo decr fall risk.  Baseline: 17/30 Goal status: INITIAL  3.  Pt will decr NDI to 6/50 or less in order to demo decr neck pain.  Baseline: 10/50 = 20%, mild disability Goal status: INITIAL  4.  Pt will improve gait speed with no AD to at least 2.6 ft/sec in order to demo improved community mobility.    Baseline: Gait speed: 15 seconds with no AD =  2.19 ft/sec Goal status: INITIAL  5.  Pt improve 30 second chair stand to at least 6 sit <> stands with no UE support for improved functional strength/endurance.  Baseline:  3 sit <> stands (below age related norms) Goal status: INITIAL    ASSESSMENT:  CLINICAL IMPRESSION: Pt overall reports improvements in neck pain and ROM since working on her HEP. Today's skilled session focused on BLE strengthening and balance on compliant surfaces. Pt initially more unsteady on compliant surfaces, but does improve with incr reps with SLS and EC tasks. Pt most challenged by sit <> stands with no UE support and fatigues easily with this. Will continue per POC.   OBJECTIVE IMPAIRMENTS: Abnormal gait, decreased activity tolerance, decreased balance, decreased knowledge of use of DME, decreased mobility, difficulty walking, decreased ROM, decreased strength, dizziness, hypomobility, increased muscle spasms, impaired flexibility, impaired sensation, postural dysfunction, and pain.   ACTIVITY LIMITATIONS: stairs, transfers, and locomotion level  PARTICIPATION LIMITATIONS: driving, shopping, and community activity  PERSONAL FACTORS: Age, Behavior pattern, Past/current experiences, Time since onset of injury/illness/exacerbation, and 3+ comorbidities: HTN, hx of breast cancer, hx of ovarian cancer, hx of TIA, CKD stage 3 are also affecting patient's functional outcome.   REHAB POTENTIAL: Good  CLINICAL DECISION MAKING: Evolving/moderate complexity  EVALUATION COMPLEXITY: Moderate  PLAN:  PT FREQUENCY: 2x/week  PT DURATION: 8 weeks  PLANNED INTERVENTIONS: [id number]- PT Re-evaluation, 97110-Therapeutic exercises, 97530- Therapeutic activity, 97112- Neuromuscular re-education, 97535- Self Care, [id number]- Manual therapy, [phone]- Gait training, [phone]- Aquatic Therapy, Patient/Family education, Balance training, Stair training, Vestibular training, and DME  instructions  PLAN FOR NEXT SESSION: check BP,   add to HEP FOR BALANCE!! BLE strength (esp hip strength), balance tasks, standing rows, lat pulldowns, wall slides, anti-rotation tasks  Work on neck stretching/ROM as needed    [place]ffield LOISE Senate, PT, DPT 04/09/2024, 4:12 PM

## 2024-04-14 ENCOUNTER — Ambulatory Visit: Admitting: Physical Therapy

## 2024-04-14 ENCOUNTER — Encounter: Payer: Self-pay | Admitting: Physical Therapy

## 2024-04-14 VITALS — BP 120/57 | HR 76

## 2024-04-14 DIAGNOSIS — M542 Cervicalgia: Secondary | ICD-10-CM

## 2024-04-14 DIAGNOSIS — R2681 Unsteadiness on feet: Secondary | ICD-10-CM | POA: Diagnosis not present

## 2024-04-14 DIAGNOSIS — M6281 Muscle weakness (generalized): Secondary | ICD-10-CM

## 2024-04-14 NOTE — Patient Instructions (Signed)
-   Corner Balance Feet Together With Eyes Closed  - 1 x daily - 4 x weekly - 1 sets - 2-3 reps - 30 seconds hold - Standing with Feet Together on Foam Pad  - 1 x daily - 4 x weekly - 1 sets - 2-3 reps - 30 seconds hold - Tandem Walking with Counter Support  - 1 x daily - 4 x weekly - 3 sets - 10 reps - Side Stepping with Resistance at Thighs and Counter Support  - 1 x daily - 4 x weekly - 3 sets - 10 reps

## 2024-04-14 NOTE — Therapy (Signed)
 OUTPATIENT PHYSICAL THERAPY NEURO TREATMENT   Patient Name: Monique Watson MRN: [id number] DOB:1941/03/06, 83 y.o., female Today's Date: 04/14/2024   PCP: Cleotilde Planas, MD  REFERRING PROVIDER: Margaret Eduard SAUNDERS, MD  END OF SESSION:  PT End of Session - 04/14/24 1113     Visit Number 6    Number of Visits 13    Date for PT Re-Evaluation 05/26/24    Authorization Type MEDICARE PART A AND B    PT Start Time 1105    PT Stop Time 1148    PT Time Calculation (min) 43 min    Equipment Utilized During Treatment Gait belt    Activity Tolerance Patient tolerated treatment well    Behavior During Therapy WFL for tasks assessed/performed          Past Medical History:  Diagnosis Date   Anemia    Aortic atherosclerosis (HCC)    CT chest   Arthritis    Breast cancer (HCC) 09/29/2011   s/p radiation   Hypertension    Ovarian cancer (HCC) 09/29/2011   s/p chemo, radiation   Pneumonia    Radiation 01/14/2009-02/10/2009   left breast 45 Gy, boosted to 5 Gy   Seasonal allergies    Vitamin D  deficiency    Past Surgical History:  Procedure Laterality Date   ABDOMINAL HYSTERECTOMY  2006   APPENDECTOMY     BLADDER SURGERY     BREAST LUMPECTOMY     BREAST LUMPECTOMY WITH RADIOACTIVE SEED LOCALIZATION Right 04/07/2015   Procedure: BREAST LUMPECTOMY WITH RADIOACTIVE SEED LOCALIZATION;  Surgeon: Debby Shipper, MD;  Location: [facility];  Service: General;  Laterality: Right;   BUBBLE STUDY  02/20/2019   Procedure: BUBBLE STUDY;  Surgeon: Francyne Headland, MD;  Location: [facility];  Service: Cardiovascular;;   LOOP RECORDER INSERTION N/A 02/20/2019   Procedure: LOOP RECORDER INSERTION;  Surgeon: Francyne Headland, MD;  Location: [facility];  Service: Cardiovascular;  Laterality: N/A;   PORT-A-CATH REMOVAL N/A 07/13/2015   Procedure: REMOVAL PORT-A-CATH;  Surgeon: Debby Shipper, MD;  Location: [facility];  Service: General;  Laterality: N/A;   TEE  WITHOUT CARDIOVERSION N/A 02/20/2019   Procedure: TRANSESOPHAGEAL ECHOCARDIOGRAM (TEE) WITH IMPLANTABLE LOOP RECORDER;  Surgeon: Francyne Headland, MD;  Location: [facility];  Service: Cardiovascular;  Laterality: N/A;   TONSILLECTOMY     TRANSTHORACIC ECHOCARDIOGRAM  09/2018   Poor quality study.  Mild LVH.  EF 55 to 60%.  No R WMA.  GR 1 DD.  Normal valves.   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   Breast cancer of lower-inner quadrant of right female breast (HCC) 04/20/2015   Environmental and seasonal allergies 01/02/2015   Hx of colonic polyps 01/02/2015   Thrombocytopenia (HCC) 01/02/2015   Past use of tobacco 01/02/2015   Port-A-Cath in place 01/02/2015   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   Pneumonia 10/08/2014   Essential hypertension 10/08/2014   CAP (community  acquired pneumonia)    Genetic testing 10/07/2014   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   DOE (dyspnea on exertion) 07/20/2014   SOB (shortness of breath) 10/29/2011   Pneumonitis due to fumes and vapors (HCC) 10/29/2011   Acute bronchitis with bronchiectasis (HCC) 10/29/2011   Tobacco use disorder 10/29/2011   Hypertension 10/29/2011   Ovarian cancer (HCC) 09/29/2011    ONSET DATE: 03/11/2024  REFERRING DIAG: M62.81 (ICD-10-CM) - Muscle weakness  THERAPY DIAG:  Muscle weakness (generalized)  Unsteadiness on feet  Cervicalgia  Rationale for  Evaluation and Treatment: Rehabilitation  SUBJECTIVE:                                                                                                                                                                                             SUBJECTIVE STATEMENT:  She has been careful with her neck so it has not been bothering her.  When it does bother her it's usually with looking down.  No falls or acute changes.    Pt accompanied by: self and    PERTINENT HISTORY: PMH: HTN, hx of breast cancer, hx of ovarian cancer, hx of TIA, CKD stage 3  Per Dr. Margaret: reports she has been having weakness in upper legs for about 3 months, weakness has progressed overtime. She has not had any falls, but does have imbalance and impaired gait.   PAIN:  Are you having pain? No Depends on how she moves her head, can't move her neck too fast, feels like the bones are rubbing up against one another  When pain happens it goes get up to an 8/10   LUE in sitting prior to interventions: Vitals:   04/14/24 1110  BP: (!) 120/57  Pulse: 76      PRECAUTIONS: Fall   FALLS: Has patient fallen in last 6 months? No and but has had some close calls   LIVING ENVIRONMENT: Lives with: lives with their son Lives in: House/apartment Stairs: No Has following equipment at home: Grab bars  PLOF: Independent and Leisure: going out to eat with friends, goes [facility] aerobic [facility] 2x a [facility] the senior center   PATIENT GOALS: Wants to walk and get rid of the weakness in her knees.   OBJECTIVE:  Note: Objective measures were completed at Evaluation unless otherwise noted.  DIAGNOSTIC FINDINGS: 09/28/18 MRI brain [I reviewed images myself and agree with interpretation. -VRP]  1. No acute or subacute infarct to explain the patient's symptoms. 2. Normal and stable MRI appearance of the brain for age. 3. Mild sinus disease appears chronic. 4. Degenerative changes within the upper cervical spine.   02/24/22  MRI  brain  1. No acute intracranial abnormality. 2. Findings of chronic small vessel ischemia.  MRI cervical spine 03/19/24: IMPRESSION:   This MRI of the cervical spine without contrast shows the following: The spinal cord is normal signal. Multilevel degenerative changes are noted.  No severe spinal stenosis is noted.  There is mild spinal stenosis at C3-C4 and t C4-C5, and mild to moderate spinal stenosis at C5-C6 and C6-C7. Multilevel foraminal narrowing.  There is potential for right C4 and left C5 and right C6 nerve root compression.    COGNITION: Overall cognitive status: Within functional limits for tasks assessed   SENSATION: Light touch: Impaired  and pt reports harder to detect at ankles LLE>RLE   COORDINATION: Heel to shin: WNL    POSTURE: rounded shoulders  LOWER EXTREMITY ROM:    Limited cervical AROM due to pain  LOWER EXTREMITY MMT:    MMT Right Eval Left Eval  Hip flexion 3- 4-  Hip extension    Hip abduction 4 4  Hip adduction 4+ 4+  Hip internal rotation    Hip external rotation    Knee flexion 4+ 4+  Knee extension 3+ 4+  Ankle dorsiflexion 5 5  Ankle plantarflexion    Ankle inversion    Ankle eversion    (Blank rows = not tested)  All tested in sitting   BED MOBILITY:  Pt reports no difficulties   TRANSFERS: Sit to stand: SBA  Assistive device utilized: None     Stand to sit: SBA  Assistive device utilized: None      Able to perform with no UE support during testing, but pt tends to perform with pressing up with BUE support from chair due to weakness   GAIT: Findings: Gait Characteristics: decreased stride length and narrow BOS, Distance walked: Clinic distances, Assistive device utilized:None, Level of assistance: Modified independence and SBA, and Comments: pt wearing sandals with the wedge coming off, so pt's shoes were flopping around when she was walking   FUNCTIONAL TESTS:  5 times sit to stand: 49.4 seconds with no UE support, pt  reporting incr knee pain  30 seconds chair stand test: 3 sit <> stands (below age related norms)                                                                                                                              TREATMENT DATE: 04/14/24  Therapeutic Activity:   Vitals:   04/14/24 1110  BP: (!) 120/57  Pulse: 76   Assessed LUE vitals and WNL prior to skilled interventions.  SciFit in hill mode up to level 5.0 using BUE/BLE for 8 minutes for strengthening, general conditioning, and activity tolerance. Pt unable to provide formal RPE but reports I'm working, especially in the legs!  NMR: Corner balance feet together eyes open x45 seconds SBA no sway Corner Balance Feet Together With Eyes Closed  - 2x30 seconds w/ 1 minute rest between due  to feeling of fatigue following effort, mild sway, SBA Standing with Feet Together on Foam Pad  - 2x30 seconds w/ 30 sec rest in between, SBA, mild-moderate sway Tandem Walking with Counter Support  4x10 ft  TherEx: Side Stepping with Red band Resistance at Thighs and Counter Support  4x10 ft Seated march over 2x10 each side, cues to improve hip flexion over circumduction  PATIENT EDUCATION: Education details: Continue HEP w/ additions for balance. Person educated: Patient Education method: Explanation Education comprehension: verbalized understanding and needs further education  HOME EXERCISE PROGRAM: Access Code: [email or web address] URL: [email or web address] Date: 04/02/2024 Prepared by: Daved Bull  Exercises - Seated Upper Trapezius Stretch  - 2 x daily - 7 x weekly - 3 sets - 30 hold - Sit to Stand  - 1 x daily - 7 x weekly - 2 sets - 8-10 reps - Seated Assisted Cervical Rotation with Towel  - 1 x daily - 4 x weekly - 1-2 sets - 10 reps - Mid-Lower Cervical Extension SNAG with Strap  - 1 x daily - 4 x weekly - 1-2 sets - 10 reps - Scapular retraction with resistance  - 1 x daily - 4 x weekly - 1-2 sets - 10 reps -  Seated Shoulder Diagonal Pulls with Resistance  - 1 x daily - 4 x weekly - 2 sets - 10 reps - Corner Balance Feet Together With Eyes Closed  - 1 x daily - 4 x weekly - 1 sets - 2-3 reps - 30 seconds hold - Standing with Feet Together on Foam Pad  - 1 x daily - 4 x weekly - 1 sets - 2-3 reps - 30 seconds hold - Tandem Walking with Counter Support  - 1 x daily - 4 x weekly - 3 sets - 10 reps - Side Stepping with Resistance at Thighs and Counter Support  - 1 x daily - 4 x weekly - 3 sets - 10 reps  GOALS: Goals reviewed with patient? Yes  SHORT TERM GOALS: Target date: 04/17/2024  Pt will be independent with initial HEP in order to build upon functional gains made in therapy. Baseline: Goal status: INITIAL  2.  FGA to be assessed with LTG written.  Baseline: LTG WRITTEN Goal status: MET  3.  NDI to be assessed with LTG written for neck pain Baseline: 10/50 = 20%, mild disability Goal status: MET  4.  Pt will improve 5x sit<>stand to less than or equal to 35 sec to demonstrate improved functional strength and transfer efficiency.  Baseline: 49.4 seconds with no UE support, pt reporting incr knee pain  Goal status: INITIAL  5.  Gait speed to be assessed with STG/LTG written Baseline: LTG written Goal status: MET   LONG TERM GOALS: Target date: 05/08/2024  Pt will be independent with final HEP in order to build upon functional gains made in therapy. Baseline:  Goal status: INITIAL  2.  Pt will improve FGA to at least a 21/30 in order to demo decr fall risk.  Baseline: 17/30 Goal status: INITIAL  3.  Pt will decr NDI to 6/50 or less in order to demo decr neck pain.  Baseline: 10/50 = 20%, mild disability Goal status: INITIAL  4.  Pt will improve gait speed with no AD to at least 2.6 ft/sec in order to demo improved community mobility.   Baseline: Gait speed: 15 seconds with no AD = 2.19 ft/sec Goal status: INITIAL  5.  Pt improve 30 second chair stand to  at least 6 sit <>  stands with no UE support for improved functional strength/endurance.  Baseline:  3 sit <> stands (below age related norms) Goal status: INITIAL    ASSESSMENT:  CLINICAL IMPRESSION: BP remains safe for therapy this visit and neck pain remains low even during activity though no activity having cervical focus this visit.  She is challenged by static balance tasks this visit so added these and further strength and dynamic balance challenges to her HEP.  She continues to be challenged by [employer] task, but tolerates a higher resistance this visit.  She is demonstrating progress towards goals and may benefit from revisiting gentle cervical mobility at next session to compliment balance work and optimize functional mobility.  Continue per POC.  OBJECTIVE IMPAIRMENTS: Abnormal gait, decreased activity tolerance, decreased balance, decreased knowledge of use of DME, decreased mobility, difficulty walking, decreased ROM, decreased strength, dizziness, hypomobility, increased muscle spasms, impaired flexibility, impaired sensation, postural dysfunction, and pain.   ACTIVITY LIMITATIONS: stairs, transfers, and locomotion level  PARTICIPATION LIMITATIONS: driving, shopping, and community activity  PERSONAL FACTORS: Age, Behavior pattern, Past/current experiences, Time since onset of injury/illness/exacerbation, and 3+ comorbidities: HTN, hx of breast cancer, hx of ovarian cancer, hx of TIA, CKD stage 3 are also affecting patient's functional outcome.   REHAB POTENTIAL: Good  CLINICAL DECISION MAKING: Evolving/moderate complexity  EVALUATION COMPLEXITY: Moderate  PLAN:  PT FREQUENCY: 2x/week  PT DURATION: 8 weeks  PLANNED INTERVENTIONS: 97164- PT Re-evaluation, 97110-Therapeutic exercises, 97530- Therapeutic activity, 97112- Neuromuscular re-education, 97535- Self Care, [id number]- Manual therapy, [phone]- Gait training, [phone]- Aquatic Therapy, Patient/Family education, Balance training, Stair training,  Vestibular training, and DME instructions  PLAN FOR NEXT SESSION: check BP,   add to HEP FOR BALANCE!! BLE strength (esp hip strength), balance tasks, standing rows, lat pulldowns, wall slides, anti-rotation tasks  May be discharging at 7/30 visit if her trip lasts >/=30 days - did she ask grandson length of time?  She is leaving this Saturday for at least 2 weeks if not longer and wants to cancel remaining visits at next visit after confirming plans with grandson.  Can go on hold and recert on return if </=30 days.  Work on neck stretching/ROM as needed    Daved KATHEE Bull, PT, DPT 04/14/2024, 12:03 PM

## 2024-04-15 ENCOUNTER — Encounter (HOSPITAL_COMMUNITY): Payer: Self-pay

## 2024-04-15 ENCOUNTER — Emergency Department (HOSPITAL_COMMUNITY)
Admission: EM | Admit: 2024-04-15 | Discharge: 2024-04-16 | Discharge status: Against Medical Advice | Attending: Emergency Medicine | Admitting: Emergency Medicine

## 2024-04-15 ENCOUNTER — Other Ambulatory Visit: Payer: Self-pay

## 2024-04-15 DIAGNOSIS — Z5321 Procedure and treatment not carried out due to patient leaving prior to being seen by health care provider: Secondary | ICD-10-CM | POA: Insufficient documentation

## 2024-04-15 DIAGNOSIS — R0602 Shortness of breath: Secondary | ICD-10-CM | POA: Diagnosis not present

## 2024-04-15 DIAGNOSIS — R531 Weakness: Secondary | ICD-10-CM | POA: Diagnosis not present

## 2024-04-15 DIAGNOSIS — R5383 Other fatigue: Secondary | ICD-10-CM | POA: Diagnosis not present

## 2024-04-15 LAB — COMPREHENSIVE METABOLIC PANEL WITH GFR
ALT: 22 U/L (ref 0–44)
AST: 33 U/L (ref 15–41)
Albumin: 3.1 g/dL — ABNORMAL LOW (ref 3.5–5.0)
Alkaline Phosphatase: 50 U/L (ref 38–126)
Anion gap: 10 (ref 5–15)
BUN: 23 mg/dL (ref 8–23)
CO2: 19 mmol/L — ABNORMAL LOW (ref 22–32)
Calcium: 8.9 mg/dL (ref 8.9–10.3)
Chloride: 109 mmol/L (ref 98–111)
Creatinine, Ser: 1.55 mg/dL — ABNORMAL HIGH (ref 0.44–1.00)
GFR, Estimated: 33 mL/min — ABNORMAL LOW (ref >60)
Glucose, Bld: 104 mg/dL — ABNORMAL HIGH (ref 70–99)
Potassium: 3.6 mmol/L (ref 3.5–5.1)
Sodium: 138 mmol/L (ref 135–145)
Total Bilirubin: 0.5 mg/dL (ref 0.0–1.2)
Total Protein: 7.1 g/dL (ref 6.5–8.1)

## 2024-04-15 LAB — URINALYSIS, ROUTINE W REFLEX MICROSCOPIC
Bilirubin Urine: NEGATIVE
Glucose, UA: NEGATIVE mg/dL
Hgb urine dipstick: NEGATIVE
Ketones, ur: NEGATIVE mg/dL
Leukocytes,Ua: NEGATIVE
Nitrite: NEGATIVE
Protein, ur: NEGATIVE mg/dL
Specific Gravity, Urine: 1.01 (ref 1.005–1.030)
pH: 6 (ref 5.0–8.0)

## 2024-04-15 LAB — CBC
HCT: 31.1 % — ABNORMAL LOW (ref 36.0–46.0)
Hemoglobin: 10.5 g/dL — ABNORMAL LOW (ref 12.0–15.0)
MCH: 33.5 pg (ref 26.0–34.0)
MCHC: 33.8 g/dL (ref 30.0–36.0)
MCV: 99.4 fL (ref 80.0–100.0)
Platelets: 106 K/uL — ABNORMAL LOW (ref 150–400)
RBC: 3.13 MIL/uL — ABNORMAL LOW (ref 3.87–5.11)
RDW: 13.1 % (ref 11.5–15.5)
WBC: 5.2 K/uL (ref 4.0–10.5)
nRBC: 0 % (ref 0.0–0.2)

## 2024-04-15 LAB — CBG MONITORING, ED: Glucose-Capillary: 100 mg/dL — ABNORMAL HIGH (ref 70–99)

## 2024-04-15 NOTE — ED Triage Notes (Signed)
 Patient c/o fatigue and weakness for several days, this morning she was unable to stand and needed help getting ready. Patient reports this has happened before and she needed fluids. Patient reports no pain.

## 2024-04-16 ENCOUNTER — Ambulatory Visit: Admitting: Physical Therapy

## 2024-04-16 NOTE — ED Notes (Signed)
 Pt left the waiting area. Pt does not want to wait any longer to see the doctor

## 2024-04-17 ENCOUNTER — Emergency Department (HOSPITAL_BASED_OUTPATIENT_CLINIC_OR_DEPARTMENT_OTHER)
Admission: EM | Admit: 2024-04-17 | Discharge: 2024-04-17 | Disposition: A | Discharge status: Home / Self Care | Source: Ambulatory Visit | Attending: Emergency Medicine | Admitting: Emergency Medicine

## 2024-04-17 ENCOUNTER — Encounter (HOSPITAL_BASED_OUTPATIENT_CLINIC_OR_DEPARTMENT_OTHER): Payer: Self-pay

## 2024-04-17 ENCOUNTER — Other Ambulatory Visit: Payer: Self-pay

## 2024-04-17 DIAGNOSIS — Z79899 Other long term (current) drug therapy: Secondary | ICD-10-CM | POA: Insufficient documentation

## 2024-04-17 DIAGNOSIS — E860 Dehydration: Secondary | ICD-10-CM | POA: Insufficient documentation

## 2024-04-17 DIAGNOSIS — Z853 Personal history of malignant neoplasm of breast: Secondary | ICD-10-CM | POA: Diagnosis not present

## 2024-04-17 DIAGNOSIS — Z7982 Long term (current) use of aspirin: Secondary | ICD-10-CM | POA: Diagnosis not present

## 2024-04-17 DIAGNOSIS — I10 Essential (primary) hypertension: Secondary | ICD-10-CM | POA: Diagnosis not present

## 2024-04-17 DIAGNOSIS — R42 Dizziness and giddiness: Secondary | ICD-10-CM | POA: Diagnosis not present

## 2024-04-17 DIAGNOSIS — I700 Atherosclerosis of aorta: Secondary | ICD-10-CM | POA: Diagnosis not present

## 2024-04-17 DIAGNOSIS — J479 Bronchiectasis, uncomplicated: Secondary | ICD-10-CM | POA: Diagnosis not present

## 2024-04-17 LAB — COMPREHENSIVE METABOLIC PANEL WITH GFR
ALT: 21 U/L (ref 0–44)
AST: 36 U/L (ref 15–41)
Albumin: 3.7 g/dL (ref 3.5–5.0)
Alkaline Phosphatase: 57 U/L (ref 38–126)
Anion gap: 9 (ref 5–15)
BUN: 22 mg/dL (ref 8–23)
CO2: 24 mmol/L (ref 22–32)
Calcium: 9.2 mg/dL (ref 8.9–10.3)
Chloride: 109 mmol/L (ref 98–111)
Creatinine, Ser: 1.69 mg/dL — ABNORMAL HIGH (ref 0.44–1.00)
GFR, Estimated: 29 mL/min — ABNORMAL LOW (ref >60)
Glucose, Bld: 121 mg/dL — ABNORMAL HIGH (ref 70–99)
Potassium: 4 mmol/L (ref 3.5–5.1)
Sodium: 142 mmol/L (ref 135–145)
Total Bilirubin: 0.7 mg/dL (ref 0.0–1.2)
Total Protein: 7.2 g/dL (ref 6.5–8.1)

## 2024-04-17 LAB — CBC WITH DIFFERENTIAL/PLATELET
Abs Immature Granulocytes: 0.01 K/uL (ref 0.00–0.07)
Basophils Absolute: 0 K/uL (ref 0.0–0.1)
Basophils Relative: 0 %
Eosinophils Absolute: 0.1 K/uL (ref 0.0–0.5)
Eosinophils Relative: 1 %
HCT: 29.3 % — ABNORMAL LOW (ref 36.0–46.0)
Hemoglobin: 10.2 g/dL — ABNORMAL LOW (ref 12.0–15.0)
Immature Granulocytes: 0 %
Lymphocytes Relative: 16 %
Lymphs Abs: 1 K/uL (ref 0.7–4.0)
MCH: 34 pg (ref 26.0–34.0)
MCHC: 34.8 g/dL (ref 30.0–36.0)
MCV: 97.7 fL (ref 80.0–100.0)
Monocytes Absolute: 0.6 K/uL (ref 0.1–1.0)
Monocytes Relative: 9 %
Neutro Abs: 4.6 K/uL (ref 1.7–7.7)
Neutrophils Relative %: 74 %
Platelets: 103 K/uL — ABNORMAL LOW (ref 150–400)
RBC: 3 MIL/uL — ABNORMAL LOW (ref 3.87–5.11)
RDW: 13.1 % (ref 11.5–15.5)
WBC: 6.3 K/uL (ref 4.0–10.5)
nRBC: 0 % (ref 0.0–0.2)

## 2024-04-17 MED ORDER — SODIUM CHLORIDE 0.9 % IV BOLUS
1000.0000 mL | Freq: Once | INTRAVENOUS | Status: AC
  Administered 2024-04-17: 1000 mL via INTRAVENOUS

## 2024-04-17 NOTE — Discharge Instructions (Signed)
 Continue hydration at home.  Follow-up with your primary care doctor in a couple days to have your labs rechecked to make sure things are improving.

## 2024-04-17 NOTE — ED Provider Notes (Signed)
 [place] EMERGENCY DEPARTMENT AT [facility] Provider Note   CSN: [id number] Arrival date & time: 04/17/24  1100     Patient presents with: Dizziness   Monique Watson is a 83 y.o. female.   Patient here with dizziness when she stands up.  States it goes away after she rests a little bit.  She was sent here for IV fluids by her primary care doctor.  She has a history of vertigo.  She is not having any major vertigo symptoms.  She states that when she is standing for a little bit symptoms go away.  She denies any weakness numbness tingling.  No new medications.  No nausea vomiting diarrhea.  No chest pain.  She has not passed out.  No falls.  History of hypertension ovarian and breast cancer.  The history is provided by the patient.       Prior to Admission medications   Medication Sig Start Date End Date Taking? Authorizing Provider  albuterol  (VENTOLIN  HFA) 108 (90 Base) MCG/ACT inhaler Inhale 1-2 puffs into the lungs every 4 (four) hours as needed for wheezing or shortness of breath. 12/06/23   Darlean Ozell NOVAK, MD  amLODipine  (NORVASC ) 5 MG tablet Take 5 mg by mouth daily. 01/25/22   [provider]  aspirin 81 MG tablet Take 81 mg by mouth daily.    [provider]  Calcium  Carbonate-Vit D-Min (CALCIUM  1200) 1200-1000 MG-UNIT CHEW 1 tablet with a meal    [provider]  CALCIUM  PO Take 1 tablet by mouth daily.    [provider]  Cholecalciferol  (VITAMIN D3) 2000 UNITS TABS Take 2,000 Units by mouth daily.     [provider]  docusate sodium  (COLACE) 100 MG capsule 1 capsule daily for constipation    [provider]  losartan (COZAAR) 100 MG tablet Take 1 tablet by mouth daily.    [provider]  Omega-3 Fatty Acids (FISH OIL CONCENTRATE) 1000 MG CAPS Take 1,000 mg by mouth daily.     [provider]  potassium chloride  (K-DUR,KLOR-CON ) 10 MEQ tablet Take 10 mEq by mouth daily.    [provider]  Probiotic Product (PROBIOTIC PO) Take 1 capsule by mouth daily.    [provider]  tamoxifen  (NOLVADEX ) 20 MG tablet Take 1 tablet (20 mg total) by mouth daily. 07/30/23   Gudena, Vinay, MD  vitamin E  180 MG (400 UNITS) capsule Take 400 Units by mouth daily.     [provider]    Allergies: Chlorhexidine , Arimidex [anastrozole], Diovan [valsartan], and Uncoded nonscreenable allergen    Review of Systems  Updated Vital Signs BP (!) 150/72   Pulse 60   Temp 97.7 F (36.5 C)   Resp 16   SpO2 100%   Physical Exam Vitals and nursing note reviewed.  Constitutional:      General: She is not in acute distress.    Appearance: She is well-developed. She is not ill-appearing.  HENT:     Head: Normocephalic and atraumatic.     Nose: Nose normal.     Mouth/Throat:     Mouth: Mucous membranes are moist.  Eyes:     Extraocular Movements: Extraocular movements intact.     Conjunctiva/sclera: Conjunctivae normal.     Pupils: Pupils are equal, round, and reactive to light.  Cardiovascular:     Rate and Rhythm: Normal rate and regular rhythm.     Pulses: Normal pulses.     Heart sounds: Normal  heart sounds. No murmur heard. Pulmonary:     Effort: Pulmonary effort is normal. No respiratory distress.     Breath sounds: Normal breath sounds.  Abdominal:     Palpations: Abdomen is soft.     Tenderness: There is no abdominal tenderness.  Musculoskeletal:        General: No swelling.     Cervical back: Normal range of motion and neck supple.  Skin:    General: Skin is warm and dry.     Capillary Refill: Capillary refill takes less than 2 seconds.  Neurological:     General: No focal deficit present.     Mental Status: She is alert and oriented to person, place, and time.     Cranial Nerves: No cranial nerve deficit.     Sensory: No sensory deficit.     Motor: No weakness.     Coordination: Coordination normal.     Comments: 5+ out of 5 strength  throughout, normal station, normal speech, normal visual fields, normal coordination, no nystagmus  Psychiatric:        Mood and Affect: Mood normal.     (all labs ordered are listed, but only abnormal results are displayed) Labs Reviewed  CBC WITH DIFFERENTIAL/PLATELET - Abnormal; Notable for the following components:      Result Value   RBC 3.00 (*)    Hemoglobin 10.2 (*)    HCT 29.3 (*)    Platelets 103 (*)    All other components within normal limits  COMPREHENSIVE METABOLIC PANEL WITH GFR - Abnormal; Notable for the following components:   Glucose, Bld 121 (*)    Creatinine, Ser 1.69 (*)    GFR, Estimated 29 (*)    All other components within normal limits    EKG: EKG Interpretation Date/Time:  Thursday April 17 2024 11:13:50 EDT Ventricular Rate:  66 PR Interval:  143 QRS Duration:  88 QT Interval:  411 QTC Calculation: 431 R Axis:   7  Text Interpretation: Sinus rhythm Probable left atrial enlargement Confirmed by Ruthe Cornet [phone]) on 04/17/2024 11:29:03 AM  Radiology: No results found.   Procedures   Medications Ordered in the ED  sodium chloride  0.9 % bolus 1,000 mL (1,000 mLs Intravenous New Bag/Given 04/17/24 1134)                                    Medical Decision Making Amount and/or Complexity of Data Reviewed Labs: ordered.   Monique Watson is here with lightheadedness.  Normal vitals.  No fever.  EKG shows sinus rhythm.  No ischemic changes.  She is neurologically intact.  Differential diagnosis I suspect orthostasis.  She had positive orthostatics with me in the room.  She has normal neurological exam.  This seems less likely to be vertigo.  Have no concern for stroke.  She has not had any nausea vomiting diarrhea but will check electrolytes to evaluate for dehydration.  No new medications.  I do not think this is a medication side effect.  She has been able to ambulate with her cane.  Overall we will give IV fluids check basic labs and  reevaluate.  Doubt any infectious process.  Creatinine is 1.69.  Her baseline is around 1.2.  Show she is mildly dehydrated.  She is mildly orthostatic.  She is feeling better after IV fluids.  Have no concern for stroke or other acute process.  Lab work  otherwise showed no significant leukocytosis or anemia or electrolyte abnormality.  She stable for discharge for home.  Follow-up with primary care for recheck.  Discharge.  Understands return precautions.  This chart was dictated using voice recognition software.  Despite best efforts to proofread,  errors can occur which can change the documentation meaning.      Final diagnoses:  Dehydration    ED Discharge Orders     None          Ruthe Cornet, DO 04/17/24 1257

## 2024-04-17 NOTE — ED Notes (Signed)
 Pt d/c instructions, medications, and follow-up care reviewed with pt and family. Pt verbalized understanding and had no further questions at time of d/c. Pt CA&Ox4 and in NAD at time of d/c

## 2024-04-17 NOTE — ED Triage Notes (Signed)
 Pt c/o fatigue, weakness x about a week or so, seen by PCP previously for same, & advised fluids needed. Advises dizziness upon standing, states that feeling subsides after I stand there & get my bearings.

## 2024-04-22 ENCOUNTER — Ambulatory Visit: Admitting: Physical Therapy

## 2024-04-24 ENCOUNTER — Ambulatory Visit: Admitting: Physical Therapy

## 2024-04-28 ENCOUNTER — Ambulatory Visit: Admitting: Physical Therapy

## 2024-04-28 ENCOUNTER — Telehealth: Payer: Self-pay | Admitting: Physical Therapy

## 2024-04-28 NOTE — Telephone Encounter (Signed)
 Called regarding no show appt and spoke to pt's daughter. Pt is currently out of [place] and won't be back until the end of the month of September. Will cancel all remaining of pt's visits. Discussed if pt does need to return in September, then will need to be discharged and pt will need a new referral to return as it will be >30 days since last seen and past pt's cert date. Pt's daughter verbalized understanding.  Sheffield Senate, PT, DPT 04/28/24 12:54 PM   [facility] [address] [place], [place]  [address] Phone:  [phone] Fax:  [phone]

## 2024-04-30 ENCOUNTER — Ambulatory Visit: Admitting: Physical Therapy

## 2024-05-05 ENCOUNTER — Ambulatory Visit: Admitting: Physical Therapy

## 2024-05-07 ENCOUNTER — Ambulatory Visit: Admitting: Physical Therapy

## 2024-05-07 DIAGNOSIS — J479 Bronchiectasis, uncomplicated: Secondary | ICD-10-CM | POA: Diagnosis not present

## 2024-05-07 DIAGNOSIS — I10 Essential (primary) hypertension: Secondary | ICD-10-CM | POA: Diagnosis not present

## 2024-05-07 DIAGNOSIS — I700 Atherosclerosis of aorta: Secondary | ICD-10-CM | POA: Diagnosis not present

## 2024-05-18 DIAGNOSIS — J479 Bronchiectasis, uncomplicated: Secondary | ICD-10-CM | POA: Diagnosis not present

## 2024-05-18 DIAGNOSIS — I10 Essential (primary) hypertension: Secondary | ICD-10-CM | POA: Diagnosis not present

## 2024-05-18 DIAGNOSIS — I700 Atherosclerosis of aorta: Secondary | ICD-10-CM | POA: Diagnosis not present

## 2024-05-18 DIAGNOSIS — Z853 Personal history of malignant neoplasm of breast: Secondary | ICD-10-CM | POA: Diagnosis not present

## 2024-05-27 DIAGNOSIS — L723 Sebaceous cyst: Secondary | ICD-10-CM | POA: Diagnosis not present

## 2024-05-27 DIAGNOSIS — C50311 Malignant neoplasm of lower-inner quadrant of right female breast: Secondary | ICD-10-CM | POA: Diagnosis not present

## 2024-05-27 DIAGNOSIS — Z1331 Encounter for screening for depression: Secondary | ICD-10-CM | POA: Diagnosis not present

## 2024-05-27 DIAGNOSIS — N1832 Chronic kidney disease, stage 3b: Secondary | ICD-10-CM | POA: Diagnosis not present

## 2024-05-27 DIAGNOSIS — Z23 Encounter for immunization: Secondary | ICD-10-CM | POA: Diagnosis not present

## 2024-05-27 DIAGNOSIS — Z0000 Encounter for general adult medical examination without abnormal findings: Secondary | ICD-10-CM | POA: Diagnosis not present

## 2024-05-27 DIAGNOSIS — I129 Hypertensive chronic kidney disease with stage 1 through stage 4 chronic kidney disease, or unspecified chronic kidney disease: Secondary | ICD-10-CM | POA: Diagnosis not present

## 2024-06-06 DIAGNOSIS — I10 Essential (primary) hypertension: Secondary | ICD-10-CM | POA: Diagnosis not present

## 2024-06-06 DIAGNOSIS — J479 Bronchiectasis, uncomplicated: Secondary | ICD-10-CM | POA: Diagnosis not present

## 2024-06-06 DIAGNOSIS — I700 Atherosclerosis of aorta: Secondary | ICD-10-CM | POA: Diagnosis not present

## 2024-06-17 DIAGNOSIS — J479 Bronchiectasis, uncomplicated: Secondary | ICD-10-CM | POA: Diagnosis not present

## 2024-06-17 DIAGNOSIS — I10 Essential (primary) hypertension: Secondary | ICD-10-CM | POA: Diagnosis not present

## 2024-06-17 DIAGNOSIS — Z853 Personal history of malignant neoplasm of breast: Secondary | ICD-10-CM | POA: Diagnosis not present

## 2024-06-17 DIAGNOSIS — I700 Atherosclerosis of aorta: Secondary | ICD-10-CM | POA: Diagnosis not present

## 2024-06-18 ENCOUNTER — Other Ambulatory Visit: Payer: Self-pay | Admitting: Hematology and Oncology

## 2024-07-01 DIAGNOSIS — Z1231 Encounter for screening mammogram for malignant neoplasm of breast: Secondary | ICD-10-CM | POA: Diagnosis not present

## 2024-07-01 DIAGNOSIS — M85851 Other specified disorders of bone density and structure, right thigh: Secondary | ICD-10-CM | POA: Diagnosis not present

## 2024-07-01 DIAGNOSIS — M85852 Other specified disorders of bone density and structure, left thigh: Secondary | ICD-10-CM | POA: Diagnosis not present

## 2024-07-06 DIAGNOSIS — I700 Atherosclerosis of aorta: Secondary | ICD-10-CM | POA: Diagnosis not present

## 2024-07-06 DIAGNOSIS — I10 Essential (primary) hypertension: Secondary | ICD-10-CM | POA: Diagnosis not present

## 2024-07-06 DIAGNOSIS — J479 Bronchiectasis, uncomplicated: Secondary | ICD-10-CM | POA: Diagnosis not present

## 2024-07-18 DIAGNOSIS — I10 Essential (primary) hypertension: Secondary | ICD-10-CM | POA: Diagnosis not present

## 2024-07-18 DIAGNOSIS — J479 Bronchiectasis, uncomplicated: Secondary | ICD-10-CM | POA: Diagnosis not present

## 2024-07-18 DIAGNOSIS — I700 Atherosclerosis of aorta: Secondary | ICD-10-CM | POA: Diagnosis not present

## 2024-07-18 DIAGNOSIS — Z853 Personal history of malignant neoplasm of breast: Secondary | ICD-10-CM | POA: Diagnosis not present

## 2024-07-19 DIAGNOSIS — Z23 Encounter for immunization: Secondary | ICD-10-CM | POA: Diagnosis not present

## 2024-07-21 DIAGNOSIS — D649 Anemia, unspecified: Secondary | ICD-10-CM | POA: Diagnosis not present

## 2024-07-23 DIAGNOSIS — D649 Anemia, unspecified: Secondary | ICD-10-CM | POA: Diagnosis not present

## 2024-07-29 ENCOUNTER — Inpatient Hospital Stay: Payer: Medicare Other | Attending: Hematology and Oncology | Admitting: Hematology and Oncology

## 2024-07-29 VITALS — BP 149/53 | HR 86 | Temp 99.1°F | Resp 18 | Ht 66.0 in | Wt 145.6 lb

## 2024-07-29 DIAGNOSIS — Z1721 Progesterone receptor positive status: Secondary | ICD-10-CM | POA: Diagnosis not present

## 2024-07-29 DIAGNOSIS — Z1502 Genetic susceptibility to malignant neoplasm of ovary: Secondary | ICD-10-CM | POA: Diagnosis not present

## 2024-07-29 DIAGNOSIS — C50311 Malignant neoplasm of lower-inner quadrant of right female breast: Secondary | ICD-10-CM | POA: Insufficient documentation

## 2024-07-29 DIAGNOSIS — Z79899 Other long term (current) drug therapy: Secondary | ICD-10-CM | POA: Insufficient documentation

## 2024-07-29 DIAGNOSIS — Z1732 Human epidermal growth factor receptor 2 negative status: Secondary | ICD-10-CM | POA: Diagnosis not present

## 2024-07-29 DIAGNOSIS — Z79810 Long term (current) use of selective estrogen receptor modulators (SERMs): Secondary | ICD-10-CM | POA: Insufficient documentation

## 2024-07-29 DIAGNOSIS — Z1505 Genetic susceptibility to malignant neoplasm of fallopian tube(s): Secondary | ICD-10-CM | POA: Insufficient documentation

## 2024-07-29 DIAGNOSIS — Z9221 Personal history of antineoplastic chemotherapy: Secondary | ICD-10-CM | POA: Diagnosis not present

## 2024-07-29 DIAGNOSIS — Z7982 Long term (current) use of aspirin: Secondary | ICD-10-CM | POA: Insufficient documentation

## 2024-07-29 DIAGNOSIS — Z170 Estrogen receptor positive status [ER+]: Secondary | ICD-10-CM | POA: Diagnosis not present

## 2024-07-29 DIAGNOSIS — Z923 Personal history of irradiation: Secondary | ICD-10-CM | POA: Insufficient documentation

## 2024-07-29 DIAGNOSIS — Z1501 Genetic susceptibility to malignant neoplasm of breast: Secondary | ICD-10-CM | POA: Insufficient documentation

## 2024-07-29 MED ORDER — TAMOXIFEN CITRATE 20 MG PO TABS: 20.0000 mg | ORAL_TABLET | Freq: Every day | ORAL | 3 refills | Status: AC

## 2024-07-29 NOTE — Assessment & Plan Note (Signed)
 03/2015 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 Plan to continue for 1 more year.   Breast cancer surveillance: 1.  Breast MRI 06/01/2021: Benign breast density category B 2. mammogram at [facility]: 05/30/2023.  Benign, breast density category B 3.  Breast exam 07/30/2023: Benign    CT chest: 07/01/2022: chronic MAI stigmata   Polyclonal IgG elevation: Benign process. Return to clinic in 1 year for follow-up

## 2024-07-29 NOTE — Progress Notes (Signed)
 Patient Care Team: Cleotilde Planas, MD as PCP - General (Family Medicine) Anner Alm ORN, MD as PCP - Cardiology (Cardiology) Vanderbilt Ned, MD as Consulting Physician (General Surgery) Dewey Rush, MD as Consulting Physician (Radiation Oncology) Camella Delroy SQUIBB, MD as Consulting Physician (Oncology) Moses Powell Hummer, NP as Nurse Practitioner (Hematology and Oncology)  DIAGNOSIS:  Encounter Diagnosis  Name Primary?   Malignant neoplasm of lower-inner quadrant of right breast of female, estrogen receptor positive (HCC) Yes    SUMMARY OF ONCOLOGIC HISTORY: Oncology History Overview Note  3/3/Multifocal T1N0 left breast at lumpectomy and sentinel node evaluation March 2010. ER PR +, Her2 negative. 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 Intolerant to Femara and Aromasin, Tamoxifen    08/31/2014 Procedure   Genetic testing: Mutation at BRCA2, [id number]   01/26/2015 Breast MRI   There is a 5 x 9 x 8 mm mass enhancement at the slight lower slight medial right breast anterior to middle depth with plateau enhancement kinetics.   02/09/2015 Initial Biopsy   Right breast core needle bx: negative for malignancy    02/24/2015 Procedure   Right breast core needle bx: DCIS with comedonecrosis, ER+ (95%), PR- (0%)   02/24/2015 Clinical Stage   Stage 0: Tis N0   04/07/2015 Definitive Surgery   Right lumpectomy: DCIS, intermediate grade, with necrosis; close lateral margin   04/07/2015 Pathologic Stage   Stage 0: pTis pNx    05/17/2015 - 06/14/2015 Radiation Therapy   Adjuvant RT([facility]): Right breast 42.5 Gy over 17 fractions; right breast boost 7.5 Gy over 3 fractions. Total dose: 50 Gy   09/02/2015 Survivorship   Survivorship care plan completed and mailed to patient in lieu of in person visit.     CHIEF COMPLIANT: Surveillance of breast cancer on tamoxifen  therapy  HISTORY OF PRESENT ILLNESS:   History of Present Illness Monique Watson is an 83 year old female on tamoxifen  therapy for breast cancer follow-up who presents for routine oncology follow-up.  She has been on tamoxifen  for nine years without any issues and is scheduled to continue for one more year. She has completed her mammogram for the month at [facility]. Recently, she visited the emergency room for dehydration, which she attributes to increased consumption of coffee and soda over water.     ALLERGIES:  is allergic to chlorhexidine , arimidex [anastrozole], diovan [valsartan], and uncoded nonscreenable allergen.  MEDICATIONS:  Current Outpatient Medications  Medication Sig Dispense Refill   albuterol  (VENTOLIN  HFA) 108 (90 Base) MCG/ACT inhaler Inhale 1-2 puffs into the lungs every 4 (four) hours as needed for wheezing or shortness of breath. 18 g 2   amLODipine  (NORVASC ) 5 MG tablet Take 5 mg by mouth daily.     aspirin 81 MG tablet Take 81 mg by mouth daily.     Calcium  Carbonate-Vit D-Min (CALCIUM  1200) 1200-1000 MG-UNIT CHEW 1 tablet with a meal     CALCIUM  PO Take 1 tablet by mouth daily.     Cholecalciferol  (VITAMIN D3) 2000 UNITS TABS Take 2,000 Units by mouth daily.  docusate sodium  (COLACE) 100 MG capsule 1 capsule daily for constipation     losartan (COZAAR) 100 MG tablet Take 1 tablet by mouth daily.     Omega-3 Fatty Acids (FISH OIL CONCENTRATE) 1000 MG CAPS Take 1,000 mg by mouth daily.      potassium chloride  (K-DUR,KLOR-CON ) 10 MEQ tablet Take 10 mEq by mouth daily.     Probiotic Product (PROBIOTIC PO) Take 1 capsule by  mouth daily.     tamoxifen  (NOLVADEX ) 20 MG tablet TAKE 1 TABLET BY MOUTH DAILY 90 tablet 0   vitamin E  180 MG (400 UNITS) capsule Take 400 Units by mouth daily.      No current facility-administered medications for this visit.    PHYSICAL EXAMINATION: ECOG PERFORMANCE STATUS: 1 - Symptomatic but completely ambulatory  Vitals:   07/29/24 1439  BP: (!) 149/53  Pulse: 86  Resp: 18  Temp: 99.1 F (37.3 C)  SpO2: 100%   Filed Weights   07/29/24 1439  Weight: 145 lb 9.6 oz (66 kg)    Physical Exam Breast exam: No palpable lumps or nodules in bilateral breast or axilla  (exam performed in the presence of a chaperone)  LABORATORY DATA:  I have reviewed the data as listed    Latest Ref Rng & Units 04/17/2024   11:38 AM 04/15/2024    2:24 PM 02/17/2019   11:36 AM  CMP  Glucose 70 - 99 mg/dL 878  895  87   BUN 8 - 23 mg/dL 22  23  11    Creatinine 0.44 - 1.00 mg/dL 8.30  8.44  9.09   Sodium 135 - 145 mmol/L 142  138  145   Potassium 3.5 - 5.1 mmol/L 4.0  3.6  4.0   Chloride 98 - 111 mmol/L 109  109  105   CO2 22 - 32 mmol/L 24  19  23    Calcium  8.9 - 10.3 mg/dL 9.2  8.9  9.0   Total Protein 6.5 - 8.1 g/dL 7.2  7.1    Total Bilirubin 0.0 - 1.2 mg/dL 0.7  0.5    Alkaline Phos 38 - 126 U/L 57  50    AST 15 - 41 U/L 36  33    ALT 0 - 44 U/L 21  22      Lab Results  Component Value Date   WBC 6.3 04/17/2024   HGB 10.2 (L) 04/17/2024   HCT 29.3 (L) 04/17/2024   MCV 97.7 04/17/2024   PLT 103 (L) 04/17/2024   NEUTROABS 4.6 04/17/2024    ASSESSMENT & PLAN:  Breast cancer of lower-inner quadrant of right female breast (HCC) 03/2015 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 Plan to continue for 1 more year.   Breast cancer  surveillance: 1.  Breast MRI 06/01/2021: Benign breast density category B 2. mammogram at [facility]: 05/30/2023.  Benign, breast density category B 3.  Breast exam 07/30/2023: Benign    CT chest: 07/01/2022: chronic MAI stigmata   Polyclonal IgG elevation: Benign process. Return to clinic in 1 year for follow-up and after that she could be seen on an as-needed basis      No orders of the defined types were placed in this encounter.  The patient has a good understanding  of the overall plan. she agrees with it. she will call with any problems that may develop before the next visit here.  I personally spent a total of 30 minutes in the care of the patient today including preparing to see the patient, getting/reviewing separately obtained history, performing a medically appropriate exam/evaluation, counseling and educating, placing orders, referring and communicating with other health care professionals, documenting clinical information in the EHR, independently interpreting results, communicating results, and coordinating care.   Viinay K Corene Resnick, MD 07/29/24

## 2024-08-05 DIAGNOSIS — J479 Bronchiectasis, uncomplicated: Secondary | ICD-10-CM | POA: Diagnosis not present

## 2024-08-05 DIAGNOSIS — I10 Essential (primary) hypertension: Secondary | ICD-10-CM | POA: Diagnosis not present

## 2024-08-05 DIAGNOSIS — I700 Atherosclerosis of aorta: Secondary | ICD-10-CM | POA: Diagnosis not present

## 2024-08-11 ENCOUNTER — Ambulatory Visit: Admitting: Internal Medicine

## 2024-08-11 ENCOUNTER — Encounter: Payer: Self-pay | Admitting: Internal Medicine

## 2024-08-11 ENCOUNTER — Ambulatory Visit (INDEPENDENT_AMBULATORY_CARE_PROVIDER_SITE_OTHER)

## 2024-08-11 VITALS — BP 126/60 | HR 64 | Ht 66.0 in | Wt 145.0 lb

## 2024-08-11 DIAGNOSIS — J479 Bronchiectasis, uncomplicated: Secondary | ICD-10-CM | POA: Diagnosis not present

## 2024-08-11 MED ORDER — AZITHROMYCIN 250 MG PO TABS: ORAL_TABLET | ORAL | 11 refills | Status: AC

## 2024-08-11 NOTE — Progress Notes (Signed)
 Patient ID: Monique Watson, female   DOB: June 17, 1941,    MRN: [id number]  Brief patient profile:  25 yobf quit smoking 11/ 2018 referred to pulmonary clinic 10/22/2017 by Dr   Monique Watson for eval of bronchiectasis with nl pfts 01/21/2018    History of Present Illness  10/22/2017 1st [name]/ Monique Watson   Chief Complaint  Patient presents with   Pulmonary Consult    Referred by Dr. Almarie Monique Watson. Pt states had PNA back in Nov 2018. She is coughing with yellow sputum. She uses her albuterol  inhaler at least once per day on average.   baseline pna every other year  x 10-15 years and fine in between so fine in fact that she could ex at gym and no need for inhalers Has not returned  to gym p most recent dx of pna in Nov 2018  Uses saba each helps bring up  Maybe a tbsp beige more than yellow and never bloody / no better with a zpak Sleeps flat ok  Doe = MMRC1 = can walk nl pace, flat grade, can't hurry or go uphills or steps s sob   rec Bronchiectasis    Work on inhaler technique:   Use the flutter valve as much as you can to help you bring up mucus     06/19/2022  f/u ov/Monique Watson re: bronchiectasis no maint rx  Chief Complaint  Patient presents with   Follow-up    She c/o fatigue and dizziness over the past wk. She states sometimes she has cramps all over.    Dyspnea:  not sob but very sedentary   Cough: rattling / thick creamy mucus in am never bloody, not sued zmax  recently  Sleeping: feels fine flat s veritgo/ one pillow SABA use: none  02: none  L arm sts just distal to volar elbow with from L elbow and no swelling distally  Light head  x 1  week - says working on better bp rx with PCP with f/u this afternoon CT due now  Rec  Whenever you develop cough congestion take mucinex or mucinex dm 1200 mg every 12 hours as needed  If the mucus gets nasty >  zpak  HRCT chest  06/29/22  C/w bronchiectasis and low grade MAI    07/20/2023 12 m f/u ov/Monique Watson re:  bronchiectasis  maint on no resp rx/ has zpak not using   Chief Complaint  Patient presents with   Follow-up    Doing well.  Dyspnea:  does ex Tues /Thursday silver sneakers x 45 min Cough: slt rattle slt yellow no recent abx ,no recent change  Sleeping: flat bed / one pillow s  resp cc  SABA use: none 02: none Rec For cough/ congestion > mucinex or mucinex dm  up to maximum of  1200 mg every 12 hours as needed If mucus gets nasty > zpak refillable Please remember to go to the  x-ray department  cxr ok     08/11/2024 yeary  f/u ov/Monique Watson re:  bronchiectasis   maint on mucinex and prn zpak / no inhalers   Chief Complaint  Patient presents with   Medical Management of Chronic Issues   Recurrent Pneumonia    Breathing is overall doing well. Her cough is occ prod with cream colored sputum.    Dyspnea:  silver sneakers going fine s doe  Cough: most notable in AM's nothing nasty  Sleeping: flat bed one pillow no  resp cc  SABA use: none  02: none    No obvious day to day or daytime variability or assoc excess/ purulent sputum or mucus plugs or hemoptysis or cp or chest tightness, subjective wheeze or overt sinus or hb symptoms.    Also denies any obvious fluctuation of symptoms with weather or environmental changes or other aggravating or alleviating factors except as outlined above   No unusual exposure hx or h/o childhood pna/ asthma or knowledge of premature birth.  Current Allergies, Complete Past Medical History, Past Surgical History, Family History, and Social History were reviewed in [employer] record.  ROS  The following are not active complaints unless bolded Hoarseness, sore throat, dysphagia, dental problems, itching, sneezing,  nasal congestion or discharge of excess mucus or purulent secretions, ear ache,   fever, chills, sweats, unintended wt loss or wt gain, classically pleuritic or exertional cp,  orthopnea pnd or arm/hand swelling  or leg  swelling, presyncope, palpitations, abdominal pain, anorexia, nausea, vomiting, diarrhea  or change in bowel habits or change in bladder habits, change in stools or change in urine, dysuria, hematuria,  rash, arthralgias, visual complaints, headache, numbness, weakness or ataxia or problems with walking or coordination,  change in mood or  memory.        Current Meds  Medication Sig   albuterol  (VENTOLIN  HFA) 108 (90 Base) MCG/ACT inhaler Inhale 1-2 puffs into the lungs every 4 (four) hours as needed for wheezing or shortness of breath.   amLODipine  (NORVASC ) 5 MG tablet Take 5 mg by mouth daily.   aspirin 81 MG tablet Take 81 mg by mouth daily.   azithromycin  (ZITHROMAX ) 250 MG tablet Take 2 on day one then 1 daily x 4 days   Calcium  Carbonate-Vit D-Min (CALCIUM  1200) 1200-1000 MG-UNIT CHEW 1 tablet with a meal   CALCIUM  PO Take 1 tablet by mouth daily.   Cholecalciferol  (VITAMIN D3) 2000 UNITS TABS Take 2,000 Units by mouth daily.    docusate sodium  (COLACE) 100 MG capsule 1 capsule daily for constipation   losartan (COZAAR) 100 MG tablet Take 1 tablet by mouth daily.   Omega-3 Fatty Acids (FISH OIL CONCENTRATE) 1000 MG CAPS Take 1,000 mg by mouth daily.    potassium chloride  (K-DUR,KLOR-CON ) 10 MEQ tablet Take 10 mEq by mouth daily.   Probiotic Product (PROBIOTIC PO) Take 1 capsule by mouth daily.   tamoxifen  (NOLVADEX ) 20 MG tablet Take 1 tablet (20 mg total) by mouth daily.   vitamin E  180 MG (400 UNITS) capsule Take 400 Units by mouth daily.                  Objective:   Physical Exam   Wts  08/11/2024     145  07/20/2023       155  06/19/2022       155  06/01/2021       178  05/20/2020         180   01/21/2018         178   10/22/17 181 lb (82.1 kg)  03/06/17 169 lb (76.7 kg)  01/16/17 174 lb 6.4 oz (79.1 kg)      Vital signs reviewed  08/11/2024  - Note at rest 02 sats  99% on RA   General appearance:    amb pleasant bf all smiles   HEENT : Oropharynx  clear          NECK :  without  apparent JVD/ palpable Nodes/TM  LUNGS: no acc muscle use,  Nl contour chest  with a few scattered insp squeaks and exp rhonchi bilaterally    CV:  RRR  no s3 or murmur or increase in P2, and no edema   ABD:  soft and nontender   MS:  Gait nl   ext warm without deformities Or obvious joint restrictions  calf tenderness, cyanosis or clubbing    SKIN: warm and dry without lesions    NEURO:  alert, approp, nl sensorium with  no motor or cerebellar deficits apparent.     CXR PA and Lateral:   08/11/2024 :    I personally reviewed images and impression is as follows:     Mild coarse marking bilaterally but no overall change      Assessment:     Assessment & Plan Obstructive bronchiectasis (HCC) See CT chest 08/17/17  - Spirometry 10/22/2017  wnl s am saba but active exp  rhonchi on exam  - Quant Ig's wnl  - Allergy  profile 10/22/2017 >  Eos 0.1 /  IgE  45 RAST neg  - Quant TB 10/22/2017  Neg - Flutter valve 10/22/17  - PFT's  01/21/2018  FEV1 1.44 (90 % ) ratio 88  p no % improvement from saba p nothing prior to study with DLCO  60/62 % corrects to 89 % for alv volume   - PFT's  05/20/2020  FEV1 1.33 (86 % ) ratio 0.79  p 1 % improvement from saba p 0 prior to study with DLCO  18.73 (94%) corrects to 4.10 (139%)  for alv volume and FV curve min/mild concavity - 06/01/2021 cxr worse > HRCT rec  Not done  - HRCT chest  06/29/22  C/w bronchiectasis and low grade MAI  - 07/20/2023 Zpak prn flares   - cxr 08/11/2024 no overall change     No clinical or radiographic deterioration on prn cycles of zmax  so rec >>>> no change in Rx, f/u is appropriate yearly, call sooner if needed          Each maintenance medication was reviewed in detail including emphasizing most importantly the difference between maintenance and prns and under what circumstances the prns are to be triggered using an action plan format where appropriate.  Total time for H and P, chart review,  counseling,   and generating customized AVS unique to this office visit / same day charting = 25 min          AVS  Patient Instructions  For cough/ congestion > mucinex or mucinex dm  up to maximum of  1200 mg every 12 hours as needed  If mucus gets nasty > zpak refillable  Please remember to go to the  x-ray department  for your tests - we will call you with the results when they are available     Please schedule a follow up visit in 12 months but call sooner if needed    Ozell America, MD 08/11/2024

## 2024-08-11 NOTE — Patient Instructions (Signed)
 For cough/ congestion > mucinex or mucinex dm  up to maximum of  1200 mg every 12 hours as needed  If mucus gets nasty > zpak refillable  Please remember to go to the  x-ray department  for your tests - we will call you with the results when they are available     Please schedule a follow up visit in 12 months but call sooner if needed

## 2024-08-11 NOTE — Assessment & Plan Note (Addendum)
 See CT chest 08/17/17  - Spirometry 10/22/2017  wnl s am saba but active exp  rhonchi on exam  - Quant Ig's wnl  - Allergy  profile 10/22/2017 >  Eos 0.1 /  IgE  45 RAST neg  - Quant TB 10/22/2017  Neg - Flutter valve 10/22/17  - PFT's  01/21/2018  FEV1 1.44 (90 % ) ratio 88  p no % improvement from saba p nothing prior to study with DLCO  60/62 % corrects to 89 % for alv volume   - PFT's  05/20/2020  FEV1 1.33 (86 % ) ratio 0.79  p 1 % improvement from saba p 0 prior to study with DLCO  18.73 (94%) corrects to 4.10 (139%)  for alv volume and FV curve min/mild concavity - 06/01/2021 cxr worse > HRCT rec  Not done  - HRCT chest  06/29/22  C/w bronchiectasis and low grade MAI  - 07/20/2023 Zpak prn flares   - cxr 08/11/2024 no overall change     No clinical or radiographic deterioration on prn cycles of zmax  so rec >>>> no change in Rx, f/u is appropriate yearly, call sooner if needed          Each maintenance medication was reviewed in detail including emphasizing most importantly the difference between maintenance and prns and under what circumstances the prns are to be triggered using an action plan format where appropriate.  Total time for H and P, chart review, counseling,   and generating customized AVS unique to this office visit / same day charting = 25 min

## 2024-08-17 DIAGNOSIS — Z853 Personal history of malignant neoplasm of breast: Secondary | ICD-10-CM | POA: Diagnosis not present

## 2024-08-17 DIAGNOSIS — I700 Atherosclerosis of aorta: Secondary | ICD-10-CM | POA: Diagnosis not present

## 2024-08-17 DIAGNOSIS — I10 Essential (primary) hypertension: Secondary | ICD-10-CM | POA: Diagnosis not present

## 2024-08-17 DIAGNOSIS — J479 Bronchiectasis, uncomplicated: Secondary | ICD-10-CM | POA: Diagnosis not present

## 2024-08-18 ENCOUNTER — Ambulatory Visit: Payer: Self-pay | Admitting: Internal Medicine

## 2024-08-18 NOTE — Progress Notes (Signed)
 ATC x1. LVMTCB

## 2024-08-21 NOTE — Telephone Encounter (Signed)
 Copied from CRM #[id number]. Topic: General - Other >> Aug 18, 2024  4:50 PM Rilla B wrote: Reason for CRM: Daughter, Candis, returning call to [place]. Please call daughter [phone].   ATCx2 LVMTCB

## 2024-09-10 ENCOUNTER — Telehealth: Payer: Self-pay

## 2024-09-10 NOTE — Telephone Encounter (Signed)
 See result note encounter from 08/18/24:  Pt's daughter Candis Moles [facility]) returned cal. Dwan xray results. Ms. Moles verbalized understanding, NFN. Letter no longer mailed.      NFN

## 2024-09-10 NOTE — Telephone Encounter (Signed)
 Printed letter asking pt to return our call regarding her xray results. Mailed to pt. NFN.

## 2024-09-10 NOTE — Progress Notes (Signed)
 ATCx3 LVMTCB. Letter placed in outgoing mail. NFN.

## 2024-09-10 NOTE — Telephone Encounter (Signed)
 Pt's daughter Candis Moles [facility]) returned cal. Relayed xray results. Ms. Moles verbalized understanding, NFN. Letter no longer mailed.

## 2024-09-10 NOTE — Telephone Encounter (Signed)
 Patient daughter calling back concerning a call they received  about xray results.on the phone with ms channing to seeif anyone is available . Transferred to megan

## 2025-07-29 ENCOUNTER — Inpatient Hospital Stay: Admitting: Hematology and Oncology
